# Patient Record
Sex: Male | Born: 1935 | Race: White | Hispanic: No | Marital: Married | Smoking: Former smoker
Health system: Southern US, Community
[De-identification: ages and names within clinical notes are randomized; demographics above are authoritative.]

## PROBLEM LIST (undated history)

## (undated) DIAGNOSIS — F410 Panic disorder [episodic paroxysmal anxiety] without agoraphobia: Secondary | ICD-10-CM

## (undated) DIAGNOSIS — R55 Syncope and collapse: Secondary | ICD-10-CM

## (undated) DIAGNOSIS — N179 Acute kidney failure, unspecified: Secondary | ICD-10-CM

## (undated) DIAGNOSIS — C801 Malignant (primary) neoplasm, unspecified: Secondary | ICD-10-CM

## (undated) DIAGNOSIS — Z860100 Personal history of colon polyps, unspecified: Secondary | ICD-10-CM

## (undated) DIAGNOSIS — R339 Retention of urine, unspecified: Secondary | ICD-10-CM

## (undated) DIAGNOSIS — I513 Intracardiac thrombosis, not elsewhere classified: Secondary | ICD-10-CM

## (undated) DIAGNOSIS — D649 Anemia, unspecified: Secondary | ICD-10-CM

## (undated) DIAGNOSIS — I2510 Atherosclerotic heart disease of native coronary artery without angina pectoris: Secondary | ICD-10-CM

## (undated) DIAGNOSIS — I10 Essential (primary) hypertension: Secondary | ICD-10-CM

## (undated) DIAGNOSIS — N400 Enlarged prostate without lower urinary tract symptoms: Secondary | ICD-10-CM

## (undated) DIAGNOSIS — I5020 Unspecified systolic (congestive) heart failure: Secondary | ICD-10-CM

## (undated) DIAGNOSIS — E785 Hyperlipidemia, unspecified: Secondary | ICD-10-CM

## (undated) DIAGNOSIS — I4891 Unspecified atrial fibrillation: Principal | ICD-10-CM

## (undated) DIAGNOSIS — N281 Cyst of kidney, acquired: Secondary | ICD-10-CM

## (undated) HISTORY — DX: Anemia, unspecified: D64.9

## (undated) HISTORY — PX: CHOLECYSTECTOMY: SHX55

## (undated) HISTORY — DX: Syncope and collapse: R55

## (undated) HISTORY — DX: Hyperlipidemia, unspecified: E78.5

## (undated) HISTORY — DX: Panic disorder (episodic paroxysmal anxiety): F41.0

## (undated) HISTORY — DX: Personal history of colonic polyps: Z86.010

## (undated) HISTORY — DX: Malignant (primary) neoplasm, unspecified: C80.1

## (undated) HISTORY — DX: Essential (primary) hypertension: I10

## (undated) HISTORY — PX: TOTAL KNEE ARTHROPLASTY: SHX125

## (undated) HISTORY — DX: Personal history of colon polyps, unspecified: Z86.0100

## (undated) HISTORY — DX: Benign prostatic hyperplasia without lower urinary tract symptoms: N40.0

---

## 2005-08-21 HISTORY — PX: COLONOSCOPY W/ POLYPECTOMY: SHX1380

## 2005-09-07 ENCOUNTER — Ambulatory Visit: Payer: Self-pay | Admitting: Internal Medicine

## 2005-09-22 ENCOUNTER — Ambulatory Visit: Payer: Self-pay | Admitting: Internal Medicine

## 2005-09-28 ENCOUNTER — Ambulatory Visit: Payer: Self-pay | Admitting: Internal Medicine

## 2005-10-05 ENCOUNTER — Ambulatory Visit: Payer: Self-pay | Admitting: Internal Medicine

## 2005-10-10 ENCOUNTER — Ambulatory Visit: Payer: Self-pay | Admitting: Gastroenterology

## 2005-11-01 ENCOUNTER — Ambulatory Visit: Payer: Self-pay | Admitting: Gastroenterology

## 2005-11-01 ENCOUNTER — Encounter (INDEPENDENT_AMBULATORY_CARE_PROVIDER_SITE_OTHER): Payer: Self-pay | Admitting: Specialist

## 2005-11-03 ENCOUNTER — Ambulatory Visit: Payer: Self-pay | Admitting: Internal Medicine

## 2005-12-22 ENCOUNTER — Ambulatory Visit: Payer: Self-pay | Admitting: Gastroenterology

## 2006-02-07 ENCOUNTER — Ambulatory Visit: Payer: Self-pay | Admitting: Gastroenterology

## 2006-02-28 ENCOUNTER — Ambulatory Visit: Payer: Self-pay | Admitting: Internal Medicine

## 2006-03-29 ENCOUNTER — Ambulatory Visit: Payer: Self-pay | Admitting: Internal Medicine

## 2006-06-04 ENCOUNTER — Ambulatory Visit: Payer: Self-pay | Admitting: Internal Medicine

## 2006-06-04 LAB — CONVERTED CEMR LAB
Basophils Relative: 0.5 % (ref 0.0–1.0)
MCHC: 33.3 g/dL (ref 30.0–36.0)
MCV: 89.1 fL (ref 78.0–100.0)
Monocytes Absolute: 0.1 10*3/uL — ABNORMAL LOW (ref 0.2–0.7)
Neutro Abs: 5.9 10*3/uL (ref 1.4–7.7)
RBC: 4.61 M/uL (ref 4.22–5.81)

## 2006-10-19 DIAGNOSIS — E785 Hyperlipidemia, unspecified: Secondary | ICD-10-CM

## 2006-10-19 DIAGNOSIS — Z860100 Personal history of colon polyps, unspecified: Secondary | ICD-10-CM | POA: Insufficient documentation

## 2006-10-19 DIAGNOSIS — I10 Essential (primary) hypertension: Secondary | ICD-10-CM | POA: Insufficient documentation

## 2006-10-19 DIAGNOSIS — D649 Anemia, unspecified: Secondary | ICD-10-CM | POA: Insufficient documentation

## 2006-10-22 ENCOUNTER — Inpatient Hospital Stay (HOSPITAL_COMMUNITY): Admission: EM | Admit: 2006-10-22 | Discharge: 2006-10-30 | Payer: Self-pay | Admitting: Emergency Medicine

## 2006-10-23 ENCOUNTER — Ambulatory Visit: Payer: Self-pay | Admitting: Internal Medicine

## 2006-10-23 ENCOUNTER — Encounter (INDEPENDENT_AMBULATORY_CARE_PROVIDER_SITE_OTHER): Payer: Self-pay | Admitting: Specialist

## 2006-11-20 ENCOUNTER — Ambulatory Visit: Payer: Self-pay | Admitting: Internal Medicine

## 2006-11-20 LAB — CONVERTED CEMR LAB
Basophils Absolute: 0.1 10*3/uL (ref 0.0–0.1)
Creatinine, Ser: 0.9 mg/dL (ref 0.4–1.5)
Eosinophils Absolute: 0.2 10*3/uL (ref 0.0–0.6)
Eosinophils Relative: 2.1 % (ref 0.0–5.0)
HCT: 36.6 % — ABNORMAL LOW (ref 39.0–52.0)
Hemoglobin: 12.4 g/dL — ABNORMAL LOW (ref 13.0–17.0)
Monocytes Absolute: 1 10*3/uL — ABNORMAL HIGH (ref 0.2–0.7)
Neutrophils Relative %: 76.1 % (ref 43.0–77.0)
Potassium: 3.7 meq/L (ref 3.5–5.1)
Vitamin B-12: 349 pg/mL (ref 211–911)

## 2007-02-04 ENCOUNTER — Telehealth: Payer: Self-pay | Admitting: Internal Medicine

## 2007-09-23 ENCOUNTER — Telehealth: Payer: Self-pay | Admitting: Internal Medicine

## 2008-08-21 HISTORY — PX: MOHS SURGERY: SUR867

## 2009-01-01 ENCOUNTER — Telehealth (INDEPENDENT_AMBULATORY_CARE_PROVIDER_SITE_OTHER): Payer: Self-pay | Admitting: *Deleted

## 2009-01-01 ENCOUNTER — Ambulatory Visit: Payer: Self-pay | Admitting: Internal Medicine

## 2009-01-14 LAB — CONVERTED CEMR LAB
BUN: 17 mg/dL (ref 6–23)
CO2: 32 meq/L (ref 19–32)
Chloride: 109 meq/L (ref 96–112)
GFR calc non Af Amer: 76 mL/min (ref >60)
LDL Cholesterol: 94 mg/dL (ref 0–99)
Sodium: 144 meq/L (ref 135–145)

## 2009-01-15 ENCOUNTER — Encounter (INDEPENDENT_AMBULATORY_CARE_PROVIDER_SITE_OTHER): Payer: Self-pay | Admitting: *Deleted

## 2009-02-03 ENCOUNTER — Ambulatory Visit: Payer: Self-pay | Admitting: Internal Medicine

## 2009-02-03 DIAGNOSIS — R7309 Other abnormal glucose: Secondary | ICD-10-CM | POA: Insufficient documentation

## 2009-02-03 DIAGNOSIS — Z85828 Personal history of other malignant neoplasm of skin: Secondary | ICD-10-CM

## 2009-02-03 DIAGNOSIS — R5383 Other fatigue: Secondary | ICD-10-CM

## 2009-02-03 DIAGNOSIS — R5381 Other malaise: Secondary | ICD-10-CM

## 2009-02-07 LAB — CONVERTED CEMR LAB
Basophils Absolute: 0 10*3/uL (ref 0.0–0.1)
Basophils Relative: 0.2 % (ref 0.0–3.0)
HCT: 40.4 % (ref 39.0–52.0)
Hemoglobin: 13.9 g/dL (ref 13.0–17.0)
Lymphs Abs: 1 10*3/uL (ref 0.7–4.0)
Neutro Abs: 6.1 10*3/uL (ref 1.4–7.7)
Platelets: 145 10*3/uL — ABNORMAL LOW (ref 150.0–400.0)
RBC: 4.52 M/uL (ref 4.22–5.81)
Transferrin: 215 mg/dL (ref 212.0–360.0)
Vitamin B-12: 300 pg/mL (ref 211–911)
WBC: 7.8 10*3/uL (ref 4.5–10.5)

## 2009-02-08 ENCOUNTER — Encounter (INDEPENDENT_AMBULATORY_CARE_PROVIDER_SITE_OTHER): Payer: Self-pay | Admitting: *Deleted

## 2010-01-04 ENCOUNTER — Telehealth (INDEPENDENT_AMBULATORY_CARE_PROVIDER_SITE_OTHER): Payer: Self-pay | Admitting: *Deleted

## 2010-03-08 ENCOUNTER — Ambulatory Visit: Payer: Self-pay | Admitting: Internal Medicine

## 2010-03-08 DIAGNOSIS — R42 Dizziness and giddiness: Secondary | ICD-10-CM

## 2010-03-08 DIAGNOSIS — R279 Unspecified lack of coordination: Secondary | ICD-10-CM | POA: Insufficient documentation

## 2010-03-08 DIAGNOSIS — F29 Unspecified psychosis not due to a substance or known physiological condition: Secondary | ICD-10-CM | POA: Insufficient documentation

## 2010-03-09 ENCOUNTER — Ambulatory Visit: Payer: Self-pay | Admitting: Internal Medicine

## 2010-03-11 ENCOUNTER — Encounter: Payer: Self-pay | Admitting: Internal Medicine

## 2010-03-11 LAB — CONVERTED CEMR LAB
AST: 18 units/L (ref 0–37)
Alkaline Phosphatase: 71 units/L (ref 39–117)
BUN: 13 mg/dL (ref 6–23)
Basophils Relative: 0.3 % (ref 0.0–3.0)
CO2: 26 meq/L (ref 19–32)
Chloride: 109 meq/L (ref 96–112)
Eosinophils Absolute: 0.1 10*3/uL (ref 0.0–0.7)
Eosinophils Relative: 1.1 % (ref 0.0–5.0)
Glucose, Bld: 139 mg/dL — ABNORMAL HIGH (ref 70–99)
HDL: 33.5 mg/dL — ABNORMAL LOW (ref >39.00)
Lymphocytes Relative: 12.6 % (ref 12.0–46.0)
Lymphs Abs: 0.8 10*3/uL (ref 0.7–4.0)
Monocytes Absolute: 0.5 10*3/uL (ref 0.1–1.0)
Neutro Abs: 5 10*3/uL (ref 1.4–7.7)
Neutrophils Relative %: 78.9 % — ABNORMAL HIGH (ref 43.0–77.0)
Platelets: 150 10*3/uL (ref 150.0–400.0)
Triglycerides: 115 mg/dL (ref 0.0–149.0)
VLDL: 23 mg/dL (ref 0.0–40.0)
WBC: 6.4 10*3/uL (ref 4.5–10.5)

## 2010-03-21 ENCOUNTER — Telehealth (INDEPENDENT_AMBULATORY_CARE_PROVIDER_SITE_OTHER): Payer: Self-pay | Admitting: *Deleted

## 2010-03-28 ENCOUNTER — Encounter: Payer: Self-pay | Admitting: Internal Medicine

## 2010-03-29 ENCOUNTER — Encounter: Payer: Self-pay | Admitting: Cardiovascular Disease

## 2010-03-29 ENCOUNTER — Ambulatory Visit: Payer: Self-pay | Admitting: Cardiology

## 2010-03-29 ENCOUNTER — Ambulatory Visit: Payer: Self-pay

## 2010-03-29 ENCOUNTER — Ambulatory Visit (HOSPITAL_COMMUNITY): Admission: RE | Admit: 2010-03-29 | Discharge: 2010-03-29 | Payer: Self-pay | Admitting: Internal Medicine

## 2010-04-18 ENCOUNTER — Telehealth (INDEPENDENT_AMBULATORY_CARE_PROVIDER_SITE_OTHER): Payer: Self-pay | Admitting: *Deleted

## 2010-04-19 ENCOUNTER — Ambulatory Visit: Payer: Self-pay | Admitting: Internal Medicine

## 2010-04-19 DIAGNOSIS — R9431 Abnormal electrocardiogram [ECG] [EKG]: Secondary | ICD-10-CM

## 2010-05-04 ENCOUNTER — Telehealth (INDEPENDENT_AMBULATORY_CARE_PROVIDER_SITE_OTHER): Payer: Self-pay | Admitting: *Deleted

## 2010-05-13 ENCOUNTER — Ambulatory Visit: Payer: Self-pay | Admitting: Cardiology

## 2010-05-19 ENCOUNTER — Telehealth: Payer: Self-pay | Admitting: Cardiology

## 2010-05-20 ENCOUNTER — Telehealth (INDEPENDENT_AMBULATORY_CARE_PROVIDER_SITE_OTHER): Payer: Self-pay | Admitting: *Deleted

## 2010-05-23 ENCOUNTER — Telehealth: Payer: Self-pay | Admitting: Cardiology

## 2010-05-26 ENCOUNTER — Ambulatory Visit: Payer: Self-pay | Admitting: Cardiology

## 2010-05-31 ENCOUNTER — Telehealth (INDEPENDENT_AMBULATORY_CARE_PROVIDER_SITE_OTHER): Payer: Self-pay | Admitting: *Deleted

## 2010-06-20 ENCOUNTER — Telehealth: Payer: Self-pay | Admitting: Cardiology

## 2010-06-21 ENCOUNTER — Ambulatory Visit: Payer: Self-pay | Admitting: Cardiology

## 2010-06-23 ENCOUNTER — Telehealth (INDEPENDENT_AMBULATORY_CARE_PROVIDER_SITE_OTHER): Payer: Self-pay | Admitting: *Deleted

## 2010-06-27 ENCOUNTER — Ambulatory Visit: Payer: Self-pay | Admitting: Internal Medicine

## 2010-06-27 DIAGNOSIS — R351 Nocturia: Secondary | ICD-10-CM

## 2010-06-27 DIAGNOSIS — G4700 Insomnia, unspecified: Secondary | ICD-10-CM | POA: Insufficient documentation

## 2010-06-29 ENCOUNTER — Telehealth: Payer: Self-pay | Admitting: Internal Medicine

## 2010-07-04 ENCOUNTER — Ambulatory Visit: Payer: Self-pay | Admitting: Internal Medicine

## 2010-07-04 DIAGNOSIS — G252 Other specified forms of tremor: Secondary | ICD-10-CM | POA: Insufficient documentation

## 2010-07-04 DIAGNOSIS — F329 Major depressive disorder, single episode, unspecified: Secondary | ICD-10-CM

## 2010-07-04 DIAGNOSIS — F3289 Other specified depressive episodes: Secondary | ICD-10-CM | POA: Insufficient documentation

## 2010-07-04 DIAGNOSIS — G250 Essential tremor: Secondary | ICD-10-CM

## 2010-07-22 ENCOUNTER — Encounter: Payer: Self-pay | Admitting: Internal Medicine

## 2010-07-29 ENCOUNTER — Telehealth: Payer: Self-pay | Admitting: Internal Medicine

## 2010-08-06 ENCOUNTER — Emergency Department (HOSPITAL_COMMUNITY)
Admission: EM | Admit: 2010-08-06 | Discharge: 2010-08-06 | Payer: Self-pay | Source: Home / Self Care | Admitting: Emergency Medicine

## 2010-08-11 ENCOUNTER — Ambulatory Visit: Payer: Self-pay | Admitting: Internal Medicine

## 2010-08-18 ENCOUNTER — Telehealth (INDEPENDENT_AMBULATORY_CARE_PROVIDER_SITE_OTHER): Payer: Self-pay | Admitting: *Deleted

## 2010-08-18 ENCOUNTER — Encounter: Payer: Self-pay | Admitting: Internal Medicine

## 2010-08-30 ENCOUNTER — Encounter: Payer: Self-pay | Admitting: Internal Medicine

## 2010-08-31 ENCOUNTER — Ambulatory Visit
Admission: RE | Admit: 2010-08-31 | Discharge: 2010-08-31 | Payer: Self-pay | Source: Home / Self Care | Attending: Internal Medicine | Admitting: Internal Medicine

## 2010-08-31 ENCOUNTER — Encounter: Payer: Self-pay | Admitting: Internal Medicine

## 2010-09-07 ENCOUNTER — Encounter: Payer: Self-pay | Admitting: Internal Medicine

## 2010-09-22 ENCOUNTER — Telehealth (INDEPENDENT_AMBULATORY_CARE_PROVIDER_SITE_OTHER): Payer: Self-pay | Admitting: *Deleted

## 2010-09-22 NOTE — Progress Notes (Signed)
Summary: Note with Patient Concerns  Note with Patient Concerns   Imported By: Lanelle Bal 09/07/2010 15:04:46  _____________________________________________________________________  External Attachment:    Type:   Image     Comment:   External Document

## 2010-09-22 NOTE — Progress Notes (Signed)
Summary: c/o pain in feet   Phone Note Call from Patient Call back at Home Phone [phone]   Caller: Spouse Reason for Call: Talk to Nurse Summary of Call: per pt calling c/o compression stocking is making matter worse in his feet. pain in his feet.  Initial call taken by: Lorne Skeens,  June 20, 2010 2:02 PM  Follow-up for Phone Call        talked with wife--pt has been using compression stockings for the past 2 days--pt has a history of neuropathy and had some pain in his feet --since he has been using the stockings the pain in his feet has been worse --wife states no change in lightheadedness or weakness--I will review with Dr Graylin Shiver Lankford,RN      Appended Document: c/o pain in feet ok to stop compression stockings given pain.   Appended Document: c/o pain in feet LMTCB   Appended Document: c/o pain in feet discussed with wife by telephone

## 2010-09-22 NOTE — Letter (Signed)
Summary: [facility]  [facility]   Imported By: Lanelle Bal 09/08/2010 11:18:06  _____________________________________________________________________  External Attachment:    Type:   Image     Comment:   External Document

## 2010-09-22 NOTE — Progress Notes (Signed)
Summary: event monitor  Phone Note Outgoing Call Call back at [facility] Phone [phone]   Call placed by: Stanton Kidney, EMT-P,  May 20, 2010 1:50 PM Call placed to: Patient Action Taken: Phone Call Completed Summary of Call: Pt scheduled for event monitor 05/26/10 10am

## 2010-09-22 NOTE — Assessment & Plan Note (Signed)
Summary: np6/ dizziness, guddiness, abn ekg. pt has secure horizone/ gd  Medications Added ASPIRIN 81 MG TBEC (ASPIRIN) Take one tablet by mouth daily GINKGO BILOBA 120 MG CAPS (GINKGO BILOBA) 1 tab every other day RA ARTHRITIS PAIN RELIEF 650 MG CR-TABS (ACETAMINOPHEN) 1 tab by mouth at bedtime TRAZODONE HCL 50 MG TABS (TRAZODONE HCL) take one at bedtime as needed for sleep        Primary Avish Torry:  Dr. Alwyn Ren  CC:  dizziness.  History of Present Illness: 77 yo with history of anxiety presents for evaluation of presyncope.  Patient has had 2 severe episodes. The first occurred on 7/19.  Patient was walking in the mall, came home for lunch, and felt warm/weak and lightheaded while standing in his house.  He sat down and the feeling passed (no syncope).  On 8/23, he was in the mall walking with his wife.  He felt warm and weak, then dizzy/lightheaded.  He had to lie down on the floor and the feeling passed.  No palpitations or sensation of heart racing with either episode.  Since the last episode, he has been afraid to drive or walk in the mall.  It has significantly affected his quality of life.   Patient also reports recently starting to take a second Klonopin tab at night to help him sleep in addition to the one he takes in the morning.  He now feels unsteady and somewhat lightheaded in the morning, which he attributes to the extra Klonopin.  However, he thinks it helps him sleep.  Patient's mood seems depressed.   Patient denies exertional dyspnea or chest pain.  He had been walking in the mall for about 1/2 hour 4-5 times a week with no problem.    We checked his orthostatics today.  His BP dropped significantly when he first stood but then recovered quickly at 2 and 5 minutes.    ECG: NSR, normal  Labs (7/11): K 3.7, creatinine 0.8, HCT 38.3, LDL 101, HDL 33.5, TGs 115, A1c 5.8%  Current Medications (verified): 1)  Doxazosin Mesylate 2 Mg Tabs (Doxazosin Mesylate) .... Take 1/2  Tablet By Mouth Every Day At Bedtime 2)  Clonazepam 0.5 Mg  Tabs (Clonazepam) .[name].. 1 By Mouth Once Daily Prn 3)  Aspirin 81 Mg Tbec (Aspirin) .... Take One Tablet By Mouth Daily 4)  Ginkgo Biloba 120 Mg Caps (Ginkgo Biloba) .[name].. 1 Tab Every Other Day 5)  Ra Arthritis Pain Relief 650 Mg Cr-Tabs (Acetaminophen) .[name].. 1 Tab By Mouth At Bedtime  Allergies: No Known Drug Allergies  Past History:  Past Medical History: 1. Anemia-NOS, PMH of  2. Colonic polyps, hx of 3. Hyperlipidemia 4. Hypertension 5. Skin cancer, hx of, Basal cell  2010, 1989 6. Right TKR 7. BPH 8. Panic attacks/generalized anxiety 9. Presyncope: Echo (8/11) with EF 60%, normal RV size and systolic function, no significant valvular abnormalities.  Carotid dopplers (8/11): minimal carotid stenosis.   Family History: Reviewed history from 02/03/2009 and no changes required. Father: MI @ 30,  CHF S/P TURP @ 38 Mother: d 1 Siblings: bro d 46 CVAs; bro d 29 CAD; bro d 46 lung CA  Social History: Reviewed history from 02/03/2009 and no changes required. Retired Married Former Smoker:quit 1985 Alcohol use-yes:socially Regular exercise-yes: walks 15 min 4-5X/week  Review of Systems       All systems reviewed and negative except as per HPI.   Vital Signs:  Patient profile:   77 year old male Height:  72 inches Weight:      212 pounds BMI:     28.86 Pulse rate:   77 / minute Pulse (ortho):   93 / minute Resp:     16 per minute BP sitting:   151 / 76  (right arm) BP standing:   118 / 68  Vitals Entered By: Kem Parkinson (May 13, 2010 12:07 PM)  Serial Vital Signs/Assessments:  Time      Position  BP       Pulse  Resp  Temp     By           Lying RA  151/76   77                    Kimalexis Barnes           Sitting   149/72   79                    Kimalexis Barnes           Standing  118/68   93                    Kimalexis Barnes   Physical Exam  General:  Well developed, well nourished,  in no acute distress. Head:  normocephalic and atraumatic Nose:  no deformity, discharge, inflammation, or lesions Mouth:  Teeth, gums and palate normal. Oral mucosa normal. Neck:  Neck supple, no JVD. No masses, thyromegaly or abnormal cervical nodes. Lungs:  Clear bilaterally to auscultation and percussion. Heart:  Non-displaced PMI, chest non-tender; regular rate and rhythm, S1, S2 without rubs or gallops. 1/6 SEM upper sternal border.  Carotid upstroke normal, no bruit. Pedals normal pulses. 1+ ankle edema.  Abdomen:  Bowel sounds positive; abdomen soft and non-tender without masses, organomegaly, or hernias noted. No hepatosplenomegaly. Msk:  s/p right TKR Extremities:  No clubbing or cyanosis. Neurologic:  Alert and oriented x 3. Psych:  depressed affect and anxious.     Impression & Recommendations:  Problem # 1:  PRESYNCOPE Patient had 2 episodes of presyncope about a month apart.  Both had a prodrome of warmth/fatigue proceeding to lightheadedness. No palpitations or sensation of racing heart rate.  Each time patient was standing.  He did have hypotension when he initially stood today, but it resolved quickly.  DIfferential for episode includes dehydration/orthostasis, vasovagal, and arrhythmic.  Echo showed no etiology.  I am going to have him wear knee high compression stockings to avoid future orthostatic symptoms.  I will also set him up for a 3-week event monitor to look for arrhythmic events.    Problem # 2:  FATIGUE (ICD-780.79) Patient has been fatigued, groggy, and lightheaded in the morning after taking an extra Klonopin at night. I will have him stop the pm Klonopin and instead try trazodone 50 mg daily.  This is a weak antidepressant and may also help with his mood (has been depressed/anxious).    Other Orders: Event (Event)  Patient Instructions: 1)  Your physician has recommended you make the following change in your medication:  2)  Take Trazodone 50mg  at bedtime  for sleep. 3)  Do not take Clonazepam  at bedtime. 4)  You have a prescription for knee high compression stockings. [facility]  [address]  [place] [phone] 5)  Your physician has recommended that you wear an event monitor.  Event monitors are medical devices that record the heart's electrical activity.  Doctors most often use these monitors to diagnose arrhythmias. Arrhythmias are problems with the speed or rhythm of the heartbeat. The monitor is a small, portable device. You can wear one while you do your normal daily activities. This is usually used to diagnose what is causing palpitations/syncope (passing out). 3 week event monitor 6)  Your physician recommends that you schedule a follow-up appointment in: 4-5 weeks after you have completed the monitor. Prescriptions: TRAZODONE HCL 50 MG TABS (TRAZODONE HCL) take one at bedtime as needed for sleep  #30 x 6   Entered by:   Katina Dung, RN, BSN   Authorized by:   [name], MD   Signed by:   Katina Dung, RN, BSN on 05/13/2010   Method used:   [facility]        [facility]  [address] [phone][facility] (retail)       [address]       [place], [place]  [address]       Ph: [phone] or [phone]       Fax: [phone]   RxID:   [id number]

## 2010-09-22 NOTE — Assessment & Plan Note (Signed)
Summary: unable to sleep.cbs   Vital Signs:  Patient profile:   77 year old male Weight:      207 pounds BMI:     28.18 Pulse rate:   72 / minute Resp:     18 per minute BP sitting:   130 / 72  (left arm) Cuff size:   regular  Vitals Entered By: Shonna Chock CMA (July 04, 2010 11:20 AM) CC: Patient stopped Fluoxetine and stated " I had the worse weekend of my life" patient became very depressed while taking med , Depressive symptoms   Primary Care Provider:  Dr. Alwyn Ren  CC:  Patient stopped Fluoxetine and stated " I had the worse weekend of my life" patient became very depressed while taking med  and Depressive symptoms.  History of Present Illness: Depressive Symptoms      This is an 77 year old man who presents with Depressive symptoms.  The patient reports depressed mood, loss of interest/pleasure, insomnia, and psychomotor retardation.  The patient also reports fatigue or loss of energy and thoughts of suicide.  The patient denies feelings of worthlessness, diminished concentration, indecisiveness, and suicidal plans.  The patient reports the following psychosocial stressors: recent traumatic event   (his wife was in MVA ) and major life changes( he was stressed by Cardiology evaluation).  Patient's past history includes depression  in 1965 due to work stress, he saw Dr Levon Hedger > 1 year.He was on Valium & ? In 1996 he was depressed after knee surgery ; he took Prozac X 2 months.His brother had  alcoholism.  The patient denies abnormally elevated mood, abnormally irritable mood, and flight of ideas.  He fills Fluoxetine caused depression & panic.   Current Medications (verified): 1)  Doxazosin Mesylate 2 Mg Tabs (Doxazosin Mesylate) .... Take 1/2 Tablet By Mouth Every Day At Bedtime 2)  Clonazepam 0.5 Mg  Tabs (Clonazepam) .[name].. 1- 2  By Mouth  As Needed 3)  Aspirin 81 Mg Tbec (Aspirin) .... Take One Tablet By Mouth Daily 4)  Ginkgo Biloba 120 Mg Caps (Ginkgo Biloba) .[name].. 1 Tab  Every Other Day 5)  Ra Arthritis Pain Relief 650 Mg Cr-Tabs (Acetaminophen) .[name].. 1 Tab By Mouth At Bedtime  Allergies (verified): 1)  ! Fluoxetine Hcl (Fluoxetine Hcl)  Physical Exam  General:  Anxious but in no acute distress; cooperative throughout examination Neck:  No deformities, masses, or tenderness noted. Heart:  normal rate, regular rhythm, no gallop, no rub, no JVD, and grade 1 /6 systolic murmur.   Neurologic:  Oriented X3.  Marked tremor with writing. DTRs WNL UE Psych:  memory intact for recent and remote but  flat affect, and subdued.   Bipolar screening test answered negative to all 13 questions.MMSE : 29 out of 30. Clock test had "Xs" @ 10 & 20 , but no hands   Impression & Recommendations:  Problem # 1:  INSOMNIA (ICD-780.52)  Problem # 2:  DEPRESSION, RECURRENT (ICD-311)  The following medications were removed from the medication list:    Fluoxetine Hcl 10 Mg Caps (Fluoxetine hcl) .[name]... 1 once daily His updated medication list for this problem includes:    Clonazepam 0.5 Mg Tabs (Clonazepam) .[name]... 1 each am & 1-2 at bedtime as needed  Orders: Venipuncture ([address]) TLB-TSH (Thyroid Stimulating Hormone) (84443-TSH)  Problem # 3:  INTENTION TREMOR (ICD-333.1)  Complete Medication List: 1)  Doxazosin Mesylate 2 Mg Tabs (Doxazosin mesylate) .... Take 1/2 tablet by mouth every day at bedtime 2)  Clonazepam 0.5 Mg Tabs (Clonazepam) .[name].. 1 each am & 1-2 at bedtime as needed 3)  Aspirin 81 Mg Tbec (Aspirin) .... Take one tablet by mouth daily 4)  Ginkgo Biloba 120 Mg Caps (Ginkgo biloba) .[name].. 1 tab every other day 5)  Ra Arthritis Pain Relief 650 Mg Cr-tabs (Acetaminophen) .[name].. 1 tab by mouth at bedtime  Patient Instructions: 1)  If you have suicidal thoughts please go to [facility] ER for evlaution by ACT Team. Prescriptions: CLONAZEPAM 0.5 MG  TABS (CLONAZEPAM) 1 each am & 1-2 at bedtime as needed  #90 x 0   Entered and Authorized by:   Marga Melnick MD    Signed by:   Marga Melnick MD on 07/04/2010   Method used:   Print then Give to Patient   RxID:   [id number]    Orders Added: 1)  Est. Patient Level IV [14782] 2)  Venipuncture [95621] 3)  TLB-TSH (Thyroid Stimulating Hormone) [30865-HQI]

## 2010-09-22 NOTE — Progress Notes (Signed)
Summary: CLONAZEPAM REFILL  Phone Note Refill Request Message from:  Fax from Pharmacy on May 31, 2010 12:26 PM  Refills Requested: Medication #1:  CLONAZEPAM 0.5 MG  TABS 1 by mouth once daily prn   [facility] [place] [place]     [place] [phone]  Initial call taken by: Jerolyn Shin,  May 31, 2010 12:26 PM    Prescriptions: CLONAZEPAM 0.5 MG  TABS (CLONAZEPAM) 1 by mouth once daily prn  #60 x 0   Entered by:   Shonna Chock CMA   Authorized by:   Marga Melnick MD   Signed by:   Shonna Chock CMA on 05/31/2010   Method used:   Printed then faxed to ...       [facility]  [address] [phone]* (retail)       [address]       [place], [place]  [address]       Ph: [phone] or [phone]       Fax: [phone]   RxID:   [id number]

## 2010-09-22 NOTE — Assessment & Plan Note (Signed)
Summary: dizzy spells//lch   Vital Signs:  Patient profile:   77 year old male Weight:      207.6 pounds Temp:     98.4 degrees F oral Pulse rate:   80 / minute Resp:     17 per minute BP sitting:   142 / 80  (left arm) Cuff size:   large  Vitals Entered By: Shonna Chock CMA (April 19, 2010 11:22 AM) CC: 1.) Dizzy Spells (ongoing since 03/08/10), patient seen Cardiology and everything checked ok  2.) Major issus with sleep, Syncope   CC:  1.) Dizzy Spells (ongoing since 03/08/10), patient seen Cardiology and everything checked ok  2.) Major issus with sleep, and Syncope.  History of Present Illness:      This is an 77 year old man who presents with  paroxysmal lightheadedness.  The patient reports lightheadedness, but denies loss of consciousness, premonitory symptoms, near loss of consciousness, palpitations, chest pain, shortness of breath, seizures, and incontinence.  Associated symptoms include focal weakness, "my L knee buckled".  The patient denies the following symptoms: headache, abdominal discomfort, nausea, vomiting, feeling warm, pallor, diaphoresis, blurred vision, perioral numbness, bite injury of tongue, and witnessed confusion as symptoms resolved after sitting bench for < 5 min.   The patient reports the following precipitating factors: none noted.  The lighthreadedness  occurred in the setting of  walking through the mall. The episode 03/08/2010 was associated with feeling warm, fatigue & weakness R thumb & index finger for 3-5 min w/o lightheadedness. He had 2D ECHO & carotid Dopplers after  the episode  in 02/2010. Rx: 4  baby ASA once daily .  Current Medications (verified): 1)  Doxazosin Mesylate 2 Mg Tabs (Doxazosin Mesylate) .... Take 1/2 Tablet By Mouth Every Day At Bedtime 2)  Clonazepam 0.5 Mg  Tabs (Clonazepam) .[name].. 1 By Mouth Once Daily Prn 3)  Benadryl 25 Mg Tabs (Diphenhydramine Hcl) .[name].. 1 By Mouth At Bedtime  Allergies (verified): No Known Drug  Allergies  Review of Systems General:  Complains of sleep disorder; Using OTC sleep aid. Neuro:  Denies brief paralysis, inability to speak, sensation of room spinning, and tremors. Psych:  Complains of anxiety; Anxiety since event in 02/2010; he has quit driving due to this .  Physical Exam  General:  Appears younger than age,in no acute distress; alert,appropriate and cooperative throughout examination Eyes:  No corneal or conjunctival inflammation noted. EOMI. Perrla. Field of Vision grossly normal. Mouth:  Oral mucosa and oropharynx without lesions or exudates.  Teeth in good repair. No tongue deviation Lungs:  Normal respiratory effort, chest expands symmetrically. Lungs are clear to auscultation, no crackles or wheezes. Heart:  normal rate, regular rhythm, no murmur, no gallop, no rub, and no JVD.   Extremities:  Brace on L knee Neurologic:  alert & oriented X3, cranial nerves II-XII intact, strength normal in all extremities, sensation intact to light touch over face , gait  broad & cane employed, DTRs symmetrical  but )+ @ kneesl, finger-to-nose normal, and Romberg negative except for intentention  tremor   Skin:  Solar & keratotic changes Psych:  Oriented X3, memory intact for recent and remote, normally interactive, good eye contact, not anxious appearing, and not depressed appearing.     Impression & Recommendations:  Problem # 1:  DIZZINESS AND GIDDINESS (ICD-780.4)  His updated medication list for this problem includes:    Benadryl 25 Mg Tabs (Diphenhydramine hcl) .[name]... 1 by mouth at bedtime  Orders: Cardiology  Referral (Cardiology)  Problem # 2:  ABNORMAL ELECTROCARDIOGRAM (ICD-794.31)  R/O ectopic SVT as etiology of paroxysmal events  Orders: Cardiology Referral (Cardiology)  Complete Medication List: 1)  Doxazosin Mesylate 2 Mg Tabs (Doxazosin mesylate) .... Take 1/2 tablet by mouth every day at bedtime 2)  Clonazepam 0.5 Mg Tabs (Clonazepam) .[name].. 1 by mouth  once daily prn 3)  Benadryl 25 Mg Tabs (Diphenhydramine hcl) .[name].. 1 by mouth at bedtime  Patient Instructions: 1)  Clonazepam 0.5 mg 1-2 at bedtime as needed .

## 2010-09-22 NOTE — Assessment & Plan Note (Signed)
Summary: HAVING TROUBLE SLEEPING/KN   Vital Signs:  Patient profile:   77 year old male Weight:      200.6 pounds BMI:     27.30 Temp:     98.2 degrees F oral Pulse rate:   72 / minute Resp:     15 per minute BP sitting:   134 / 78  (left arm) Cuff size:   regular  Vitals Entered By: Shonna Chock CMA (August 11, 2010 3:14 PM) CC: 1.) Sleep concerns   2.) Mild depression , Insomnia, Depressive symptoms   Primary Care Provider:  Dr. Alwyn Ren  CC:  1.) Sleep concerns   2.) Mild depression , Insomnia, and Depressive symptoms.  History of Present Illness: Insomnia      This is an 77 year old man who presents with Insomnia.  The patient reports frequent awakening due to nocturia . This was 3-4X / night ;it decreased to 2-3 X with meds but it caused constipation.He  denies difficulty falling asleep, nightmares, leg movements, snoring, and apnea noted by partner.   Depressive Symptoms      The patient also presents with Depressive symptoms.  The patient reports depressed mood and loss of interest/pleasure.  The patient denies abnormally elevated mood and abnormally irritable mood.    Current Medications (verified): 1)  Doxazosin Mesylate 2 Mg Tabs (Doxazosin Mesylate) .... Take 1/2 Tablet By Mouth Every Day At Bedtime 2)  Clonazepam 0.5 Mg  Tabs (Clonazepam) .[name].. 1 Each Am & 1-2 At Bedtime As Needed 3)  Aspirin 81 Mg Tbec (Aspirin) .... Take One Tablet By Mouth Daily 4)  Ginkgo Biloba 120 Mg Caps (Ginkgo Biloba) .[name].. 1 Tab Every Other Day 5)  Ra Arthritis Pain Relief 650 Mg Cr-Tabs (Acetaminophen) .[name].. 1 Tab By Mouth At Bedtime  Allergies: 1)  ! Fluoxetine Hcl (Fluoxetine Hcl)  Physical Exam  General:  Appears younger than age ,in no acute distress;  Neck:  No deformities, masses, or tenderness noted. Heart:  Normal rate and regular rhythm. S1 and S2 normal without gallop, murmur, click, rub.S4 , rare premature Neurologic:  alert & oriented X3 and DTRs symmetrical  @  biceps   Impression & Recommendations:  Problem # 1:  NOCTURIA (ZOX-096.04) with sleep disruption  Problem # 2:  DEPRESSION, RECURRENT (ICD-311)  intolerant to Fluoxetine His updated medication list for this problem includes:    Clonazepam 0.5 Mg Tabs (Clonazepam) .[name]... 1 each am & 1-2 at bedtime as needed    Bupropion Hcl 75 Mg Tabs (Bupropion hcl) .[name]... 1 once daily  Orders: Prescription Created Electronically [phone])  Complete Medication List: 1)  Doxazosin Mesylate 2 Mg Tabs (Doxazosin mesylate) .... Take 1/2 tablet by mouth every day at bedtime 2)  Clonazepam 0.5 Mg Tabs (Clonazepam) .[name].. 1 each am & 1-2 at bedtime as needed 3)  Aspirin 81 Mg Tbec (Aspirin) .... Take one tablet by mouth daily 4)  Ginkgo Biloba 120 Mg Caps (Ginkgo biloba) .[name].. 1 tab every other day 5)  Ra Arthritis Pain Relief 650 Mg Cr-tabs (Acetaminophen) .[name].. 1 tab by mouth at bedtime 6)  Bupropion Hcl 75 Mg Tabs (Bupropion hcl) .[name].. 1 once daily  Patient Instructions: 1)  Assess response to new medication. See Dr Vonita Moss for possible medication change. Consider  Health  Care POA . Prescriptions: BUPROPION HCL 75 MG TABS (BUPROPION HCL) 1 once daily  #30 x 2   Entered and Authorized by:   Marga Melnick MD   Signed by:   Chrissie Noa  Hopper MD on 08/11/2010   Method used:   [facility] to ...       [facility]  [address] [phone]* (retail)       [address]       [place], [place]  [address]       Ph: [phone] or [phone]       Fax: [phone]   RxID:   [id number]    Orders Added: 1)  Est. Patient Level III [76283] 2)  Prescription Created Electronically [phone]

## 2010-09-22 NOTE — Progress Notes (Signed)
Summary: Refill Request  Phone Note Refill Request Call back at [phone] Message from:  Pharmacy on Jan 04, 2010 8:15 AM  Refills Requested: Medication #1:  CLONAZEPAM 0.5 MG  TABS 1 by mouth once daily prn.   Dosage confirmed as above?Dosage Confirmed   Supply Requested: 3 months   [facility] [address]   Next Appointment Scheduled: none Initial call taken by: Harold Barban,  Jan 04, 2010 8:15 AM    Prescriptions: CLONAZEPAM 0.5 MG  TABS (CLONAZEPAM) 1 by mouth once daily prn  #60 x 0   Entered by:   Shonna Chock   Authorized by:   Marga Melnick MD   Signed by:   Shonna Chock on 01/04/2010   Method used:   Printed then faxed to ...       [facility]  [address] [phone]* (retail)       [address]       [place], [place]  [address]       Ph: [phone] or [phone]       Fax: [phone]   RxID:   [id number]

## 2010-09-22 NOTE — Miscellaneous (Signed)
Summary: Orders Update   Clinical Lists Changes  Orders: Added new Referral order of Doppler Referral (Doppler) - Signed Added new Referral order of Echo Referral (Echo) - Signed

## 2010-09-22 NOTE — Letter (Signed)
Summary: [facility]  [facility]   Imported By: Lanelle Bal 08/29/2010 09:52:00  _____________________________________________________________________  External Attachment:    Type:   Image     Comment:   External Document

## 2010-09-22 NOTE — Progress Notes (Signed)
Summary: Clonazepam Concerns  Phone Note Refill Request Message from:  Fax from Pharmacy on June 23, 2010 12:45 PM  Refills Requested: Medication #1:  CLONAZEPAM 0.5 MG  TABS 1 by mouth once daily prn   [facility]  [address] ,  fax  [phone]      requests qty = 60    directions on fax state--take 1 tablet by mouth once dailty as needed----???needs 60 @ 1 per day --just filled less than one month ago??  Initial call taken by: Jerolyn Shin,  June 23, 2010 12:48 PM  Follow-up for Phone Call        I called patient to clarify how he is taking med and he indicated 1 am, 2 at bedtime for sleep and Dr.Hopper ok'd for him to take med this way.   Dr.Hopper please advise if you changed med instruction Follow-up by: Shonna Chock CMA,  June 24, 2010 4:29 PM  Additional Follow-up for Phone Call Additional follow up Details #1::        Because of near sycope , I recommend ONLY at bedtime as needed only  Additional Follow-up by: Marga Melnick MD,  June 24, 2010 5:46 PM    Additional Follow-up for Phone Call Additional follow up Details #2::    I spoke with patient's wife and gave Dr.Hopper's instruction, she indicated that her husband needs more than 1 by mouth once daily to sleep. I indicated Dr.Hopper's reason for having patient take 1 by mouth once daily and appointment to be schedule if they need to futher discuss.  Wife ok'd and indicated she will discuss with her husband and call back if needed. Follow-up by: Shonna Chock CMA,  June 27, 2010 8:56 AM

## 2010-09-22 NOTE — Progress Notes (Signed)
Summary: CLONAZEPAM REFILL  Phone Note Refill Request Message from:  Fax from Pharmacy on May 04, 2010 12:57 PM  Refills Requested: Medication #1:  CLONAZEPAM 0.5 MG  TABS 1 by mouth once daily prn   [facility]   [address] [facility] [phone]    QTY 60  Initial call taken by: Jerolyn Shin,  May 04, 2010 12:58 PM    Prescriptions: CLONAZEPAM 0.5 MG  TABS (CLONAZEPAM) 1 by mouth once daily prn  #60 x 0   Entered by:   Shonna Chock CMA   Authorized by:   Marga Melnick MD   Signed by:   Shonna Chock CMA on 05/04/2010   Method used:   Printed then faxed to ...       [facility]  [address] [phone]* (retail)       [address]       [place], [place]  [address]       Ph: [phone] or [phone]       Fax: [phone]   RxID:   [id number]

## 2010-09-22 NOTE — Progress Notes (Signed)
Summary: no BM in 3 days  Phone Note Call from Patient   Caller: Patient Summary of Call: Pt wife left VM that Pt has not had BM in 3 days. Pt was given miralax and magnesium citrate from pharmacy and still has not had any result. Pt wife denies that Pt has any abdominal swelling, tenderness or pain. Pt only complaint is that he has not had BM in 3 days.. Pls advise.........Marland KitchenFelecia Deloach CMA  July 29, 2010 4:30 PM   Follow-up for Phone Call        clear liquids X 24-48 hrs . Fleet's once daily X 2 as needed . If pain , swelling or  fever occur  go to ER Follow-up by: Marga Melnick MD,  July 29, 2010 4:54 PM  Additional Follow-up for Phone Call Additional follow up Details #1::        Discuss with patient wife...........Marland KitchenFelecia Deloach CMA  July 29, 2010 5:06 PM

## 2010-09-22 NOTE — Assessment & Plan Note (Signed)
Summary: discuss med /cbs   Vital Signs:  Patient profile:   77 year old male Weight:      209.6 pounds BMI:     28.53 Pulse rate:   72 / minute Resp:     17 per minute BP sitting:   138 / 80  (left arm) Cuff size:   large  Vitals Entered By: Shonna Chock CMA (June 27, 2010 4:13 PM) CC: Discuss Clonazepam and sleep concerns, Insomnia   Primary Care Provider:  Dr. Alwyn Ren  CC:  Discuss Clonazepam and sleep concerns and Insomnia.  History of Present Illness: Insomnia      This is an 77 year old man who presents with Insomnia for 5 years.  The patient reports frequent awakening (to go to BR, nocturia > 2X/night, PMH of BPH ), early awakening, and daytime somnolence, but denies difficulty falling asleep, nightmares, leg movements, snoring,  or  apnea noted by partner.  Associated symptoms do not  include weight gain and morning  headache.  Insomnia has not  led to problems with memory loss and impaired judgement. He is  not  reading in bed or watching TV in bed, but he does take  daytime naps.  Past treatments that have been effective include sedatives, Clonazepam. Two Clonazepam allows ? 8 hrs sleep. Trazadone 50 mg Rxed by Dr Shirlee Latch provided only 6 hrs/ night. Dr Alford Highland notes assessing pre Syncope reviewed .  Current Medications (verified): 1)  Doxazosin Mesylate 2 Mg Tabs (Doxazosin Mesylate) .... Take 1/2 Tablet By Mouth Every Day At Bedtime 2)  Clonazepam 0.5 Mg  Tabs (Clonazepam) .[name].. 1 By Mouth Once Daily At Bedtime 3)  Aspirin 81 Mg Tbec (Aspirin) .... Take One Tablet By Mouth Daily 4)  Ginkgo Biloba 120 Mg Caps (Ginkgo Biloba) .[name].. 1 Tab Every Other Day 5)  Ra Arthritis Pain Relief 650 Mg Cr-Tabs (Acetaminophen) .[name].. 1 Tab By Mouth At Bedtime  Allergies (verified): No Known Drug Allergies  Physical Exam  General:  Unshaven,in no acute distress; alert,appropriate and cooperative throughout examination Mouth:  Oral mucosa and oropharynx without lesions or exudates.   Teeth in good repair. Oropharynx not crowded Lungs:  Normal respiratory effort, chest expands symmetrically. Lungs are clear to auscultation, no crackles or wheezes. Heart:  Normal rate and regular rhythm. S1 and S2 normal without gallop, murmur, click, rub. S4 Prostate:  Prostate gland firm and smooth, no enlargement, nodularity, tenderness, mass, asymmetry or induration. Neurologic:  alert & oriented X3.   Cervical Nodes:  No lymphadenopathy noted Axillary Nodes:  No palpable lymphadenopathy Psych:  memory intact for recent and remote, normally interactive, and good eye contact.     Impression & Recommendations:  Problem # 1:  INSOMNIA (ICD-780.52)  Problem # 2:  NOCTURIA (BJY-782.95)  Orders: Urology Referral (Urology)  Complete Medication List: 1)  Doxazosin Mesylate 2 Mg Tabs (Doxazosin mesylate) .... Take 1/2 tablet by mouth every day at bedtime 2)  Clonazepam 0.5 Mg Tabs (Clonazepam) .[name].. 1- 2  by mouth  as needed 3)  Aspirin 81 Mg Tbec (Aspirin) .... Take one tablet by mouth daily 4)  Ginkgo Biloba 120 Mg Caps (Ginkgo biloba) .[name].. 1 tab every other day 5)  Ra Arthritis Pain Relief 650 Mg Cr-tabs (Acetaminophen) .[name].. 1 tab by mouth at bedtime  Patient Instructions: 1)  Stop daytime naps. Prescriptions: CLONAZEPAM 0.5 MG  TABS (CLONAZEPAM) 1- 2  by mouth  as needed  #60 x 2   Entered and Authorized by:  Marga Melnick MD   Signed by:   Marga Melnick MD on 06/27/2010   Method used:   Print then Give to Patient   RxID:   [id number]    Orders Added: 1)  Est. Patient Level IV [56213] 2)  Urology Referral [Urology]

## 2010-09-22 NOTE — Progress Notes (Signed)
Summary: discuss medication / something to help him sleep   Phone Note Call from Patient Call back at Home Phone [phone]   Caller: Spouse- jean  Reason for Call: Talk to Doctor Summary of Call: per pt wife calling, discuss medication , also something to help him sleep. Initial call taken by: Lorne Skeens,  May 23, 2010 3:10 PM  Follow-up for Phone Call        talked with wife--wife concerned because pt still not sleeping all night --wife states he goes to bed around 9-9:30pm  and wakes up at 4am unable to go back to sleep--he does take  a nap in the afternoon because he feels tired--wife asking for further recommendations for sleep--I will review with Dr Orvis Brill with Dr Darvin Neighbours recommended pt discuss options for sleep with Dr Luan Moore is aware of Dr Alford Highland recommendation

## 2010-09-22 NOTE — Miscellaneous (Signed)
Summary: Orders Update  Clinical Lists Changes  Orders: Added new Test order of Carotid Duplex (Carotid Duplex) - Signed 

## 2010-09-22 NOTE — Progress Notes (Signed)
Summary: REFILL REQUEST  Phone Note Refill Request Call back at [phone] Message from:  Pharmacy on March 21, 2010 8:40 AM  Refills Requested: Medication #1:  CLONAZEPAM 0.5 MG  TABS 1 by mouth once daily prn.   Dosage confirmed as above?Dosage Confirmed   Supply Requested: 1 month   [facility]: [facility] [address]  Initial call taken by: Lavell Islam,  March 21, 2010 8:41 AM    Prescriptions: CLONAZEPAM 0.5 MG  TABS (CLONAZEPAM) 1 by mouth once daily prn  #60 x 0   Entered by:   Shonna Chock CMA   Authorized by:   Marga Melnick MD   Signed by:   Shonna Chock CMA on 03/21/2010   Method used:   Printed then faxed to ...       [facility]  [address] [phone]* (retail)       [address]       [place], [place]  [address]       Ph: [phone] or [phone]       Fax: [phone]   RxID:   [id number]

## 2010-09-22 NOTE — Letter (Signed)
Summary: Appt Scheduled/[facility]  Appt Scheduled/[facility]   Imported By: Lanelle Bal 09/13/2010 11:46:03  _____________________________________________________________________  External Attachment:    Type:   Image     Comment:   External Document

## 2010-09-22 NOTE — Assessment & Plan Note (Signed)
Summary: [name]      Allergies Added: NKDA  Primary Provider:  Dr. Alwyn Ren   History of Present Illness: 77 yo with history of anxiety presented initially for evaluation of presyncope.  Patient has had 2 severe episodes. The first occurred on 7/19.  Patient was walking in the mall, came home for lunch, and felt warm/weak and lightheaded while standing in his house.  He sat down and the feeling passed (no syncope).  On 8/23, he was in the mall walking with his wife.  He felt warm and weak, then dizzy/lightheaded.  He had to lie down on the floor and the feeling passed.  No palpitations or sensation of heart racing with either episode.  Since the last episode, he has been afraid to drive or walk in the mall.  It has significantly affected his quality of life.   Since last appointment, patient has had no further presyncopal episodes.  He tried compression stockings but they caused significant pain in his feet so he had to stop them.  He has occasional "panic attacks" that resolve with use of clonazepam. He is walking in his house without lightheadedness or dyspnea but is still scared to go back to the mall.   3 week event monitor showed that the HR dipped into the 40s occasionally at night, suspect while he was sleeping (never patient-triggered).  No other significant events.   Labs (7/11): K 3.7, creatinine 0.8, HCT 38.3, LDL 101, HDL 33.5, TGs 115, A1c 5.8%   Current Medications (verified): 1)  Doxazosin Mesylate 2 Mg Tabs (Doxazosin Mesylate) .... Take 1/2 Tablet By Mouth Every Day At Bedtime 2)  Clonazepam 0.5 Mg  Tabs (Clonazepam) .[name].. 1 By Mouth Once Daily Prn 3)  Aspirin 81 Mg Tbec (Aspirin) .... Take One Tablet By Mouth Daily 4)  Ginkgo Biloba 120 Mg Caps (Ginkgo Biloba) .[name].. 1 Tab Every Other Day 5)  Ra Arthritis Pain Relief 650 Mg Cr-Tabs (Acetaminophen) .[name].. 1 Tab By Mouth At Bedtime  Allergies (verified): No Known Drug Allergies  Past History:  Past Medical History: 1.  Anemia-NOS, PMH of  2. Colonic polyps, hx of 3. Hyperlipidemia 4. Hypertension 5. Skin cancer, hx of, Basal cell  2010, 1989 6. Right TKR 7. BPH 8. Panic attacks/generalized anxiety 9. Presyncope: Echo (8/11) with EF 60%, normal RV size and systolic function, no significant valvular abnormalities.  Carotid dopplers (8/11): minimal carotid stenosis.  Event monitor (10/11) for 3 wks: episodes of sinus bradycardia with heart rate to low 40s occasionally at night (suspect while sleeping).  No other significant events.   Family History: Reviewed history from 02/03/2009 and no changes required. Father: MI @ 73,  CHF S/P TURP @ 30 Mother: d 61 Siblings: bro d 65 CVAs; bro d 67 CAD; bro d 85 lung CA  Social History: Reviewed history from 02/03/2009 and no changes required. Retired Married Former Smoker:quit 1985 Alcohol use-yes:socially Regular exercise-yes: walks 15 min 4-5X/week  Vital Signs:  Patient profile:   77 year old male Height:      72 inches Weight:      211 pounds BMI:     28.72 Pulse rate:   66 / minute Resp:     16 per minute BP sitting:   142 / 78  (left arm)  Vitals Entered By: Marrion Coy, CNA (June 21, 2010 3:00 PM)  Physical Exam  General:  Somewhat frail elderly man.  Neck:  Neck supple, no JVD. No masses, thyromegaly or abnormal cervical nodes.  Lungs:  Clear bilaterally to auscultation and percussion. Heart:  Non-displaced PMI, chest non-tender; regular rate and rhythm, S1, S2 without rubs or gallops. 1/6 SEM upper sternal border.  Carotid upstroke normal, no bruit. Pedals normal pulses. No edema.  Abdomen:  Bowel sounds positive; abdomen soft and non-tender without masses, organomegaly, or hernias noted. No hepatosplenomegaly. Extremities:  No clubbing or cyanosis. Neurologic:  Alert and oriented x 3. Psych:  Normal affect.   Impression & Recommendations:  Problem # 1:  PRESYNCOPE Patient had 2 episodes of presyncope about a month apart.  Both  had a prodrome of warmth/fatigue proceeding to lightheadedness. No palpitations or sensation of racing heart rate.  Each time patient was standing.  Echo showed no etiology.  He did not tolerate compression stockings.  Event monitor for 3 weeks showed no significant arrhythmia.  Presyncopal events may have been vasovagal, but also could have been related to orthostatic intolerance from autonomic insufficiency or dehydration.  When we checked his orthostatics at last appointment, he dropped his BP immediately on standing but it rose back to normal quickly with prolonged standing.  I have warned him to stand slowly from a seated position while holding onto something.  He is not having any significant "dizzy" symptoms at this time.  I think that he can go back to the mall to walk if his wife goes with him.  If he has further episodes, could try pyridostigmine for orthostatic intolerance in the setting of supine hypertension.   Patient Instructions: 1)  Your physician recommends that you schedule a follow-up appointment as needed 2)  Your physician recommends that you continue on your current medications as directed. Please refer to the Current Medication list given to you today.

## 2010-09-22 NOTE — Assessment & Plan Note (Signed)
Summary: dizzy,fatigue/cbs pt will wait   Vital Signs:  Patient profile:   77 year old male Weight:      210.8 pounds O2 Sat:      96 % Temp:     97.7 degrees F oral Pulse rate:   74 / minute Resp:     16 per minute BP supine:   122 / 76  (left arm) BP sitting:   122 / 84  (left arm) BP standing:   130 / 86  (left arm) Cuff size:   large  Vitals Entered By: Shonna Chock CMA (March 08, 2010 4:50 PM) CC: Dizzy, fatigue and problems controlling fingers since yesterday, Syncope Comments Pulse supine: 88 Pulse standing: 76   CC:  Dizzy, fatigue and problems controlling fingers since yesterday, and Syncope.  History of Present Illness:      This is an 77 year old man who presents with  lightheadedness which occurred  10 min after lunch today.  The patient reports lightheadedness, but denies premonitory symptoms, near loss of consciousness, palpitations, chest pain, shortness of breath, and incontinence.  Associated symptoms included  feeling warm.  The patient denies the following symptoms: headache, abdominal discomfort, nausea, vomiting, pallor, diaphoresis, focal weakness, blurred vision,  or perioral numbness.His wife states she felt he had some confusion  & slight dyscoordination of his hands.  The patient reports the following possible  precipitating factors: emotional distress. His car  took an extended time to be  repaired. He has had multiple sleepness nights  waiting return of his automobile. The stress was depending on others for travel.  The  symptoms  occurred in the setting of standing position & lasted 10-15 min.   On 3 -4 baby ASA once daily for arthritis.  Current Medications (verified): 1)  Doxazosin Mesylate 2 Mg Tabs (Doxazosin Mesylate) .... Take 1/2 Tablet By Mouth Every Day At Bedtime 2)  Clonazepam 0.5 Mg  Tabs (Clonazepam) .[name].. 1 By Mouth Once Daily Prn  Allergies (verified): No Known Drug Allergies  Physical Exam  General:  Appears younger than 77,well-nourished,in no acute distress; alert,appropriate and cooperative throughout examination,well-nourished,in no acute distress; alert,appropriate and cooperative throughout examination Eyes:  No corneal or conjunctival inflammation noted. EOMI. Perrla. Field of  Vision grossly normal. Arcus senilis bilaterally Ears:  wax bilaterally; decreased hearing Nose:  Lesion  over bridge of nose suggestive of recurrent cancer("I haven't taken time to follow up with doctor who treated it before") Mouth:  Oral mucosa and oropharynx without lesions or exudates.  Teeth in good repair. No tongue deviation Lungs:  Normal respiratory effort, chest expands symmetrically. Lungs are clear to auscultation, no crackles or wheezes. Heart:  normal rate, regular rhythm, no gallop, no rub, no JVD, and grade 1 /6 systolic murmur.   Pulses:  R and L carotid pulses are full and equal bilaterally w/o bruits Extremities:  Valgus LE deformity;soft knee support  worn Neurologic:  alert & oriented X3, cranial nerves II-XII intact, strength normal in all extremities, sensation intact to light touch, gait  shuffling due to knee issues, DTRs symmetrical  but decreased @ knees, finger-to-nose normal except for slight  temor, and Romberg negative.   Skin:  See nose Cervical Nodes:  No lymphadenopathy noted Axillary Nodes:  No palpable lymphadenopathy Psych:  memory intact for recent and remote, normally interactive, good eye contact, and not anxious appearing.     Impression & Recommendations:  Problem # 1:  DIZZINESS AND GIDDINESS (ICD-780.4)  Problem # 2:  MENTAL CONFUSION (ICD-298.9) < 30 min ;  R/O TIA  Problem # 3:  LACK OF COORDINATION (ICD-781.3) in context of #1 & #2   Complete Medication List: 1)  Doxazosin Mesylate 2 Mg Tabs (Doxazosin mesylate) .... Take 1/2 tablet by mouth every day at bedtime 2)  Clonazepam 0.5 Mg Tabs (Clonazepam) .[name].. 1 by mouth once daily prn  Patient Instructions: 1)  Four baby ASA once daily with b'fast. To ER if symptoms recur . Fasting labs in am: 2)  BMP ; 3)  Hepatic  Panel ; 4)  Lipid Panel ; 5)  TSH ; 6)  CBC w/ Diff .

## 2010-09-22 NOTE — Progress Notes (Signed)
Summary: Refill Request  Phone Note Refill Request Call back at [phone] Message from:  Pharmacy on August 18, 2010 8:30 AM  Refills Requested: Medication #1:  CLONAZEPAM 0.5 MG  TABS 1 each am & 1-2 at bedtime as needed   Dosage confirmed as above?Dosage Confirmed   Supply Requested: 1 month   [facility] [address]   Next Appointment Scheduled: none Initial call taken by: Harold Barban,  August 18, 2010 8:34 AM    Prescriptions: CLONAZEPAM 0.5 MG  TABS (CLONAZEPAM) 1 each am & 1-2 at bedtime as needed  #90 x 0   Entered by:   Shonna Chock CMA   Authorized by:   Marga Melnick MD   Signed by:   Shonna Chock CMA on 08/18/2010   Method used:   Printed then faxed to ...       [facility]  [address] [phone]* (retail)       [address]       [place], [place]  [address]       Ph: [phone] or [phone]       Fax: [phone]   RxID:   [id number]

## 2010-09-22 NOTE — Letter (Signed)
Summary: MMSE Form  MMSE Form   Imported By: Lanelle Bal 07/13/2010 12:30:32  _____________________________________________________________________  External Attachment:    Type:   Image     Comment:   External Document

## 2010-09-22 NOTE — Progress Notes (Signed)
Summary: UROLOGY REFERRAL   Phone Note Outgoing Call   Call placed by: Magdalen Spatz [facility],  June 29, 2010 11:49 AM Call placed to: Patient Summary of Call: IN REFERENCE TO UROLOGY REFERRAL.......Marland KitchenPATIENT WAS SCH'D FOR 07-12-2010 W/DR [facility] AT [facility].  HOWEVER, PATIENT TOLD ME TO CANCEL THE APPOINTMENT, HE IS COMPLETELY WORN OUT & IS NOT UP TO ANY MORE DR APPTS, DOES NOT WANT TO RSC'D.   Initial call taken by: Magdalen Spatz [facility],  June 29, 2010 11:49 AM  Follow-up for Phone Call        He needs to see Urologist as urologic symptoms are affecting his sleep. If those symptoms can be corrected ; sleep pattern will improve. Follow-up by: Marga Melnick MD,  June 29, 2010 2:22 PM  Additional Follow-up for Phone Call Additional follow up Details #1::        Left message on machine to call back to office. Lucious Groves CMA  June 29, 2010 2:32 PM   Patient notified and states that he will go the appt. Lucious Groves CMA  June 29, 2010 2:37 PM

## 2010-09-22 NOTE — Progress Notes (Signed)
Summary: Dizzy Spells  Phone Note Call from Patient Call back at Home Phone [phone]   Caller: Spouse  ~ Carney Bern Summary of Call: Patient's wife called and LM on triage VM stating that he is not feeling well. She said last week at the mall he had an episode where he almost fainted and had to sit and rest a while before they left and the wife drove them home. Will call her back for more information.   Patient's wife said that when they were at the mall he started to feel very dizzy. After resting a while he was ok to go back to the car and ride home. Patient is scared to drive anywhere now per the wife. Yesterday @ lunch he also started to feel dizzy but not as bad as when they were at the mall.   I told patient's wife I would let another physican review this since Alfonse Flavors was out of the office today and she said ok, but wanted to see [facility] tomorrow when he came back in the office.  Initial call taken by: Harold Barban,  April 18, 2010 9:18 AM  Follow-up for Phone Call        I worry about his heart -- or being anemic etc --however I do see that he just had an echo and carotid doppler for symptoms--- If worse than previous he should be seen today otherwise f/u [facility] Follow-up by: Loreen Freud DO,  April 18, 2010 10:17 AM  Additional Follow-up for Phone Call Additional follow up Details #1::        Patient's wife is aware of information.  Additional Follow-up by: Harold Barban,  April 18, 2010 10:31 AM

## 2010-09-22 NOTE — Assessment & Plan Note (Signed)
Summary: PROBLEM SLEEPING//PH   Vital Signs:  Patient profile:   77 year old male Weight:      197.8 pounds BMI:     26.92 Pulse rate:   72 / minute BP sitting:   136 / 78  (left arm) Cuff size:   regular  Vitals Entered By: Shonna Chock CMA (August 31, 2010 2:03 PM) CC: Sleep concerns , Insomnia   Primary Care Provider:  Dr. Alwyn Ren  CC:  Sleep concerns  and Insomnia.  History of Present Illness:    Dr Vonita Moss was seen 01/10; he was intolerant to Enablex ( constipation) & a topical agent ( fatigue). Dr Vonita Moss has recommended [facility], "but that's not for me". "I sleep only 5 hrs with Clonazepam 0.5 mg two at bedtime .  The patient reports early awakening  to go to the bathroom ( he is not sure what time) and daytime somnolence, but denies difficulty falling asleep. He & his wife sleep separately , but he is unaware of  nightmares, leg movements, snoring,  or apnea. He has nocturia 2-3X/ night .  Associated symptoms include depression. He denies day time naps , but "I sit in chair & cloase my eyes".  He denies  caffeine use.    Current Medications (verified): 1)  Doxazosin Mesylate 2 Mg Tabs (Doxazosin Mesylate) .... Take 1/2 Tablet By Mouth Every Day At Bedtime 2)  Clonazepam 0.5 Mg  Tabs (Clonazepam) .[name].. 1 Each Am & 1-2 At Bedtime As Needed 3)  Aspirin 81 Mg Tbec (Aspirin) .... Take One Tablet By Mouth Daily 4)  Ginkgo Biloba 120 Mg Caps (Ginkgo Biloba) .[name].. 1 Tab Every Other Day 5)  Ra Arthritis Pain Relief 650 Mg Cr-Tabs (Acetaminophen) .[name].. 1 Tab By Mouth At Bedtime 6)  Bupropion Hcl 75 Mg Tabs (Bupropion Hcl) .[name].. 1 Once Daily  Allergies: 1)  ! Fluoxetine Hcl (Fluoxetine Hcl) 2)  ! Enablex (Darifenacin Hydrobromide)  Physical Exam  General:  Appears younger than age,in no acute distress; affect flat  Nose:  External nasal examination shows no deformity or inflammation. Nasal mucosa are pink and moist without lesions or exudates. Mouth:  Oral mucosa and oropharynx  without lesions or exudates.   Oropharynx dry ; no compromise of oropharynx Heart:  normal rate, regular rhythm, no gallop, no rub, no JVD, and grade 1 /6 systolic murmur.   Psych:  depressed affect, flat affect, and subdued.     Impression & Recommendations:  Problem # 1:  INSOMNIA (ICD-780.52) Sleep Hygiene & risks of tranquilizers ( especially fal risk with neuro or MS injury) discussed Orders: Misc. Referral (Misc. Ref)  Problem # 2:  NOCTURIA (WGN-562.13)  I recommend he follow Dr Vonita Moss 's Rx([facility])  Complete Medication List: 1)  Doxazosin Mesylate 2 Mg Tabs (Doxazosin mesylate) .... Take 1/2 tablet by mouth every day at bedtime 2)  Clonazepam 0.5 Mg Tabs (Clonazepam) .[name].. 1 each am & 1-2 at bedtime as needed 3)  Aspirin 81 Mg Tbec (Aspirin) .... Take one tablet by mouth daily 4)  Ginkgo Biloba 120 Mg Caps (Ginkgo biloba) .[name].. 1 tab every other day 5)  Ra Arthritis Pain Relief 650 Mg Cr-tabs (Acetaminophen) .[name].. 1 tab by mouth at bedtime 6)  Bupropion Hcl 75 Mg Tabs (Bupropion hcl) .[name].. 1 once daily  Patient Instructions: 1)  Follow up as recommended by Dr Vonita Moss. An appt will be made with Dr Marylou Flesher.DO NOT CLOSE EYES DURING THE DAY.   Orders Added: 1)  Est. Patient  Level III [29562] 2)  Misc. Referral [Misc. Ref]

## 2010-09-22 NOTE — Progress Notes (Signed)
Summary: pt still is not sleeping well   Phone Note Call from Patient Call back at Home Phone [phone]   Caller: Spouse Reason for Call: Talk to Nurse, Talk to Doctor Summary of Call: pt still is not sleeping well he is only getting 4hrs and he is not feeling well so they want to know if he can take 1 and a half of the trazodone 50mg  hs instead of 1pill hs Initial call taken by: Omer Jack,  May 19, 2010 12:34 PM     Appended Document: pt still is not sleeping well Ok to increase to 75 mg trazodone.   Appended Document: pt still is not sleeping well    Clinical Lists Changes  Medications: Changed medication from TRAZODONE HCL 50 MG TABS (TRAZODONE HCL) take one at bedtime as needed for sleep to TRAZODONE HCL 50 MG TABS (TRAZODONE HCL) take one and one-half at bedtime as needed for sleep Observations: Added new observation of MEDRECON: current updated (05/19/2010 17:40)       Current Medications (verified): 1)  Doxazosin Mesylate 2 Mg Tabs (Doxazosin Mesylate) .... Take 1/2 Tablet By Mouth Every Day At Bedtime 2)  Clonazepam 0.5 Mg  Tabs (Clonazepam) .[name].. 1 By Mouth Once Daily Prn 3)  Aspirin 81 Mg Tbec (Aspirin) .... Take One Tablet By Mouth Daily 4)  Ginkgo Biloba 120 Mg Caps (Ginkgo Biloba) .[name].. 1 Tab Every Other Day 5)  Ra Arthritis Pain Relief 650 Mg Cr-Tabs (Acetaminophen) .[name].. 1 Tab By Mouth At Bedtime 6)  Trazodone Hcl 50 Mg Tabs (Trazodone Hcl) .... Take One and One-Half At Bedtime As Needed For Sleep  Allergies: No Known Drug Allergies wife aware OK to increase Trazdone to 75mg  as needed hs sleep

## 2010-09-22 NOTE — Consult Note (Signed)
Summary: [facility]  [facility]   Imported By: Lanelle Bal 08/17/2010 09:27:28  _____________________________________________________________________  External Attachment:    Type:   Image     Comment:   External Document

## 2010-09-28 NOTE — Progress Notes (Signed)
Summary: refill  Phone Note Refill Request Message from:  Fax from Pharmacy on September 22, 2010 4:19 PM  Refills Requested: Medication #1:  CLONAZEPAM 0.5 MG  TABS 1 each am & 1-2 at bedtime as [facility] fax [phone] - phone [phone]  Initial call taken by: Okey Regal Spring,  September 22, 2010 4:20 PM    Prescriptions: CLONAZEPAM 0.5 MG  TABS (CLONAZEPAM) 1 each am & 1-2 at bedtime as needed  #90 x 0   Entered by:   Shonna Chock CMA   Authorized by:   Marga Melnick MD   Signed by:   Shonna Chock CMA on 09/22/2010   Method used:   Printed then faxed to ...       [facility] [address] [phone][facility] (retail)       [address]       [place], [place]  [address]       Ph: [phone] or [phone]       Fax: [phone]   RxID:   [id number]

## 2010-10-04 ENCOUNTER — Other Ambulatory Visit: Payer: Self-pay | Admitting: Dermatology

## 2010-10-12 ENCOUNTER — Encounter: Payer: Self-pay | Admitting: Internal Medicine

## 2010-10-12 ENCOUNTER — Ambulatory Visit (INDEPENDENT_AMBULATORY_CARE_PROVIDER_SITE_OTHER): Payer: 59 | Admitting: Internal Medicine

## 2010-10-12 DIAGNOSIS — K5900 Constipation, unspecified: Secondary | ICD-10-CM

## 2010-10-18 NOTE — Assessment & Plan Note (Signed)
Summary: constipated/cbs   Vital Signs:  Patient profile:   77 year old male Height:      72 inches Weight:      196 pounds O2 Sat:      89 % on Room air Temp:     98.5 degrees F oral Pulse rate:   99 / minute Resp:     16 per minute BP sitting:   150 / 72  (left arm)  Vitals Entered By: Jeremy Johann CMA (October 12, 2010 12:39 PM)  O2 Flow:  Room air CC: consitpation Comments last BM this AM very little induce by laxative   Primary Care Provider:  Dr. Alwyn Ren  CC:  consitpation.  History of Present Illness:    [name] has had constipation for > 6 weeks , ? related to Enablex, last dose in early 07/2010.  He was seen for this in ER 12/17; stool softener & Miralax Rxed. The latter was last used > 4 weeks ago. He has diarrhea with Mag Citrate. He is not on narcotics. TSH was 2.42 in 06/2010.  Current Medications (verified): 1)  Doxazosin Mesylate 2 Mg Tabs (Doxazosin Mesylate) .... Take 1/2 Tablet By Mouth Every Day At Bedtime 2)  Clonazepam 0.5 Mg  Tabs (Clonazepam) .[name].. 1 Each Am & 1-2 At Bedtime As Needed 3)  Aspirin 81 Mg Tbec (Aspirin) .... Take One Tablet By Mouth Daily 4)  Ginkgo Biloba 120 Mg Caps (Ginkgo Biloba) .[name].. 1 Tab Every Other Day 5)  Ra Arthritis Pain Relief 650 Mg Cr-Tabs (Acetaminophen) .[name].. 1 Tab By Mouth At Bedtime 6)  Bupropion Hcl 75 Mg Tabs (Bupropion Hcl) .[name].. 1 Once Daily  Allergies (verified): 1)  ! Fluoxetine Hcl (Fluoxetine Hcl) 2)  ! Enablex (Darifenacin Hydrobromide)  Physical Exam  General:  in no acute distress; alert,appropriate and cooperative throughout examination Neck:  No deformities, masses, or tenderness noted. Heart:  normal rate, regular rhythm, no gallop, no rub, no JVD, and grade 1 /6 systolic murmur.   Abdomen:  Bowel sounds positive,abdomen soft and non-tender without masses, organomegaly or hernias noted. Neurologic:  DTRs symmetrical and normal in UE; LE decreased due to TKR   Impression & Recommendations:  Problem  # 1:  CONSTIPATION (ICD-564.00)  Complete Medication List: 1)  Doxazosin Mesylate 2 Mg Tabs (Doxazosin mesylate) .... Take 1/2 tablet by mouth every day at bedtime 2)  Clonazepam 0.5 Mg Tabs (Clonazepam) .[name].. 1 each am & 1-2 at bedtime as needed 3)  Aspirin 81 Mg Tbec (Aspirin) .... Take one tablet by mouth daily 4)  Ginkgo Biloba 120 Mg Caps (Ginkgo biloba) .[name].. 1 tab every other day 5)  Ra Arthritis Pain Relief 650 Mg Cr-tabs (Acetaminophen) .[name].. 1 tab by mouth at bedtime 6)  Bupropion Hcl 75 Mg Tabs (Bupropion hcl) .[name].. 1 once daily  Patient Instructions: 1)  Drink to thirst up to 40 oz / day. Take Align once daily X 1 month as trial. Take Miralax every 3rd day as needed .   Orders Added: 1)  Est. Patient Level III [57846]

## 2010-10-19 ENCOUNTER — Telehealth: Payer: Self-pay | Admitting: Internal Medicine

## 2010-10-24 ENCOUNTER — Telehealth (INDEPENDENT_AMBULATORY_CARE_PROVIDER_SITE_OTHER): Payer: Self-pay | Admitting: *Deleted

## 2010-10-27 NOTE — Progress Notes (Signed)
Summary: Refill Request   Phone Note Refill Request Message from:  Fax from Pharmacy on October 19, 2010 1:18 PM  Refills Requested: Medication #1:  CLONAZEPAM 0.5 MG  TABS 1 each am & 1-2 at bedtime as [facility] [phone]  Initial call taken by: Okey Regal Spring,  October 19, 2010 1:18 PM  Follow-up for Phone Call        09/22/2010 number 90 sent in, Dr.Kmarion Rawl please advise  Follow-up by: Shonna Chock CMA,  October 19, 2010 3:06 PM  Additional Follow-up for Phone Call Additional follow up Details #1::        #90 OK; he is incredibly anxious  Additional Follow-up by: Marga Melnick MD,  October 20, 2010 5:48 AM    Prescriptions: CLONAZEPAM 0.5 MG  TABS (CLONAZEPAM) 1 each am & 1-2 at bedtime as needed  #90 x 0   Entered by:   Shonna Chock CMA   Authorized by:   Marga Melnick MD   Signed by:   Shonna Chock CMA on 10/20/2010   Method used:   Printed then faxed to ...       [facility] [address] [phone]* (retail)       [address]       [place], [place]  [address]       Ph: [phone] or [phone]       Fax: [phone]   RxID:   [id number]

## 2010-11-01 NOTE — Progress Notes (Signed)
Summary: refill  Phone Note Refill Request Message from:  Fax from Pharmacy on October 24, 2010 8:23 AM  Refills Requested: Medication #1:  CLONAZEPAM 0.5 MG  TABS 1 each am & 1-2 at bedtime as needed [facility] [phone]  Initial call taken by: Okey Regal Spring,  October 24, 2010 8:23 AM  Follow-up for Phone Call        I called the pharmacy and asked if rx was received on the 1st and they indicated yes and this request was sent in error Follow-up by: Shonna Chock CMA,  October 24, 2010 2:43 PM

## 2010-11-18 ENCOUNTER — Other Ambulatory Visit: Payer: Self-pay | Admitting: Internal Medicine

## 2010-11-18 MED ORDER — CLONAZEPAM 0.5 MG PO TABS: 0.5000 mg | ORAL_TABLET | Freq: Two times a day (BID) | ORAL | Status: DC

## 2010-11-29 ENCOUNTER — Other Ambulatory Visit: Payer: Self-pay | Admitting: *Deleted

## 2010-11-29 MED ORDER — CLONAZEPAM 0.5 MG PO TABS: ORAL_TABLET | ORAL | Status: DC

## 2011-01-06 NOTE — H&P (Signed)
[name], NACK NO.:  [id number]   MEDICAL RECORD NO.:  [id number]          PATIENT TYPE:  EMS   LOCATION:  ED                           FACILITY:  [facility]   PHYSICIAN:  Christell Faith, MD   DATE OF BIRTH:  16-Feb-1925   DATE OF ADMISSION:  10/22/2006  DATE OF DISCHARGE:                              HISTORY & PHYSICAL   ATTENDING PHYSICIAN:  Titus Dubin. Alwyn Ren, MD,FACP,FCCP.   CHIEF COMPLAINT:  My right side hurts.   HISTORY OF PRESENT ILLNESS:  This is an 77 year old white male with  intermittent pain in the right upper quadrant of the abdomen and over  the right lateral thorax since Saturday morning (it is currently Monday  evening).  The pain is worse with movement and with coughing.  He has  also had emesis x1 Saturday night and has experienced anorexia since  then.  He had a fever last night to 102 at home and over 103 degrees  here in the emergency room tonight.  He has had no viral syndrome, no  cough, no sore throat, no runny nose, no cold or flu-like symptoms.  He  has not had a bowel movement today and feels somewhat distended in his  abdomen.   REVIEW OF SYSTEMS:  Negative for vision change, headache, stroke, or TIA  symptoms.  Negative for cough, dysuria, rash, diarrhea, chest pain,  shortness of breath, cough.  Positive for nausea and vomiting on  Saturday, abdominal distention, anorexia since Saturday.  The balance of  10 systems is reviewed and is negative.   PAST MEDICAL HISTORY:  1. Anemia, not otherwise specified.  According to the patient, he has      had a negative upper and lower endoscopy within the past year.  2. History of total right knee replacement.  3. Benign prostatic hypertrophy.  4. Hypertension.  5. Significant osteoarthritis in the left knee.   FAMILY HISTORY:  He has a daughter alive and healthy in [place].   SOCIAL HISTORY:  He quit tobacco 25 years ago.  Rarely uses alcohol.  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   DRUG ALLERGIES:  No known drug allergies.   MEDICATIONS:  1. AcipHex 20 mg p.o. daily.  2. Aspirin 81 mg p.o. daily.  3. Iron pills, unknown dose.  4. Cardura, unknown dose.   PHYSICAL EXAMINATION:  VITAL SIGNS:  T max in the emergency room, 103.2,  T current 99, pulse 87-112, respiratory rate 22, blood pressure 143/76.  Oxygen saturation recorded as low as 83% on room air.  Currently, 92-96%  on 4 liters of oxygen nasal cannula.  GENERAL:  This is an older white male in no distress.  He is not in any  respiratory distress.  He appears fairly comfortable.  HEENT:  Mucous membranes are dry.  No oral lesions.  Pupils are equal  and reactive.  Sclerae are clear.  NECK:  Supple.  No elevation of JVD.  No carotid bruits.  CARDIAC:  Tachycardic rate.  Regular rhythm.  A 2/6 systolic ejection  murmur.  No S3.  No S4.  No rub.  LUNGS:  Decreased breath sounds in the bases bilaterally with coarse  rales in the lower one-fourth of the lung fields bilaterally.  There is  no tactile or vocal fremitus.  ABDOMEN:  Distended.  There are no bowel sounds.  It is tender to  palpation in the right upper quadrant.  There is no guarding.  No  rebound.  GENITOURINARY:  Deferred.  EXTREMITIES:  No edema.  There is some scattered bruising over the lower  extremities.  A small red patch on the dorsum of the left foot.  SKIN:  Several seborrheic keratoses.  NEUROLOGIC:  There appears to be mild cognitive impairment and possible  mild short term memory loss.  He is awake, alert and oriented x3;  however, with no focal deficits.   LABS:  White blood cells 14.2 with 12.6% neutrophils, hemoglobin 14.7  gm, platelets 158.  Sodium 138, potassium 4.4, BUN 17, creatinine 1.2.  Albumin 3.7, AST 21, lipase 18.  CK-MB 1.6, troponin less than 0.05.  Urinalysis positive for nitrite, positive for leukocyte esterase, small  amount.  Nasal swab negative for influenza A and B.  D-dimer  positive.   CHEST X-RAY:  No infiltrate.  Small right-sided pleural effusion.   CT angiogram of the lungs negative for PE.  Positive for atelectasis in  the bibasilar lung fields.   Electrocardiogram reveals a normal sinus rhythm at a rate of 95.  Borderline left axis deviation and somewhat poor R wave progression  across the precordium, nonspecific.   IMPRESSION/PLAN:  Fever and leukocytosis of unclear source in an 77 year old man.  Exam  points towards abdominal source of infection.   1. Add on total bilirubin and alkaline phosphatase and obtain a STAT      right upper quadrant ultrasound to rule out cholecystitis.  2. Initiate Unasyn empirically 3 gm IV q.6h. to cover abdominal      sources.  3. Obtain blood cultures x2 and urine culture.  4. Surgery consult if the ultrasound is positive and consider CT scan      of the abdomen and pelvis if ultrasound is negative.  5. Supportive care with IV fluids, Phenergan, morphine, and scheduled      Tylenol.  Keep the patient n.p.o. except for sips of ice water      until this is further sorted out.  6. Check a chest x-ray again tomorrow morning to see if an infiltrate      has fluffed out, especially considering his      decreased breath sounds and hypoxia.  7. Continue proton pump inhibitor. DVT prophylaxis in the form of      heparin 5000 units subcu t.i.d.  8. Serial abdominal exams.      Christell Faith, MD  Electronically Signed     NDL/MEDQ  D:  10/22/2006  T:  10/23/2006  Job:  [id number]

## 2011-01-06 NOTE — Discharge Summary (Signed)
[name], [name]                ACCOUNT NO.:  [id number]   MEDICAL RECORD NO.:  [id number]          PATIENT TYPE:  INP   LOCATION:  1429                         FACILITY:  [facility]   PHYSICIAN:  Rosalyn Gess. Norins, MD  DATE OF BIRTH:  07-Feb-1925   DATE OF ADMISSION:  10/22/2006  DATE OF DISCHARGE:  10/30/2006                               DISCHARGE SUMMARY   The patient was discharged by surgery on October 30, 2006.   ADMITTING DIAGNOSIS:  Fever with leukocytosis.   CONSULTANTS:  Sandria Bales. Ezzard Standing, M.D.   DISCHARGE DIAGNOSES:  1. Acute cholecystitis with rupture.  2. Diarrhea.  3. Benign prostatic hypertrophy that was stable.  4. History of hypoxemia.  5. Deconditioning.   PROCEDURES:  1. Cholecystectomy.  2. Rib films, October 22, 2006, which showed no acute findings.  3. Chest x-ray, October 22, 2006, which showed mild elevation right in      the diaphragm and question of small right pleural effusion  4. CT angiography the chest, October 22, 2006, which showed no evidence      of pulmonary embolus, confirmed a small right pleural effusion,      revealed perihepatic ascites, cardiomegaly.  5. Abdominal ultrasound, October 22, 2006, which showed cholelithiasis      with gallbladder wall thickening, suspicious for acute      cholecystitis with no biliary dilatation.  6. Chest x-ray, October 23, 2006, with cardiomegaly, increased right      lower lobe segmental atelectasis.  7. October 23, 2006, negative operative cholangiogram.  8. Chest x-ray, October 23, 2006, which showed endotracheal tube      placement.   HISTORY OF PRESENT ILLNESS:  The patient was seen in the emergency  department and admitted by the Christell Faith, M.D., on call  cardiology fellow.  This is an 77 year old gentleman who presented with  intermittent pain in the right upper quadrant of the abdomen, which  radiated to the right lateral thorax since the day prior to admission.  He reported the pain was worse with movement and  with coughing.  He  reported he had emesis x1, and he had been anorexic since that time.  He  had fever at home to 102, and was noted to have fever of 103 in the  emergency department.  Because of these symptoms he was admitted to  hospital with abdominal tenderness and distension.  Please see the  dictated H&P for past medical history, family history, social history,  admission exam.  This exam was noted for an absence of bowel sounds.  Tender to palpation in the right upper quadrant but no guarding or  rebound.   HOSPITAL COURSE:  1. GI.  The patient was admitted and was diagnosed as having acute      cholecystitis.  The patient was then taken to the operating room in      the afternoon of the 4th for acute surgery.  The patient did have a      ruptured gallbladder, and he had a laparoscopic cholecystectomy.      The patient was slow to  extubate.  He was extubated on the 5th      without complication.  His postoperative course was relatively      uncomplicated, except for some mild urinary retention and a mild      ileus.  The patient was transferred to a regular floor on the 7th.      The patient had a relatively slow recovery in regards to his ileus      but he was able to eventually be advanced to a regular diet.  He      was ambulated.  It was felt by the by the 11th, he was taking a      diet, was stable, and able to be discharged home.  2. Post-op diarrhea.  The patient did have diarrhea following surgery.      C diff was checked x2 and was negative.  It was thought the      diarrhea was from a resolving ileus.  3. BPH.  The patient had some mild urinary retention postoperatively      but this stabilized.  The Foley was removed. He was able to void      without difficulty.  4. Pulmonary.  The patient with some atelectasis and small effusion.      He was able to do well with this.  His room air oxygen saturation      was satisfactory.  And it was thought he was stable to be       discharged home.   Final examination, October 30, 2006, temperature was 98, blood pressure  134/63, heart rate 73, respirations 18, O2 sat was 95% on 2 meters.  Blood was checked prior to discharge.  The patient had a resting sat of  94% on 2 liters.  On room air during ambulation, his  O2 sat was 91-93%  per physical therapy notes.  It was felt the patient would be stable at  home with ongoing home health care with PT and OT.  The patient was sent  home on oral antibiotics for 10 additional days, not specified by the  surgeon or written in the discharge instruction sheet.  The patient also  was noted to have a leak from his JP drain site.  It was felt by the  attending surgeon that he could go home with this and be seen in  followup.  The patient was to contact Dr. Ezzard Standing for a followup  appointment.   DISCHARGE MEDICATIONS:  The patient is continue with:  1. AcipHex 20 mg daily.  2. Aspirin 81 mg daily.  3. Iron.  4. Cardura at home dose.  5. The patient was to be the antibiotics as instructed by Dr. Ezzard Standing.   DISPOSITION:  The patient was discharged home.  [facility] by  [facility].   FOLLOWUP:  1. Dr. Ezzard Standing as instructed.  2. He is to call Dr. Alwyn Ren for followup appointment as well..   The patient's condition time of discharge was medically stable and  cleared for discharge home by Dr. Ezzard Standing for general surgery.      Rosalyn Gess Norins, MD  Electronically Signed     MEN/MEDQ  D:  12/01/2006  T:  12/01/2006  Job:  [id number]   cc:   Sandria Bales. Ezzard Standing, M.D.  [address], [address]  [place]  [place] [address]   Titus Dubin. Alwyn Ren, MD,FACP,FCCP  [phone] [place]  [place] [address]

## 2011-01-06 NOTE — Assessment & Plan Note (Signed)
[facility]                        [place] OFFICE NOTE   NAME:Heslin, ROBB SIBAL                       MRN:          [id number]  DATE:11/20/2006                            DOB:          [birth year]    Mr. Pitney was seen November 20, 2006 with symptoms suggestive of a benign  positional vertigo in the setting of post lap cholecystectomy 10/24/2006  by Dr Ovidio Kin with profound  fatigue post op.  Those hospital records were reviewed.   He has been home 3 weeks and has had ongoing fatigue.   Additionally, he has had 3 episodes of dizziness.  The first occurred  March 31, early in the morning while in bed.  The second two episodes  also occurred with position change in bed.  They have not been related  to standing up or ambulation.   Following hospitalization, he was seen by physical therapy at home and  it was determined that his balance was good. It was recommended that he  not use the walker, and he was given the option of using the cane or  not.  He is continued to be followed by a home health nurse once a week.   Prior to the dizziness symptoms, he did have some soreness in the left  ribs, for which he took Arthritis Strength Tylenol with relief.   He had no other triggers prior to the episode.  He specifically had no  change in his heart rhythm or rate, and as stated, it was not related to  position change, other than that in bed.   Presently he is on:  1. AcipHex 20 mg each morning.  2. He is also on Cardura 2 mg 1/2 daily.  3. Clonazepam as needed.  4. Ginkgo biloba extract.   His weight is down 5 pounds to 193.  Pulse is 100.  Respiratory rate 18.  Blood pressure 110/60.  Pupils were equal, round and reactive to light.  There was no nystagmus  with lateral gaze.  He has full extraocular motion.  Air conduction is  greater than bone conduction.  There is no lateralization.  He does have  some decreased auditory acuity. There is some  cerumen in the auditory  canals.  The most striking change is profound dryness of the tongue.  CHEST:  Clear.  He has no splinting, rubs or rales.  There is no pain  with compression of the left thorax.  He has a gallop type rhythm with a flow murmur.  He has no tenderness in the left upper quadrant.  Bowel sounds are heard  in his left chest when he is supine, suggesting hiatal hernia.  The operative sites are well healed.  Strength is good.  His gait is somewhat broad based but steady.  Deep  tendon reflexes are normal.  No carotid bruits were auscultated.  He is oriented x3.   His wife questions depression.  Clinically this  is notclinically  apparent, and his affect may reflect the serious illness from which he  is recovering.  It is recommended that he elevate the head  of the bed or  sleep with a wedge to try to minimize benign positional vertigo.  He  will be given meclizine to be taken as needed if this persists.  Because  of the relatively low blood pressure, the Cardura will be held.   It will be recommended that he not eat for 2 to 3 hours prior to going  to bed.  He should also avoid triggers of reflux, which would include  the aspirin family, alcohol, peppermint, tobacco, and caffeine, coffee,  tea, cola or chocolate as the chest pain may be related to a hiatal  hernia.   BUN and creatine will be performed to rule out dehydration.  His fatigue  will be evaluated with TSH and CBC and differential.  During  hospitalization, his hematocrit was 35.2 at discharge.   EKG did not reveal any significant dysrhythmias or ischemic change.   He will be reassessed in 1 week.     Titus Dubin. Alwyn Ren, MD,FACP,FCCP  Electronically Signed    WFH/MedQ  DD: 11/20/2006  DT: 11/20/2006  Job #: [id number]

## 2011-01-06 NOTE — Op Note (Signed)
[name], [name]                ACCOUNT NO.:  [id number]   MEDICAL RECORD NO.:  [id number]          PATIENT TYPE:  INP   LOCATION:  1512                         FACILITY:  [facility]   PHYSICIAN:  Sandria Bales. Ezzard Standing, M.D.  DATE OF BIRTH:  1924-11-06   DATE OF PROCEDURE:  10/23/2006  DATE OF DISCHARGE:                               OPERATIVE REPORT   PREOPERATIVE DIAGNOSIS:  Cholecystitis with cholelithiasis.   POSTOPERATIVE DIAGNOSIS:  Acute cholecystitis with cholelithiasis,  rupture of gallbladder with approximately 300-500 cc of bile-stained  fluid over the right lobe of his liver.   PROCEDURE:  Laparoscopic cholecystectomy with intraoperative  cholangiogram, irrigation with 4 liters of normal saline.  (30 extra  minutes of surgery time due to rupture/difficulty of case)   SURGEON:  Sandria Bales. Ezzard Standing, M.D.   FIRST ASSISTANT:  Adolph Pollack, M.D.   ANESTHESIA:  General endotracheal.   ESTIMATED BLOOD LOSS:  75 cc.   DRAINS:  A 19 [place] Blake drain.   [name] is a 77 year old white male who presented acutely to [facility] on October 22, 2006 with right upper quadrant abdominal pain.  He is noted to have a leukocytosis.  A CT scan of his chest ruled out  pulmonary embolus and an ultrasound showed gallstones with a  thickened  gallbladder wall.  He had signs and symptoms of acute cholecystitis.   I discussed with the patient and his wife about proceeding with  cholecystectomy at this time.  The potential complications included but  not limited to bleeding, infection, bile duct injury, open surgery.   OPERATIVE NOTE:  Patient placed in a supine position, given a general  endotracheal anesthetic.  I prepped his abdomen with Betadine solution  and sterilely draped him.  He had an OG tube passed at the initiation of  the procedure.  He is already on Unasyn as an antibiotic.  I went  through an infraumbilical incision and got into his abdominal cavity,  placed a 0  degree laparoscope and did a laparoscopic exploration.   Three different trocars were placed, a 10 mm subxiphoid, a 5 mm right  mid subcostal, and a 5 mm lateral subcostal trocar.  Upon entering the  abdominal cavity, the patient had a lot of bile-stained fluid in his  right upper quadrant, which I had to peel off some of his omentum, and  what happened, his gallbladder ruptured and loculated about 300-400 cc  of bile-stained fluid in his right upper quadrant and over his liver.  I  was able to tease down the omentum, which had walled some of  this off.  I found a hole in the dome of his gallbladder.  I grabbed the  gallbladder and started rotating it cephalad.  I then took my dissection  down.  He was noted to really have friable tissue, consistent with  gentleman, either very old or on steroids.   I got down to where I identified a couple of branches of the cystic  artery in the triangle of Calot, identified the cystic duct, which  actually in that  area of his gallbladder looked a lot better.   I then shot an intraoperative cholangiogram.  The intraoperative  cholangiogram was shot using a cut-off taut catheter inserted through a  14 gauge Jelco.  The taut catheter was inserted through a 14 gauge  Jelco, and the taut catheter was then inserted in the side of the cut  cystic duct and secured with an Endoclip.  The intraoperative  cholangiogram, using 8 cc of half-strength of Hypaque solution, showed  free flow down the cystic duct, into the common bile duct, into the  duodenum.  It was actually fairly tiny, the common bile duct, with a  fairly long cystic duct.   I then placed three clips in the long cystic duct, removed the taut  catheter.  I sharply and bluntly dissected the gallbladder from the  gallbladder bed.  He had a component of chronic cholecystectomy where  the wall of the gallbladder was really stuck against the liver, so I  probably left a little bit of gallbladder wall  behind.  I did place the  gallbladder in an EndoCatch bag and delivered it through the umbilicus.   I then revisualized the gallbladder bed.  I used Bovie electrocautery to  control bleeding, but there was no bile leak, no bleeding from the  gallbladder bed.  I revisualized the triangle of Calot, and there was no  bleeding or bile leak.  Because of the rupture and the difficulty of the  case, I spent at least 30 extra minutes with dissection/surgery.   I then irrigated the abdomen out with a total of almost 4 liters of  saline.  I brought a Blake drain through his lateral port.  It is a 48  J[place] Blake drain that we will leave in at least overnight.  Then, I  removed the trocars under direct visualization.  There was no bleeding  at any trocar site.  The umbilical port was closed with a 0 Vicryl  suture.  The skin at each port was closed with a 5-0 Vicryl suture,  painted with Tincture of Benzoin and Steri-Strip.  The patient tolerated  the procedure well, was transported to the recovery room.   He remains intubated at the time of this dictation.  We will go to the  ICU overnight.  Again, he is a very sick older man, and this is really a  big hit to him physically.  I spoke to Dr. Darryll Capers, who is on call  for [place] primary care at the end of the operation to inform him about  what we found in his postop course.      Sandria Bales. Ezzard Standing, M.D.  Electronically Signed     DHN/MEDQ  D:  10/23/2006  T:  10/23/2006  Job:  [id number]   cc:   Titus Dubin. Alwyn Ren, MD,FACP,FCCP  [phone]  [place] [address]   Rosalyn Gess. Norins, MD  [address]  [place]  [place] [address]   Vania Rea. Jarold Motto, MD, FACG, FACP, FAGA  [address]  [place]  [place] [address]

## 2011-01-06 NOTE — Consult Note (Signed)
[name], [name]                ACCOUNT NO.:  [id number]   MEDICAL RECORD NO.:  [id number]          PATIENT TYPE:  INP   LOCATION:  1512                         FACILITY:  [facility]   PHYSICIAN:  Sandria Bales. Ezzard Standing, M.D.  DATE OF BIRTH:  March 13, 1925   DATE OF CONSULTATION:  10/23/2006  DATE OF DISCHARGE:                                 CONSULTATION   HISTORY OF PRESENT ILLNESS:  This is an 77 year old white male who is a  patient of Dr. Titus Dubin. Hopper's, who was doing fairly well from a GI  standpoint until late Saturday night or early Sunday morning (October 21, 2006).  He was woken up at about 4 a.m. on Sunday morning with a  stomachache.  He said he had several regular bowel movements and noticed  increasing flatus.  By Sunday afternoon, on October 21, 2006, he had  vomited and his pain seemed to worsen and localize along his right chest  and right upper quadrant.   He came to the [facility] on Monday afternoon,  October 22, 2006.  He was seen and admitted by Cruz Condon, a resident  working for [employer].  Initially there was concern about him  possibly having a pulmonary embolism, because he was complaining of  right upper quadrant and right chest pain.  He had a CT scan by Dr.  Anselmo Pickler which showed some cardiomegaly, a questionable small right  pleural effusion and bibasilar atelectasis, right being worse than left,  but no evidence of a pulmonary embolism.  He did have some peri-hepatic  ascites.  His CT scan stopped immediately above his gall bladder in the  abdomen.   He has had no prior abdominal surgery.  He had a prior upper endoscopy  and colonoscopy.  No history of peptic ulcer disease, liver disease,  colon disease and he has had no prior abdominal surgery.   ALLERGIES:  No known drug allergies.   CURRENT MEDICATIONS:  1. Aciphex 20 mg daily.  2. Aspirin daily.  3. Iron pills.  4. Cardura, unknown dose.   REVIEW OF SYSTEMS:  No  history of seizure or loss of consciousness.  PULMONARY:  He seems somewhat short of breath, though he says he is not  on any oxygen.  CARDIAC:  He has never had any chest pain or angina or cardiac  catheterization.  GASTROINTESTINAL:  See the history of present illness. She had a  colonscopy by Dr. Nyra Capes in the summer of 2007.  UROLOGIC:  He has a history of benign prostatic hypertrophy.  He has a  history of hypertension.  MUSCULOSKELETAL:  He had a total right knee and currently thinks he may  need a total left knee or have severe arthritis of the left knee.   PHYSICAL EXAMINATION:  GENERAL:  He was in the room by himself when I  examined him.  He is a well-nourished, older white male, who looks  fairly old.  He does have nasal oxygen.  He is sitting.  VITAL SIGNS:  Temperature 98.1 degrees, pulse 79, respirations 20,  blood  pressure 150/73.  NECK:  Supple, I could feel no masses or thyromegaly.  LUNGS:  Clear to auscultation with symmetric breath sounds.  HEART:  A regular rate and rhythm without murmur or rub.  ABDOMEN:  He points to his right subcostal and right flank area where he  is uncomfortable.  He has a pretty good-sized abdomen, which is  moderately distended.  I do hear some bowel sounds.  RECTAL:  I did not do a rectal exam on him.  EXTREMITIES:  He complains of his left knee arthritic trouble.  He has  trouble with that knee and again he had right knee replaced and he uses  a cane for walking.   LABORATORY DATA:  That I have show a white blood cell count of 16,800,  hemoglobin 14, hematocrit 41, 89% neutrophils.  Sodium 140, potassium  3.9, chloride 107, glucose 144, BUN 14, creatinine 1.1.  Total bilirubin  2.3, alkaline phosphatase 56, albumin 3, calcium 8.5.  Serum iron is 10  which is low.  He does have a normal serum amylase, lipase of 18.  This  was on October 22, 2006.   Again, he underwent a CT scan which showed no pulmonary embolism, small  right  pleural effusion, bibasilar atelectasis and some peri-hepatic  ascites.   He underwent an ultrasound read by Dr. Arbie Cookey.  I reviewed this with  Rosealee Albee.  This does show cholelithiasis with questionable mild  gallbladder wall thickening.  No biliary dilatation.  He does have a  complex right upper pole cyst.   DIAGNOSIS:  1. Cholelithiasis/Probable cholecystitis:  He has an elevated white      blood cell count.  His pain is located in the right upper quadrant.      He had an abnormal ultrasound which shows gall stones and probable      thickened wall.  I do not think I will do any more diagnostic tests      will add to understanding his diagnosis.   I talked with the patient and with his wife about proceeding with  gallbladder surgery. I discussed with them the indications and potential  risks of surgery.  These potential risks include but are not limited to  bleeding, infection, bowel injury, duct injury and the possibility of  open surgery.  I think he already has infection.  I would guess his  chance of having to do his surgery open probably runs 20%-30%.  I think  it is much increased, because of both his abdominal size and the  possible severity of the gallbladder disease.  If they are willing, I  will go ahead today.  I have spoken with Dr. Rosalyn Gess. Norins on the phone about the  patient's medical condition and though old, he thought that we could  proceed with surgery.  1. Hypertension.  2. Benign prostatic hypertrophy.  3. History of right knee replacement.  4. Osteoarthritis of his left knee.  5. Right upper pole cyst.  I discussed this with patient and his wife.  6. Cardiomegaly by CXR.  7. Small right pleural effusion with bilateral atelectasis.  On CT.  8. Leukocytosis, presumably secondary to gallbladder disease.  9. Mildly depressed albumin.  10.Severe iron deficiency with serum iron of 10 mcg per dL.  04.[id number] by Dr. Nyra Capes.     Sandria Bales. Ezzard Standing, M.D.  Electronically Signed     DHN/MEDQ  D:  10/23/2006  T:  10/23/2006  Job:  [id number]   cc:   Rosalyn Gess. Norins, MD  [address]  [place]  [place] [address]   Titus Dubin. Alwyn Ren, MD,FACP,FCCP  [phone] [place]  [place] [address]   Vania Rea. Jarold Motto, MD, FACG, FACP, FAGA  [address]  [place]  [place] [address]

## 2011-01-12 ENCOUNTER — Other Ambulatory Visit: Payer: Self-pay

## 2011-01-12 MED ORDER — BUPROPION HCL 75 MG PO TABS: 75.0000 mg | ORAL_TABLET | Freq: Every day | ORAL | Status: DC

## 2011-01-20 ENCOUNTER — Encounter: Payer: Self-pay | Admitting: Internal Medicine

## 2011-01-20 ENCOUNTER — Ambulatory Visit (INDEPENDENT_AMBULATORY_CARE_PROVIDER_SITE_OTHER): Payer: 59 | Admitting: Internal Medicine

## 2011-01-20 DIAGNOSIS — F329 Major depressive disorder, single episode, unspecified: Secondary | ICD-10-CM

## 2011-01-20 DIAGNOSIS — R5381 Other malaise: Secondary | ICD-10-CM

## 2011-01-20 DIAGNOSIS — E785 Hyperlipidemia, unspecified: Secondary | ICD-10-CM

## 2011-01-20 DIAGNOSIS — R7309 Other abnormal glucose: Secondary | ICD-10-CM

## 2011-01-20 DIAGNOSIS — R269 Unspecified abnormalities of gait and mobility: Secondary | ICD-10-CM

## 2011-01-20 DIAGNOSIS — G4700 Insomnia, unspecified: Secondary | ICD-10-CM

## 2011-01-20 DIAGNOSIS — R2681 Unsteadiness on feet: Secondary | ICD-10-CM

## 2011-01-20 DIAGNOSIS — D649 Anemia, unspecified: Secondary | ICD-10-CM

## 2011-01-20 LAB — CBC WITH DIFFERENTIAL/PLATELET
Basophils Absolute: 0 10*3/uL (ref 0.0–0.1)
Eosinophils Absolute: 0.1 10*3/uL (ref 0.0–0.7)
Lymphocytes Relative: 10 % — ABNORMAL LOW (ref 12–46)
Lymphs Abs: 0.9 10*3/uL (ref 0.7–4.0)
Neutrophils Relative %: 80 % — ABNORMAL HIGH (ref 43–77)
Platelets: 179 10*3/uL (ref 150–400)
RBC: 4.71 MIL/uL (ref 4.22–5.81)
RDW: 14 % (ref 11.5–15.5)
WBC: 8.8 10*3/uL (ref 4.0–10.5)

## 2011-01-20 LAB — TSH: TSH: 1.79 u[IU]/mL (ref 0.350–4.500)

## 2011-01-20 LAB — HEMOGLOBIN A1C
Hgb A1c MFr Bld: 5.5 % (ref <5.7)
Mean Plasma Glucose: 111 mg/dL (ref <117)

## 2011-01-20 MED ORDER — BUPROPION HCL 75 MG PO TABS: 75.0000 mg | ORAL_TABLET | Freq: Two times a day (BID) | ORAL | Status: DC

## 2011-01-20 NOTE — Patient Instructions (Signed)
Take TWO of the Bupropion daily.

## 2011-01-20 NOTE — Progress Notes (Signed)
Addended by: Candie Echevaria L on: 01/20/2011 05:11 PM   Modules accepted: Orders

## 2011-01-20 NOTE — Progress Notes (Signed)
Subjective:    Patient ID: [name], male    DOB: 1924-08-29, 77 y.o.   MRN: [id number]  HPI Mental Health Symptoms: Onset:1 year or >, since TIA Anxiety:yes Depression:yes (he denies) Loss of interest (Anhedonia):yes  Ex.not reading ,no interest in [facility] 2 collection, staying @ home except for appts Panic attacks:yes; note: fearful of walking ; fear of possible injury resulting in knee surgery Insomnia:chronic issue Anorexia:no                                                                                                                                                                                                 Fatigue:yes Neurologic signs/symptoms: Headache, numbness and tingling, weakness:no Endocrinologic signs and symptoms: Hoarseness, weight change, vision change,  bowel changes, skin/hair/nail changes: Family history of mental health issues, alcoholism or drug abuse:no Medications/efficacy:Wellbutrin ? partially beneficial   Note: "yes" answers were from daughter    Review of Systems meds control constipation     Objective:   Physical Exam  Gen.: Healthy and well-nourished in appearance. Flat affect but  cooperative throughout exam. Head: Normocephalic without obvious abnormalities. Eyes: No corneal or conjunctival inflammation noted. Extraocular motion intact. No lid lag Ears: Hearing is grossly decreased l bilaterally. Neck: No deformities, masses, or tenderness noted. Range of motion &. Thyroid normal. Lungs: Normal respiratory effort; chest expands symmetrically. Lungs are clear to auscultation without rales, wheezes, or increased work of breathing. Heart: Normal rate and rhythm. Normal S1 and S2. No gallop, click, or rub. No murmur. No clubbing, cyanosis, edema, or deformity noted. Range of motion  normal .Tone & strength  normal.Joints normal. Nail health  good. Vascular: Carotid, radial artery, dorsalis pedis and dorsalis posterior tibial pulses are full and  equal. No bruits present. Neurologic: Alert and oriented x3."WORLD" spelled backwards.Recall good.  He knew daughter's birthday & his anniversary.Fine hand tremor.  Skin: Intact without suspicious lesions or rashes. Lymph: No cervical or  axillary lymphadenopathy present. Psych: Mood and affect decreased. Normally interactive                                                                                         Assessment & Plan:  #1 fatigue    ; #2 chronic sleep disorder; #3  depression. Plan: see Orders & recommendations

## 2011-01-21 LAB — BASIC METABOLIC PANEL
CO2: 26 mEq/L (ref 19–32)
Calcium: 9.3 mg/dL (ref 8.4–10.5)
Chloride: 108 mEq/L (ref 96–112)
Sodium: 142 mEq/L (ref 135–145)

## 2011-01-21 LAB — LIPID PANEL
Cholesterol: 144 mg/dL (ref 0–200)
HDL: 36 mg/dL — ABNORMAL LOW (ref >39)
Triglycerides: 125 mg/dL (ref <150)

## 2011-01-30 ENCOUNTER — Telehealth: Payer: Self-pay | Admitting: Internal Medicine

## 2011-01-30 MED ORDER — CLONAZEPAM 0.5 MG PO TABS: ORAL_TABLET | ORAL | Status: DC

## 2011-01-30 NOTE — Telephone Encounter (Signed)
Patient said they called pharmacy 3 times - needs refill [facility]am [facility] [address]

## 2011-01-31 ENCOUNTER — Ambulatory Visit: Payer: Medicare Other | Attending: Internal Medicine | Admitting: Physical Therapy

## 2011-01-31 DIAGNOSIS — R293 Abnormal posture: Secondary | ICD-10-CM | POA: Insufficient documentation

## 2011-01-31 DIAGNOSIS — M6281 Muscle weakness (generalized): Secondary | ICD-10-CM | POA: Insufficient documentation

## 2011-01-31 DIAGNOSIS — R269 Unspecified abnormalities of gait and mobility: Secondary | ICD-10-CM | POA: Insufficient documentation

## 2011-01-31 DIAGNOSIS — IMO0001 Reserved for inherently not codable concepts without codable children: Secondary | ICD-10-CM | POA: Insufficient documentation

## 2011-02-06 ENCOUNTER — Ambulatory Visit (INDEPENDENT_AMBULATORY_CARE_PROVIDER_SITE_OTHER): Payer: 59 | Admitting: *Deleted

## 2011-02-06 DIAGNOSIS — E538 Deficiency of other specified B group vitamins: Secondary | ICD-10-CM

## 2011-02-06 MED ORDER — CYANOCOBALAMIN 1000 MCG/ML IJ SOLN
1000.0000 ug | Freq: Once | INTRAMUSCULAR | Status: AC
  Administered 2011-02-06: 1000 ug via INTRAMUSCULAR

## 2011-02-09 ENCOUNTER — Ambulatory Visit: Payer: Medicare Other | Admitting: Physical Therapy

## 2011-02-14 ENCOUNTER — Ambulatory Visit: Payer: Medicare Other | Admitting: Physical Therapy

## 2011-02-16 ENCOUNTER — Encounter: Payer: 59 | Admitting: Physical Therapy

## 2011-02-21 ENCOUNTER — Ambulatory Visit: Payer: Medicare Other | Attending: Internal Medicine | Admitting: Physical Therapy

## 2011-02-21 DIAGNOSIS — R293 Abnormal posture: Secondary | ICD-10-CM | POA: Insufficient documentation

## 2011-02-21 DIAGNOSIS — M6281 Muscle weakness (generalized): Secondary | ICD-10-CM | POA: Insufficient documentation

## 2011-02-21 DIAGNOSIS — R269 Unspecified abnormalities of gait and mobility: Secondary | ICD-10-CM | POA: Insufficient documentation

## 2011-02-21 DIAGNOSIS — IMO0001 Reserved for inherently not codable concepts without codable children: Secondary | ICD-10-CM | POA: Insufficient documentation

## 2011-02-24 ENCOUNTER — Ambulatory Visit: Payer: Medicare Other | Admitting: Physical Therapy

## 2011-02-28 ENCOUNTER — Ambulatory Visit: Payer: Medicare Other | Admitting: Physical Therapy

## 2011-02-28 ENCOUNTER — Other Ambulatory Visit: Payer: Self-pay | Admitting: Internal Medicine

## 2011-03-02 ENCOUNTER — Ambulatory Visit: Payer: Medicare Other | Admitting: Physical Therapy

## 2011-03-07 ENCOUNTER — Ambulatory Visit: Payer: Medicare Other | Admitting: Physical Therapy

## 2011-03-09 ENCOUNTER — Ambulatory Visit: Payer: Medicare Other | Admitting: Physical Therapy

## 2011-03-17 ENCOUNTER — Other Ambulatory Visit: Payer: Self-pay | Admitting: Internal Medicine

## 2011-03-17 MED ORDER — CLONAZEPAM 0.5 MG PO TABS: ORAL_TABLET | ORAL | Status: DC

## 2011-03-17 NOTE — Telephone Encounter (Signed)
RX filled on 01/30/2011 #90 with 1 refill. I called the pharmacy and was told rx was picked up on the 11 th of June and refill was picked up on the 4 th of July    Per Dr.Hopper give # 90 with no refills (thats how we will fill med every month) . RX faxed

## 2011-03-21 ENCOUNTER — Ambulatory Visit: Payer: Medicare Other | Admitting: Physical Therapy

## 2011-03-21 ENCOUNTER — Encounter: Payer: Self-pay | Admitting: Internal Medicine

## 2011-03-22 ENCOUNTER — Ambulatory Visit (INDEPENDENT_AMBULATORY_CARE_PROVIDER_SITE_OTHER): Payer: 59 | Admitting: *Deleted

## 2011-03-22 DIAGNOSIS — E538 Deficiency of other specified B group vitamins: Secondary | ICD-10-CM

## 2011-03-22 MED ORDER — CYANOCOBALAMIN 1000 MCG/ML IJ SOLN
1000.0000 ug | Freq: Once | INTRAMUSCULAR | Status: AC
  Administered 2011-03-22: 1000 ug via INTRAMUSCULAR

## 2011-03-28 ENCOUNTER — Ambulatory Visit: Payer: Medicare Other | Attending: Internal Medicine | Admitting: Physical Therapy

## 2011-03-28 DIAGNOSIS — R269 Unspecified abnormalities of gait and mobility: Secondary | ICD-10-CM | POA: Insufficient documentation

## 2011-03-28 DIAGNOSIS — IMO0001 Reserved for inherently not codable concepts without codable children: Secondary | ICD-10-CM | POA: Insufficient documentation

## 2011-03-28 DIAGNOSIS — M6281 Muscle weakness (generalized): Secondary | ICD-10-CM | POA: Insufficient documentation

## 2011-03-28 DIAGNOSIS — R293 Abnormal posture: Secondary | ICD-10-CM | POA: Insufficient documentation

## 2011-04-04 ENCOUNTER — Ambulatory Visit: Payer: Medicare Other | Admitting: Physical Therapy

## 2011-04-12 ENCOUNTER — Other Ambulatory Visit: Payer: Self-pay | Admitting: Internal Medicine

## 2011-04-13 MED ORDER — CLONAZEPAM 0.5 MG PO TABS
ORAL_TABLET | ORAL | Status: DC
Start: 2011-04-13 — End: 2011-05-11

## 2011-04-13 NOTE — Telephone Encounter (Signed)
RX sent to [facility]

## 2011-05-09 ENCOUNTER — Other Ambulatory Visit: Payer: Self-pay | Admitting: Internal Medicine

## 2011-05-09 NOTE — Telephone Encounter (Signed)
Dr.Hopper please advise on controlled med given on 04/13/2011

## 2011-05-10 NOTE — Telephone Encounter (Signed)
I called and informed patient's wife of Dr.Hopper's response, she ok'd and said if her husband has a problem with Dr.Hopper's response they will call back to schedule appointment

## 2011-05-10 NOTE — Telephone Encounter (Signed)
This can not be refilled until 9/23; it is a controlled substance & was last refilled 8/23 #90

## 2011-05-11 ENCOUNTER — Ambulatory Visit (INDEPENDENT_AMBULATORY_CARE_PROVIDER_SITE_OTHER): Payer: 59 | Admitting: Internal Medicine

## 2011-05-11 DIAGNOSIS — T887XXA Unspecified adverse effect of drug or medicament, initial encounter: Secondary | ICD-10-CM

## 2011-05-11 DIAGNOSIS — G4700 Insomnia, unspecified: Secondary | ICD-10-CM

## 2011-05-11 DIAGNOSIS — G5790 Unspecified mononeuropathy of unspecified lower limb: Secondary | ICD-10-CM

## 2011-05-11 DIAGNOSIS — R7309 Other abnormal glucose: Secondary | ICD-10-CM

## 2011-05-11 MED ORDER — CLONAZEPAM 0.5 MG PO TABS
ORAL_TABLET | ORAL | Status: DC
Start: 2011-05-11 — End: 2011-06-09

## 2011-05-11 NOTE — Patient Instructions (Signed)
Results will be sent to you for your home reference file.

## 2011-05-11 NOTE — Progress Notes (Signed)
  Subjective:    Patient ID: [name], male    DOB: 03-02-1925, 77 y.o.   MRN: [id number]  HPI Insomnia: 2 Clonazepam @  [facility] has been beneficial, ? 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  Since physical therapy has been having pain in his feet, and exacerbation of pre-existing peripheral neuropathy     Objective:   Physical Exam   there is no dramatic improvement in his affect; he is alert, communicative and interactive appropriately.  Thyroid is normal to palpation  Heart is regular with an S4; there is a grade 1 systolic murmur  Deep tendon reflexes are 1+  Strength is good in all extremities. He does wear brace on his left knee        Assessment & Plan:  #1 debilitation, dramatically improved  #2 chronic insomnia stable on present medications  #3 neuropathic foot pain  Plan: No change in present schedule.

## 2011-05-12 LAB — TSH: TSH: 1.94 u[IU]/mL (ref 0.35–5.50)

## 2011-05-12 LAB — AST: AST: 21 U/L (ref 0–37)

## 2011-05-12 LAB — HEMOGLOBIN A1C: Hgb A1c MFr Bld: 5.7 % (ref 4.6–6.5)

## 2011-05-12 LAB — ALT: ALT: 18 U/L (ref 0–53)

## 2011-06-08 ENCOUNTER — Other Ambulatory Visit: Payer: Self-pay | Admitting: Internal Medicine

## 2011-06-08 DIAGNOSIS — G4700 Insomnia, unspecified: Secondary | ICD-10-CM

## 2011-06-08 NOTE — Telephone Encounter (Signed)
Pt is requesting refill of clonazepam .[facility] [address]

## 2011-06-09 MED ORDER — CLONAZEPAM 0.5 MG PO TABS: ORAL_TABLET | ORAL | Status: DC

## 2011-06-09 NOTE — Telephone Encounter (Signed)
Spoke with patient's wife, patient did NOT request 5 additional refills. Patient would like 1-2 refills

## 2011-06-19 ENCOUNTER — Other Ambulatory Visit: Payer: Self-pay | Admitting: Internal Medicine

## 2011-07-31 ENCOUNTER — Other Ambulatory Visit: Payer: Self-pay | Admitting: Internal Medicine

## 2011-07-31 DIAGNOSIS — G4700 Insomnia, unspecified: Secondary | ICD-10-CM

## 2011-07-31 MED ORDER — CLONAZEPAM 0.5 MG PO TABS: ORAL_TABLET | ORAL | Status: DC

## 2011-07-31 NOTE — Telephone Encounter (Signed)
RX sent

## 2011-08-19 ENCOUNTER — Other Ambulatory Visit: Payer: Self-pay | Admitting: Internal Medicine

## 2011-09-26 ENCOUNTER — Other Ambulatory Visit: Payer: Self-pay | Admitting: Internal Medicine

## 2011-09-26 ENCOUNTER — Other Ambulatory Visit: Payer: Self-pay | Admitting: *Deleted

## 2011-09-26 DIAGNOSIS — G4700 Insomnia, unspecified: Secondary | ICD-10-CM

## 2011-09-26 MED ORDER — CLONAZEPAM 0.5 MG PO TABS: ORAL_TABLET | ORAL | Status: DC

## 2011-09-26 NOTE — Telephone Encounter (Signed)
RX sent

## 2011-09-26 NOTE — Telephone Encounter (Signed)
Last OV : 05/01/2011, last filled 07/2011 #90 with 1 refill,   1.) Dr.Hopper please advise if ok to fill? 2.) Warning giving Caution in geriatric patient's, is it ok to override warning now and in the future?

## 2011-09-26 NOTE — Telephone Encounter (Signed)
Pt wife called to advise that the medication for clonazepam had not been sent to pharmacy advised I will look into resolving the concern per pt is almost out, noted in the chart that the medication had been sent for the pt, spoke to [facility] nurse and was advised that she called in [facility] [address], called pt back to advise the rx had been called into the pharmacy and if pt has any further questions to please call office

## 2011-09-26 NOTE — Telephone Encounter (Signed)
Okay to refill; this is an issue that he and I discussed in detail. He knows the risk of overuse of this medication. I have also had him evaluated by another physician because of my concerns.

## 2011-10-16 ENCOUNTER — Other Ambulatory Visit: Payer: Self-pay | Admitting: Internal Medicine

## 2011-11-13 ENCOUNTER — Other Ambulatory Visit: Payer: Self-pay | Admitting: Internal Medicine

## 2011-11-13 DIAGNOSIS — G4700 Insomnia, unspecified: Secondary | ICD-10-CM

## 2011-11-13 NOTE — Telephone Encounter (Signed)
Medication to be taken on in  morning and 1-2  in the evening prn only. ?got 90 on  09/26/11 with one refill. If so medication but could not be refilled until 3/1  This medication can cause sedation and has high risk of increasing  falling

## 2011-11-13 NOTE — Telephone Encounter (Signed)
Last filled 09/26/11 #90/1 refill, Dr.Hopper please advise

## 2011-11-13 NOTE — Telephone Encounter (Signed)
Refill for Clonazepam 0.5 MG Tablet Take 1-tablet in the morning & 2-tablets at bed time  Last written 2.5.13 Last qty written 90

## 2011-11-15 MED ORDER — CLONAZEPAM 0.5 MG PO TABS: ORAL_TABLET | ORAL | Status: DC

## 2011-11-15 NOTE — Telephone Encounter (Signed)
Per Dr.Hopper ok to give #90 with no refills . RX sent

## 2011-12-11 ENCOUNTER — Other Ambulatory Visit: Payer: Self-pay | Admitting: Internal Medicine

## 2011-12-11 DIAGNOSIS — G4700 Insomnia, unspecified: Secondary | ICD-10-CM

## 2011-12-11 MED ORDER — CLONAZEPAM 0.5 MG PO TABS: ORAL_TABLET | ORAL | Status: DC

## 2011-12-11 NOTE — Telephone Encounter (Signed)
RX sent

## 2011-12-11 NOTE — Telephone Encounter (Signed)
Refill for Clonazepam 0.5MG  Tablet Take 1-tablet in the morning and 2-tablets at bedtime  Last written 3.27.2013 Last qty written 90  Last OV 9.20.2012

## 2011-12-21 ENCOUNTER — Encounter: Payer: Self-pay | Admitting: Internal Medicine

## 2011-12-21 ENCOUNTER — Ambulatory Visit (INDEPENDENT_AMBULATORY_CARE_PROVIDER_SITE_OTHER): Payer: Medicare Other | Admitting: Internal Medicine

## 2011-12-21 VITALS — BP 126/70 | HR 87 | Temp 97.7°F | Wt 198.6 lb

## 2011-12-21 DIAGNOSIS — Z862 Personal history of diseases of the blood and blood-forming organs and certain disorders involving the immune mechanism: Secondary | ICD-10-CM

## 2011-12-21 DIAGNOSIS — R42 Dizziness and giddiness: Secondary | ICD-10-CM

## 2011-12-21 NOTE — Progress Notes (Signed)
  Subjective:    Patient ID: OVIE CORNELIO, male    DOB: [birth year], 78 y.o.   MRN: [id number]  HPI He's had 2 episodes of dizziness. The first occurred 4/15 as he was starting to get out of bed. He felt as if he were spinning to his left. He had a second episode as he sat up and put his feet on the floor, PRIOR to  standing up 4/30. He had a similar issue several years ago.  He had no cardiac prodrome of tachycardia, dysrhythmia, or palpitations prior to the event. He also denied any headache, numbness or tingling or extremity weakness prior to the symptoms.  The symptoms resolved after he sat at rest for 5 minutes. There was no other treatment introduced  He has chronic hearing loss; he had no tinnitus with this episode. Also he had no GERD vision, double vision, or loss of vision.  Because he is on medications which can be sedating; he has stopped drinking completely Past medical history/family history/social history were all reviewed and updated. Pertinent data: He was evaluated for presyncope in August 2011. ECHO was essentially negative. Cardiac monitor revealed bradycardia with rates into the 40s "?while asleep" PMH of anemia.No significant findings. His brother had a stroke at age 66.   Review of Systems  Benign positional vertigo symptoms:occasionally for seconds Not related to straining or pain No syncope (see above) or seizure activity No associated upper respiratory tract infection/extrinsic symptoms No nausea/sweating,chest pain, or dyspnea  He is not having melena or rectal bleeding. Constipation has resolved with medications     Objective:   Physical Exam  Gen. appearance: Well-nourished, in no distress.Appears younger than stated age  Eyes: Extraocular motion intact, field of vision normal, vision grossly intact, no nystagmus ENT: Canals have some wax, tympanic membranes partially obscured,  hearing grossly decreased Neck: Decreased  range of motion, no masses, normal  thyroid Cardiovascular: Rate and rhythm normal; no murmurs, gallops .S4 with slurring   Muscle skeletal: Increased  tone,   strength normal. Knee brace worn Neuro:no cranial nerve deficit, deep tendon  reflexes 0+ @ knees, gait rigid w/o retropulsion. Bilateral hand tremor. Finger to nose otherwise normal. Romberg negative Lymph: No cervical or axillary LA Skin: Warm and dry without suspicious lesions or rashes Psych: no anxiety or mood change. Normally interactive and cooperative. Decreased auditory  acuity hinders communication. He became upset upon return from Lab because I was not Rxing any meds. He repeatedly said he would not  be able to wear support hose. He did not understand concept of Physical Therapy addressing possible BPV component       Assessment & Plan:  #1 dizziness, probably both postural hypotension & benign  positional vertigo components  #2 past history of anemia; R/O significant anemia or electrolyte imbalance contributing to symptoms  #3 polypharmacy with increased risk. My concern in reference to sedative meds @ significant doses has been discussed in detail.  Plan: See orders and recommendations. Evaluation by Dr Porfirio Mylar Dohmier may be of benefit to evaluate dizziness & chronic sleep issues which have required significant doses of Clonazepam for control ; a situation which makes me very uncomfortable as I have relayed to Mr & Mrs Watrous.

## 2011-12-21 NOTE — Patient Instructions (Addendum)
Repeat the isometric exercises discussed 4- 5 times prior to standing if you've been in bed or seated for a period of time.   [facility] 02/11/2012:  Home Health Certification  & Plan of Care for 6/5- 03/23/12  reviewed & completed. No discrepancies noted between EMR Med List & meds listed on form . Discharge Summary 01/23/12 reviewed. Cephalexin was Rxed short term @ D/C for coag neg Staph on single blood culture, considered a contaminant. Benzodiazepine weaning was recommended by Clinical Pharmacist.I concur , but this has been discussed on multiple occasions resulting in very emotional responses. Such risk is greatest with polypharmacy especially among  geriatric patients as has been documented repeatedly by Dr. Haig Prophet and his associates in multiple long-term studies.  It is unrealistic that this can be accomplished unless he has   recurrent falls.He has incapacitating  sleep issues for which subspecialty evaluation has been pursued.  Brief submitted 03/03/12

## 2011-12-22 LAB — CBC WITH DIFFERENTIAL/PLATELET
Basophils Relative: 0.8 % (ref 0.0–3.0)
Eosinophils Relative: 0.8 % (ref 0.0–5.0)
Hemoglobin: 13.5 g/dL (ref 13.0–17.0)
Lymphocytes Relative: 40.4 % (ref 12.0–46.0)
Monocytes Relative: 20.8 % — ABNORMAL HIGH (ref 3.0–12.0)
Neutro Abs: 1 10*3/uL — ABNORMAL LOW (ref 1.4–7.7)
RBC: 4.54 Mil/uL (ref 4.22–5.81)

## 2011-12-22 LAB — BASIC METABOLIC PANEL
Calcium: 8.9 mg/dL (ref 8.4–10.5)
GFR: 71.32 mL/min (ref >60.00)
Sodium: 143 mEq/L (ref 135–145)

## 2011-12-23 ENCOUNTER — Encounter: Payer: Self-pay | Admitting: Internal Medicine

## 2012-01-01 ENCOUNTER — Telehealth: Payer: Self-pay

## 2012-01-01 NOTE — Telephone Encounter (Signed)
Patient's daughter called to follow-up on last visit 12/21/11, patient was told to do isometric exercises in which he doesn't understand. I explained the process of isometric exercises and informed daughter that google will have a demonstration available (if access to Internet). Patient's daughters states she has access to the Internet and based on my explanation of isometrics she thinks she understands.    Patient's daughter also questioned if there was a different kind of stockings available, her father is having a hard time putting stockings on. I gave her the number to [facility] to further f/u on different types/styles of stocking for Dr.Hopper has recommended the easiest pair (patient mentioned at [facility] that he had a hard time before, this was taking in consideration when Dr.Hopper gave rx for stockings).  Patient's daughter was informed to have [facility] contact us back if something in addition is needed.

## 2012-01-04 ENCOUNTER — Telehealth: Payer: Self-pay | Admitting: Internal Medicine

## 2012-01-04 DIAGNOSIS — G4700 Insomnia, unspecified: Secondary | ICD-10-CM

## 2012-01-04 MED ORDER — CLONAZEPAM 0.5 MG PO TABS: ORAL_TABLET | ORAL | Status: DC

## 2012-01-04 NOTE — Telephone Encounter (Signed)
Refill: Clonazepam 0.5mg  tablet. Take 1 tablet in the morning and 2 tablets at bedtime.

## 2012-01-08 ENCOUNTER — Ambulatory Visit: Payer: Medicare Other | Admitting: Physical Therapy

## 2012-01-11 ENCOUNTER — Ambulatory Visit: Payer: Medicare Other | Admitting: Physical Therapy

## 2012-01-17 ENCOUNTER — Ambulatory Visit: Payer: Medicare Other | Attending: Internal Medicine | Admitting: Physical Therapy

## 2012-01-17 DIAGNOSIS — IMO0001 Reserved for inherently not codable concepts without codable children: Secondary | ICD-10-CM | POA: Insufficient documentation

## 2012-01-17 DIAGNOSIS — R269 Unspecified abnormalities of gait and mobility: Secondary | ICD-10-CM | POA: Insufficient documentation

## 2012-01-17 DIAGNOSIS — H8110 Benign paroxysmal vertigo, unspecified ear: Secondary | ICD-10-CM | POA: Insufficient documentation

## 2012-01-20 ENCOUNTER — Inpatient Hospital Stay (HOSPITAL_COMMUNITY)
Admission: EM | Admit: 2012-01-20 | Discharge: 2012-01-23 | DRG: 872 | Disposition: A | Discharge status: Home Health | Payer: Medicare Other | Attending: Internal Medicine | Admitting: Internal Medicine

## 2012-01-20 ENCOUNTER — Encounter (HOSPITAL_COMMUNITY): Payer: Self-pay | Admitting: Emergency Medicine

## 2012-01-20 ENCOUNTER — Emergency Department (HOSPITAL_COMMUNITY): Payer: Medicare Other

## 2012-01-20 DIAGNOSIS — F329 Major depressive disorder, single episode, unspecified: Secondary | ICD-10-CM

## 2012-01-20 DIAGNOSIS — Z96659 Presence of unspecified artificial knee joint: Secondary | ICD-10-CM

## 2012-01-20 DIAGNOSIS — A413 Sepsis due to Hemophilus influenzae: Secondary | ICD-10-CM

## 2012-01-20 DIAGNOSIS — R351 Nocturia: Secondary | ICD-10-CM

## 2012-01-20 DIAGNOSIS — I951 Orthostatic hypotension: Secondary | ICD-10-CM | POA: Diagnosis present

## 2012-01-20 DIAGNOSIS — D649 Anemia, unspecified: Secondary | ICD-10-CM

## 2012-01-20 DIAGNOSIS — Z85828 Personal history of other malignant neoplasm of skin: Secondary | ICD-10-CM

## 2012-01-20 DIAGNOSIS — N390 Urinary tract infection, site not specified: Secondary | ICD-10-CM | POA: Diagnosis present

## 2012-01-20 DIAGNOSIS — G4700 Insomnia, unspecified: Secondary | ICD-10-CM

## 2012-01-20 DIAGNOSIS — F29 Unspecified psychosis not due to a substance or known physiological condition: Secondary | ICD-10-CM

## 2012-01-20 DIAGNOSIS — R9431 Abnormal electrocardiogram [ECG] [EKG]: Secondary | ICD-10-CM

## 2012-01-20 DIAGNOSIS — Z860100 Personal history of colon polyps, unspecified: Secondary | ICD-10-CM

## 2012-01-20 DIAGNOSIS — R269 Unspecified abnormalities of gait and mobility: Secondary | ICD-10-CM | POA: Diagnosis present

## 2012-01-20 DIAGNOSIS — R7309 Other abnormal glucose: Secondary | ICD-10-CM

## 2012-01-20 DIAGNOSIS — N400 Enlarged prostate without lower urinary tract symptoms: Secondary | ICD-10-CM | POA: Diagnosis present

## 2012-01-20 DIAGNOSIS — I10 Essential (primary) hypertension: Secondary | ICD-10-CM | POA: Diagnosis present

## 2012-01-20 DIAGNOSIS — R279 Unspecified lack of coordination: Secondary | ICD-10-CM

## 2012-01-20 DIAGNOSIS — F3289 Other specified depressive episodes: Secondary | ICD-10-CM | POA: Diagnosis present

## 2012-01-20 DIAGNOSIS — E782 Mixed hyperlipidemia: Secondary | ICD-10-CM

## 2012-01-20 DIAGNOSIS — R5381 Other malaise: Secondary | ICD-10-CM | POA: Diagnosis present

## 2012-01-20 DIAGNOSIS — K5900 Constipation, unspecified: Secondary | ICD-10-CM

## 2012-01-20 DIAGNOSIS — R5383 Other fatigue: Secondary | ICD-10-CM | POA: Diagnosis present

## 2012-01-20 DIAGNOSIS — R42 Dizziness and giddiness: Secondary | ICD-10-CM

## 2012-01-20 DIAGNOSIS — E785 Hyperlipidemia, unspecified: Secondary | ICD-10-CM | POA: Diagnosis present

## 2012-01-20 DIAGNOSIS — A419 Sepsis, unspecified organism: Secondary | ICD-10-CM | POA: Diagnosis present

## 2012-01-20 DIAGNOSIS — G250 Essential tremor: Secondary | ICD-10-CM

## 2012-01-20 DIAGNOSIS — A4151 Sepsis due to Escherichia coli [E. coli]: Principal | ICD-10-CM | POA: Diagnosis present

## 2012-01-20 LAB — DIFFERENTIAL
Basophils Absolute: 0 10*3/uL (ref 0.0–0.1)
Eosinophils Absolute: 0 10*3/uL (ref 0.0–0.7)
Lymphs Abs: 0.2 10*3/uL — ABNORMAL LOW (ref 0.7–4.0)
Neutrophils Relative %: 94 % — ABNORMAL HIGH (ref 43–77)

## 2012-01-20 LAB — CBC
MCH: 29.7 pg (ref 26.0–34.0)
Platelets: 165 10*3/uL (ref 150–400)
RBC: 4.55 MIL/uL (ref 4.22–5.81)
WBC: 16.1 10*3/uL — ABNORMAL HIGH (ref 4.0–10.5)

## 2012-01-20 LAB — URINALYSIS, ROUTINE W REFLEX MICROSCOPIC
Nitrite: POSITIVE — AB
Specific Gravity, Urine: 1.02 (ref 1.005–1.030)
Urobilinogen, UA: 1 mg/dL (ref 0.0–1.0)

## 2012-01-20 LAB — BASIC METABOLIC PANEL
GFR calc Af Amer: 72 mL/min — ABNORMAL LOW (ref >90)
GFR calc non Af Amer: 62 mL/min — ABNORMAL LOW (ref >90)
Glucose, Bld: 133 mg/dL — ABNORMAL HIGH (ref 70–99)
Potassium: 4.1 mEq/L (ref 3.5–5.1)
Sodium: 140 mEq/L (ref 135–145)

## 2012-01-20 LAB — LACTIC ACID, PLASMA: Lactic Acid, Venous: 2.4 mmol/L — ABNORMAL HIGH (ref 0.5–2.2)

## 2012-01-20 LAB — OCCULT BLOOD, POC DEVICE: Fecal Occult Bld: POSITIVE

## 2012-01-20 MED ORDER — METANX 3-35-2 MG PO TABS: 1.0000 | ORAL_TABLET | Freq: Two times a day (BID) | ORAL | Status: DC

## 2012-01-20 MED ORDER — L-METHYLFOLATE-B6-B12 3-35-2 MG PO TABS
1.0000 | ORAL_TABLET | Freq: Two times a day (BID) | ORAL | Status: DC
  Administered 2012-01-20 – 2012-01-23 (×6): 1 via ORAL
  Filled 2012-01-20 (×7): qty 1

## 2012-01-20 MED ORDER — ASPIRIN EC 81 MG PO TBEC
81.0000 mg | DELAYED_RELEASE_TABLET | Freq: Every day | ORAL | Status: DC
  Administered 2012-01-20 – 2012-01-23 (×4): 81 mg via ORAL
  Filled 2012-01-20 (×4): qty 1

## 2012-01-20 MED ORDER — SODIUM CHLORIDE 0.9 % IV SOLN: INTRAVENOUS | Status: DC

## 2012-01-20 MED ORDER — ALUM & MAG HYDROXIDE-SIMETH 200-200-20 MG/5ML PO SUSP: 30.0000 mL | Freq: Four times a day (QID) | ORAL | Status: DC | PRN

## 2012-01-20 MED ORDER — ASPIRIN 81 MG PO TABS: 81.0000 mg | ORAL_TABLET | Freq: Every day | ORAL | Status: DC

## 2012-01-20 MED ORDER — ACETAMINOPHEN 650 MG RE SUPP: 650.0000 mg | Freq: Four times a day (QID) | RECTAL | Status: DC | PRN

## 2012-01-20 MED ORDER — DOXAZOSIN MESYLATE 1 MG PO TABS
1.0000 mg | ORAL_TABLET | Freq: Every day | ORAL | Status: DC
  Administered 2012-01-20 – 2012-01-22 (×3): 1 mg via ORAL
  Filled 2012-01-20 (×4): qty 1

## 2012-01-20 MED ORDER — ACETAMINOPHEN 325 MG PO TABS: 650.0000 mg | ORAL_TABLET | Freq: Four times a day (QID) | ORAL | Status: DC | PRN

## 2012-01-20 MED ORDER — SODIUM CHLORIDE 0.9 % IV BOLUS (SEPSIS)
500.0000 mL | Freq: Once | INTRAVENOUS | Status: AC
  Administered 2012-01-20: 500 mL via INTRAVENOUS

## 2012-01-20 MED ORDER — ONDANSETRON HCL 4 MG/2ML IJ SOLN: 4.0000 mg | Freq: Four times a day (QID) | INTRAMUSCULAR | Status: DC | PRN

## 2012-01-20 MED ORDER — ACETAMINOPHEN 325 MG PO TABS
650.0000 mg | ORAL_TABLET | Freq: Once | ORAL | Status: AC
  Administered 2012-01-20: 650 mg via ORAL
  Filled 2012-01-20: qty 2

## 2012-01-20 MED ORDER — CLONAZEPAM 0.5 MG PO TABS
0.5000 mg | ORAL_TABLET | Freq: Two times a day (BID) | ORAL | Status: DC
  Administered 2012-01-20 – 2012-01-23 (×5): 0.5 mg via ORAL
  Filled 2012-01-20 (×5): qty 1

## 2012-01-20 MED ORDER — BUPROPION HCL 75 MG PO TABS
75.0000 mg | ORAL_TABLET | Freq: Two times a day (BID) | ORAL | Status: DC
  Administered 2012-01-20 – 2012-01-23 (×6): 75 mg via ORAL
  Filled 2012-01-20 (×7): qty 1

## 2012-01-20 MED ORDER — SODIUM CHLORIDE 0.9 % IV SOLN
INTRAVENOUS | Status: DC
  Administered 2012-01-21: 1000 mL via INTRAVENOUS
  Administered 2012-01-21 – 2012-01-22 (×2): via INTRAVENOUS

## 2012-01-20 MED ORDER — HEPARIN SODIUM (PORCINE) 5000 UNIT/ML IJ SOLN
5000.0000 [IU] | Freq: Three times a day (TID) | INTRAMUSCULAR | Status: DC
  Administered 2012-01-20 – 2012-01-23 (×8): 5000 [IU] via SUBCUTANEOUS
  Filled 2012-01-20 (×11): qty 1

## 2012-01-20 MED ORDER — CLONAZEPAM 0.5 MG PO TABS: 0.5000 mg | ORAL_TABLET | ORAL | Status: DC

## 2012-01-20 MED ORDER — DEXTROSE 5 % IV SOLN
1.0000 g | INTRAVENOUS | Status: DC
  Administered 2012-01-21 – 2012-01-22 (×2): 1 g via INTRAVENOUS
  Filled 2012-01-20 (×2): qty 10

## 2012-01-20 MED ORDER — DEXTROSE 5 % IV SOLN
1.0000 g | Freq: Once | INTRAVENOUS | Status: AC
  Administered 2012-01-20: 1 g via INTRAVENOUS
  Filled 2012-01-20: qty 10

## 2012-01-20 MED ORDER — ONDANSETRON HCL 4 MG PO TABS: 4.0000 mg | ORAL_TABLET | Freq: Four times a day (QID) | ORAL | Status: DC | PRN

## 2012-01-20 NOTE — ED Provider Notes (Signed)
History     CSN: [id number]  Arrival date & time 01/20/12  1545   First MD Initiated Contact with Patient 01/20/12 1555      Chief Complaint  Patient presents with  . Weakness    (Consider location/radiation/quality/duration/timing/severity/associated sxs/prior treatment) HPI Comments: Patient with h/o DM, HTN, knee replacement -- presents with a fever, generalized weakness that began this morning. Patient had 3 episodes of vomiting over a period of an hour early this afternoon. Nausea is currently resolved. Patient had a fall while urinating today. He states that he lowered himself to the floor slowly. He was immediately attended to by family. He denies pain or injury from the fall. He denies blacking out. Patient denies headache, upper respiratory tract infection symptoms, neck pain, cough, shortness of breath, chest pain, abdominal pain, diarrhea, urinary symptoms, rash. No treatments prior to arrival. Nothing makes symptoms better or worse. Course is gradually worsening.  Patient is a 78 y.o. male presenting with fever. The history is provided by the patient.  Fever Primary symptoms of the febrile illness include fever, fatigue, nausea and vomiting. Primary symptoms do not include visual change, headaches, cough, wheezing, shortness of breath, abdominal pain, diarrhea, dysuria, altered mental status, myalgias or rash. The current episode started today. This is a new problem. The problem has been gradually worsening.    Past Medical History  Diagnosis Date  . Anemia     nos  . History of colonic polyps   . Hyperlipidemia   . Hypertension   . Cancer [phone]    hx of basal cell  . Total knee replacement status     right  . BPH (benign prostatic hyperplasia)   . Anxiety attack     panic   . Pre-syncope     echo 03/2010,with EF 60%,mild RV dilation,no significant abnormalities...event monitor for 3wks/episodes os sinus bradycardia with heart rate to low 40's occasionally at  night(suspect while sleeping)    Past Surgical History  Procedure Date  . Colonoscopy w/ polypectomy 2007  . Cholecystectomy   . Total knee arthroplasty   . Mohs surgery 2010    Family History  Problem Relation Age of Onset  . Heart attack Father   . Heart failure Father 56    S/P Turp  . Heart disease Father 60    MI.Marland KitchenMarland KitchenCHF S/P TURP @ 70  . Stroke Brother 44  . Coronary artery disease Brother   . Lung cancer Brother     History  Substance Use Topics  . Smoking status: Former Smoker    Quit date: 08/22/1983  . Smokeless tobacco: Not on file  . Alcohol Use: No      Review of Systems  Constitutional: Positive for fever and fatigue.  HENT: Negative for sore throat and rhinorrhea.   Eyes: Negative for redness.  Respiratory: Negative for cough, shortness of breath and wheezing.   Cardiovascular: Negative for chest pain, palpitations and leg swelling.  Gastrointestinal: Positive for nausea and vomiting. Negative for abdominal pain, diarrhea and blood in stool.  Genitourinary: Negative for dysuria and decreased urine volume.  Musculoskeletal: Negative for myalgias.  Skin: Negative for rash.  Neurological: Negative for headaches.  Psychiatric/Behavioral: Negative for altered mental status.    Allergies  Darifenacin hydrobromide and Fluoxetine hcl  Home Medications   Current Outpatient Rx  Name Route Sig Dispense Refill  . ACETAMINOPHEN ER 650 MG PO TBCR Oral Take 650 mg by mouth Nightly.      . ASPIRIN 81 MG PO  TABS Oral Take 81 mg by mouth daily.      . BUPROPION HCL 75 MG PO TABS  TAKE 1 TABLET TWICE A DAY 60 tablet 3  . CLONAZEPAM 0.5 MG PO TABS  Take 1 tablet in am and 2 tablets at bedtime 90 tablet 0    1 in am & 1-2 qhs   . DOXAZOSIN MESYLATE 2 MG PO TABS  TAKE 1/2 TABLET BY MOUTH EVERY DAY AT BEDTIME 90 tablet 1  . GINKGO BILOBA 120 MG PO CAPS Oral Take by mouth. 1 by mouth every other day     . METANX 3-35-2 MG PO TABS Oral Take 1 tablet by mouth 2 (two)  times daily.      BP 121/68  Pulse 125  Temp(Src) 100.9 F (38.3 C) (Oral)  Wt 180 lb (81.647 kg)  SpO2 93%  Physical Exam  Nursing note and vitals reviewed. Constitutional: He appears well-developed and well-nourished.  HENT:  Head: Normocephalic and atraumatic.  Right Ear: External ear normal.  Left Ear: External ear normal.  Nose: Nose normal.  Mouth/Throat: Oropharynx is clear and moist.  Eyes: Conjunctivae are normal. Right eye exhibits no discharge. Left eye exhibits no discharge.  Neck: Normal range of motion. Neck supple. No JVD present.  Cardiovascular: Regular rhythm and normal heart sounds.  Tachycardia present.   Pulses:      Radial pulses are 2+ on the right side, and 2+ on the left side.  Pulmonary/Chest: Effort normal and breath sounds normal. No respiratory distress.  Abdominal: Soft. Bowel sounds are normal. There is no tenderness. There is no rebound and no guarding.  Musculoskeletal: He exhibits no edema and no tenderness.  Lymphadenopathy:    He has no cervical adenopathy.  Neurological: He is alert. He has normal strength. No cranial nerve deficit or sensory deficit. GCS eye subscore is 4. GCS verbal subscore is 5. GCS motor subscore is 6.  Skin: Skin is warm and dry.       Mild erythema of right chest and upper abdomen.   Psychiatric: He has a normal mood and affect.    ED Course  Procedures (including critical care time)  Labs Reviewed  CBC - Abnormal; Notable for the following:    WBC 16.1 (*)    All other components within normal limits  DIFFERENTIAL - Abnormal; Notable for the following:    Neutrophils Relative 94 (*)    Neutro Abs 15.1 (*)    Lymphocytes Relative 1 (*)    Lymphs Abs 0.2 (*)    All other components within normal limits  BASIC METABOLIC PANEL - Abnormal; Notable for the following:    Glucose, Bld 133 (*)    GFR calc non Af Amer 62 (*)    GFR calc Af Amer 72 (*)    All other components within normal limits  URINALYSIS,  ROUTINE W REFLEX MICROSCOPIC - Abnormal; Notable for the following:    APPearance CLOUDY (*)    Hgb urine dipstick SMALL (*)    Nitrite POSITIVE (*)    Leukocytes, UA MODERATE (*)    All other components within normal limits  LACTIC ACID, PLASMA - Abnormal; Notable for the following:    Lactic Acid, Venous 2.4 (*)    All other components within normal limits  URINE MICROSCOPIC-ADD ON - Abnormal; Notable for the following:    Bacteria, UA MANY (*)    All other components within normal limits  OCCULT BLOOD, POC DEVICE  URINE CULTURE  Dg Chest 2 View  01/20/2012  *RADIOLOGY REPORT*  Clinical Data: Weakness.  Status post fall  CHEST - 2 VIEW  Comparison: 10/24/2006  Findings: Heart size is normal.  No pleural effusion or edema.  No airspace consolidation identified.  Chronic left posterior and lateral rib fracture deformities are again noted and appear similar to previous exam.  IMPRESSION:  1.  No acute cardiopulmonary abnormalities.  Original Report Authenticated By: Rosealee Albee, M.D.     1. Urinary tract infection     4:10 PM Patient seen and examined. Work-up initiated. Medications ordered.   Vital signs reviewed and are as follows: Filed Vitals:   01/20/12 1551  BP: 121/68  Pulse: 125  Temp: 100.9 F (38.3 C)   4:21 PM Discussed with Dr. Rubin Payor who will see.   5:19 PM NOTE: Positive hemoccult noted in results was accidentally entered on this patient. No hemoccult has been performed.   5:50 PM Will admit for likely pyelo/UTI. Patient and family informed and they are in agreement. Fever and HR improved with tylenol and fluids. Lactate noted.   BP 120/54  Pulse 83  Temp(Src) 98.6 F (37 C) (Oral)  Resp 32  Wt 180 lb (81.647 kg)  SpO2 96%   Date: 01/20/2012  Rate: 126  Rhythm: junctional tachycardia  QRS Axis: normal  Intervals: normal  ST/T Wave abnormalities: normal  Conduction Disutrbances:none  Narrative Interpretation:   Old EKG Reviewed: changes  noted, change from NSR on 10/25/2006  6:42 PM [facility] to admit. Blood cultures ordered.    MDM  Admit for UTI in setting of fever and vomiting.         Renne Crigler, [place] 01/20/12 1843

## 2012-01-20 NOTE — ED Notes (Signed)
Warm blankets for comfort 

## 2012-01-20 NOTE — ED Notes (Signed)
[id number]<QM> Expected date:01/20/12<BR> Expected time:<BR> Means of arrival:<BR> Comments:<BR> EMS 40 GC - fall/nasuea/vomiting

## 2012-01-20 NOTE — ED Notes (Signed)
Positive occult charted in error. Occult specimen mislabel, edit sheet sent to POC for correction.

## 2012-01-20 NOTE — ED Notes (Signed)
Pt. Had a fall in the bathroom today.  Lives at home with his wife and had an episode of vomiting and weakness today prior to fall.  Pt. Also has a low grade fever.  No apparent injury from fall.  NO c/o pain or obvious deformities or lacerations.

## 2012-01-20 NOTE — H&P (Signed)
[facility]                                                                                     Patient Demographics  [name], is a 78 y.o. male  CSN: [id number]  MRN: [id number]  DOB - August 28, 1924  Admit Date - 01/20/2012  Outpatient Primary MD for the patient is Marga Melnick, MD, MD    With History of -  Past Medical History  Diagnosis Date  . Anemia     nos  . History of colonic polyps   . Hyperlipidemia   . Hypertension   . Cancer [phone]    hx of basal cell  . Total knee replacement status     right  . BPH (benign prostatic hyperplasia)   . Anxiety attack     panic   . Pre-syncope     echo 03/2010,with EF 60%,mild RV dilation,no significant abnormalities...event monitor for 3wks/episodes os sinus bradycardia with heart rate to low 40's occasionally at night(suspect while sleeping)      Past Surgical History  Procedure Date  . Colonoscopy w/ polypectomy 2007  . Cholecystectomy   . Total knee arthroplasty   . Mohs surgery 2010    in for   Chief Complaint  Patient presents with  . Weakness     HPI This is 78 y/o male who lives at home, complaining of weakness and fatigue for last 2 days, today started having fever and chills at home, and felt severly weak and almost fell, but no LOC, in ED patient had fever of 100.9, and had leucocytosis of [id number]  blood cultures were sent, and had UA done which was positive, patient denies any dysuria or poly uria, started on IV rocephin IN ED, denies any cough,chest pain,runny nose, hospitalist service were consulted to admit for IV abx.       Review of Systems    In addition to the HPI above,   Fever-chills, No Headache, No changes with Vision or hearing, No problems swallowing food or Liquids, No Chest pain, Cough or Shortness of Breath, No Abdominal pain, No Nausea or Vommitting, Bowel movements are regular, No Blood in stool or Urine, No dysuria, No new skin rashes  or bruises, No new joints pains-aches,  C/O weakness, NO tingling, numbness in any extremity, No recent weight gain or loss, No polyuria, polydypsia or polyphagia, No significant Mental Stressors.  A full 10 point Review of Systems was done, except as stated above, all other Review of Systems were negative.   Social History History  Substance Use Topics  . Smoking status: Former Smoker    Quit date: 08/22/1983  . Smokeless tobacco: Not on file  . Alcohol Use: No   Family History Family History  Problem Relation Age of Onset  . Heart attack Father   . Heart failure Father 39    S/P Turp  . Heart disease Father 71    MI.Marland KitchenMarland KitchenCHF S/P TURP @ 70  . Stroke Brother 63  . Coronary artery disease Brother   . Lung cancer Brother      Prior to Admission medications   Medication Sig Start Date End Date  Taking? Authorizing Provider  acetaminophen (RA ARTHRITIS PAIN RELIEF) 650 MG CR tablet Take 650 mg by mouth Nightly.     Yes Historical Provider, MD  aspirin 81 MG tablet Take 81 mg by mouth daily.     Yes Historical Provider, MD  buPROPion (WELLBUTRIN) 75 MG tablet Take 75 mg by mouth 2 (two) times daily.   Yes Historical Provider, MD  clonazePAM (KLONOPIN) 0.5 MG tablet Take 0.5 mg by mouth See admin instructions. Take 1 tablet in am and 2 tablets at bedtime 01/04/12  Yes Pecola Lawless, MD  doxazosin (CARDURA) 2 MG tablet TAKE 1/2 TABLET BY MOUTH EVERY DAY AT BEDTIME 06/19/11  Yes Pecola Lawless, MD  Ginkgo Biloba 120 MG CAPS Take 1 capsule by mouth every other day.    Yes Historical Provider, MD  multivitamin (METANX) 3-35-2 MG TABS tablet Take 1 tablet by mouth 2 (two) times daily.   Yes Historical Provider, MD    Allergies  Allergen Reactions  . Darifenacin Hydrobromide     REACTION: constipation  . Fluoxetine Hcl     REACTION: Very Depressed while taking    Physical Exam  Vitals  Blood pressure 120/54, pulse 83, temperature 98.6 F (37 C), temperature source Oral,  resp. rate 32, weight 81.647 kg (180 lb), SpO2 96.00%.   1. General elderlymale lying in bed in NAD,    2. Normal affect and insight, Not Suicidal or Homicidal, Awake Alert, Oriented *3.  3. No F.N deficits, ALL C.Nerves Intact, Strength 5/5 all 4 extremities, Sensation intact all 4 extremities, Plantars down going.  4. Ears and Eyes appear Normal, Conjunctivae clear, PERRLA. Moist Oral Mucosa.  5. Supple Neck, No JVD, No cervical lymphadenopathy appriciated, No Carotid Bruits.  6. Symmetrical Chest wall movement, Good air movement bilaterally, CTAB.  7. RRR, No Gallops, Rubs or Murmurs, No Parasternal Heave.  8. Positive Bowel Sounds, Abdomen Soft, Non tender, No organomegaly appriciated,       No rebound -guarding or rigidity.  9.  No Cyanosis, Normal Skin Turgor, No Skin Rash or Bruise.  10. Good muscle tone,  joints appear normal , no effusions, Normal ROM.  11. No Palpable Lymph Nodes in Neck or Axillae    Data Review  CBC  Lab 01/20/12 1650  WBC 16.1*  HGB 13.5  HCT 41.3  PLT 165  MCV 90.8  MCH 29.7  MCHC 32.7  RDW 14.3  LYMPHSABS 0.2*  MONOABS 0.7  EOSABS 0.0  BASOSABS 0.0  BANDABS --   ------------------------------------------------------------------------------------------------------------------  Chemistries   Lab 01/20/12 1650  NA 140  K 4.1  CL 104  CO2 24  GLUCOSE 133*  BUN 22  CREATININE 1.06  CALCIUM 9.2  MG --  AST --  ALT --  ALKPHOS --  BILITOT --   ------------------------------------------------------------------------------------------------------------------   ---------------------------------------------------------------------------------------------------------------  Urinalysis    Component Value Date/Time   COLORURINE YELLOW 01/20/2012 1644   APPEARANCEUR CLOUDY* 01/20/2012 1644   LABSPEC 1.020 01/20/2012 1644   PHURINE 6.0 01/20/2012 1644   GLUCOSEU NEGATIVE 01/20/2012 1644   HGBUR SMALL* 01/20/2012 1644   BILIRUBINUR  NEGATIVE 01/20/2012 1644   KETONESUR NEGATIVE 01/20/2012 1644   PROTEINUR NEGATIVE 01/20/2012 1644   UROBILINOGEN 1.0 01/20/2012 1644   NITRITE POSITIVE* 01/20/2012 1644   LEUKOCYTESUR MODERATE* 01/20/2012 1644    ----------------------------------------------------------------------------------------------------------------     Imaging results:   Dg Chest 2 View  01/20/2012  *RADIOLOGY REPORT  Clinical Data: Weakness.  Status post fall  CHEST - 2 VIEW  Comparison: 10/24/2006  Findings: Heart size is normal.  No pleural effusion or edema.  No airspace consolidation identified.  Chronic left posterior and lateral rib fracture deformities are again noted and appear similar to previous exam.  IMPRESSION:  1.  No acute cardiopulmonary abnormalities.  Original Report Authenticated By: Rosealee Albee, M.D.      Assessment & Plan  Principal Problem:  *Sepsis Active Problems:  UTI (lower urinary tract infection)  HYPERLIPIDEMIA  DEPRESSION, RECURRENT  HYPERTENSION    1. Sepsis: due to UTI, Continue with IV rocephin, follow blood and urine cultures. 2:UTI: on IV rocephin. 3:hyperlipedemia: not on statin, check lipid profile in am. 4:depression: cont with wellbutrin. 5:BPH: cont doxazosin. 6:HTN:acceptable,monitor.   DVT Prophylaxis Heparin -  [place] heparin   AM Labs Ordered, also please review Full Orders  Admission, patients condition and plan of care including tests being ordered have been discussed with the patient and family who indicate understanding and agree with the plan and Code Status.  Code Status :full code. Condition GUARDED   Randol Kern, Mellany Dinsmore M.D on 01/20/2012 at 7:56 PM   [facility] Office  [phone]

## 2012-01-21 DIAGNOSIS — R5383 Other fatigue: Secondary | ICD-10-CM

## 2012-01-21 DIAGNOSIS — R5381 Other malaise: Secondary | ICD-10-CM

## 2012-01-21 DIAGNOSIS — I9589 Other hypotension: Secondary | ICD-10-CM

## 2012-01-21 DIAGNOSIS — E782 Mixed hyperlipidemia: Secondary | ICD-10-CM

## 2012-01-21 LAB — BASIC METABOLIC PANEL
CO2: 25 mEq/L (ref 19–32)
Glucose, Bld: 140 mg/dL — ABNORMAL HIGH (ref 70–99)
Potassium: 4 mEq/L (ref 3.5–5.1)
Sodium: 137 mEq/L (ref 135–145)

## 2012-01-21 LAB — CBC
Hemoglobin: 11.4 g/dL — ABNORMAL LOW (ref 13.0–17.0)
MCH: 29.2 pg (ref 26.0–34.0)
RBC: 3.9 MIL/uL — ABNORMAL LOW (ref 4.22–5.81)

## 2012-01-21 NOTE — Progress Notes (Signed)
Subjective: Patient states that he feels better today than he had in the past 2 days. Patient states that he lives at home with his wife and is usually independent in the community level. He has no acute complaints today. I spoke with patient's daughter Okey Regal who lives in [place], and updated her on patient's condition. Specifically we discussed the patient's relative hypotension and his oral intake. I explained to the daughter that it was more important for the patient to the have an unrestricted diet since the have good oral intake and to limit his diet based on his comorbidities.  Objective: Filed Vitals:   01/21/12 0044 01/21/12 0646 01/21/12 0851 01/21/12 1416  BP: 124/74 95/55 106/55 105/53  Pulse: 78 76  59  Temp: 99.2 F (37.3 C) 98.2 F (36.8 C)  98.1 F (36.7 C)  TempSrc: Oral Oral  Oral  Resp: 16 18  18   Height:   6' (1.829 m)   Weight:      SpO2: 94% 94%  94%   Weight change:   Intake/Output Summary (Last 24 hours) at 01/21/12 1440 Last data filed at 01/21/12 1252  Gross per 24 hour  Intake    480 ml  Output    500 ml  Net    -20 ml    General: Alert, awake, oriented x3, in no acute distress. Appears his stated age HEENT: [place]/AT PEERL, EOMI Neck: Trachea midline,  no masses, no thyromegal,y no JVD, no carotid bruit OROPHARYNX:  Moist, No exudate/ erythema/lesions.  Heart: Regular rate and rhythm, without murmurs, rubs, gallops, PMI non-displaced, no heaves or thrills on palpation.  Lungs: Clear to auscultation, no wheezing or rhonchi noted. No increased vocal fremitus resonant to percussion  Abdomen: Soft, nontender, nondistended, positive bowel sounds, no masses no hepatosplenomegaly noted..  Neuro: No focal neurological deficits noted cranial nerves II through XII grossly intact.  Musculoskeletal: No warm swelling or erythema around joints, no spinal tenderness noted.   Lab Results:  Basename 01/20/12 1650  NA 140  K 4.1  CL 104  CO2 24  GLUCOSE  133*  BUN 22  CREATININE 1.06  CALCIUM 9.2  MG --  PHOS --   No results found for this basename: AST:2,ALT:2,ALKPHOS:2,BILITOT:2,PROT:2,ALBUMIN:2 in the last 72 hours No results found for this basename: LIPASE:2,AMYLASE:2 in the last 72 hours  Basename 01/21/12 0529 01/20/12 1650  WBC 14.3* 16.1*  NEUTROABS -- 15.1*  HGB 11.4* 13.5  HCT 35.5* 41.3  MCV 91.0 90.8  PLT 143* 165   No results found for this basename: CKTOTAL:3,CKMB:3,CKMBINDEX:3,TROPONINI:3 in the last 72 hours No components found with this basename: POCBNP:3 No results found for this basename: DDIMER:2 in the last 72 hours No results found for this basename: HGBA1C:2 in the last 72 hours No results found for this basename: CHOL:2,HDL:2,LDLCALC:2,TRIG:2,CHOLHDL:2,LDLDIRECT:2 in the last 72 hours No results found for this basename: TSH,T4TOTAL,FREET3,T3FREE,THYROIDAB in the last 72 hours No results found for this basename: VITAMINB12:2,FOLATE:2,FERRITIN:2,TIBC:2,IRON:2,RETICCTPCT:2 in the last 72 hours  Micro Results: No results found for this or any previous visit (from the past 240 hour(s)).  Studies/Results: Dg Chest 2 View  01/20/2012  *RADIOLOGY REPORT*  Clinical Data: Weakness.  Status post fall  CHEST - 2 VIEW  Comparison: 10/24/2006  Findings: Heart size is normal.  No pleural effusion or edema.  No airspace consolidation identified.  Chronic left posterior and lateral rib fracture deformities are again noted and appear similar to previous exam.  IMPRESSION:  1.  No acute cardiopulmonary abnormalities.  Original Report Authenticated By: Rosealee Albee, M.D.    Medications: I have reviewed the patient's current medications. Scheduled Meds:   . acetaminophen  650 mg Oral Once  . aspirin EC  81 mg Oral Daily  . buPROPion  75 mg Oral BID  . cefTRIAXone (ROCEPHIN)  IV  1 g Intravenous Once  . cefTRIAXone (ROCEPHIN)  IV  1 g Intravenous Q24H  . clonazePAM  0.5 mg Oral BID  . doxazosin  1 mg Oral QHS  .  heparin  5,000 Units Subcutaneous Q8H  . l-methylfolate-B6-B12  1 tablet Oral BID  . sodium chloride  500 mL Intravenous Once  . sodium chloride  500 mL Intravenous Once  . DISCONTD: aspirin  81 mg Oral Daily  . DISCONTD: clonazePAM  0.5 mg Oral See admin instructions  . DISCONTD: multivitamin  1 tablet Oral BID   Continuous Infusions:   . sodium chloride 1,000 mL (01/21/12 0459)  . DISCONTD: sodium chloride     PRN Meds:.acetaminophen, acetaminophen, alum & mag hydroxide-simeth, ondansetron (ZOFRAN) IV, ondansetron Assessment/Plan: Patient Active Hospital Problem List: Sepsis (01/20/2012)   Assessment: Sepsis physiology corrected. The patient is clinically improved since yesterday.   HYPERTENSION (10/19/2006)   Assessment: The patient is actually borderline hypotensive. I spoke with patient's daughter who states that they have been having problems with orthostatic hypotension with the patient and he is actually supposed to wear pressure gradient stockings. The family will bring the stockings in so the patient can rhythm while hospitalized.   UTI (lower urinary tract infection) (01/20/2012)   Assessment: Urine highly suggestive of a urinary tract infection. We'll continue the patient on Rocephin until cultures back and antibiotics spectrum can be narrowed.    Disposition: In the expected disposition is back home however I less physical therapy evaluated the patient and make recommendations.   LOS: 1 day

## 2012-01-21 NOTE — ED Provider Notes (Signed)
Medical screening examination/treatment/procedure(s) were performed by non-physician practitioner and as supervising physician I was immediately available for consultation/collaboration.  Odetta Forness R. Nayson Traweek, MD 01/21/12 0020 

## 2012-01-22 DIAGNOSIS — E782 Mixed hyperlipidemia: Secondary | ICD-10-CM

## 2012-01-22 DIAGNOSIS — R5381 Other malaise: Secondary | ICD-10-CM

## 2012-01-22 DIAGNOSIS — I951 Orthostatic hypotension: Secondary | ICD-10-CM

## 2012-01-22 DIAGNOSIS — R5383 Other fatigue: Secondary | ICD-10-CM

## 2012-01-22 DIAGNOSIS — R269 Unspecified abnormalities of gait and mobility: Secondary | ICD-10-CM

## 2012-01-22 LAB — DIFFERENTIAL
Eosinophils Absolute: 0.1 10*3/uL (ref 0.0–0.7)
Eosinophils Relative: 2 % (ref 0–5)
Lymphs Abs: 0.5 10*3/uL — ABNORMAL LOW (ref 0.7–4.0)
Monocytes Relative: 14 % — ABNORMAL HIGH (ref 3–12)

## 2012-01-22 LAB — CBC
HCT: 36.1 % — ABNORMAL LOW (ref 39.0–52.0)
Hemoglobin: 11.5 g/dL — ABNORMAL LOW (ref 13.0–17.0)
MCH: 29.1 pg (ref 26.0–34.0)
MCV: 91.4 fL (ref 78.0–100.0)
RBC: 3.95 MIL/uL — ABNORMAL LOW (ref 4.22–5.81)

## 2012-01-22 NOTE — Progress Notes (Signed)
Subjective: Patient states that he feels even better today. Patient states that he lives at home with his wife and is usually independent in the community level. He was supposed to start out patient PT today.  He has known orthostatic hypotension and has been prescribed TED hose by his PMD. Daughter brought ted hose in today and I showed her how to apply them.   Interval History: Urine culture shows E.Coli. Sensitivities pending.  Objective: Filed Vitals:   01/21/12 0851 01/21/12 1416 01/21/12 2118 01/22/12 0600  BP: 106/55 105/53 136/69 109/61  Pulse:  59 69 64  Temp:  98.1 F (36.7 C) 98.6 F (37 C) 98 F (36.7 C)  TempSrc:  Oral Oral Oral  Resp:  18 20 18   Height: 6' (1.829 m)     Weight:      SpO2:  94% 96% 96%   Weight change:   Intake/Output Summary (Last 24 hours) at 01/22/12 1143 Last data filed at 01/22/12 0931  Gross per 24 hour  Intake   2331 ml  Output   2625 ml  Net   -294 ml    General: Alert, awake, oriented x3, in no acute distress. Appears his stated age HEENT: [place]/AT PEERL, EOMI Neck: Trachea midline,  no masses, no thyromegal,y no JVD, no carotid bruit OROPHARYNX:  Moist, No exudate/ erythema/lesions.  Heart: Regular rate and rhythm, without murmurs, rubs, gallops, PMI non-displaced, no heaves or thrills on palpation.  Lungs: Clear to auscultation, no wheezing or rhonchi noted. No increased vocal fremitus resonant to percussion  Abdomen: Soft, nontender, nondistended, positive bowel sounds, no masses no hepatosplenomegaly noted..  Neuro: No focal neurological deficits noted cranial nerves II through XII grossly intact.  Musculoskeletal: No warm swelling or erythema around joints, no spinal tenderness noted.   Lab Results:  [facility] 01/21/12 2002 01/20/12 1650  NA 137 140  K 4.0 4.1  CL 105 104  CO2 25 24  GLUCOSE 140* 133*  BUN 21 22  CREATININE 1.23 1.06  CALCIUM 8.4 9.2  MG -- --  PHOS -- --   No results found for this basename:  AST:2,ALT:2,ALKPHOS:2,BILITOT:2,PROT:2,ALBUMIN:2 in the last 72 hours No results found for this basename: LIPASE:2,AMYLASE:2 in the last 72 hours  Basename 01/22/12 0533 01/21/12 0529 01/20/12 1650  WBC 7.1 14.3* --  NEUTROABS 5.5 -- 15.1*  HGB 11.5* 11.4* --  HCT 36.1* 35.5* --  MCV 91.4 91.0 --  PLT 124* 143* --   No results found for this basename: CKTOTAL:3,CKMB:3,CKMBINDEX:3,TROPONINI:3 in the last 72 hours No components found with this basename: POCBNP:3 No results found for this basename: DDIMER:2 in the last 72 hours No results found for this basename: HGBA1C:2 in the last 72 hours No results found for this basename: CHOL:2,HDL:2,LDLCALC:2,TRIG:2,CHOLHDL:2,LDLDIRECT:2 in the last 72 hours No results found for this basename: TSH,T4TOTAL,FREET3,T3FREE,THYROIDAB in the last 72 hours No results found for this basename: VITAMINB12:2,FOLATE:2,FERRITIN:2,TIBC:2,IRON:2,RETICCTPCT:2 in the last 72 hours  Micro Results: Recent Results (from the past 240 hour(s))  URINE CULTURE     Status: Normal (Preliminary result)   Collection Time   01/20/12  4:44 PM      Component Value Range Status Comment   Specimen Description URINE, CLEAN CATCH   Final    Special Requests Normal   Final    Culture  Setup Time [id number]   Final    Colony Count >=100,000 COLONIES/ML   Final    Culture ESCHERICHIA COLI   Final    Report Status PENDING   Incomplete  CULTURE, BLOOD (ROUTINE X 2)     Status: Normal (Preliminary result)   Collection Time   01/20/12  7:08 PM      Component Value Range Status Comment   Specimen Description BLOOD LEFT HAND  4 ML IN [facility] BOTTLE   Final    Special Requests Normal   Final    Culture  Setup Time [id number]   Final    Culture     Final    Value: GRAM POSITIVE COCCI IN CLUSTERS     Note: Gram Stain Report Called to,Read Back By and Verified With: Cindra Eves RN on 01/22/12 at 00:12 by Christie Nottingham   Report Status PENDING   Incomplete   CULTURE, BLOOD (ROUTINE X 2)      Status: Normal (Preliminary result)   Collection Time   01/20/12  7:17 PM      Component Value Range Status Comment   Specimen Description BLOOD RIGHT HAND  5 ML IN [facility] BOTTLE   Final    Special Requests Normal   Final    Culture  Setup Time [id number]   Final    Culture     Final    Value:        BLOOD CULTURE RECEIVED NO GROWTH TO DATE CULTURE WILL BE HELD FOR 5 DAYS BEFORE ISSUING A FINAL NEGATIVE REPORT   Report Status PENDING   Incomplete     Studies/Results: Dg Chest 2 View  01/20/2012  *RADIOLOGY REPORT*  Clinical Data: Weakness.  Status post fall  CHEST - 2 VIEW  Comparison: 10/24/2006  Findings: Heart size is normal.  No pleural effusion or edema.  No airspace consolidation identified.  Chronic left posterior and lateral rib fracture deformities are again noted and appear similar to previous exam.  IMPRESSION:  1.  No acute cardiopulmonary abnormalities.  Original Report Authenticated By: Rosealee Albee, M.D.    Medications: I have reviewed the patient's current medications. Scheduled Meds:    . aspirin EC  81 mg Oral Daily  . buPROPion  75 mg Oral BID  . cefTRIAXone (ROCEPHIN)  IV  1 g Intravenous Q24H  . clonazePAM  0.5 mg Oral BID  . doxazosin  1 mg Oral QHS  . heparin  5,000 Units Subcutaneous Q8H  . l-methylfolate-B6-B12  1 tablet Oral BID   Continuous Infusions:    . sodium chloride 75 mL/hr at 01/22/12 0920   PRN Meds:.acetaminophen, acetaminophen, alum & mag hydroxide-simeth, ondansetron (ZOFRAN) IV, ondansetron Assessment/Plan: Patient Active Hospital Problem List: Sepsis (01/20/2012)   Assessment: Resolved  UTI (lower urinary tract infection) (01/20/2012)   Assessment: E. Coli.  Continue current antibiotics.  HYPERTENSION (10/19/2006)   Assessment: The patient is actually borderline hypotensive. I spoke with patient's daughter who states that they have been having problems with orthostatic hypotension with the patient and he is actually supposed to wear  pressure gradient stockings. The family will bring the stockings in so the patient can rhythm while hospitalized.    Orthostatic Hypotension   Assessment: Continue to wear TED hose. No further intervention planned at this time. I will ask PT to attend to any orthostatic symptoms during PT.  Disposition: In the expected disposition is back home however I less physical therapy evaluated the patient and make recommendations.   LOS: 2 days

## 2012-01-22 NOTE — Significant Event (Signed)
CRITICAL VALUE ALERT  Critical value received: blood culture  Date of notification:  01/22/2012  Time of notification:  0035  Critical value read back:yes  Nurse who received alert:  Cindra Eves  MD notified (1st page): Elayne Guerin  Time of first page:  00: 35  MD notified (2nd page):  Time of second page:  Responding MD:  Elayne Guerin  Time MD responded:  02:11

## 2012-01-22 NOTE — Progress Notes (Signed)
Nutrition Consult Note  - Met with pt and wife who report pt eating well PTA, not on any nutritional supplements. Wife does the cooking at home. Pt reports he has been gaining weight - reports that 6 months ago he weighed 190 pounds, and that now he weighs 198 pounds, however weight in computer says 180 pounds, recommend re-weigh pt. Pt denies any difficulty chewing or swallowing. Pt on heart healthy diet with 90-100% meal intake. Pt's BMI is 26.8, overweight, per pt's report of current weight. Estimate nutritional needs to be 2050-2450 calories, 85-95g protein. No nutrition diagnosis at this time. Will monitor intake.   Wt Readings from Last 5 Encounters:  01/20/12 180 lb (81.647 kg)  12/21/11 198 lb 9.6 oz (90.084 kg)  05/11/11 190 lb 6.4 oz (86.365 kg)  01/20/11 189 lb (85.73 kg)  10/12/10 196 lb (88.905 kg)   Dietitian# [phone]

## 2012-01-22 NOTE — Progress Notes (Signed)
Critical blood culture results received of a gram positive cocci, notified the NP K. Greer Ee. No new order received.

## 2012-01-22 NOTE — Evaluation (Addendum)
Physical Therapy Evaluation Patient Details Name: [name] MRN: [id number] DOB: 06/12/1925 Today's Date: 01/22/2012 Time: 8250-5397 PT Time Calculation (min): 20 min  PT Assessment / Plan / Recommendation Clinical Impression  Pt presents with sepsis and history of basal cell cancer, anxiety, depression, and veritgo.  Presents today with decreased overall strength, mobility and balance.  Tolerated some ambulation in hallway, however states that his knees (L>R) are sore from laying in the bed.  Pts BP remained stable throughout session and was 154/76 at end of session. Pt will benefit from skilled PT in acute venue to address deficits.  PT recommends HHPT for follow up therapy at D/C in order to return to outpatient therapy when strong enough.     PT Assessment  Patient needs continued PT services    Follow Up Recommendations  Home health PT;Supervision for mobility/OOB    Barriers to Discharge None      lEquipment Recommendations  Rolling walker with 5" wheels    Recommendations for Other Services     Frequency Min 3X/week    Precautions / Restrictions Precautions Precautions: Fall Restrictions Weight Bearing Restrictions: No   Pertinent Vitals/Pain No pain initially, however some soreness in L knee with ambulation.       Mobility  Bed Mobility Bed Mobility: Supine to Sit;Sitting - Scoot to Edge of Bed Supine to Sit: 4: Min assist;3: Mod assist;HOB elevated Sitting - Scoot to [place] of Bed: 4: Min assist Details for Bed Mobility Assistance: Requires increased time with some assist for LLE off of bed with cues for technique.  Transfers Transfers: Sit to Stand;Stand to Sit Sit to Stand: 1: +2 Total assist;With upper extremity assist;From bed Sit to Stand: Patient Percentage: 50% Stand to Sit: 1: +2 Total assist;With upper extremity assist;With armrests;To chair/3-in-1 Stand to Sit: Patient Percentage: 70% Details for Transfer Assistance: Increased assist to rise due to  unsteadiness noted with posterior weight shift to heels when standing.  Cues for hand placement and safety.  Ambulation/Gait Ambulation/Gait Assistance: 1: +2 Total assist Ambulation/Gait: Patient Percentage: 70% Ambulation Distance (Feet): 70 Feet Assistive device: Rolling walker Ambulation/Gait Assistance Details: Cues for maintaining inside of RW when ambulating and for upright posture.   Gait Pattern: Step-through pattern;Decreased stride length;Trunk flexed;Antalgic Gait velocity: Antalgic like gait pattern due to increased soreness in LLE.  Wears brace to increase stability.  Stairs: No Wheelchair Mobility Wheelchair Mobility: No    Exercises     PT Diagnosis: Difficulty walking;Abnormality of gait;Generalized weakness;Acute pain  PT Problem List: Decreased strength;Decreased activity tolerance;Decreased balance;Decreased mobility;Pain PT Treatment Interventions: DME instruction;Gait training;Stair training;Functional mobility training;Therapeutic activities;Therapeutic exercise;Balance training;Patient/family education   PT Goals Acute Rehab PT Goals PT Goal Formulation: With patient/family Time For Goal Achievement: 02/05/12 Potential to Achieve Goals: Good Pt will go Sit to Stand: with supervision PT Goal: Sit to Stand - Progress: Goal set today Pt will go Stand to Sit: with supervision PT Goal: Stand to Sit - Progress: Goal set today Pt will Ambulate: 51 - 150 feet;with supervision;with least restrictive assistive device PT Goal: Ambulate - Progress: Goal set today Pt will Go Up / Down Stairs: 3-5 stairs;with min assist;with least restrictive assistive device PT Goal: Up/Down Stairs - Progress: Goal set today  Visit Information  Last PT Received On: 01/22/12 Assistance Needed: +2 (for safety )    Subjective Data  Subjective: I had no trouble with my health until about two years ago.  Patient Stated Goal: to return to outpatient therapy for balance and  vertigo.      Prior Functioning  Home Living Lives With: Spouse Available Help at Discharge: Family Type of Home: House Home Access: Stairs to enter Secretary/administrator of Steps: 4 Entrance Stairs-Rails: None Home Layout: One level Bathroom Shower/Tub: Tub/shower unit (has been doing sponge baths for a while) Firefighter: Standard Home Adaptive Equipment: Straight cane Prior Function Level of Independence: Independent with assistive device(s) Able to Take Stairs?: Yes Vocation: Retired Musician: No difficulties    Cognition  Overall Cognitive Status: Appears within functional limits for tasks assessed/performed Arousal/Alertness: Awake/alert Orientation Level: Appears intact for tasks assessed Behavior During Session: [facility] for tasks performed    Extremity/Trunk Assessment Right Lower Extremity Assessment RLE ROM/Strength/Tone: Deficits RLE ROM/Strength/Tone Deficits: at least 35 per functional tasks assessed RLE Sensation: History of peripheral neuropathy RLE Coordination: WFL - gross motor Left Lower Extremity Assessment LLE ROM/Strength/Tone: Deficits LLE ROM/Strength/Tone Deficits: at least 35 per functional tasks assessed.  LLE Sensation: History of peripheral neuropathy LLE Coordination: WFL - gross motor Trunk Assessment Trunk Assessment: Kyphotic   Balance    End of Session PT - End of Session Equipment Utilized During Treatment: Gait belt (brace on L knee) Activity Tolerance: Patient limited by pain;Patient limited by fatigue Patient left: in chair;with call bell/phone within reach;with family/visitor present Nurse Communication: Mobility status   Page, Meribeth Mattes 01/22/2012, 2:50 PM

## 2012-01-23 ENCOUNTER — Ambulatory Visit: Payer: Medicare Other | Admitting: Physical Therapy

## 2012-01-23 DIAGNOSIS — I951 Orthostatic hypotension: Secondary | ICD-10-CM

## 2012-01-23 DIAGNOSIS — R5383 Other fatigue: Secondary | ICD-10-CM

## 2012-01-23 DIAGNOSIS — E782 Mixed hyperlipidemia: Secondary | ICD-10-CM

## 2012-01-23 DIAGNOSIS — R5381 Other malaise: Secondary | ICD-10-CM

## 2012-01-23 DIAGNOSIS — R269 Unspecified abnormalities of gait and mobility: Secondary | ICD-10-CM

## 2012-01-23 LAB — URINE CULTURE: Colony Count: >=100000

## 2012-01-23 LAB — CULTURE, BLOOD (ROUTINE X 2)
Culture  Setup Time: 201306020301
Special Requests: NORMAL

## 2012-01-23 MED ORDER — CEPHALEXIN 500 MG PO CAPS: 500.0000 mg | ORAL_CAPSULE | Freq: Two times a day (BID) | ORAL | Status: AC

## 2012-01-23 MED ORDER — CEPHALEXIN 500 MG PO CAPS
500.0000 mg | ORAL_CAPSULE | Freq: Two times a day (BID) | ORAL | Status: DC
  Administered 2012-01-23: 500 mg via ORAL
  Filled 2012-01-23 (×2): qty 1

## 2012-01-23 NOTE — Progress Notes (Signed)
MEDICATION RELATED CONSULT NOTE - INITIAL   Pharmacy Consult for evaluation of poly-pharmacy  Allergies  Allergen Reactions  . Darifenacin Hydrobromide     REACTION: constipation  . Fluoxetine Hcl     REACTION: Very Depressed while taking   Patient Measurements: Height: 6' (182.9 cm) Weight: 180 lb (81.647 kg) IBW/kg (Calculated) : 77.6   Vital Signs: Temp: 97.9 F (36.6 C) (06/04 0526) Temp src: Oral (06/04 0526) BP: 100/64 mmHg (06/04 1235) Pulse Rate: 99  (06/04 1235)  Medical History: Past Medical History  Diagnosis Date  . Anemia     nos  . History of colonic polyps   . Hyperlipidemia   . Hypertension   . Cancer [phone]    hx of basal cell  . Total knee replacement status     right  . BPH (benign prostatic hyperplasia)   . Anxiety attack     panic   . Pre-syncope     echo 03/2010,with EF 60%,mild RV dilation,no significant abnormalities...event monitor for 3wks/episodes os sinus bradycardia with heart rate to low 40's occasionally at night(suspect while sleeping)    Medications:  Prescriptions prior to admission  Medication Sig Dispense Refill  . acetaminophen (RA ARTHRITIS PAIN RELIEF) 650 MG CR tablet Take 650 mg by mouth Nightly.        [name] aspirin 81 MG tablet Take 81 mg by mouth daily.        [name] buPROPion (WELLBUTRIN) 75 MG tablet Take 75 mg by mouth 2 (two) times daily.      . clonazePAM (KLONOPIN) 0.5 MG tablet Take 0.5 mg by mouth See admin instructions. Take 1 tablet in am and 2 tablets at bedtime      . doxazosin (CARDURA) 2 MG tablet TAKE 1/2 TABLET BY MOUTH EVERY DAY AT BEDTIME  90 tablet  1  . Ginkgo Biloba 120 MG CAPS Take 1 capsule by mouth every other day.       . multivitamin (METANX) 3-35-2 MG TABS tablet Take 1 tablet by mouth 2 (two) times daily.        Assessment:  Pharmacy was asked to evaluate the patient's home medication regimen, evaluating for issues of polypharmacy or Beer's criteria concerns.  The med list is quite short and no  therapeutic duplications are identified.   Benzodiazepines should always be used with caution in the elderly.  The doses of clonazepam are reasonable.  He takes them on a schedule. To be cautious, you could consider changing to a PRN regimen.  Benzos should never be discontinued abruptly d/t the risk of withdrawal symptoms.  Bupropion is generally recommended for anxiety and depression in the elderly.  It is less likely to cause anticholinergic side-effects than SSRIs.  The dose/regimen seems reasonable.  Doxazosin is a non-selective alpha-blocker and can cause significant orthostatic hypotension.  More uro-selective alpha-blockers are available, specifically tamsulosin is now available as a low-cost generic.  Ginkgo biloba, like most herbal supplements, has very little data on efficacy and safety so should be recommended with caution.  Consider changing aspirin to an enteric-coated product to reduce the risk of gastritis.  Thank you.  Charolotte Eke, PharmD, pager [phone]. 01/23/2012,1:15 PM.

## 2012-01-23 NOTE — Progress Notes (Signed)
Physical Therapy Treatment Patient Details Name: [name] MRN: [id number] DOB: 10-Oct-1924 Today's Date: 01/23/2012 Time: 0927-1005 PT Time Calculation (min): 38 min  PT Assessment / Plan / Recommendation Comments on Treatment Session  Pt is upset today because of his decrease in function and pain in legs.  He recognizes that he needs continued PT at SNF prior to D/C. Mentioned chnage in d/c plan to case manager and RN.  Pt does have orthostasis with BP ranging from 146/68 in lying to 99/59 in standing (documented in doc flow sheets). Pt does not have dizziness or increased weakness in standing. Pt will benefit from 5x/week PT at SNF prior to return to home    Follow Up Recommendations  Skilled nursing facility    Barriers to Discharge        Equipment Recommendations  Defer to next venue    Recommendations for Other Services OT consult  Frequency Min 3X/week   Plan Discharge plan needs to be updated    Precautions / Restrictions Precautions Precautions: Fall Precaution Comments: known othostatic hypotension even with compression hose Required Braces or Orthoses: Other Brace/Splint Other Brace/Splint: pt wearing knee brace on left knee for support that he has been wearing for a while Restrictions Weight Bearing Restrictions: No Other Position/Activity Restrictions: othostasis documented in doc flowsheets   Pertinent Vitals/Pain See doc flowsheets.  Pt with no C/o dizziness or increaed weakness in standing    Mobility  Bed Mobility Bed Mobility: Supine to Sit;Sitting - Scoot to Edge of Bed Supine to Sit: 4: Min guard;HOB elevated;With rails Sitting - Scoot to Edge of Bed: 4: Min assist Details for Bed Mobility Assistance: Requires increased time with some assist for LLE off of bed with cues for technique.  Transfers Transfers: Sit to Stand;Stand to Sit Sit to Stand: 4: Min guard;With upper extremity assist;From bed;From chair/3-in-1 Sit to Stand: Patient Percentage:  70% Stand to Sit: 4: Min assist;With upper extremity assist;To chair/3-in-1 Stand to Sit: Patient Percentage: 70% Details for Transfer Assistance: cues to push up with arms. pt reports difficulty from pain in knees and neuropathy in feet Ambulation/Gait Ambulation/Gait Assistance: 3: Mod assist Ambulation/Gait: Patient Percentage: 70% Ambulation Distance (Feet): 100 Feet Assistive device: Rolling walker Ambulation/Gait Assistance Details: cues to try to extend trunk and engage core groups.  Pt with thoracokyphosis evident Gait Pattern: Step-through pattern;Decreased stride length;Trunk flexed;Antalgic Gait velocity: Antalgic like gait pattern due to increased soreness in LLE.  Wears brace to increase stability.  General Gait Details: pt motivated and wants to walk. Stairs: No Wheelchair Mobility Wheelchair Mobility: No    Exercises General Exercises - Lower Extremity Ankle Circles/Pumps: AROM;Both;10 reps;Supine;Seated Quad Sets: Limitations Quad Sets Limitations: pt unable to get knees completely straight or produce effective quad set due to mulltiple complaints Gluteal Sets: AROM;5 reps;Standing;Seated Heel Slides: AAROM;Both;10 reps;Supine Hip ABduction/ADduction: AAROM;Both;10 reps;Supine   PT Diagnosis:    PT Problem List:   PT Treatment Interventions:     PT Goals Acute Rehab PT Goals PT Goal: Sit to Stand - Progress: Progressing toward goal PT Goal: Stand to Sit - Progress: Progressing toward goal PT Goal: Ambulate - Progress: Progressing toward goal  Visit Information  Last PT Received On: 01/23/12 Assistance Needed: +1    Subjective Data  Subjective: "I can't go home like this" "I need more rehab" Patient Stated Goal: to get stronger before he goes home and be independent as he was before   Cognition  Overall Cognitive Status: Appears within functional limits for tasks  assessed/performed Arousal/Alertness: Awake/alert Orientation Level: Appears intact for tasks  assessed Behavior During Session: [facility] for tasks performed Cognition - Other Comments: pt admits to feeling depressed today. He perseverates on negative aspects of his health and everything he can't do    Balance  Balance Balance Assessed: No  End of Session PT - End of Session Activity Tolerance: Patient limited by pain;Patient limited by fatigue Patient left: in chair;with call bell/phone within reach;with nursing in room Nurse Communication: Mobility status    Donnetta Hail 01/23/2012, 10:26 AM

## 2012-01-23 NOTE — Progress Notes (Signed)
Spoke with pt concerning discharge plans and needs.  Pt selected [facility] for Home Health RN/PT/NA/DME, referral was given to in house rep for [facility]. mp

## 2012-01-23 NOTE — Discharge Summary (Signed)
[name] MRN: [id number] DOB/AGE: 1925/01/19 78 y.o.  Admit date: 01/20/2012 Discharge date: 01/23/2012  Primary Care Physician:  Marga Melnick, MD, MD   Discharge Diagnoses:   Patient Active Problem List  Diagnoses  . HYPERLIPIDEMIA  . ANEMIA-NOS  . MENTAL CONFUSION  . DEPRESSION, RECURRENT  . INTENTION TREMOR  . HYPERTENSION  . Dizziness and giddiness  . INSOMNIA  . FATIGUE  . LACK OF COORDINATION  . NOCTURIA  . FASTING HYPERGLYCEMIA  . ABNORMAL ELECTROCARDIOGRAM  . SKIN CANCER, HX OF  . COLONIC POLYPS, HX OF  . CONSTIPATION  . Sepsis  . UTI (lower urinary tract infection)    DISCHARGE MEDICATION: Medication List  As of 01/23/2012 11:33 AM   TAKE these medications         aspirin 81 MG tablet   Take 81 mg by mouth daily.      buPROPion 75 MG tablet   Commonly known as: WELLBUTRIN   Take 75 mg by mouth 2 (two) times daily.      cephALEXin 500 MG capsule   Commonly known as: KEFLEX   Take 1 capsule (500 mg total) by mouth every 12 (twelve) hours.      clonazePAM 0.5 MG tablet   Commonly known as: KLONOPIN   Take 0.5 mg by mouth See admin instructions. Take 1 tablet in am and 2 tablets at bedtime      doxazosin 2 MG tablet   Commonly known as: CARDURA   TAKE 1/2 TABLET BY MOUTH EVERY DAY AT BEDTIME      Ginkgo Biloba 120 MG Caps   Take 1 capsule by mouth every other day.      multivitamin 3-35-2 MG Tabs tablet   Take 1 tablet by mouth 2 (two) times daily.      RA ARTHRITIS PAIN RELIEF 650 MG CR tablet   Generic drug: acetaminophen   Take 650 mg by mouth Nightly.              Consults:     SIGNIFICANT DIAGNOSTIC STUDIES:  Dg Chest 2 View  01/20/2012  *RADIOLOGY REPORT*  Clinical Data: Weakness.  Status post fall  CHEST - 2 VIEW  Comparison: 10/24/2006  Findings: Heart size is normal.  No pleural effusion or edema.  No airspace consolidation identified.  Chronic left posterior and lateral rib fracture deformities are again noted and appear  similar to previous exam.  IMPRESSION:  1.  No acute cardiopulmonary abnormalities.  Original Report Authenticated By: Rosealee Albee, M.D.        Recent Results (from the past 240 hour(s))  URINE CULTURE     Status: Normal   Collection Time   01/20/12  4:44 PM      Component Value Range Status Comment   Specimen Description URINE, CLEAN CATCH   Final    Special Requests Normal   Final    Culture  Setup Time [id number]   Final    Colony Count >=100,000 COLONIES/ML   Final    Culture ESCHERICHIA COLI   Final    Report Status 01/23/2012 FINAL   Final    Organism ID, Bacteria ESCHERICHIA COLI   Final   CULTURE, BLOOD (ROUTINE X 2)     Status: Normal   Collection Time   01/20/12  7:08 PM      Component Value Range Status Comment   Specimen Description BLOOD LEFT HAND  4 ML IN [facility] BOTTLE   Final    Special Requests Normal  Final    Culture  Setup Time [id number]   Final    Culture     Final    Value: STAPHYLOCOCCUS SPECIES (COAGULASE NEGATIVE)     Note: THE SIGNIFICANCE OF ISOLATING THIS ORGANISM FROM A SINGLE SET OF BLOOD CULTURES WHEN MULTIPLE SETS ARE DRAWN IS UNCERTAIN. PLEASE NOTIFY THE MICROBIOLOGY DEPARTMENT WITHIN ONE WEEK IF SPECIATION AND SENSITIVITIES ARE REQUIRED.     Note: Gram Stain Report Called to,Read Back By and Verified With: Cindra Eves RN on 01/22/12 at 00:12 by Christie Nottingham   Report Status 01/23/2012 FINAL   Final   CULTURE, BLOOD (ROUTINE X 2)     Status: Normal (Preliminary result)   Collection Time   01/20/12  7:17 PM      Component Value Range Status Comment   Specimen Description BLOOD RIGHT HAND  5 ML IN [facility] BOTTLE   Final    Special Requests Normal   Final    Culture  Setup Time [id number]   Final    Culture     Final    Value:        BLOOD CULTURE RECEIVED NO GROWTH TO DATE CULTURE WILL BE HELD FOR 5 DAYS BEFORE ISSUING A FINAL NEGATIVE REPORT   Report Status PENDING   Incomplete     BRIEF ADMITTING H & P: This is 78 y/o male who lives at home  with his wife, presented to the ED with complaints of weakness and fatigue for 2 days. On the day of admission, he started having fever and chills at home, and felt severly weak and almost fell. He denied any LOC.  I In ED, the patient had fever of 100.9, and [place]ocytosis [place] [address] blood cultures were sent, and had UA done which was positive, patient denied any dysuria or polyuria,    Hospital Course:  Present on Admission:  .UTI (lower urinary tract infection): Patient was admitted to the hospital with findings consistent with a urinary tract infection based on urinalysis. Urine culture was sent which revealed Escherichia coli as pathogen. The patient was started on Rocephin at the time of admission. Pathogen was sensitive to this medication. He was converted over to Keflex as an oral equivalent for treatment of the urinary tract infection. The patient will be on antibiotics for the next 10 days.   Marland KitchenGAIT ABNORMALITY: The patient has a gait abnormality which is most disturbing feature is disequilibrium. The patient seen by physical therapy who evaluated him and recommended that he receive home health physical therapy. Prior hospitalization the patient had been set up for outpatient therapy however it is the recommendation of physical therapy the patient first engaged home health therapy due to his deconditioning and disequilibrium at this time. He may be subsequently able to attend outpatient therapy to increase his strength and mobility however that should be deferred at this time. I discussed this with his primary care physician Dr. Alwyn Ren who is in agreement.   . Orthostatic hypotension: Patient has known orthostatic hypotension however is asymptomatic. But does have known drops in his blood pressure. His primary care physician Dr. Alwyn Ren is well aware of this and has recommended TED hose for him. The patient had not been using them. I recommend that he continues to use that on call for Dr. Alwyn Ren.  The patient is on Cardura for benign prostatic hypertrophy and uncertain that this is contributing to the orthostatic hypotension. He may need to consider changing this to a more you have selective medication  such as tamsulosin  . Coagulase negative staph in the blood: The patient had one out of 2 bottles positive for coagulase-negative staph. It is regarded as a contaminant rather than a pathogen. However the patient is on first generation cephalosporin which would cover this pathogen. And he is being treated for 14 days to 2 urinary tract infection presenting as pyelonephritis.  . Polypharmacy: At the request of the primary care physician and the patient's medications were reviewed by the clinical pharmacist. His recommendations are reviewed and he is in agreement with more your selective alpha-blocker and weaning benzodiazepines as they are recommended with caution in the elderly.  Disposition and Follow-up:   Patient to followup with Dr. Alwyn Ren in 3-5 days. Patient being discharged home with home health RN, PT, and a nurse aide.  Discharge Orders    Future Appointments: Provider: Department: Dept Phone: Center:   01/29/2012 1:45 PM Roselind Messier, PT [facility] [phone] [facility]   01/31/2012 1:45 PM Roselind Messier, PT [facility] [phone] [facility]   02/06/2012 1:45 PM Roselind Messier, PT [facility] [phone] [facility]   02/08/2012 3:15 PM Roselind Messier, PT [facility] [phone] [facility]   02/13/2012 2:30 PM Roselind Messier, PT [facility] [phone] [facility]   02/15/2012 2:30 PM Roselind Messier, PT [facility] [phone] [facility]     Future Orders Please Complete By Expires   Diet - low sodium heart healthy      Increase activity slowly         DISCHARGE EXAM:  General: Alert, awake, oriented x3, in no acute distress. Appears his stated age. Vital Signs:Blood pressure 137/75, pulse 78, temperature 97.9 F (36.6 C), temperature source Oral, resp. rate 16, height 6'  (1.829 m), weight 81.647 kg (180 lb), SpO2 94.00%. HEENT: [place]/AT PEERL, EOMI  Neck: Trachea midline, no masses, no thyromegal,y no JVD, no carotid bruit  OROPHARYNX: Moist, No exudate/ erythema/lesions.  Heart: Regular rate and rhythm, without murmurs, rubs, gallops, PMI non-displaced, no heaves or thrills on palpation.  Lungs: Clear to auscultation, no wheezing or rhonchi noted. No increased vocal fremitus resonant to percussion  Abdomen: Soft, nontender, nondistended, positive bowel sounds, no masses no hepatosplenomegaly noted..  Neuro: No focal neurological deficits noted cranial nerves II through XII grossly intact.  Musculoskeletal: No warm swelling or erythema around joints, no spinal tenderness noted. Condition at the time of discharge stable   Basename 01/21/12 2002 01/20/12 1650  NA 137 140  K 4.0 4.1  CL 105 104  CO2 25 24  GLUCOSE 140* 133*  BUN 21 22  CREATININE 1.23 1.06  CALCIUM 8.4 9.2  MG -- --  PHOS -- --   No results found for this basename: AST:2,ALT:2,ALKPHOS:2,BILITOT:2,PROT:2,ALBUMIN:2 in the last 72 hours No results found for this basename: LIPASE:2,AMYLASE:2 in the last 72 hours  Basename 01/22/12 0533 01/21/12 0529 01/20/12 1650  WBC 7.1 14.3* --  NEUTROABS 5.5 -- 15.1*  HGB 11.5* 11.4* --  HCT 36.1* 35.5* --  MCV 91.4 91.0 --  PLT 124* 143* --   Total discharge process including face-to-face time approximately 40 minutes  Signed: Amamda Curbow A. 01/23/2012, 11:33 AM

## 2012-01-25 ENCOUNTER — Telehealth: Payer: Self-pay | Admitting: *Deleted

## 2012-01-25 NOTE — Telephone Encounter (Signed)
Error

## 2012-01-26 ENCOUNTER — Ambulatory Visit: Payer: Medicare Other | Admitting: Physical Therapy

## 2012-01-27 LAB — CULTURE, BLOOD (ROUTINE X 2)

## 2012-01-29 ENCOUNTER — Ambulatory Visit: Payer: Medicare Other | Admitting: Physical Therapy

## 2012-01-31 ENCOUNTER — Ambulatory Visit: Payer: Medicare Other | Admitting: Physical Therapy

## 2012-02-05 ENCOUNTER — Other Ambulatory Visit: Payer: Self-pay | Admitting: Internal Medicine

## 2012-02-05 MED ORDER — CLONAZEPAM 0.5 MG PO TABS: 0.5000 mg | ORAL_TABLET | ORAL | Status: DC

## 2012-02-05 NOTE — Telephone Encounter (Signed)
OK X1 

## 2012-02-05 NOTE — Telephone Encounter (Signed)
Rx sent 

## 2012-02-05 NOTE — Telephone Encounter (Signed)
Refill clonazepam 0.5mg  tablet, take one tablet in the morning and two tablets at bedtime  Last wrt. 5.16.13 Last ov 5.2.13

## 2012-02-05 NOTE — Telephone Encounter (Signed)
Last OV 12-21-11,

## 2012-02-06 ENCOUNTER — Ambulatory Visit: Payer: Medicare Other | Admitting: Physical Therapy

## 2012-02-08 ENCOUNTER — Ambulatory Visit: Payer: Medicare Other | Admitting: Physical Therapy

## 2012-02-12 ENCOUNTER — Other Ambulatory Visit: Payer: Self-pay | Admitting: *Deleted

## 2012-02-12 MED ORDER — CLONAZEPAM 0.5 MG PO TABS: 0.5000 mg | ORAL_TABLET | ORAL | Status: DC

## 2012-02-12 NOTE — Telephone Encounter (Signed)
Rx sent 

## 2012-02-13 ENCOUNTER — Ambulatory Visit: Payer: Medicare Other | Admitting: Physical Therapy

## 2012-02-15 ENCOUNTER — Ambulatory Visit: Payer: Medicare Other | Admitting: Physical Therapy

## 2012-02-23 ENCOUNTER — Other Ambulatory Visit: Payer: Self-pay | Admitting: Internal Medicine

## 2012-03-03 DIAGNOSIS — N390 Urinary tract infection, site not specified: Secondary | ICD-10-CM

## 2012-03-03 DIAGNOSIS — I10 Essential (primary) hypertension: Secondary | ICD-10-CM

## 2012-03-03 DIAGNOSIS — R269 Unspecified abnormalities of gait and mobility: Secondary | ICD-10-CM

## 2012-03-03 DIAGNOSIS — I959 Hypotension, unspecified: Secondary | ICD-10-CM

## 2012-03-03 DIAGNOSIS — M6281 Muscle weakness (generalized): Secondary | ICD-10-CM

## 2012-03-11 ENCOUNTER — Other Ambulatory Visit: Payer: Self-pay | Admitting: Internal Medicine

## 2012-03-11 MED ORDER — CLONAZEPAM 0.5 MG PO TABS: 0.5000 mg | ORAL_TABLET | ORAL | Status: DC

## 2012-03-11 NOTE — Telephone Encounter (Signed)
RX called in .

## 2012-03-11 NOTE — Telephone Encounter (Signed)
ClonazePAM (Tab) KLONOPIN 0.5 MG Take 1 tablet (0.5 mg total) by mouth See admin instructions. Take 1 tablet in am and 2 tablets at bedtime, no qty listed, last fill 6.25.13  Last wrt 6.24.13 #90  Last ov 5.2.13

## 2012-04-08 ENCOUNTER — Other Ambulatory Visit: Payer: Self-pay | Admitting: Internal Medicine

## 2012-04-08 MED ORDER — CLONAZEPAM 0.5 MG PO TABS: 0.5000 mg | ORAL_TABLET | ORAL | Status: DC

## 2012-04-08 NOTE — Telephone Encounter (Signed)
RX called in .

## 2012-04-08 NOTE — Telephone Encounter (Signed)
CLONAZEPAM 0.5MG  TABLET LAST REFIL: 03/11/12 TAKE 1 TABLET BY MOUTH IN THE MORNING AND 2 TABLETS AT BEDTIME

## 2012-05-06 ENCOUNTER — Telehealth: Payer: Self-pay | Admitting: Internal Medicine

## 2012-05-06 MED ORDER — CLONAZEPAM 0.5 MG PO TABS: 0.5000 mg | ORAL_TABLET | ORAL | Status: DC

## 2012-05-06 NOTE — Telephone Encounter (Signed)
On 04/08/2012 # 90 given, please advise if ok to refill

## 2012-05-06 NOTE — Telephone Encounter (Signed)
OK # 90 

## 2012-05-06 NOTE — Telephone Encounter (Signed)
RX called in .

## 2012-05-06 NOTE — Telephone Encounter (Signed)
Refill: Clonazepam 0.5mg  tablet. Take 1 tablet by mouth in the morning and 2 tablets at bedtime. Last fill  04-08-12

## 2012-06-02 ENCOUNTER — Other Ambulatory Visit: Payer: Self-pay | Admitting: Internal Medicine

## 2012-06-11 ENCOUNTER — Other Ambulatory Visit: Payer: Self-pay | Admitting: Internal Medicine

## 2012-06-30 ENCOUNTER — Other Ambulatory Visit: Payer: Self-pay | Admitting: Internal Medicine

## 2012-07-28 ENCOUNTER — Other Ambulatory Visit: Payer: Self-pay | Admitting: Internal Medicine

## 2012-07-30 ENCOUNTER — Other Ambulatory Visit: Payer: Self-pay | Admitting: Internal Medicine

## 2012-08-23 ENCOUNTER — Other Ambulatory Visit: Payer: Self-pay | Admitting: Internal Medicine

## 2012-08-28 ENCOUNTER — Other Ambulatory Visit: Payer: Self-pay | Admitting: Internal Medicine

## 2012-08-28 NOTE — Telephone Encounter (Signed)
Left message on VM informing patient to stop by the office to sign Controlled Substance Contract and pick-up rx

## 2012-09-26 ENCOUNTER — Other Ambulatory Visit: Payer: Self-pay | Admitting: Internal Medicine

## 2012-09-27 NOTE — Telephone Encounter (Signed)
08/28/12 #90 given, please advise, based on instructions #90 is only a 30 day supply for patient, ? Ok to have additional refill

## 2012-09-27 NOTE — Telephone Encounter (Signed)
Refill #90;The medication is " as needed" and should not be taken on a regular basis.Regular use can increase risk of addiction but more importantly affect level of alertness and balance with increased risk of falling.   NR

## 2012-10-22 ENCOUNTER — Other Ambulatory Visit: Payer: Self-pay | Admitting: Internal Medicine

## 2012-11-19 ENCOUNTER — Other Ambulatory Visit: Payer: Self-pay | Admitting: Internal Medicine

## 2012-11-25 ENCOUNTER — Telehealth: Payer: Self-pay | Admitting: Internal Medicine

## 2012-11-25 ENCOUNTER — Encounter: Payer: Self-pay | Admitting: Internal Medicine

## 2012-11-25 ENCOUNTER — Ambulatory Visit (INDEPENDENT_AMBULATORY_CARE_PROVIDER_SITE_OTHER): Payer: Medicare Other | Admitting: Internal Medicine

## 2012-11-25 VITALS — BP 130/90 | HR 79 | Temp 96.9°F | Ht 72.0 in | Wt 208.2 lb

## 2012-11-25 DIAGNOSIS — K648 Other hemorrhoids: Secondary | ICD-10-CM

## 2012-11-25 DIAGNOSIS — K921 Melena: Secondary | ICD-10-CM

## 2012-11-25 MED ORDER — HYDROCORTISONE ACETATE 25 MG RE SUPP: 25.0000 mg | Freq: Two times a day (BID) | RECTAL | Status: DC

## 2012-11-25 NOTE — Telephone Encounter (Signed)
Pt wife called and stated that her husband has blood in his urine would like a nurse to give him a call back.

## 2012-11-25 NOTE — Telephone Encounter (Signed)
We have open appts today; please verify he has appt to check hematuria. Thanks

## 2012-11-25 NOTE — Patient Instructions (Signed)

## 2012-11-25 NOTE — Telephone Encounter (Signed)
Spoke with patient's wife, patient was asleep. Patient with blood in stool since yesterday with loose BM's, patient denies abdominal pain or discomfort. Patient prefers not to go to ER or Urgent Care, patient would like to be seen in office. No available appointment's at [facility], patient to be seen at [facility]

## 2012-11-25 NOTE — Telephone Encounter (Signed)
Returned pt's call.  Pt's wife states he already has an appt today and she has already spoken to someone in the office.

## 2012-11-25 NOTE — Telephone Encounter (Signed)
Pt has pending OV today @3 :45 with Nicki Reaper at [place]

## 2012-11-26 ENCOUNTER — Encounter: Payer: Self-pay | Admitting: Internal Medicine

## 2012-11-26 NOTE — Progress Notes (Signed)
Subjective:    Patient ID: [name], male    DOB: [birth year], 79 y.o.   MRN: [id number]  HPI  Pt presents to the clinic today with c/o rectal bleeding. This originally started 3 weeks ago and lasted for about 1 day. It went away until 2 days ago. He had 2 soft Bm's and noticed blood in his stool again. He does have issues with hemorrhoids. This morning when he woke up, he had 2 small BM's that had just a small amount of blood in them. He has not taken anything for the bleeding. He has had a colonoscopy in the past with Dr. Jarold Motto. He did have some benign polyps which were removed. He has not had any nausea, vomiting, diarrhea or weight loss. He has no family history of colon cancer. He has had blood in his stool before.  Review of Systems      Past Medical History  Diagnosis Date  . Anemia     nos  . History of colonic polyps   . Hyperlipidemia   . Hypertension   . Cancer [id number]    hx of basal cell  . Total knee replacement status     right  . BPH (benign prostatic hyperplasia)   . Anxiety attack     panic   . Pre-syncope     echo 03/2010,with EF 60%,mild RV dilation,no significant abnormalities...event monitor for 3wks/episodes os sinus bradycardia with heart rate to low 40's occasionally at night(suspect while sleeping)    Current Outpatient Prescriptions  Medication Sig Dispense Refill  . acetaminophen (RA ARTHRITIS PAIN RELIEF) 650 MG CR tablet Take 650 mg by mouth Nightly.        [name] aspirin 81 MG tablet Take 81 mg by mouth daily.        [name] buPROPion (WELLBUTRIN) 75 MG tablet TAKE 1 TABLET TWICE A DAY  60 tablet  4  . clonazePAM (KLONOPIN) 0.5 MG tablet TAKE 1 TAB IN THE MORNING & 2 TABS AT BEDTIME (THIS MED IS "AS NEEDED" & NOT TAKEN ON REGULAR BASIS)  90 tablet  0  . doxazosin (CARDURA) 2 MG tablet TAKE 1/2 TABLET BY MOUTH EVERY DAY AT BEDTIME  90 tablet  1  . Ginkgo Biloba 120 MG CAPS Take 1 capsule by mouth every other day.       . multivitamin (METANX) 3-35-2  MG TABS tablet Take 1 tablet by mouth 2 (two) times daily.      . hydrocortisone (ANUSOL-HC) 25 MG suppository Place 1 suppository (25 mg total) rectally 2 (two) times daily.  14 suppository  0   No current facility-administered medications for this visit.    Allergies  Allergen Reactions  . Darifenacin Hydrobromide     REACTION: constipation  . Fluoxetine Hcl     REACTION: Very Depressed while taking    Family History  Problem Relation Age of Onset  . Heart attack Father   . Heart failure Father 40    S/P Turp  . Heart disease Father 51    MI.Marland KitchenMarland KitchenCHF S/P TURP @ 70  . Stroke Brother 41  . Coronary artery disease Brother   . Lung cancer Brother     History   Social History  . Marital Status: Married    Spouse Name: N/A    Number of Children: N/A  . Years of Education: N/A   Occupational History  . Not on file.   Social History Main Topics  . Smoking status: Former Smoker  Quit date: 08/22/1983  . Smokeless tobacco: Not on file  . Alcohol Use: No  . Drug Use: No  . Sexually Active: Not on file   Other Topics Concern  . Not on file   Social History Narrative   MARRIED   REGULAR EXERCISE--YES WALKS 15 MINS 4-5X WK     Constitutional: Denies fever, malaise, fatigue, headache or abrupt weight changes.  Respiratory: Denies difficulty breathing, shortness of breath, cough or sputum production.   Cardiovascular: Denies chest pain, chest tightness, palpitations or swelling in the hands or feet.  Gastrointestinal: Pt reports blood in stool. Denies abdominal pain, bloating, constipation, diarrhea.  GU: Denies urgency, frequency, pain with urination, burning sensation, blood in urine, odor or discharge.   No other specific complaints in a complete review of systems (except as listed in HPI above).  Objective:   Physical Exam  BP 130/90  Pulse 79  Temp(Src) 96.9 F (36.1 C) (Oral)  Ht 6' (1.829 m)  Wt 208 lb 3.2 oz (94.439 kg)  BMI 28.23 kg/m2  SpO2 94% Wt  Readings from Last 3 Encounters:  11/25/12 208 lb 3.2 oz (94.439 kg)  01/20/12 180 lb (81.647 kg)  12/21/11 198 lb 9.6 oz (90.084 kg)    General: Appears her stated age, well developed, well nourished in NAD.  Cardiovascular: Normal rate and rhythm. S1,S2 noted.  No murmur, rubs or gallops noted. No JVD or BLE edema. No carotid bruits noted. Pulmonary/Chest: Normal effort and positive vesicular breath sounds. No respiratory distress. No wheezes, rales or ronchi noted.  Abdomen: Soft and nontender. Normal bowel sounds, no bruits noted. No distention or masses noted. Liver, spleen and kidneys non palpable. Rectal exam reveals external and internal hemorrhoids, small amount of blood in the rectal vault, no impaction noted.        Assessment & Plan:   Hematochezia, secondary to bleeding internal hemorrhoids, new onset:  Pt would like to wait before obtaining labs to see if this resolves Will give RX for anusol suppositories BID x 7 days If bleeding does not subside, will obtain Hand H and likely refer for colonoscopy Drink plenty of fluids and take a stool softener if needed  RTC in 1 week or soo ner if the bleeding increases

## 2012-11-28 ENCOUNTER — Ambulatory Visit (INDEPENDENT_AMBULATORY_CARE_PROVIDER_SITE_OTHER): Payer: Medicare Other | Admitting: Internal Medicine

## 2012-11-28 ENCOUNTER — Telehealth: Payer: Self-pay | Admitting: Internal Medicine

## 2012-11-28 ENCOUNTER — Encounter: Payer: Self-pay | Admitting: Internal Medicine

## 2012-11-28 VITALS — BP 104/72 | HR 108 | Temp 97.4°F | Ht 72.0 in | Wt 208.0 lb

## 2012-11-28 DIAGNOSIS — K5900 Constipation, unspecified: Secondary | ICD-10-CM

## 2012-11-28 NOTE — Patient Instructions (Signed)

## 2012-11-28 NOTE — Progress Notes (Signed)
Subjective:    Patient ID: [name], male    DOB: May 07, 1925, 79 y.o.   MRN: [id number]  HPI  Pt presents to the clinic today with c/o constipation. He was seen 3 days ago for having bloody BM's. He was started on Anusol suppositories. He reports that he has not had a BM since that time. He has not been taking any stool softeners. He has had problems with constipation in the past. He denies abdominal pain.  Review of Systems      Past Medical History  Diagnosis Date  . Anemia     nos  . History of colonic polyps   . Hyperlipidemia   . Hypertension   . Cancer [phone]    hx of basal cell  . Total knee replacement status     right  . BPH (benign prostatic hyperplasia)   . Anxiety attack     panic   . Pre-syncope     echo 03/2010,with EF 60%,mild RV dilation,no significant abnormalities...event monitor for 3wks/episodes os sinus bradycardia with heart rate to low 40's occasionally at night(suspect while sleeping)    Current Outpatient Prescriptions  Medication Sig Dispense Refill  . acetaminophen (RA ARTHRITIS PAIN RELIEF) 650 MG CR tablet Take 650 mg by mouth Nightly.        [name] aspirin 81 MG tablet Take 81 mg by mouth daily.        [name] buPROPion (WELLBUTRIN) 75 MG tablet TAKE 1 TABLET TWICE A DAY  60 tablet  4  . clonazePAM (KLONOPIN) 0.5 MG tablet TAKE 1 TAB IN THE MORNING & 2 TABS AT BEDTIME (THIS MED IS "AS NEEDED" & NOT TAKEN ON REGULAR BASIS)  90 tablet  0  . doxazosin (CARDURA) 2 MG tablet TAKE 1/2 TABLET BY MOUTH EVERY DAY AT BEDTIME  90 tablet  1  . Ginkgo Biloba 120 MG CAPS Take 1 capsule by mouth every other day.       . hydrocortisone (ANUSOL-HC) 25 MG suppository Place 1 suppository (25 mg total) rectally 2 (two) times daily.  14 suppository  0  . multivitamin (METANX) 3-35-2 MG TABS tablet Take 1 tablet by mouth 2 (two) times daily.       No current facility-administered medications for this visit.    Allergies  Allergen Reactions  . Darifenacin  Hydrobromide     REACTION: constipation  . Fluoxetine Hcl     REACTION: Very Depressed while taking    Family History  Problem Relation Age of Onset  . Heart attack Father   . Heart failure Father 80    S/P Turp  . Heart disease Father 35    MI.Marland KitchenMarland KitchenCHF S/P TURP @ 70  . Stroke Brother 40  . Coronary artery disease Brother   . Lung cancer Brother     History   Social History  . Marital Status: Married    Spouse Name: N/A    Number of Children: N/A  . Years of Education: N/A   Occupational History  . Not on file.   Social History Main Topics  . Smoking status: Former Smoker    Quit date: 08/22/1983  . Smokeless tobacco: Not on file  . Alcohol Use: No  . Drug Use: No  . Sexually Active: Not on file   Other Topics Concern  . Not on file   Social History Narrative   MARRIED   REGULAR EXERCISE--YES WALKS 15 MINS 4-5X WK     Constitutional: Denies fever, malaise, fatigue, headache  or abrupt weight changes.   Gastrointestinal: Pt reports constipation. Denies abdominal pain, bloating, constipation, diarrhea or blood in the stool.    No other specific complaints in a complete review of systems (except as listed in HPI above).  Objective:   Physical Exam   BP 104/72  Pulse 108  Temp(Src) 97.4 F (36.3 C) (Oral)  Ht 6' (1.829 m)  Wt 208 lb (94.348 kg)  BMI 28.2 kg/m2  SpO2 96% Wt Readings from Last 3 Encounters:  11/28/12 208 lb (94.348 kg)  11/25/12 208 lb 3.2 oz (94.439 kg)  01/20/12 180 lb (81.647 kg)    General: Appears his stated age, well developed, well nourished in NAD. Cardiovascular: Normal rate and rhythm. S1,S2 noted.  No murmur, rubs or gallops noted. No JVD or BLE edema. No carotid bruits noted. Pulmonary/Chest: Normal effort and positive vesicular breath sounds. No respiratory distress. No wheezes, rales or ronchi noted.  Abdomen: Soft and nontender. Normal bowel sounds, no bruits noted. No distention or masses noted. Liver, spleen and kidneys  non palpable.       Assessment & Plan:

## 2012-11-28 NOTE — Telephone Encounter (Signed)
Called pt, unable to get through, line busy.

## 2012-11-28 NOTE — Telephone Encounter (Signed)
Pt has appointment scheduled for today at 3:30pm.

## 2012-11-28 NOTE — Telephone Encounter (Signed)
Appt scheduled 1530 11/28/12 with [name]

## 2012-11-28 NOTE — Assessment & Plan Note (Signed)
Start Mirilax daily Drink with plenty of water Use with a stool softener like colace if needed. If diarrhea occurs, stop the colace

## 2012-11-28 NOTE — Telephone Encounter (Signed)
Pt req a phone call from the nurse or Baity. Pt stated that he having trouble with his bail movement. Please call pt.

## 2012-11-28 NOTE — Telephone Encounter (Signed)
Patient Information:  Caller Name: [name]  Phone: [phone]  Patient: William, Randolph  Gender: Male  DOB: 09/04/1924  Age: 79 Years  PCP: Nicki Reaper  Office Follow Up:  Does the office need to follow up with this patient?: No  Instructions For The Office: N/A  RN Note:  Patient calling about last BM 11/24/12; states is concerned about this.  Denies pain; advised appt today per constipation protocol.  Appt scheduled 1530 11/28/12 with Ms. Sampson Si. krs/can  Symptoms  Reason For Call & Symptoms: constipation; seen in office 11/25/12 and is being treated for hemorrhoids.  Reviewed Health History In EMR: Yes  Reviewed Medications In EMR: Yes  Reviewed Allergies In EMR: Yes  Reviewed Surgeries / Procedures: Yes  Date of Onset of Symptoms: Unknown  Guideline(s) Used:  Constipation  Disposition Per Guideline:   See Today in Office  Reason For Disposition Reached:   Last bowel movement (BM) > 4 days ago  Advice Given:  N/A  Patient Will Follow Care Advice:  YES  Appointment Scheduled:  11/28/2012 15:30:00 Appointment Scheduled Provider:  Nicki Reaper  Patient contacted [facility] office and was seen at [facility] office 11/25/12.  Appt scheduled in follow up with Ms. Sampson Si at [facility] office 11/28/12.   Per Epic, PCP is Dr. Alwyn Ren; note routed to office where seen as well as to PCP office/[place] [facility] Spice. krs/can

## 2012-12-06 ENCOUNTER — Inpatient Hospital Stay (HOSPITAL_COMMUNITY)
Admission: EM | Admit: 2012-12-06 | Discharge: 2012-12-20 | DRG: 286 | Disposition: A | Discharge status: Skilled Nursing Facility | Payer: Medicare Other | Attending: Cardiology | Admitting: Cardiology

## 2012-12-06 ENCOUNTER — Encounter (HOSPITAL_COMMUNITY): Payer: Self-pay

## 2012-12-06 ENCOUNTER — Emergency Department (HOSPITAL_COMMUNITY): Payer: Medicare Other

## 2012-12-06 DIAGNOSIS — N320 Bladder-neck obstruction: Secondary | ICD-10-CM | POA: Diagnosis not present

## 2012-12-06 DIAGNOSIS — N1330 Unspecified hydronephrosis: Secondary | ICD-10-CM | POA: Diagnosis present

## 2012-12-06 DIAGNOSIS — R609 Edema, unspecified: Secondary | ICD-10-CM

## 2012-12-06 DIAGNOSIS — D649 Anemia, unspecified: Secondary | ICD-10-CM | POA: Diagnosis present

## 2012-12-06 DIAGNOSIS — Z85828 Personal history of other malignant neoplasm of skin: Secondary | ICD-10-CM

## 2012-12-06 DIAGNOSIS — R0602 Shortness of breath: Secondary | ICD-10-CM

## 2012-12-06 DIAGNOSIS — I10 Essential (primary) hypertension: Secondary | ICD-10-CM | POA: Diagnosis present

## 2012-12-06 DIAGNOSIS — F411 Generalized anxiety disorder: Secondary | ICD-10-CM | POA: Diagnosis present

## 2012-12-06 DIAGNOSIS — K5900 Constipation, unspecified: Secondary | ICD-10-CM | POA: Diagnosis not present

## 2012-12-06 DIAGNOSIS — N400 Enlarged prostate without lower urinary tract symptoms: Secondary | ICD-10-CM | POA: Diagnosis present

## 2012-12-06 DIAGNOSIS — I2510 Atherosclerotic heart disease of native coronary artery without angina pectoris: Secondary | ICD-10-CM

## 2012-12-06 DIAGNOSIS — I509 Heart failure, unspecified: Secondary | ICD-10-CM | POA: Diagnosis present

## 2012-12-06 DIAGNOSIS — N170 Acute kidney failure with tubular necrosis: Secondary | ICD-10-CM | POA: Diagnosis not present

## 2012-12-06 DIAGNOSIS — F410 Panic disorder [episodic paroxysmal anxiety] without agoraphobia: Secondary | ICD-10-CM | POA: Diagnosis present

## 2012-12-06 DIAGNOSIS — Z87891 Personal history of nicotine dependence: Secondary | ICD-10-CM

## 2012-12-06 DIAGNOSIS — Z96659 Presence of unspecified artificial knee joint: Secondary | ICD-10-CM

## 2012-12-06 DIAGNOSIS — I4891 Unspecified atrial fibrillation: Secondary | ICD-10-CM

## 2012-12-06 DIAGNOSIS — I5021 Acute systolic (congestive) heart failure: Secondary | ICD-10-CM | POA: Diagnosis present

## 2012-12-06 DIAGNOSIS — F329 Major depressive disorder, single episode, unspecified: Secondary | ICD-10-CM | POA: Diagnosis present

## 2012-12-06 DIAGNOSIS — N179 Acute kidney failure, unspecified: Secondary | ICD-10-CM

## 2012-12-06 DIAGNOSIS — I5189 Other ill-defined heart diseases: Secondary | ICD-10-CM | POA: Diagnosis present

## 2012-12-06 DIAGNOSIS — I428 Other cardiomyopathies: Secondary | ICD-10-CM | POA: Diagnosis present

## 2012-12-06 DIAGNOSIS — E870 Hyperosmolality and hypernatremia: Secondary | ICD-10-CM | POA: Diagnosis not present

## 2012-12-06 DIAGNOSIS — R339 Retention of urine, unspecified: Secondary | ICD-10-CM | POA: Diagnosis not present

## 2012-12-06 DIAGNOSIS — F3289 Other specified depressive episodes: Secondary | ICD-10-CM | POA: Diagnosis present

## 2012-12-06 DIAGNOSIS — E785 Hyperlipidemia, unspecified: Secondary | ICD-10-CM | POA: Diagnosis present

## 2012-12-06 DIAGNOSIS — R5381 Other malaise: Secondary | ICD-10-CM | POA: Diagnosis present

## 2012-12-06 DIAGNOSIS — N390 Urinary tract infection, site not specified: Secondary | ICD-10-CM | POA: Diagnosis not present

## 2012-12-06 HISTORY — DX: Atherosclerotic heart disease of native coronary artery without angina pectoris: I25.10

## 2012-12-06 HISTORY — DX: Unspecified systolic (congestive) heart failure: I50.20

## 2012-12-06 HISTORY — DX: Unspecified atrial fibrillation: I48.91

## 2012-12-06 HISTORY — DX: Cyst of kidney, acquired: N28.1

## 2012-12-06 HISTORY — DX: Intracardiac thrombosis, not elsewhere classified: I51.3

## 2012-12-06 HISTORY — DX: Acute kidney failure, unspecified: N17.9

## 2012-12-06 HISTORY — DX: Retention of urine, unspecified: R33.9

## 2012-12-06 LAB — URINALYSIS, ROUTINE W REFLEX MICROSCOPIC
Ketones, ur: NEGATIVE mg/dL
Nitrite: NEGATIVE
Urobilinogen, UA: 0.2 mg/dL (ref 0.0–1.0)
pH: 5 (ref 5.0–8.0)

## 2012-12-06 LAB — COMPREHENSIVE METABOLIC PANEL
AST: 82 U/L — ABNORMAL HIGH (ref 0–37)
Albumin: 3.5 g/dL (ref 3.5–5.2)
Alkaline Phosphatase: 85 U/L (ref 39–117)
Chloride: 106 mEq/L (ref 96–112)
Creatinine, Ser: 1.38 mg/dL — ABNORMAL HIGH (ref 0.50–1.35)
Potassium: 4.8 mEq/L (ref 3.5–5.1)
Total Bilirubin: 0.8 mg/dL (ref 0.3–1.2)

## 2012-12-06 LAB — URINE MICROSCOPIC-ADD ON

## 2012-12-06 LAB — CBC WITH DIFFERENTIAL/PLATELET
Basophils Absolute: 0 10*3/uL (ref 0.0–0.1)
Basophils Relative: 0 % (ref 0–1)
MCHC: 31.7 g/dL (ref 30.0–36.0)
Neutro Abs: 11.7 10*3/uL — ABNORMAL HIGH (ref 1.7–7.7)
Neutrophils Relative %: 84 % — ABNORMAL HIGH (ref 43–77)
RDW: 16.8 % — ABNORMAL HIGH (ref 11.5–15.5)

## 2012-12-06 LAB — TSH: TSH: 3 u[IU]/mL (ref 0.350–4.500)

## 2012-12-06 LAB — PRO B NATRIURETIC PEPTIDE: Pro B Natriuretic peptide (BNP): 10333 pg/mL — ABNORMAL HIGH (ref 0–450)

## 2012-12-06 MED ORDER — DILTIAZEM HCL 100 MG IV SOLR
5.0000 mg/h | Freq: Once | INTRAVENOUS | Status: AC
  Administered 2012-12-06: 15 mg/h via INTRAVENOUS

## 2012-12-06 MED ORDER — ASPIRIN 81 MG PO CHEW
324.0000 mg | CHEWABLE_TABLET | Freq: Once | ORAL | Status: AC
  Administered 2012-12-06: 324 mg via ORAL
  Filled 2012-12-06: qty 4

## 2012-12-06 MED ORDER — SODIUM CHLORIDE 0.9 % IV BOLUS (SEPSIS)
500.0000 mL | Freq: Once | INTRAVENOUS | Status: AC
  Administered 2012-12-06: 500 mL via INTRAVENOUS

## 2012-12-06 MED ORDER — FUROSEMIDE 10 MG/ML IJ SOLN
40.0000 mg | Freq: Once | INTRAMUSCULAR | Status: AC
  Administered 2012-12-06: 40 mg via INTRAVENOUS
  Filled 2012-12-06: qty 4

## 2012-12-06 MED ORDER — DILTIAZEM LOAD VIA INFUSION
10.0000 mg | Freq: Once | INTRAVENOUS | Status: AC
  Administered 2012-12-06: 10 mg via INTRAVENOUS

## 2012-12-06 MED ORDER — DILTIAZEM HCL 100 MG IV SOLR
5.0000 mg/h | Freq: Once | INTRAVENOUS | Status: AC
  Administered 2012-12-06: 5 mg/h via INTRAVENOUS

## 2012-12-06 MED ORDER — DILTIAZEM HCL 100 MG IV SOLR
10.0000 mg/h | Freq: Once | INTRAVENOUS | Status: AC
  Administered 2012-12-08: 10 mg/h via INTRAVENOUS
  Filled 2012-12-06: qty 100

## 2012-12-06 NOTE — ED Notes (Signed)
Advised the patient we need a urine sample. 

## 2012-12-06 NOTE — ED Provider Notes (Signed)
History     CSN: [id number]  Arrival date & time 12/06/12  1625   First MD Initiated Contact with Patient 12/06/12 1650      Chief Complaint  Patient presents with  . Shortness of Breath    (Consider location/radiation/quality/duration/timing/severity/associated sxs/prior treatment) HPI Comments: 79 y M with PMH anemia, HLD, HTN and anxiety here after his wife called EMS b/c she noticed he was breathing fast.  Per the wife his symptoms started this morning.  The patient reports he has not felt well for several weeks, but does not do a great job at articulating his complaints.  HR reportedly in 200s with EMS and he was noted to be in afib so he was given 10mg  x2 of diltiazem with improvement into 140s.  He received 10 mg additional dilt on arrival and was placed on a gtt.   Patient is a 79 y.o. male presenting with shortness of breath. The history is provided by the patient, the spouse and the EMS personnel.  Shortness of Breath Severity:  Moderate Onset quality:  Gradual Duration: today. Timing:  Constant Progression:  Worsening Chronicity:  New Context: activity   Relieved by:  Rest Worsened by:  Activity and exertion Associated symptoms: no chest pain and no cough     Past Medical History  Diagnosis Date  . Anemia     nos  . History of colonic polyps   . Hyperlipidemia   . Hypertension   . Cancer [phone]    hx of basal cell  . Total knee replacement status     right  . BPH (benign prostatic hyperplasia)   . Anxiety attack     panic   . Pre-syncope     echo 03/2010,with EF 60%,mild RV dilation,no significant abnormalities...event monitor for 3wks/episodes os sinus bradycardia with heart rate to low 40's occasionally at night(suspect while sleeping)    Past Surgical History  Procedure Laterality Date  . Colonoscopy w/ polypectomy  2007  . Cholecystectomy    . Total knee arthroplasty    . Mohs surgery  2010    Family History  Problem Relation Age of Onset  .  Heart attack Father   . Heart failure Father 76    S/P Turp  . Heart disease Father 77    MI.Marland KitchenMarland KitchenCHF S/P TURP @ 70  . Stroke Brother 41  . Coronary artery disease Brother   . Lung cancer Brother     History  Substance Use Topics  . Smoking status: Former Smoker    Quit date: 08/22/1983  . Smokeless tobacco: Not on file  . Alcohol Use: No      Review of Systems  Respiratory: Positive for shortness of breath. Negative for cough.   Cardiovascular: Negative for chest pain.  All other systems reviewed and are negative.    Allergies  Darifenacin hydrobromide and Fluoxetine hcl  Home Medications   Current Outpatient Rx  Name  Route  Sig  Dispense  Refill  . acetaminophen (RA ARTHRITIS PAIN RELIEF) 650 MG CR tablet   Oral   Take 650 mg by mouth Nightly.           [name] aspirin 81 MG tablet   Oral   Take 81 mg by mouth daily.           [name] buPROPion (WELLBUTRIN) 75 MG tablet      TAKE 1 TABLET TWICE A DAY   60 tablet   4   . clonazePAM (KLONOPIN) 0.5 MG tablet  TAKE 1 TAB IN THE MORNING & 2 TABS AT BEDTIME (THIS MED IS "AS NEEDED" & NOT TAKEN ON REGULAR BASIS)   90 tablet   0   . doxazosin (CARDURA) 2 MG tablet      TAKE 1/2 TABLET BY MOUTH EVERY DAY AT BEDTIME   90 tablet   1   . Ginkgo Biloba 120 MG CAPS   Oral   Take 1 capsule by mouth every other day.          . hydrocortisone (ANUSOL-HC) 25 MG suppository   Rectal   Place 1 suppository (25 mg total) rectally 2 (two) times daily.   14 suppository   0   . multivitamin (METANX) 3-35-2 MG TABS tablet   Oral   Take 1 tablet by mouth 2 (two) times daily.           BP 110/73  Pulse 133  Temp(Src) 97.2 F (36.2 C) (Oral)  Resp 14  SpO2 91%  Physical Exam  Vitals reviewed. Constitutional: He is oriented to person, place, and time. He appears well-developed and well-nourished. No distress.  HENT:  Head: Normocephalic.  Right Ear: External ear normal.  Left Ear: External ear normal.   Nose: Nose normal.  Mouth/Throat: Oropharynx is clear and moist. No oropharyngeal exudate.  Eyes: Conjunctivae and EOM are normal. Pupils are equal, round, and reactive to light.  Neck: Normal range of motion. Neck supple.  Cardiovascular: Normal heart sounds and intact distal pulses.  Exam reveals no gallop and no friction rub.   No murmur heard. Irregular rhythm, tachycardic  Pulmonary/Chest: He has rales (bases).  mildy increased effort, BS diminished bilaterally  Abdominal: Soft. Bowel sounds are normal. He exhibits no distension. There is no tenderness.  Musculoskeletal: Normal range of motion. He exhibits edema (2+ to knees). He exhibits no tenderness.  Neurological: He is alert and oriented to person, place, and time. No cranial nerve deficit.  Skin: Skin is warm and dry.  Psychiatric: He has a normal mood and affect.    ED Course  Procedures (including critical care time)  Labs Reviewed  CBC WITH DIFFERENTIAL - Abnormal; Notable for the following:    WBC 14.0 (*)    RBC 3.88 (*)    Hemoglobin 10.9 (*)    HCT 34.4 (*)    RDW 16.8 (*)    Neutrophils Relative 84 (*)    Neutro Abs 11.7 (*)    Lymphocytes Relative 7 (*)    Monocytes Absolute 1.2 (*)    All other components within normal limits  COMPREHENSIVE METABOLIC PANEL - Abnormal; Notable for the following:    Glucose, Bld 125 (*)    BUN 41 (*)    Creatinine, Ser 1.38 (*)    AST 82 (*)    ALT 89 (*)    GFR calc non Af Amer 45 (*)    GFR calc Af Amer 52 (*)    All other components within normal limits  PRO B NATRIURETIC PEPTIDE - Abnormal; Notable for the following:    Pro B Natriuretic peptide (BNP) 10333.0 (*)    All other components within normal limits  MAGNESIUM  TSH  URINALYSIS, ROUTINE W REFLEX MICROSCOPIC  POCT I-STAT TROPONIN I   Dg Chest Portable 1 View  12/06/2012  *RADIOLOGY REPORT*  Clinical Data: Shortness of breath.  PORTABLE CHEST - 1 VIEW  Comparison: 01/20/2012.  Findings: The heart is  enlarged.  There is tortuosity and calcification of the thoracic aorta.  There is  vascular congestion, asymmetric pulmonary edema and bilateral pleural effusions consistent with CHF.  IMPRESSION: Congestive heart failure.   Original Report Authenticated By: Rudie Meyer, M.D.     Date: 12/07/2012  Rate: 143  Rhythm: atrial fibrillation  QRS Axis: normal  Intervals: QT prolonged  ST/T Wave abnormalities: normal  Conduction Disutrbances:none  Narrative Interpretation: A fib with RVR  Old EKG Reviewed: no significant change noted     1. Heart failure of unknown type   2. SOB (shortness of breath)   3. Edema   4. Atrial fibrillation with RVR       MDM   50 y M with PMH anemia, HLD, HTN and anxiety here after his wife called EMS b/c she noticed he was breathing fast.  Per the wife his symptoms started this morning.  The patient reports he has not felt well for several weeks, but does not do a great job at articulating his complaints.  HR reportedly in 200s with EMS and he was noted to be in afib so he was given 10mg  x2 of diltiazem with improvement into 140s.  He received 10 mg additional dilt on arrival and was placed on a gtt.  On exam, AF, BPs adequate, mentating well.  Lungs decreased bilaterally with slight rales in bases.  Abd soft.  2+ pitting edema to knees bilaterally.  Diff Dx: CHF, electrolyte abnormality, ARF, ACS, PNA.  CBC, BMP, TSH, CXR, bnp, trop, EKG.  7:57 PM bnp 10333.  No priors for comparison.  AST and ALT slightly elevated.  40 mg Lasix.  Cardiology consulted for admission for new onset CHF and afib with RVR.  1:23 AM Pt still pending admission to the hospital.  HR in 100s. BPs adequate.  Disposition: Admit  Condition: Stable  Pt seen in conjunction with my attending, Dr. Preston Fleeting.  Oleh Genin, MD PGY-II [facility] Emergency Medicine Resident  Oleh Genin, MD 12/07/12 [phone]

## 2012-12-06 NOTE — ED Provider Notes (Signed)
79 year old male reports dyspnea and weakness and fatigue over the last several days.  He tried to get in to see his physician but the office was closed, so called EMS who noticed he was in an atrial fibrillation with rapid ventricular response. He was given diltiazem which reduced her rate but was still tachycardic. He denies chest pain, heaviness, tightness, pressure. He denied nausea, vomiting, diaphoresis. On exam, lungs are clear. Heart is tachycardic and irregular. In the ED, he is given additional diltiazem and placed on a diltiazem drip with good control of heart rate.   Date: 12/06/2012  Rate: 143  Rhythm: atrial fibrillation  QRS Axis: normal  Intervals: normal  ST/T Wave abnormalities: normal  Conduction Disutrbances:none  Narrative Interpretation: Atrial fibrillation with rapid ventricular response, low voltage, possible old inferior wall myocardial infarction, old anteroseptal myocardial infarction. When compared with ECG of 01/20/2012, no significant changes are seen to  Old EKG Reviewed: unchanged  CRITICAL CARE Performed by: ZOXWR,UEAVW   Total critical care time: 40 minutes  Critical care time was exclusive of separately billable procedures and treating other patients.  Critical care was necessary to treat or prevent imminent or life-threatening deterioration.  Critical care was time spent personally by me on the following activities: development of treatment plan with patient and/or surrogate as well as nursing, discussions with consultants, evaluation of patient's response to treatment, examination of patient, obtaining history from patient or surrogate, ordering and performing treatments and interventions, ordering and review of laboratory studies, ordering and review of radiographic studies, pulse oximetry and re-evaluation of patient's condition.   I saw and evaluated the patient, reviewed the resident's note and I agree with the findings and plan.   Dione Booze,  MD 12/06/12 2153

## 2012-12-06 NOTE — H&P (Signed)
Cardiology History and Physical  Marga Melnick, MD  History of Present Illness (and review of medical records): [name] is a 79 y.o. male who presents for evaluation of shortness of breath.  He has chronic cardiac issues and was last evaluated by [facility] for presyncope with no arrhythmias on Holter monitor and normal echo in 2011. He reports shortness of breath at rest along with generalized malaise for past 2 weeks.  Wife felt he had significant worsening today with labored breathing.  He denies any associated chest pain, palpitations, presyncope or syncope. She called EMS.  He was found to be in AFib with RVR.  He was given Dilt IV then started on gtt.  Cardiology was called for admission.   Previous diagnostic testing for coronary artery disease includes: echocardiogram. Previous history of cardiac disease includes Presyncope. Coronary artery disease risk factors include: advanced age (older than 40 for men, 17 for women), male gender, sedentary lifestyle and smoking/ tobacco exposure. Patient denies history of angina, cardiomyopathy, ischemic heart disease, previous M.I. and valvular disease.  Review of Systems No fevers, chills, night sweats.  He is not very functional.  Mainly leaves house for only doctors appts.  Further review of systems was otherwise negative other than stated in HPI.  Patient Active Problem List   Diagnosis Date Noted  . Sepsis 01/20/2012  . UTI (lower urinary tract infection) 01/20/2012  . CONSTIPATION 10/12/2010  . DEPRESSION, RECURRENT 07/04/2010  . INTENTION TREMOR 07/04/2010  . INSOMNIA 06/27/2010  . NOCTURIA 06/27/2010  . ABNORMAL ELECTROCARDIOGRAM 04/19/2010  . MENTAL CONFUSION 03/08/2010  . Dizziness and giddiness 03/08/2010  . LACK OF COORDINATION 03/08/2010  . FATIGUE 02/03/2009  . FASTING HYPERGLYCEMIA 02/03/2009  . SKIN CANCER, HX OF 02/03/2009  . HYPERLIPIDEMIA 10/19/2006  . ANEMIA-NOS 10/19/2006  . HYPERTENSION 10/19/2006  .  COLONIC POLYPS, HX OF 10/19/2006   Past Medical History  Diagnosis Date  . Anemia     nos  . History of colonic polyps   . Hyperlipidemia   . Hypertension   . Cancer [phone]    hx of basal cell  . Total knee replacement status     right  . BPH (benign prostatic hyperplasia)   . Anxiety attack     panic   . Pre-syncope     echo 03/2010,with EF 60%,mild RV dilation,no significant abnormalities...event monitor for 3wks/episodes os sinus bradycardia with heart rate to low 40's occasionally at night(suspect while sleeping)    Past Surgical History  Procedure Laterality Date  . Colonoscopy w/ polypectomy  2007  . Cholecystectomy    . Total knee arthroplasty    . Mohs surgery  2010    Prescriptions prior to admission  Medication Sig Dispense Refill  . acetaminophen (RA ARTHRITIS PAIN RELIEF) 650 MG CR tablet Take 650 mg by mouth Nightly.        [name] aspirin 81 MG tablet Take 81 mg by mouth daily.        [name] buPROPion (WELLBUTRIN) 75 MG tablet TAKE 1 TABLET TWICE A DAY  60 tablet  4  . clonazePAM (KLONOPIN) 0.5 MG tablet TAKE 1 TAB IN THE MORNING & 2 TABS AT BEDTIME (THIS MED IS "AS NEEDED" & NOT TAKEN ON REGULAR BASIS)  90 tablet  0  . doxazosin (CARDURA) 2 MG tablet TAKE 1/2 TABLET BY MOUTH EVERY DAY AT BEDTIME  90 tablet  1  . Ginkgo Biloba 120 MG CAPS Take 1 capsule by mouth every other day.       [name]  hydrocortisone (ANUSOL-HC) 25 MG suppository Place 1 suppository (25 mg total) rectally 2 (two) times daily.  14 suppository  0  . multivitamin (METANX) 3-35-2 MG TABS tablet Take 1 tablet by mouth 2 (two) times daily.       Allergies  Allergen Reactions  . Darifenacin Hydrobromide     REACTION: constipation  . Fluoxetine Hcl     REACTION: Very Depressed while taking    History  Substance Use Topics  . Smoking status: Former Smoker    Quit date: 08/22/1983  . Smokeless tobacco: Not on file  . Alcohol Use: No    Family History  Problem Relation Age of Onset  . Heart attack  Father   . Heart failure Father 29    S/P Turp  . Heart disease Father 24    MI.Marland KitchenMarland KitchenCHF S/P TURP @ 70  . Stroke Brother 80  . Coronary artery disease Brother   . Lung cancer Brother      Objective: Patient Vitals for the past 8 hrs:  BP Temp Temp src Pulse Resp SpO2 Height Weight  12/07/12 0222 101/75 mmHg 97.1 F (36.2 C) Oral 120 21 93 % 6' (1.829 m) 91.717 kg (202 lb 3.2 oz)  12/07/12 0145 100/75 mmHg - - 74 22 93 % - -  12/07/12 0015 102/65 mmHg - - 111 19 94 % - -  12/06/12 2345 103/75 mmHg - - 112 - 91 % - -  12/06/12 2300 114/80 mmHg - - 89 - 92 % - -  12/06/12 2200 103/71 mmHg - - 114 27 94 % - -  12/06/12 2130 100/72 mmHg - - 118 21 95 % - -  12/06/12 2115 127/76 mmHg - - 102 34 94 % - -  12/06/12 2100 119/93 mmHg - - - 29 - - -  12/06/12 2045 110/83 mmHg - - 122 23 94 % - -  12/06/12 2030 125/82 mmHg - - 97 28 95 % - -   General Appearance:    Alert, cooperative, no distress, elderly appearing male  Head:    Normocephalic, without obvious abnormality, atraumatic  Eyes:     Anicteric sclerae  Neck:   Supple, JVP to ear lobe  Lungs:     Clear to auscultation bilaterally, respirations mildly labored  Heart:    irregular rate and rhythm, no murmurs detected  Abdomen:     Soft, non-tender, distended, normoactive bowel sounds  Extremities:   Extremities normal, atraumatic, Bilateral pitting edema  Pulses:   2+ and symmetric all extremities  Skin:   Dry skin, scaly  Neurologic:   No focal deficits. AAO x3   Results for orders placed during the hospital encounter of 12/06/12 (from the past 48 hour(s))  CBC WITH DIFFERENTIAL     Status: Abnormal   Collection Time    12/06/12  5:10 PM      Result Value Range   WBC 14.0 (*) 4.0 - 10.5 K/uL   RBC 3.88 (*) 4.22 - 5.81 MIL/uL   Hemoglobin 10.9 (*) 13.0 - 17.0 g/dL   HCT 40.9 (*) 81.1 - 91.4 %   MCV 88.7  78.0 - 100.0 fL   MCH 28.1  26.0 - 34.0 pg   MCHC 31.7  30.0 - 36.0 g/dL   RDW 78.2 (*) 95.6 - 21.3 %   Platelets 204   150 - 400 K/uL   Neutrophils Relative 84 (*) 43 - 77 %   Neutro Abs 11.7 (*) 1.7 - 7.7 K/uL  Lymphocytes Relative 7 (*) 12 - 46 %   Lymphs Abs 1.0  0.7 - 4.0 K/uL   Monocytes Relative 9  3 - 12 %   Monocytes Absolute 1.2 (*) 0.1 - 1.0 K/uL   Eosinophils Relative 0  0 - 5 %   Eosinophils Absolute 0.1  0.0 - 0.7 K/uL   Basophils Relative 0  0 - 1 %   Basophils Absolute 0.0  0.0 - 0.1 K/uL  COMPREHENSIVE METABOLIC PANEL     Status: Abnormal   Collection Time    12/06/12  5:10 PM      Result Value Range   Sodium 141  135 - 145 mEq/L   Potassium 4.8  3.5 - 5.1 mEq/L   Chloride 106  96 - 112 mEq/L   CO2 24  19 - 32 mEq/L   Glucose, Bld 125 (*) 70 - 99 mg/dL   BUN 41 (*) 6 - 23 mg/dL   Creatinine, Ser 1.61 (*) 0.50 - 1.35 mg/dL   Calcium 9.4  8.4 - 09.6 mg/dL   Total Protein 7.1  6.0 - 8.3 g/dL   Albumin 3.5  3.5 - 5.2 g/dL   AST 82 (*) 0 - 37 U/L   ALT 89 (*) 0 - 53 U/L   Alkaline Phosphatase 85  39 - 117 U/L   Total Bilirubin 0.8  0.3 - 1.2 mg/dL   GFR calc non Af Amer 45 (*) >90 mL/min   GFR calc Af Amer 52 (*) >90 mL/min   Comment:            The eGFR has been calculated     using the CKD EPI equation.     This calculation has not been     validated in all clinical     situations.     eGFR's persistently     <90 mL/min signify     possible Chronic Kidney Disease.  MAGNESIUM     Status: None   Collection Time    12/06/12  5:10 PM      Result Value Range   Magnesium 2.5  1.5 - 2.5 mg/dL  TSH     Status: None   Collection Time    12/06/12  5:10 PM      Result Value Range   TSH 3.000  0.350 - 4.500 uIU/mL  PRO B NATRIURETIC PEPTIDE     Status: Abnormal   Collection Time    12/06/12  5:10 PM      Result Value Range   Pro B Natriuretic peptide (BNP) 10333.0 (*) 0 - 450 pg/mL  POCT I-STAT TROPONIN I     Status: None   Collection Time    12/06/12  5:24 PM      Result Value Range   Troponin i, poc 0.03  0.00 - 0.08 ng/mL   Comment 3            Comment: Due to the  release kinetics of cTnI,     a negative result within the first hours     of the onset of symptoms does not rule out     myocardial infarction with certainty.     If myocardial infarction is still suspected,     repeat the test at appropriate intervals.  URINALYSIS, ROUTINE W REFLEX MICROSCOPIC     Status: Abnormal   Collection Time    12/06/12  9:09 PM      Result Value Range   Color, Urine YELLOW  YELLOW  APPearance TURBID (*) CLEAR   Specific Gravity, Urine 1.018  1.005 - 1.030   pH 5.0  5.0 - 8.0   Glucose, UA NEGATIVE  NEGATIVE mg/dL   Hgb urine dipstick SMALL (*) NEGATIVE   Bilirubin Urine NEGATIVE  NEGATIVE   Ketones, ur NEGATIVE  NEGATIVE mg/dL   Protein, ur NEGATIVE  NEGATIVE mg/dL   Urobilinogen, UA 0.2  0.0 - 1.0 mg/dL   Nitrite NEGATIVE  NEGATIVE   Leukocytes, UA TRACE (*) NEGATIVE  URINE MICROSCOPIC-ADD ON     Status: Abnormal   Collection Time    12/06/12  9:09 PM      Result Value Range   Squamous Epithelial / LPF RARE  RARE   WBC, UA 0-2  <3 WBC/hpf   RBC / HPF 21-50  <3 RBC/hpf   Bacteria, UA RARE  RARE   Casts GRANULAR CAST (*) NEGATIVE   Comment: HYALINE CASTS   Dg Chest Portable 1 View  12/06/2012  *RADIOLOGY REPORT*  Clinical Data: Shortness of breath.  PORTABLE CHEST - 1 VIEW  Comparison: 01/20/2012.  Findings: The heart is enlarged.  There is tortuosity and calcification of the thoracic aorta.  There is vascular congestion, asymmetric pulmonary edema and bilateral pleural effusions consistent with CHF.  IMPRESSION: Congestive heart failure.   Original Report Authenticated By: Rudie Meyer, M.D.     ECG:  Atrial fibrillation with RVR, nonspecific ST changes.  Prior ecg revealed junctional tachycardia (01/2012).  Prior revealed normal sinus rhythm 01/2010.  Assessment: 6M p/w shortness of breath found to be in Atrial fibrillation with RVR and CHF.  Plan: CHF -Continue gentle diuresis with IV Lasix -Strict I/Os, daily weights, 2gm Na diet, 2L fluid  restriction -TTE in am to reassess LV function. Last known EF 60% in 2011  Afib with RVR --Better control with Dilt gtt started in ED. --Telmetry, ecg with rhythm changes --Heparin gtt for anticoagulaiton --May need TEE/DCCV, unclear duration of Afib.

## 2012-12-06 NOTE — ED Notes (Signed)
Per [facility] pt reported having SOB.  On EMS arrival pt was noted to be able to talk in complete sentences but was noted on EKG to have a HR of 230.  Pt received 2 separate doses of Cardizem 10 mg IV (total dose of 20 mg).  Pt HR decreased down to 140's to 150's.  Pt states that he still feels SOB.

## 2012-12-07 ENCOUNTER — Encounter (HOSPITAL_COMMUNITY): Payer: Self-pay | Admitting: Nurse Practitioner

## 2012-12-07 DIAGNOSIS — R609 Edema, unspecified: Secondary | ICD-10-CM

## 2012-12-07 DIAGNOSIS — I509 Heart failure, unspecified: Secondary | ICD-10-CM

## 2012-12-07 DIAGNOSIS — I059 Rheumatic mitral valve disease, unspecified: Secondary | ICD-10-CM

## 2012-12-07 DIAGNOSIS — I4891 Unspecified atrial fibrillation: Principal | ICD-10-CM

## 2012-12-07 DIAGNOSIS — I10 Essential (primary) hypertension: Secondary | ICD-10-CM

## 2012-12-07 LAB — CBC
Platelets: 191 10*3/uL (ref 150–400)
RBC: 3.7 MIL/uL — ABNORMAL LOW (ref 4.22–5.81)
RDW: 16.8 % — ABNORMAL HIGH (ref 11.5–15.5)
WBC: 13.1 10*3/uL — ABNORMAL HIGH (ref 4.0–10.5)

## 2012-12-07 LAB — BASIC METABOLIC PANEL
Chloride: 105 mEq/L (ref 96–112)
GFR calc Af Amer: 55 mL/min — ABNORMAL LOW (ref >90)
Potassium: 3.6 mEq/L (ref 3.5–5.1)
Sodium: 142 mEq/L (ref 135–145)

## 2012-12-07 LAB — HEPARIN LEVEL (UNFRACTIONATED): Heparin Unfractionated: 0.32 IU/mL (ref 0.30–0.70)

## 2012-12-07 LAB — MAGNESIUM: Magnesium: 2.2 mg/dL (ref 1.5–2.5)

## 2012-12-07 MED ORDER — FUROSEMIDE 10 MG/ML IJ SOLN
40.0000 mg | Freq: Two times a day (BID) | INTRAMUSCULAR | Status: DC
  Administered 2012-12-07 – 2012-12-10 (×7): 40 mg via INTRAVENOUS
  Filled 2012-12-07 (×8): qty 4

## 2012-12-07 MED ORDER — HEPARIN (PORCINE) IN NACL 100-0.45 UNIT/ML-% IJ SOLN
1950.0000 [IU]/h | INTRAMUSCULAR | Status: DC
  Administered 2012-12-07: 1500 [IU]/h via INTRAVENOUS
  Administered 2012-12-07: 1350 [IU]/h via INTRAVENOUS
  Administered 2012-12-08: 1700 [IU]/h via INTRAVENOUS
  Administered 2012-12-08: 1600 [IU]/h via INTRAVENOUS
  Administered 2012-12-09 – 2012-12-10 (×2): 1950 [IU]/h via INTRAVENOUS
  Filled 2012-12-07 (×10): qty 250

## 2012-12-07 MED ORDER — SODIUM CHLORIDE 0.9 % IJ SOLN
3.0000 mL | Freq: Two times a day (BID) | INTRAMUSCULAR | Status: DC
  Administered 2012-12-07 – 2012-12-15 (×9): 3 mL via INTRAVENOUS

## 2012-12-07 MED ORDER — SODIUM CHLORIDE 0.9 % IV SOLN
250.0000 mL | INTRAVENOUS | Status: DC | PRN
  Administered 2012-12-08 – 2012-12-09 (×2): 250 mL via INTRAVENOUS

## 2012-12-07 MED ORDER — SODIUM CHLORIDE 0.9 % IJ SOLN
3.0000 mL | INTRAMUSCULAR | Status: DC | PRN
  Administered 2012-12-10: 3 mL via INTRAVENOUS

## 2012-12-07 MED ORDER — CLONAZEPAM 0.5 MG PO TABS
0.5000 mg | ORAL_TABLET | Freq: Two times a day (BID) | ORAL | Status: DC | PRN
  Administered 2012-12-10 – 2012-12-15 (×4): 0.5 mg via ORAL
  Filled 2012-12-07 (×7): qty 1

## 2012-12-07 MED ORDER — ONDANSETRON HCL 4 MG/2ML IJ SOLN: 4.0000 mg | Freq: Four times a day (QID) | INTRAMUSCULAR | Status: DC | PRN

## 2012-12-07 MED ORDER — ACETAMINOPHEN 325 MG PO TABS
650.0000 mg | ORAL_TABLET | ORAL | Status: DC | PRN
  Administered 2012-12-14: 650 mg via ORAL
  Filled 2012-12-07 (×2): qty 2

## 2012-12-07 MED ORDER — DILTIAZEM HCL 100 MG IV SOLR
5.0000 mg/h | INTRAVENOUS | Status: DC
  Administered 2012-12-07: 5 mg/h via INTRAVENOUS
  Administered 2012-12-07 – 2012-12-09 (×4): 10 mg/h via INTRAVENOUS
  Filled 2012-12-07 (×5): qty 100

## 2012-12-07 MED ORDER — BUPROPION HCL 75 MG PO TABS
75.0000 mg | ORAL_TABLET | Freq: Two times a day (BID) | ORAL | Status: DC
  Administered 2012-12-07 – 2012-12-20 (×28): 75 mg via ORAL
  Filled 2012-12-07 (×31): qty 1

## 2012-12-07 MED ORDER — DOXAZOSIN MESYLATE 1 MG PO TABS
1.0000 mg | ORAL_TABLET | Freq: Every day | ORAL | Status: DC
  Administered 2012-12-07 – 2012-12-08 (×2): 1 mg via ORAL
  Filled 2012-12-07 (×3): qty 1

## 2012-12-07 MED ORDER — DIGOXIN 0.25 MG/ML IJ SOLN
0.2500 mg | Freq: Four times a day (QID) | INTRAMUSCULAR | Status: AC
  Administered 2012-12-07 – 2012-12-08 (×3): 0.25 mg via INTRAVENOUS
  Filled 2012-12-07 (×3): qty 1

## 2012-12-07 MED ORDER — BIOTENE DRY MOUTH MT LIQD
15.0000 mL | Freq: Two times a day (BID) | OROMUCOSAL | Status: DC
  Administered 2012-12-07 – 2012-12-20 (×23): 15 mL via OROMUCOSAL

## 2012-12-07 MED ORDER — HEPARIN BOLUS VIA INFUSION
3000.0000 [IU] | Freq: Once | INTRAVENOUS | Status: AC
  Administered 2012-12-07: 3000 [IU] via INTRAVENOUS
  Filled 2012-12-07: qty 3000

## 2012-12-07 MED ORDER — FUROSEMIDE 10 MG/ML IJ SOLN
20.0000 mg | Freq: Once | INTRAMUSCULAR | Status: AC
  Administered 2012-12-07: 20 mg via INTRAVENOUS

## 2012-12-07 MED ORDER — ASPIRIN EC 81 MG PO TBEC
81.0000 mg | DELAYED_RELEASE_TABLET | Freq: Every day | ORAL | Status: DC
  Administered 2012-12-07 – 2012-12-15 (×8): 81 mg via ORAL
  Filled 2012-12-07 (×10): qty 1

## 2012-12-07 MED ORDER — HEPARIN BOLUS VIA INFUSION
1350.0000 [IU] | Freq: Once | INTRAVENOUS | Status: AC
  Administered 2012-12-07: 1350 [IU] via INTRAVENOUS
  Filled 2012-12-07: qty 1350

## 2012-12-07 NOTE — Progress Notes (Signed)
cardizem drip @ 5mg /hr started per order of DR. Rothbart, Pt's HR is in the 120's-130's.

## 2012-12-07 NOTE — Progress Notes (Signed)
Pt's HR is in the 130's-140's non sustained, pt asleep at this time. MD notified, awaiting orders at this time.

## 2012-12-07 NOTE — Care Management Note (Signed)
CARE MANAGEMENT NOTE 12/07/2012  Patient:  BARNET, BENAVIDES   Account Number:  [id number]  Date Initiated:  12/07/2012  Documentation initiated by:  Vance Peper  Subjective/Objective Assessment:   79 yr old male admitted for new onset afib.     Action/Plan:   CM spoke with patient concerning home health needs at discharge. Choice offered.CM Faxed orders for [facility] to [facility].   Anticipated DC Date:  12/10/2012   Anticipated DC Plan:  HOME W HOME HEALTH SERVICES      DC Planning Services  CM consult      [facility] Choice  HOME HEALTH   Choice offered to / List presented to:  C-1 Patient        HH arranged  HH-1 RN  HH-10 DISEASE MANAGEMENT      HH agency  [facility]   Status of service:  Completed, signed off Medicare Important Message given?   (If response is "NO", the following Medicare IM given date fields will be blank) Date Medicare IM given:   Date Additional Medicare IM given:    Discharge Disposition:  HOME W HOME HEALTH SERVICES  Per UR Regulation:    If discussed at Long Length of Stay Meetings, dates discussed:    Comments:

## 2012-12-07 NOTE — Progress Notes (Signed)
ANTICOAGULATION CONSULT NOTE - Initial Consult  Pharmacy Consult for Heparin Indication: Atrial fibrillation   Allergies  Allergen Reactions  . Darifenacin Hydrobromide     REACTION: constipation  . Fluoxetine Hcl     REACTION: Very Depressed while taking    Patient Measurements: Height: 6' (182.9 cm) Weight: 202 lb 3.2 oz (91.717 kg) (scale B) IBW/kg (Calculated) : 77.6 Heparin Dosing Weight: 90 kg   Vital Signs: Temp: 97.1 F (36.2 C) (04/19 0222) Temp src: Oral (04/19 0222) BP: 101/75 mmHg (04/19 0222) Pulse Rate: 120 (04/19 0222)  Labs:  Recent Labs  12/06/12 1710  HGB 10.9*  HCT 34.4*  PLT 204  CREATININE 1.38*    Estimated Creatinine Clearance: 42.2 ml/min (by C-G formula based on Cr of 1.38).   Medical History: Past Medical History  Diagnosis Date  . Anemia     nos  . History of colonic polyps   . Hyperlipidemia   . Hypertension   . Cancer [phone]    hx of basal cell  . Total knee replacement status     right  . BPH (benign prostatic hyperplasia)   . Anxiety attack     panic   . Pre-syncope     echo 03/2010,with EF 60%,mild RV dilation,no significant abnormalities...event monitor for 3wks/episodes os sinus bradycardia with heart rate to low 40's occasionally at night(suspect while sleeping)    Medications:  Prescriptions prior to admission  Medication Sig Dispense Refill  . acetaminophen (RA ARTHRITIS PAIN RELIEF) 650 MG CR tablet Take 650 mg by mouth Nightly.        [name] aspirin 81 MG tablet Take 81 mg by mouth daily.        [name] buPROPion (WELLBUTRIN) 75 MG tablet TAKE 1 TABLET TWICE A DAY  60 tablet  4  . clonazePAM (KLONOPIN) 0.5 MG tablet TAKE 1 TAB IN THE MORNING & 2 TABS AT BEDTIME (THIS MED IS "AS NEEDED" & NOT TAKEN ON REGULAR BASIS)  90 tablet  0  . doxazosin (CARDURA) 2 MG tablet TAKE 1/2 TABLET BY MOUTH EVERY DAY AT BEDTIME  90 tablet  1  . Ginkgo Biloba 120 MG CAPS Take 1 capsule by mouth every other day.       . hydrocortisone  (ANUSOL-HC) 25 MG suppository Place 1 suppository (25 mg total) rectally 2 (two) times daily.  14 suppository  0  . multivitamin (METANX) 3-35-2 MG TABS tablet Take 1 tablet by mouth 2 (two) times daily.        Assessment: 79 yo male with Afib for heparin  Goal of Therapy:  Heparin level 0.3-0.7 units/ml Monitor platelets by anticoagulation protocol: Yes   Plan:  Heparin 3000 units IV bolus, then 1350 units/hr Check heparin level in 8 hours.  Eddie Candle 12/07/2012,2:29 AM

## 2012-12-07 NOTE — Progress Notes (Signed)
Admitted pt to rm [id number] from ED via stretcher, pt oriented to room, call bell placed within reach, orders carried out, admission assessment done. Will continue to monitor.

## 2012-12-07 NOTE — Progress Notes (Signed)
  Echocardiogram 2D Echocardiogram has been performed.  [name] FRANCES 12/07/2012, 5:28 PM 

## 2012-12-07 NOTE — Progress Notes (Addendum)
ANTICOAGULATION CONSULT NOTE - Follow Up Consult  Pharmacy Consult for heparin Indication: atrial fibrillation  Allergies  Allergen Reactions  . Darifenacin Hydrobromide     REACTION: constipation  . Fluoxetine Hcl     REACTION: Very Depressed while taking    Patient Measurements: Height: 6' (182.9 cm) Weight: 202 lb 3.2 oz (91.717 kg) (scale B) IBW/kg (Calculated) : 77.6 Heparin Dosing Weight: 90 kg  Vital Signs: Temp: 97.9 F (36.6 C) (04/19 0557) Temp src: Oral (04/19 0557) BP: 97/66 mmHg (04/19 0730) Pulse Rate: 127 (04/19 0730)  Labs:  Recent Labs  12/06/12 1710 12/07/12 0500 12/07/12 1011  HGB 10.9* 10.3*  --   HCT 34.4* 32.5*  --   PLT 204 191  --   HEPARINUNFRC  --   --  0.27*  CREATININE 1.38* 1.31  --     Estimated Creatinine Clearance: 44.4 ml/min (by C-G formula based on Cr of 1.31).   Assessment: 79 yo male with Afib for heparin. HL today slightly SUB-therapeutic at 0.27. Slight Hgb drop to 10.3 from 10.9, plts ok at 191. No bleeding or infusion line issues per RN.  Goal of Therapy:  Heparin level 0.3-0.7 units/ml Monitor platelets by anticoagulation protocol: Yes   Plan:  - heparin bolus 1350 units IV x1 - increase heparin to 1500 units/hr - 8 hour level  - daily heparin level, CBC - monitor for bleeding  Thank you for the consult.  Tomi Bamberger, PharmD Clinical Pharmacist Pager: [phone] Pharmacy: [phone] 12/07/2012 10:56 AM

## 2012-12-07 NOTE — Progress Notes (Signed)
Primary cardiologist: Dr. [name] (2011 OV)  Subjective:   Feels somewhat better, less short of breath. No frank sense of palpitations or chest pain.   Objective:   Temp:  [97.1 F (36.2 C)-97.9 F (36.6 C)] 97.9 F (36.6 C) (04/19 0557) Pulse Rate:  [63-133] 127 (04/19 0730) Resp:  [14-34] 21 (04/19 0222) BP: (97-127)/(64-93) 97/66 mmHg (04/19 0730) SpO2:  [91 %-97 %] 94 % (04/19 0557) Weight:  [202 lb 3.2 oz (91.717 kg)] 202 lb 3.2 oz (91.717 kg) (04/19 0222) Last BM Date: 12/05/12  Filed Weights   12/07/12 0222  Weight: 202 lb 3.2 oz (91.717 kg)    Intake/Output Summary (Last 24 hours) at 12/07/12 1130 Last data filed at 12/07/12 1011  Gross per 24 hour  Intake   42.3 ml  Output   1525 ml  Net -1482.7 ml   Telemetry: Atrial fibrillation.  Exam:  General: Elderly male, chronically ill-appearing. NAD.  Lungs: Scattered rhonchi and crackles at the bases.  Cardiac: Irregularly irregular, indistinct PMI, no definite gallop.  Abdomen: NABS.  Extremities: 1+ edema.  Lab Results:  Basic Metabolic Panel:  Recent Labs Lab 12/06/12 1710 12/07/12 0500  NA 141 142  K 4.8 3.6  CL 106 105  CO2 24 22  GLUCOSE 125* 104*  BUN 41* 38*  CREATININE 1.38* 1.31  CALCIUM 9.4 8.7  MG 2.5 2.2    Liver Function Tests:  Recent Labs Lab 12/06/12 1710  AST 82*  ALT 89*  ALKPHOS 85  BILITOT 0.8  PROT 7.1  ALBUMIN 3.5    CBC:  Recent Labs Lab 12/06/12 1710 12/07/12 0500  WBC 14.0* 13.1*  HGB 10.9* 10.3*  HCT 34.4* 32.5*  MCV 88.7 87.8  PLT 204 191    BNP:  Recent Labs  12/06/12 1710  PROBNP 10333.0*    ECG:  Atrial fibrillation with low-voltage, possible old inferior infarct pattern, poor R wave progression, nonspecific ST changes.   Medications:   Scheduled Medications: . antiseptic oral rinse  15 mL Mouth Rinse BID  . aspirin EC  81 mg Oral Daily  . buPROPion  75 mg Oral BID WC  . diltiazem (CARDIZEM) infusion  10 mg/hr  Intravenous Once  . doxazosin  1 mg Oral QHS  . furosemide  40 mg Intravenous BID  . heparin  1,350 Units Intravenous Once  . sodium chloride  3 mL Intravenous Q12H     Infusions: . diltiazem (CARDIZEM) infusion 5 mg/hr (12/07/12 0631)  . heparin 1,350 Units/hr (12/07/12 0252)     PRN Medications:  sodium chloride, acetaminophen, clonazePAM, ondansetron (ZOFRAN) IV, sodium chloride   Assessment:   1. Newly documented atrial fibrillation with RVR, duration is uncertain. CHADS 2 score is 2 based on age and hypertension. He is on heparin and Cardizem infusions.  2. Shortness of breath, evidence of cardiac enlargement and pulmonary edema with bilateral pleural effusions by chest x-ray consistent with congestive heart failure. Followup LVEF pending with previous assessment 65% in 2011.  3. Hypertension, adequate control recently.  4. History of hyperlipidemia, not on statin.  Plan/Discussion:    Patient admitted by fellow yesterday. Presents with worsening shortness of breath over the last few weeks, question whether he has been in atrial fibrillation at least that long, perhaps longer resulting in heart failure symptoms. Plan at this time will be to continue Cardizem IV, add digoxin for additional rate control, continue diuresis. Followup echocardiogram to reassess LVEF. If he has reduction in function, may need TEE  DCCV sooner rather than later to help restore sinus rhythm and then work on medical therapy from there. Need to cycle cardiac markers, no clear ACS at this point, although baseline ECG is abnormal. Can consider oral anticoagulants after more information is available.   Jonelle Sidle, M.D., F.A.C.C.

## 2012-12-07 NOTE — Progress Notes (Signed)
ANTICOAGULATION CONSULT NOTE - Follow Up Consult  Pharmacy Consult for heparin Indication: atrial fibrillation  Allergies  Allergen Reactions  . Darifenacin Hydrobromide     REACTION: constipation  . Fluoxetine Hcl     REACTION: Very Depressed while taking    Patient Measurements: Height: 6' (182.9 cm) Weight: 202 lb 3.2 oz (91.717 kg) (scale B) IBW/kg (Calculated) : 77.6 Heparin Dosing Weight: 90 kg  Vital Signs: Temp: 98 F (36.7 C) (04/19 1442) Temp src: Oral (04/19 1442) BP: 126/63 mmHg (04/19 2000) Pulse Rate: 90 (04/19 2000)  Labs:  Recent Labs  12/06/12 1710 12/07/12 0500 12/07/12 1011 12/07/12 1955  HGB 10.9* 10.3*  --   --   HCT 34.4* 32.5*  --   --   PLT 204 191  --   --   HEPARINUNFRC  --   --  0.27* 0.32  CREATININE 1.38* 1.31  --   --     Estimated Creatinine Clearance: 44.4 ml/min (by C-G formula based on Cr of 1.31).   Assessment: 79 yo male with Afib for heparin. HL just inside therapeutic range. No bleeding or infusion line issues per RN.  Goal of Therapy:  Heparin level 0.3-0.7 units/ml Monitor platelets by anticoagulation protocol: Yes   Plan:  - increase heparin to 1600 units/hr - level with AML - daily heparin level, CBC - monitor for bleeding   Thank you for allowing pharmacy to be a part of this patients care team.  Lovenia Kim Pharm.D., BCPS Clinical Pharmacist 12/07/2012 8:45 PM Pager: [phone] Phone: [phone]

## 2012-12-08 LAB — BASIC METABOLIC PANEL
CO2: 28 mEq/L (ref 19–32)
Calcium: 8.3 mg/dL — ABNORMAL LOW (ref 8.4–10.5)
Creatinine, Ser: 1.08 mg/dL (ref 0.50–1.35)
GFR calc Af Amer: 70 mL/min — ABNORMAL LOW (ref >90)
Sodium: 141 mEq/L (ref 135–145)

## 2012-12-08 LAB — HEPARIN LEVEL (UNFRACTIONATED)
Heparin Unfractionated: 0.27 IU/mL — ABNORMAL LOW (ref 0.30–0.70)
Heparin Unfractionated: 0.36 IU/mL (ref 0.30–0.70)

## 2012-12-08 LAB — CBC
MCV: 86.8 fL (ref 78.0–100.0)
Platelets: 180 10*3/uL (ref 150–400)
RBC: 3.7 MIL/uL — ABNORMAL LOW (ref 4.22–5.81)
RDW: 16.5 % — ABNORMAL HIGH (ref 11.5–15.5)
WBC: 9.5 10*3/uL (ref 4.0–10.5)

## 2012-12-08 MED ORDER — DIGOXIN 125 MCG PO TABS
0.1250 mg | ORAL_TABLET | Freq: Every day | ORAL | Status: DC
  Administered 2012-12-08 – 2012-12-12 (×5): 0.125 mg via ORAL
  Filled 2012-12-08 (×6): qty 1

## 2012-12-08 NOTE — Progress Notes (Signed)
ANTICOAGULATION CONSULT NOTE - Follow Up Consult  Pharmacy Consult for heaprin Indication: atrial fibrillation  Allergies  Allergen Reactions  . Darifenacin Hydrobromide     REACTION: constipation  . Fluoxetine Hcl     REACTION: Very Depressed while taking    Patient Measurements: Height: 6' (182.9 cm) Weight: 197 lb 15.6 oz (89.8 kg) IBW/kg (Calculated) : 77.6 Heparin Dosing Weight: 90kg  Vital Signs: Temp: 97.8 F (36.6 C) (04/20 0538) Temp src: Oral (04/20 0538) BP: 116/50 mmHg (04/20 0538) Pulse Rate: 84 (04/20 0538)  Labs:  Recent Labs  12/06/12 1710 12/07/12 0500 12/07/12 1011 12/07/12 1955 12/08/12 0550  HGB 10.9* 10.3*  --   --  10.0*  HCT 34.4* 32.5*  --   --  32.1*  PLT 204 191  --   --  180  HEPARINUNFRC  --   --  0.27* 0.32 0.27*  CREATININE 1.38* 1.31  --   --  1.08    Estimated Creatinine Clearance: 53.9 ml/min (by C-G formula based on Cr of 1.08).    Assessment: 79 yo male with Afib for heparin. Heparin today slightly SUB-therapeutic at 0.27  on 1600/hr, despite rate increase after level in therapeutic range last night. Slight Hgb drop to 10.0, plts ok. No bleeding or infusion line issues per RN  Goal of Therapy:  Heparin level 0.3-0.7 units/ml Monitor platelets by anticoagulation protocol: Yes   Plan:  - increase heparin to 1700 units/hr  - 8 hour level  - daily heparin level, CBC  - monitor for bleeding   Thank you for the consult.  Tomi Bamberger, PharmD Clinical Pharmacist Pager: [phone] Pharmacy: [phone] 12/08/2012 8:20 AM

## 2012-12-08 NOTE — Progress Notes (Signed)
Primary cardiologist: Dr. [name] (2011 OV)  Subjective:   Feels better, no palpitations. No chest pain or breathlessness at rest.   Objective:   Temp:  [97.8 F (36.6 C)-98 F (36.7 C)] 97.8 F (36.6 C) (04/20 0538) Pulse Rate:  [69-124] 84 (04/20 0538) Resp:  [18-24] 18 (04/20 0538) BP: (83-126)/(50-99) 116/50 mmHg (04/20 0538) SpO2:  [93 %-99 %] 93 % (04/20 0538) Weight:  [197 lb 15.6 oz (89.8 kg)] 197 lb 15.6 oz (89.8 kg) (04/20 0538) Last BM Date: 12/06/12  Filed Weights   12/07/12 0222 12/08/12 0538  Weight: 202 lb 3.2 oz (91.717 kg) 197 lb 15.6 oz (89.8 kg)    Intake/Output Summary (Last 24 hours) at 12/08/12 0924 Last data filed at 12/08/12 0903  Gross per 24 hour  Intake   1120 ml  Output   2250 ml  Net  -1130 ml   Telemetry: Atrial fibrillation, better heart rate control.  Exam:  General: Elderly male, chronically ill-appearing. NAD.  Lungs: Scattered rhonchi and crackles at the bases.  Cardiac: Irregularly irregular, indistinct PMI, no definite gallop.  Abdomen: NABS.  Extremities: 1+ edema.  Lab Results:  Basic Metabolic Panel:  Recent Labs Lab 12/06/12 1710 12/07/12 0500 12/08/12 0550  NA 141 142 141  K 4.8 3.6 2.8*  CL 106 105 104  CO2 24 22 28   GLUCOSE 125* 104* 101*  BUN 41* 38* 30*  CREATININE 1.38* 1.31 1.08  CALCIUM 9.4 8.7 8.3*  MG 2.5 2.2  --     CBC:  Recent Labs Lab 12/06/12 1710 12/07/12 0500 12/08/12 0550  WBC 14.0* 13.1* 9.5  HGB 10.9* 10.3* 10.0*  HCT 34.4* 32.5* 32.1*  MCV 88.7 87.8 86.8  PLT 204 191 180    BNP:  Recent Labs  12/06/12 1710  PROBNP 10333.0*    ECG:  Atrial fibrillation with low-voltage, possible old inferior infarct pattern, poor R wave progression, nonspecific ST changes.   Medications:   Scheduled Medications: . antiseptic oral rinse  15 mL Mouth Rinse BID  . aspirin EC  81 mg Oral Daily  . buPROPion  75 mg Oral BID WC  . diltiazem (CARDIZEM) infusion  10 mg/hr  Intravenous Once  . doxazosin  1 mg Oral QHS  . furosemide  40 mg Intravenous BID  . sodium chloride  3 mL Intravenous Q12H    Infusions: . diltiazem (CARDIZEM) infusion 10 mg/hr (12/08/12 0027)  . heparin 1,700 Units/hr (12/08/12 0819)    PRN Medications: sodium chloride, acetaminophen, clonazePAM, ondansetron (ZOFRAN) IV, sodium chloride   Assessment:   1. Newly documented atrial fibrillation with RVR, duration is uncertain. CHADS 2 score is 2 based on age and hypertension. He is on heparin and Cardizem infusions. Heart rate control better after addition of IV digoxin yesterday.  2. Shortness of breath, evidence of cardiac enlargement and pulmonary edema with bilateral pleural effusions by chest x-ray consistent with congestive heart failure. Followup LVEF pending with previous assessment 65% in 2011.  3. Hypertension, adequate control recently.  4. History of hyperlipidemia, not on statin.  Plan/Discussion:    Need to followup on echocardiogram today for reassessment of LV function. If he has evidence of systolic dysfunction, may want to consider TEE DCCV in the near future followed by ischemic evaluation, likely catheterization prior to initiating oral anticoagulant. If he has normal LV function, we may be able to transition to an oral anticoagulant (Eliquis?), and be cardioverted at later time. Dr. Shirlee Latch to take over his care  tomorrow. For now willl keep him on aspirin, heparin, Cardizem infusion, and oral digoxin. Continues to diurese.   Jonelle Sidle, M.D., F.A.C.C.

## 2012-12-08 NOTE — Progress Notes (Signed)
ANTICOAGULATION CONSULT NOTE - Follow Up Consult  Pharmacy Consult for heaprin Indication: atrial fibrillation  Allergies  Allergen Reactions  . Darifenacin Hydrobromide     REACTION: constipation  . Fluoxetine Hcl     REACTION: Very Depressed while taking    Patient Measurements: Height: 6' (182.9 cm) Weight: 197 lb 15.6 oz (89.8 kg) IBW/kg (Calculated) : 77.6 Heparin Dosing Weight: 90kg  Vital Signs: Temp: 97.8 F (36.6 C) (04/20 1424) Temp src: Oral (04/20 1424) BP: 120/64 mmHg (04/20 1424) Pulse Rate: 67 (04/20 1424)  Labs:  Recent Labs  12/06/12 1710 12/07/12 0500  12/07/12 1955 12/08/12 0550 12/08/12 1649  HGB 10.9* 10.3*  --   --  10.0*  --   HCT 34.4* 32.5*  --   --  32.1*  --   PLT 204 191  --   --  180  --   HEPARINUNFRC  --   --   < > 0.32 0.27* 0.36  CREATININE 1.38* 1.31  --   --  1.08  --   < > = values in this interval not displayed.  Estimated Creatinine Clearance: 53.9 ml/min (by C-G formula based on Cr of 1.08).    Assessment: 79 yo male with Afib for heparin. Heparin level today was slightly SUB-therapeutic at 0.27  on 1600/hr.   Slight Hgb drop to 10.0, plts ok. No bleeding or infusion line issues per RN.  Heparin drip was increased to 1700 uts/hr HL now 0.36 within range.    Goal of Therapy:  Heparin level 0.3-0.7 units/ml Monitor platelets by anticoagulation protocol: Yes   Plan:  - Continue heparin to 1700 units/hr  - daily heparin level, CBC  - monitor for bleeding   Leota Sauers Pharm.D. CPP, BCPS Clinical Pharmacist [phone] 12/08/2012 5:43 PM

## 2012-12-09 DIAGNOSIS — I5021 Acute systolic (congestive) heart failure: Secondary | ICD-10-CM

## 2012-12-09 DIAGNOSIS — I509 Heart failure, unspecified: Secondary | ICD-10-CM

## 2012-12-09 LAB — BASIC METABOLIC PANEL
BUN: 21 mg/dL (ref 6–23)
CO2: 29 mEq/L (ref 19–32)
Calcium: 8.1 mg/dL — ABNORMAL LOW (ref 8.4–10.5)
Chloride: 101 mEq/L (ref 96–112)
Creatinine, Ser: 0.91 mg/dL (ref 0.50–1.35)
GFR calc Af Amer: 86 mL/min — ABNORMAL LOW (ref >90)
GFR calc non Af Amer: 75 mL/min — ABNORMAL LOW (ref >90)
Glucose, Bld: 103 mg/dL — ABNORMAL HIGH (ref 70–99)
Potassium: 3 mEq/L — ABNORMAL LOW (ref 3.5–5.1)
Sodium: 138 mEq/L (ref 135–145)

## 2012-12-09 LAB — CBC
HCT: 31.4 % — ABNORMAL LOW (ref 39.0–52.0)
Hemoglobin: 10.1 g/dL — ABNORMAL LOW (ref 13.0–17.0)
MCH: 27.9 pg (ref 26.0–34.0)
MCHC: 32.2 g/dL (ref 30.0–36.0)
MCV: 86.7 fL (ref 78.0–100.0)
Platelets: 168 10*3/uL (ref 150–400)
RBC: 3.62 MIL/uL — ABNORMAL LOW (ref 4.22–5.81)
RDW: 16.4 % — ABNORMAL HIGH (ref 11.5–15.5)
WBC: 10 10*3/uL (ref 4.0–10.5)

## 2012-12-09 LAB — HEPARIN LEVEL (UNFRACTIONATED): Heparin Unfractionated: 0.19 IU/mL — ABNORMAL LOW (ref 0.30–0.70)

## 2012-12-09 LAB — PROTIME-INR
INR: 1.51 — ABNORMAL HIGH (ref 0.00–1.49)
Prothrombin Time: 17.8 seconds — ABNORMAL HIGH (ref 11.6–15.2)

## 2012-12-09 MED ORDER — METOPROLOL SUCCINATE ER 50 MG PO TB24
50.0000 mg | ORAL_TABLET | Freq: Two times a day (BID) | ORAL | Status: DC
  Filled 2012-12-09 (×3): qty 1

## 2012-12-09 MED ORDER — POTASSIUM CHLORIDE CRYS ER 20 MEQ PO TBCR
40.0000 meq | EXTENDED_RELEASE_TABLET | Freq: Once | ORAL | Status: AC
  Administered 2012-12-09: 40 meq via ORAL
  Filled 2012-12-09: qty 2

## 2012-12-09 MED ORDER — AMIODARONE HCL 200 MG PO TABS
400.0000 mg | ORAL_TABLET | Freq: Two times a day (BID) | ORAL | Status: DC
  Administered 2012-12-09 – 2012-12-15 (×14): 400 mg via ORAL
  Filled 2012-12-09 (×16): qty 2

## 2012-12-09 MED ORDER — SODIUM CHLORIDE 0.9 % IJ SOLN
3.0000 mL | Freq: Two times a day (BID) | INTRAMUSCULAR | Status: DC
  Administered 2012-12-09: 3 mL via INTRAVENOUS

## 2012-12-09 MED ORDER — ASPIRIN 81 MG PO CHEW
324.0000 mg | CHEWABLE_TABLET | ORAL | Status: AC
  Administered 2012-12-10: 324 mg via ORAL
  Filled 2012-12-09: qty 4

## 2012-12-09 MED ORDER — METOPROLOL SUCCINATE ER 50 MG PO TB24
50.0000 mg | ORAL_TABLET | Freq: Every day | ORAL | Status: DC
  Administered 2012-12-09 – 2012-12-11 (×2): 50 mg via ORAL
  Filled 2012-12-09 (×3): qty 1

## 2012-12-09 MED ORDER — SODIUM CHLORIDE 0.9 % IV SOLN: 250.0000 mL | INTRAVENOUS | Status: DC | PRN

## 2012-12-09 MED ORDER — SODIUM CHLORIDE 0.9 % IJ SOLN: 3.0000 mL | INTRAMUSCULAR | Status: DC | PRN

## 2012-12-09 MED ORDER — LISINOPRIL 2.5 MG PO TABS
2.5000 mg | ORAL_TABLET | Freq: Two times a day (BID) | ORAL | Status: DC
  Administered 2012-12-09 – 2012-12-12 (×6): 2.5 mg via ORAL
  Filled 2012-12-09 (×10): qty 1

## 2012-12-09 NOTE — Progress Notes (Signed)
ANTICOAGULATION CONSULT NOTE - Follow Up Consult  Pharmacy Consult for Heparin Indication: atrial fibrillation  Heparin Dosing Weight: 90 kg  Labs:  Recent Labs  12/07/12 0500 12/08/12 0550 12/08/12 1649 12/09/12 0658  HGB 10.3* 10.0*  --  10.1*  HCT 32.5* 32.1*  --  31.4*  PLT 191 180  --  168  HEPARINUNFRC  --  0.27* 0.36 0.19*  CREATININE 1.31 1.08  --  0.91   No results found for this basename: INR, PROTIME   Medications:  Scheduled:  . amiodarone  400 mg Oral BID  . antiseptic oral rinse  15 mL Mouth Rinse BID  . aspirin EC  81 mg Oral Daily  . buPROPion  75 mg Oral BID WC  . digoxin  0.125 mg Oral Daily  . [COMPLETED] diltiazem (CARDIZEM) infusion  10 mg/hr Intravenous Once  . furosemide  40 mg Intravenous BID  . lisinopril  2.5 mg Oral BID  . metoprolol succinate  50 mg Oral Daily  . potassium chloride  40 mEq Oral Once  . potassium chloride  40 mEq Oral Once  . sodium chloride  3 mL Intravenous Q12H  . [DISCONTINUED] doxazosin  1 mg Oral QHS  . [DISCONTINUED] metoprolol succinate  50 mg Oral BID   Infusions:  . heparin 1,700 Units/hr (12/08/12 2146)  . [DISCONTINUED] diltiazem (CARDIZEM) infusion Stopped (12/09/12 1146)   Assessment:  79 y/o male started on Heparin infusion for new onset of atrial fibrillation.  Heparin level SUB-therapeutic this AM,  0.19 unit/ml.  No bleeding complications noted .  Patient scheduled for cath, then TEE guided DCCV with anticipated long-term oral anticoagulation post-cardioversion.  Goal of Therapy:  Heparin level 0.3-0.7 units/ml   Plan:  Increase Heparin infusion rate to 1950 units/hr.  Will check Heparin Level and baseline INR in 8 hours  Daily Heparin level, INR, CBC.  Monitor for bleeding complications   Rayford Halsted.D 12/09/2012, 12:08 PM

## 2012-12-09 NOTE — Progress Notes (Signed)
ANTICOAGULATION CONSULT NOTE - Follow Up Consult  Pharmacy Consult for Heparin Indication: atrial fibrillation  Heparin Dosing Weight: 90 kg  Labs:  Recent Labs  12/07/12 0500 12/08/12 0550 12/08/12 1649 12/09/12 0658  HGB 10.3* 10.0*  --  10.1*  HCT 32.5* 32.1*  --  31.4*  PLT 191 180  --  168  HEPARINUNFRC  --  0.27* 0.36 0.19*  CREATININE 1.31 1.08  --  0.91   Lab Results  Component Value Date   INR 1.51* 12/09/2012   Medications:  Scheduled:  . amiodarone  400 mg Oral BID  . antiseptic oral rinse  15 mL Mouth Rinse BID  . [START ON 12/10/2012] aspirin  324 mg Oral Pre-Cath  . aspirin EC  81 mg Oral Daily  . buPROPion  75 mg Oral BID WC  . digoxin  0.125 mg Oral Daily  . furosemide  40 mg Intravenous BID  . lisinopril  2.5 mg Oral BID  . metoprolol succinate  50 mg Oral Daily  . [COMPLETED] potassium chloride  40 mEq Oral Once  . [COMPLETED] potassium chloride  40 mEq Oral Once  . sodium chloride  3 mL Intravenous Q12H  . sodium chloride  3 mL Intravenous Q12H  . [DISCONTINUED] doxazosin  1 mg Oral QHS  . [DISCONTINUED] metoprolol succinate  50 mg Oral BID   Infusions:  . heparin 1,700 Units/hr (12/08/12 2146)  . [DISCONTINUED] diltiazem (CARDIZEM) infusion Stopped (12/09/12 1146)   Assessment:  79 y/o male started on Heparin infusion for new onset of atrial fibrillation.  Heparin level now therapeutic (0.41) on IV heparin rate of 1950 units/hr.  No bleeding complications noted .  Patient scheduled for cath, then TEE guided DCCV with anticipated long-term oral anticoagulation post-cardioversion.  Baseline INR is 17.8 sec/1.51  Goal of Therapy:  Heparin level 0.3-0.7 units/ml   Plan:  Continue IV Heparin infusion at 1950 units/hr.  Daily Heparin level, INR, CBC.  Monitor for bleeding complications   Nadara Mustard, PharmD., MS Clinical Pharmacist Pager:  [phone] Thank you for allowing pharmacy to be part of this patients care team. 12/09/2012,  10:02 PM

## 2012-12-09 NOTE — Progress Notes (Signed)
Patient has declined services.  He feels that he can self manage his care with his current support.  Of note, [facility] Care Management services does not replace or interfere with any services that are arranged by inpatient case management or social work.  For additional questions or referrals please contact Anibal Henderson BSN RN [facility] Liaison at [phone].

## 2012-12-09 NOTE — Progress Notes (Signed)
[facility]  Patient Status: New  [facility] is providing the following services: RN.  This was a weekend referral called/faxed into the office.  We will continue to follow her until she is discharged.  If patient discharges after hours, please call [phone].   [name] 12/09/2012, 11:37 AM

## 2012-12-09 NOTE — Progress Notes (Signed)
Utilization Review Completed.   Sangita Zani, RN, BSN Nurse Case Manager  [phone]  

## 2012-12-09 NOTE — Progress Notes (Signed)
Patient ID: [name], male   DOB: 05/29/1925, 79 y.o.   MRN: [id number]    SUBJECTIVE: Breathing better with diuresis, HR controlled but still in atrial fibrillation.  Echo yesterday with EF 25-30%, global hypokinesis but some regionality in the basal inferior and inferoseptal walls. No chest pain.  Has felt "bad" weeks.   . amiodarone  400 mg Oral BID  . antiseptic oral rinse  15 mL Mouth Rinse BID  . aspirin EC  81 mg Oral Daily  . buPROPion  75 mg Oral BID WC  . digoxin  0.125 mg Oral Daily  . furosemide  40 mg Intravenous BID  . lisinopril  2.5 mg Oral BID  . metoprolol succinate  50 mg Oral Daily  . potassium chloride  40 mEq Oral Once  . potassium chloride  40 mEq Oral Once  . sodium chloride  3 mL Intravenous Q12H  heparin gtt  Filed Vitals:   12/08/12 1424 12/08/12 1937 12/09/12 0533 12/09/12 0845  BP: 120/64 123/68 119/54 116/58  Pulse: 67 100 80 86  Temp: 97.8 F (36.6 C) 97.9 F (36.6 C) 97.7 F (36.5 C)   TempSrc: Oral Oral Oral   Resp: 19 20 20 20  Height:      Weight:   195 lb 12.3 oz (88.8 kg)   SpO2: 96% 95% 97% 96%    Intake/Output Summary (Last 24 hours) at 12/09/12 0915 Last data filed at 12/09/12 0855  Gross per 24 hour  Intake  755.5 ml  Output   2725 ml  Net -1969.5 ml    LABS: Basic Metabolic Panel:  Recent Labs  12/06/12 1710 12/07/12 0500 12/08/12 0550 12/09/12 0658  NA 141 142 141 138  K 4.8 3.6 2.8* 3.0*  CL 106 105 104 101  CO2 24 22 28 29  GLUCOSE 125* 104* 101* 103*  BUN 41* 38* 30* 21  CREATININE 1.38* 1.31 1.08 0.91  CALCIUM 9.4 8.7 8.3* 8.1*  MG 2.5 2.2  --   --    Liver Function Tests:  Recent Labs  12/06/12 1710  AST 82*  ALT 89*  ALKPHOS 85  BILITOT 0.8  PROT 7.1  ALBUMIN 3.5   No results found for this basename: LIPASE, AMYLASE,  in the last 72 hours CBC:  Recent Labs  12/06/12 1710  12/08/12 0550 12/09/12 0658  WBC 14.0*  < > 9.5 10.0  NEUTROABS 11.7*  --   --   --   HGB 10.9*  < > 10.0* 10.1*   HCT 34.4*  < > 32.1* 31.4*  MCV 88.7  < > 86.8 86.7  PLT 204  < > 180 168  < > = values in this interval not displayed. Cardiac Enzymes: No results found for this basename: CKTOTAL, CKMB, CKMBINDEX, TROPONINI,  in the last 72 hours BNP: No components found with this basename: POCBNP,  D-Dimer: No results found for this basename: DDIMER,  in the last 72 hours Hemoglobin A1C: No results found for this basename: HGBA1C,  in the last 72 hours Fasting Lipid Panel: No results found for this basename: CHOL, HDL, LDLCALC, TRIG, CHOLHDL, LDLDIRECT,  in the last 72 hours Thyroid Function Tests:  Recent Labs  12/06/12 1710  TSH 3.000   Anemia Panel: No results found for this basename: VITAMINB12, FOLATE, FERRITIN, TIBC, IRON, RETICCTPCT,  in the last 72 hours  RADIOLOGY: Dg Chest Portable 1 View  12/06/2012  *RADIOLOGY REPORT*  Clinical Data: Shortness of breath.  PORTABLE CHEST -   1 VIEW  Comparison: 01/20/2012.  Findings: The heart is enlarged.  There is tortuosity and calcification of the thoracic aorta.  There is vascular congestion, asymmetric pulmonary edema and bilateral pleural effusions consistent with CHF.  IMPRESSION: Congestive heart failure.   Original Report Authenticated By: P. Gallerani, M.D.     PHYSICAL EXAM General: NAD Neck: JVP 10, no thyromegaly or thyroid nodule.  Lungs: Clear to auscultation bilaterally with normal respiratory effort. CV: Nondisplaced PMI.  Heart irregular S1/S2, no S3/S4, 2/6 HSM at apex.  No peripheral edema.  Abdomen: Soft, nontender, no hepatosplenomegaly, no distention.  Neurologic: Alert and oriented x 3.  Psych: Normal affect. Extremities: No clubbing or cyanosis.   TELEMETRY: Reviewed telemetry pt in atrial fibrillation in 90s  ASSESSMENT AND PLAN: 79 yo with history of depression/anxiety admitted with atrial fibrillation/RVR and acute systolic CHF. 1. Atrial fibrillation: HR in 90s-100s on diltiazem gtt this morning.  EF was 25-30% on  echo and he has volume overload CHF.  Cannot rule out tachy-medicated cardiomyopathy (not sure how long he has been in atrial fibrillation).  We need to get him out of atrial fibrillation.  However, prior to cardioversion, will need to do LHC (so we do not have to hold anticoagulation after cardioversion).  I am going to start amiodarone to hold him in NSR once he is cardioverted. - D/c diltiazem gtt - Continue digoxin, add Toprol XL 50 mg daily. - Start amiodarone 400 mg bid.   - Continue heparin gtt, will need to make decision on anticoagulation but likely will be warfarin for the time being.  - TEE-guided DCCV after cath so that anticoagulation will not have to be interrupted after cardioversion.  2. CHF: Acute systolic, EF 25-30%.  Diffuse hypokinesis with some regionality in the basal inferior and inferoseptal walls. 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  He remains volume overloaded but is diuresing well.  - Continue Lasix 40 mg IV bid and replace K.  - Add lisinopril 2.5 mg bid.  - Adding Toprol XL as above for rate control.  - Will need LHC to rule out significant CAD => will do this tomorrow.   Kassity Woodson 12/09/2012   

## 2012-12-10 ENCOUNTER — Encounter (HOSPITAL_COMMUNITY)
Admission: EM | Disposition: A | Discharge status: Skilled Nursing Facility | Payer: Self-pay | Source: Home / Self Care | Attending: Cardiology

## 2012-12-10 DIAGNOSIS — I2510 Atherosclerotic heart disease of native coronary artery without angina pectoris: Secondary | ICD-10-CM

## 2012-12-10 HISTORY — PX: LEFT HEART CATHETERIZATION WITH CORONARY ANGIOGRAM: SHX5451

## 2012-12-10 LAB — BASIC METABOLIC PANEL
BUN: 21 mg/dL (ref 6–23)
Chloride: 98 mEq/L (ref 96–112)
GFR calc Af Amer: 73 mL/min — ABNORMAL LOW (ref >90)
GFR calc non Af Amer: 63 mL/min — ABNORMAL LOW (ref >90)
Potassium: 3.3 mEq/L — ABNORMAL LOW (ref 3.5–5.1)
Sodium: 137 mEq/L (ref 135–145)

## 2012-12-10 LAB — CBC
HCT: 33.4 % — ABNORMAL LOW (ref 39.0–52.0)
MCHC: 32.3 g/dL (ref 30.0–36.0)
Platelets: 178 10*3/uL (ref 150–400)
RDW: 16.2 % — ABNORMAL HIGH (ref 11.5–15.5)
WBC: 11.7 10*3/uL — ABNORMAL HIGH (ref 4.0–10.5)

## 2012-12-10 LAB — PROTIME-INR
INR: 1.46 (ref 0.00–1.49)
Prothrombin Time: 17.3 seconds — ABNORMAL HIGH (ref 11.6–15.2)

## 2012-12-10 SURGERY — LEFT HEART CATHETERIZATION WITH CORONARY ANGIOGRAM
Anesthesia: LOCAL

## 2012-12-10 MED ORDER — MIDAZOLAM HCL 2 MG/2ML IJ SOLN
INTRAMUSCULAR | Status: AC
  Filled 2012-12-10: qty 2

## 2012-12-10 MED ORDER — FENTANYL CITRATE 0.05 MG/ML IJ SOLN
INTRAMUSCULAR | Status: AC
  Filled 2012-12-10: qty 2

## 2012-12-10 MED ORDER — LIDOCAINE HCL (PF) 1 % IJ SOLN
INTRAMUSCULAR | Status: AC
  Filled 2012-12-10: qty 30

## 2012-12-10 MED ORDER — SODIUM CHLORIDE 0.9 % IJ SOLN
3.0000 mL | Freq: Two times a day (BID) | INTRAMUSCULAR | Status: DC
  Administered 2012-12-10 – 2012-12-20 (×19): 3 mL via INTRAVENOUS

## 2012-12-10 MED ORDER — HEPARIN (PORCINE) IN NACL 100-0.45 UNIT/ML-% IJ SOLN
1900.0000 [IU]/h | INTRAMUSCULAR | Status: AC
  Administered 2012-12-10 – 2012-12-13 (×6): 1900 [IU]/h via INTRAVENOUS
  Filled 2012-12-10 (×7): qty 250

## 2012-12-10 MED ORDER — FUROSEMIDE 40 MG PO TABS
40.0000 mg | ORAL_TABLET | Freq: Every day | ORAL | Status: DC
  Administered 2012-12-11: 40 mg via ORAL
  Filled 2012-12-10 (×2): qty 1

## 2012-12-10 MED ORDER — HEPARIN SODIUM (PORCINE) 1000 UNIT/ML IJ SOLN
INTRAMUSCULAR | Status: AC
  Filled 2012-12-10: qty 1

## 2012-12-10 MED ORDER — SODIUM CHLORIDE 0.9 % IV SOLN: 250.0000 mL | INTRAVENOUS | Status: DC

## 2012-12-10 MED ORDER — HYDROCORTISONE 1 % EX CREA: 1.0000 "application " | TOPICAL_CREAM | Freq: Three times a day (TID) | CUTANEOUS | Status: DC | PRN

## 2012-12-10 MED ORDER — SODIUM CHLORIDE 0.9 % IV SOLN
INTRAVENOUS | Status: DC
  Administered 2012-12-11: via INTRAVENOUS

## 2012-12-10 MED ORDER — ONDANSETRON HCL 4 MG/2ML IJ SOLN: 4.0000 mg | Freq: Four times a day (QID) | INTRAMUSCULAR | Status: DC | PRN

## 2012-12-10 MED ORDER — VERAPAMIL HCL 2.5 MG/ML IV SOLN
INTRAVENOUS | Status: AC
  Filled 2012-12-10: qty 2

## 2012-12-10 MED ORDER — ACETAMINOPHEN 325 MG PO TABS
650.0000 mg | ORAL_TABLET | ORAL | Status: DC | PRN
  Administered 2012-12-12 – 2012-12-18 (×2): 650 mg via ORAL
  Filled 2012-12-10: qty 2

## 2012-12-10 MED ORDER — HEPARIN (PORCINE) IN NACL 2-0.9 UNIT/ML-% IJ SOLN
INTRAMUSCULAR | Status: AC
  Filled 2012-12-10: qty 1000

## 2012-12-10 MED ORDER — SODIUM CHLORIDE 0.9 % IJ SOLN: 3.0000 mL | INTRAMUSCULAR | Status: DC | PRN

## 2012-12-10 MED ORDER — POTASSIUM CHLORIDE CRYS ER 20 MEQ PO TBCR
40.0000 meq | EXTENDED_RELEASE_TABLET | Freq: Once | ORAL | Status: AC
  Administered 2012-12-10: 40 meq via ORAL
  Filled 2012-12-10: qty 2

## 2012-12-10 NOTE — Progress Notes (Signed)
1400  Back to  Room from cardiac cath via stretcher with nurse and monitor. Keptcomfortable  In bed . Arm R   Elevated on a pillow

## 2012-12-10 NOTE — H&P (View-Only) (Signed)
Patient ID: [name], male   DOB: 09-28-1924, 79 y.o.   MRN: [id number]    SUBJECTIVE: Breathing better with diuresis, HR controlled but still in atrial fibrillation.  Echo yesterday with EF 25-30%, global hypokinesis but some regionality in the basal inferior and inferoseptal walls. No chest pain.  Has felt "bad" weeks.   [name] amiodarone  400 mg Oral BID  . antiseptic oral rinse  15 mL Mouth Rinse BID  . aspirin EC  81 mg Oral Daily  . buPROPion  75 mg Oral BID WC  . digoxin  0.125 mg Oral Daily  . furosemide  40 mg Intravenous BID  . lisinopril  2.5 mg Oral BID  . metoprolol succinate  50 mg Oral Daily  . potassium chloride  40 mEq Oral Once  . potassium chloride  40 mEq Oral Once  . sodium chloride  3 mL Intravenous Q12H  heparin gtt  Filed Vitals:   12/08/12 1424 12/08/12 1937 12/09/12 0533 12/09/12 0845  BP: 120/64 123/68 119/54 116/58  Pulse: 67 100 80 86  Temp: 97.8 F (36.6 C) 97.9 F (36.6 C) 97.7 F (36.5 C)   TempSrc: Oral Oral Oral   Resp: 19 20 20 20   Height:      Weight:   195 lb 12.3 oz (88.8 kg)   SpO2: 96% 95% 97% 96%    Intake/Output Summary (Last 24 hours) at 12/09/12 0915 Last data filed at 12/09/12 0855  Gross per 24 hour  Intake  755.5 ml  Output   2725 ml  Net -1969.5 ml    LABS: Basic Metabolic Panel:  Recent Labs  40/98/11 1710 12/07/12 0500 12/08/12 0550 12/09/12 0658  NA 141 142 141 138  K 4.8 3.6 2.8* 3.0*  CL 106 105 104 101  CO2 24 22 28 29   GLUCOSE 125* 104* 101* 103*  BUN 41* 38* 30* 21  CREATININE 1.38* 1.31 1.08 0.91  CALCIUM 9.4 8.7 8.3* 8.1*  MG 2.5 2.2  --   --    Liver Function Tests:  Recent Labs  12/06/12 1710  AST 82*  ALT 89*  ALKPHOS 85  BILITOT 0.8  PROT 7.1  ALBUMIN 3.5   No results found for this basename: LIPASE, AMYLASE,  in the last 72 hours CBC:  Recent Labs  12/06/12 1710  12/08/12 0550 12/09/12 0658  WBC 14.0*  < > 9.5 10.0  NEUTROABS 11.7*  --   --   --   HGB 10.9*  < > 10.0* 10.1*   HCT 34.4*  < > 32.1* 31.4*  MCV 88.7  < > 86.8 86.7  PLT 204  < > 180 168  < > = values in this interval not displayed. Cardiac Enzymes: No results found for this basename: CKTOTAL, CKMB, CKMBINDEX, TROPONINI,  in the last 72 hours BNP: No components found with this basename: POCBNP,  D-Dimer: No results found for this basename: DDIMER,  in the last 72 hours Hemoglobin A1C: No results found for this basename: HGBA1C,  in the last 72 hours Fasting Lipid Panel: No results found for this basename: CHOL, HDL, LDLCALC, TRIG, CHOLHDL, LDLDIRECT,  in the last 72 hours Thyroid Function Tests:  Recent Labs  12/06/12 1710  TSH 3.000   Anemia Panel: No results found for this basename: VITAMINB12, FOLATE, FERRITIN, TIBC, IRON, RETICCTPCT,  in the last 72 hours  RADIOLOGY: Dg Chest Portable 1 View  12/06/2012  *RADIOLOGY REPORT*  Clinical Data: Shortness of breath.  PORTABLE CHEST -  1 VIEW  Comparison: 01/20/2012.  Findings: The heart is enlarged.  There is tortuosity and calcification of the thoracic aorta.  There is vascular congestion, asymmetric pulmonary edema and bilateral pleural effusions consistent with CHF.  IMPRESSION: Congestive heart failure.   Original Report Authenticated By: Rudie Meyer, M.D.     PHYSICAL EXAM General: NAD Neck: JVP 10, no thyromegaly or thyroid nodule.  Lungs: Clear to auscultation bilaterally with normal respiratory effort. CV: Nondisplaced PMI.  Heart irregular S1/S2, no S3/S4, 2/6 HSM at apex.  No peripheral edema.  Abdomen: Soft, nontender, no hepatosplenomegaly, no distention.  Neurologic: Alert and oriented x 3.  Psych: Normal affect. Extremities: No clubbing or cyanosis.   TELEMETRY: Reviewed telemetry pt in atrial fibrillation in 90s  ASSESSMENT AND PLAN: 79 yo with history of depression/anxiety admitted with atrial fibrillation/RVR and acute systolic CHF. 1. Atrial fibrillation: HR in 90s-100s on diltiazem gtt this morning.  EF was 25-30% on  echo and he has volume overload CHF.  Cannot rule out tachy-medicated cardiomyopathy (not sure how long he has been in atrial fibrillation).  We need to get him out of atrial fibrillation.  However, prior to cardioversion, will need to do LHC (so we do not have to hold anticoagulation after cardioversion).  I am going to start amiodarone to hold him in NSR once he is cardioverted. - D/c diltiazem gtt - Continue digoxin, add Toprol XL 50 mg daily. - Start amiodarone 400 mg bid.   - Continue heparin gtt, will need to make decision on anticoagulation but likely will be warfarin for the time being.  - TEE-guided DCCV after cath so that anticoagulation will not have to be interrupted after cardioversion.  2. CHF: Acute systolic, EF 25-30%.  Diffuse hypokinesis with some regionality in the basal inferior and inferoseptal walls. 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  He remains volume overloaded but is diuresing well.  - Continue Lasix 40 mg IV bid and replace K.  - Add lisinopril 2.5 mg bid.  - Adding Toprol XL as above for rate control.  - Will need LHC to rule out significant CAD => will do this tomorrow.   [name] 12/09/2012

## 2012-12-10 NOTE — Interval H&P Note (Signed)
History and Physical Interval Note:  12/10/2012 11:15 AM  [name]  has presented today for surgery, with the diagnosis of Cardiomyopathy  The various methods of treatment have been discussed with the patient and family. After consideration of risks, benefits and other options for treatment, the patient has consented to  Procedure(s): LEFT HEART CATHETERIZATION WITH CORONARY ANGIOGRAM (N/A) as a surgical intervention .  The patient's history has been reviewed, patient examined, no change in status, stable for surgery.  I have reviewed the patient's chart and labs.  Questions were answered to the patient's satisfaction.     Dalton [employer]

## 2012-12-10 NOTE — CV Procedure (Signed)
   Cardiac Catheterization Procedure Note  Name: [name] MRN: [id number] DOB: [birth year]  Procedure: Left Heart Cath, Selective Coronary Angiography  Indication: New-onset left heart failure.    Procedural Details: Positive Allen's test.  The right wrist was prepped, draped, and anesthetized with 1% lidocaine. Using the modified Seldinger technique, a 5 French sheath was introduced into the right radial artery. 3 mg of verapamil was administered through the sheath, weight-based unfractionated heparin was administered intravenously. 3DRC was used to engage the RCA, JL3.5 to engage the LCA. Catheter exchanges were performed over an exchange length guidewire. There were no immediate procedural complications. A TR band was used for radial hemostasis at the completion of the procedure.  The patient was transferred to the post catheterization recovery area for further monitoring.  Procedural Findings: Hemodynamics: AO 94/55 LV 99/14  Coronary angiography: Coronary dominance: right  Left mainstem: Short, no significant disease.   Left anterior descending (LAD): Long approximately 60% stenosis in the proximal LAD.   Left circumflex (LCx): Large, dominant vessel.  Provides left PDA.  60% proximal PDA stenosis.   Right coronary artery (RCA): Small, nondominant RCA with no significant disease.   Left ventriculography: Not done (echo yesterday).    Final Conclusions:  Moderate, long, around 60% stenosis in the proximal LAD.  Given length this could certainly lead to ischemia but I do not think that it is the cause of his cardiomyopathy.  I think that the cardiomyopathy is most likely to be tachycardia-mediated based on today's findings.  Will plan TEE-guided DCCV tomorrow as her remains in atrial fibrillation.  Can transition to po Lasix.   [name] 12/10/2012, 12:03 PM

## 2012-12-10 NOTE — Progress Notes (Signed)
ANTICOAGULATION CONSULT NOTE - Follow Up Consult  Pharmacy Consult for Heparin Indication: atrial fibrillation  Heparin Dosing Weight: 90 kg  Labs:  Recent Labs  12/07/12 0500 12/08/12 0550 12/08/12 1649 12/09/12 0658  HGB 10.3* 10.0*  --  10.1*  HCT 32.5* 32.1*  --  31.4*  PLT 191 180  --  168  HEPARINUNFRC  --  0.27* 0.36 0.19*  CREATININE 1.31 1.08  --  0.91   Lab Results  Component Value Date   INR 1.46 12/10/2012   INR 1.51* 12/09/2012    Assessment:  79 y/o male started on Heparin infusion for new onset of atrial fibrillation.  Heparin level now therapeutic (0.5) on IV heparin rate of 1950 units/hr prior to cath this morning.  No bleeding complications noted .  For TEE guided DCCV 4/23 with anticipated long-term oral anticoagulation post-cardioversion.  Baseline INR is 17.8 sec/1.51  Goal of Therapy:  Heparin level 0.3-0.7 units/ml   Plan:  Restart IV Heparin infusion at 1900 units/hr. (1800 tonight) Daily Heparin level, INR, CBC.  Monitor for bleeding complications   Sheppard Coil PharmD., BCPS Clinical Pharmacist Pager [phone] 12/10/2012 1:23 PM

## 2012-12-10 NOTE — Plan of Care (Signed)
Problem: Phase I Progression Outcomes Goal: Up in chair, BRP Outcome: Progressing Patient with limited mobility of LLE due to chronic knee problems.  Remains in bed for majority of shift.

## 2012-12-10 NOTE — Plan of Care (Signed)
Problem: Phase I Progression Outcomes Goal: Distal pulses equal to baseline Outcome: Not Met (add Reason) Radial pulses are +2, distal pulses are +1

## 2012-12-11 ENCOUNTER — Encounter (HOSPITAL_COMMUNITY): Payer: Self-pay | Admitting: *Deleted

## 2012-12-11 ENCOUNTER — Encounter (HOSPITAL_COMMUNITY)
Admission: EM | Disposition: A | Discharge status: Skilled Nursing Facility | Payer: Self-pay | Source: Home / Self Care | Attending: Cardiology

## 2012-12-11 ENCOUNTER — Encounter (HOSPITAL_COMMUNITY): Payer: Self-pay | Admitting: Anesthesiology

## 2012-12-11 DIAGNOSIS — I2510 Atherosclerotic heart disease of native coronary artery without angina pectoris: Secondary | ICD-10-CM

## 2012-12-11 DIAGNOSIS — I059 Rheumatic mitral valve disease, unspecified: Secondary | ICD-10-CM

## 2012-12-11 HISTORY — PX: TEE WITHOUT CARDIOVERSION: SHX5443

## 2012-12-11 LAB — CBC
MCH: 28.1 pg (ref 26.0–34.0)
MCHC: 32.8 g/dL (ref 30.0–36.0)
MCV: 85.5 fL (ref 78.0–100.0)
Platelets: 178 10*3/uL (ref 150–400)

## 2012-12-11 LAB — BASIC METABOLIC PANEL
BUN: 22 mg/dL (ref 6–23)
Calcium: 8.5 mg/dL (ref 8.4–10.5)
Creatinine, Ser: 0.98 mg/dL (ref 0.50–1.35)
GFR calc non Af Amer: 72 mL/min — ABNORMAL LOW (ref >90)
Glucose, Bld: 103 mg/dL — ABNORMAL HIGH (ref 70–99)

## 2012-12-11 LAB — HEPARIN LEVEL (UNFRACTIONATED): Heparin Unfractionated: 0.32 IU/mL (ref 0.30–0.70)

## 2012-12-11 LAB — PROTIME-INR: Prothrombin Time: 17.8 seconds — ABNORMAL HIGH (ref 11.6–15.2)

## 2012-12-11 SURGERY — ECHOCARDIOGRAM, TRANSESOPHAGEAL
Anesthesia: Moderate Sedation

## 2012-12-11 MED ORDER — WARFARIN VIDEO
Freq: Once | Status: AC
  Administered 2012-12-11: 08:00:00

## 2012-12-11 MED ORDER — METOPROLOL SUCCINATE ER 50 MG PO TB24
50.0000 mg | ORAL_TABLET | Freq: Two times a day (BID) | ORAL | Status: DC
  Administered 2012-12-11: 50 mg via ORAL
  Filled 2012-12-11 (×4): qty 1

## 2012-12-11 MED ORDER — FENTANYL CITRATE 0.05 MG/ML IJ SOLN
INTRAMUSCULAR | Status: AC
  Filled 2012-12-11: qty 2

## 2012-12-11 MED ORDER — WARFARIN SODIUM 7.5 MG PO TABS
7.5000 mg | ORAL_TABLET | Freq: Once | ORAL | Status: DC
  Filled 2012-12-11: qty 1

## 2012-12-11 MED ORDER — MIDAZOLAM HCL 5 MG/ML IJ SOLN
INTRAMUSCULAR | Status: AC
  Filled 2012-12-11: qty 1

## 2012-12-11 MED ORDER — BUTAMBEN-TETRACAINE-BENZOCAINE 2-2-14 % EX AERO
INHALATION_SPRAY | CUTANEOUS | Status: DC | PRN
  Administered 2012-12-11: 2 via TOPICAL

## 2012-12-11 MED ORDER — WARFARIN - PHARMACIST DOSING INPATIENT
Freq: Every day | Status: DC
  Administered 2012-12-12: 18:00:00

## 2012-12-11 MED ORDER — COUMADIN BOOK
Freq: Once | Status: AC
  Administered 2012-12-11: 08:00:00
  Filled 2012-12-11: qty 1

## 2012-12-11 MED ORDER — MIDAZOLAM HCL 10 MG/2ML IJ SOLN
INTRAMUSCULAR | Status: DC | PRN
  Administered 2012-12-11: 1 mg via INTRAVENOUS
  Administered 2012-12-11: 2 mg via INTRAVENOUS

## 2012-12-11 MED ORDER — FENTANYL CITRATE 0.05 MG/ML IJ SOLN
INTRAMUSCULAR | Status: DC | PRN
  Administered 2012-12-11 (×3): 25 ug via INTRAVENOUS

## 2012-12-11 MED ORDER — WARFARIN SODIUM 4 MG PO TABS
4.0000 mg | ORAL_TABLET | Freq: Once | ORAL | Status: AC
  Administered 2012-12-11: 4 mg via ORAL
  Filled 2012-12-11: qty 1

## 2012-12-11 MED ORDER — ATORVASTATIN CALCIUM 20 MG PO TABS
20.0000 mg | ORAL_TABLET | Freq: Every day | ORAL | Status: DC
  Administered 2012-12-11 – 2012-12-19 (×9): 20 mg via ORAL
  Filled 2012-12-11 (×10): qty 1

## 2012-12-11 NOTE — H&P (View-Only) (Signed)
Patient ID: [name], male   DOB: 06/04/1925, 79 y.o.   MRN: [id number]    SUBJECTIVE: Breathing overall better with diuresis, still in atrial fibrillation with HR 120s this morning.   . amiodarone  400 mg Oral BID  . antiseptic oral rinse  15 mL Mouth Rinse BID  . aspirin EC  81 mg Oral Daily  . atorvastatin  20 mg Oral q1800  . buPROPion  75 mg Oral BID WC  . digoxin  0.125 mg Oral Daily  . furosemide  40 mg Oral Daily  . lisinopril  2.5 mg Oral BID  . metoprolol succinate  50 mg Oral Daily  . sodium chloride  3 mL Intravenous Q12H  . sodium chloride  3 mL Intravenous Q12H  heparin gtt warfarin  Filed Vitals:   12/10/12 1900 12/10/12 2150 12/11/12 0016 12/11/12 0541  BP: 100/55 100/60 109/73 115/95  Pulse:  115 120 114  Temp:  98 F (36.7 C) 98.1 F (36.7 C) 97.8 F (36.6 C)  TempSrc:  Oral Oral Oral  Resp:  18 20 20  Height:      Weight:    191 lb 2.2 oz (86.7 kg)  SpO2:  99% 98% 95%    Intake/Output Summary (Last 24 hours) at 12/11/12 0710 Last data filed at 12/10/12 2129  Gross per 24 hour  Intake    120 ml  Output    900 ml  Net   -780 ml    LABS: Basic Metabolic Panel:  Recent Labs  12/10/12 0620 12/11/12 0154  NA 137 136  K 3.3* 3.6  CL 98 100  CO2 31 28  GLUCOSE 113* 103*  BUN 21 22  CREATININE 1.04 0.98  CALCIUM 8.3* 8.5   Liver Function Tests: No results found for this basename: AST, ALT, ALKPHOS, BILITOT, PROT, ALBUMIN,  in the last 72 hours No results found for this basename: LIPASE, AMYLASE,  in the last 72 hours CBC:  Recent Labs  12/10/12 0625 12/11/12 0154  WBC 11.7* 10.8*  HGB 10.8* 11.0*  HCT 33.4* 33.5*  MCV 86.3 85.5  PLT 178 178   Cardiac Enzymes: No results found for this basename: CKTOTAL, CKMB, CKMBINDEX, TROPONINI,  in the last 72 hours BNP: No components found with this basename: POCBNP,  D-Dimer: No results found for this basename: DDIMER,  in the last 72 hours Hemoglobin A1C: No results found for this  basename: HGBA1C,  in the last 72 hours Fasting Lipid Panel: No results found for this basename: CHOL, HDL, LDLCALC, TRIG, CHOLHDL, LDLDIRECT,  in the last 72 hours Thyroid Function Tests: No results found for this basename: TSH, T4TOTAL, FREET3, T3FREE, THYROIDAB,  in the last 72 hours Anemia Panel: No results found for this basename: VITAMINB12, FOLATE, FERRITIN, TIBC, IRON, RETICCTPCT,  in the last 72 hours  RADIOLOGY: Dg Chest Portable 1 View  12/06/2012  *RADIOLOGY REPORT*  Clinical Data: Shortness of breath.  PORTABLE CHEST - 1 VIEW  Comparison: 01/20/2012.  Findings: The heart is enlarged.  There is tortuosity and calcification of the thoracic aorta.  There is vascular congestion, asymmetric pulmonary edema and bilateral pleural effusions consistent with CHF.  IMPRESSION: Congestive heart failure.   Original Report Authenticated By: P. Gallerani, M.D.     PHYSICAL EXAM General: NAD Neck: JVP 8-9, no thyromegaly or thyroid nodule.  Lungs: Clear to auscultation bilaterally with normal respiratory effort. CV: Nondisplaced PMI.  Heart irregular S1/S2, no S3/S4, 2/6 HSM at apex.  Trace ankle edema.    Abdomen: Soft, nontender, no hepatosplenomegaly, no distention.  Neurologic: Alert and oriented x 3.  Psych: Normal affect. Extremities: No clubbing or cyanosis.   TELEMETRY: Reviewed telemetry pt in atrial fibrillation in 90s  ASSESSMENT AND PLAN: 79 yo with history of depression/anxiety admitted with atrial fibrillation/RVR and acute systolic CHF. 1. Atrial fibrillation: HR 120s this morning.  EF was 25-30% on echo. 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 - Continue digoxin and Toprol XL 50 mg daily. - Continue amiodarone 400 mg bid.   - Heparin gtt/warfarin overlap.  - TEE-guided DCCV today. 2. CHF: Acute systolic, EF 25-30%.  Diffuse hypokinesis with some regionality in the basal inferior and inferoseptal walls.  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  LHC yesterday showed a long 60% proximal LAD stenosis but I do not think this caused his cardiomyopathy.  LVEDP only 14 on cath, JVP mildly elevated. - Lasix is now po. - Continue lisinopril 2.5 mg bid and Toprol XL.  3. CAD: Moderate CAD with 60% long proximal LAD stenosis.  He will be on statin and coumadin.   [name] 12/11/2012   

## 2012-12-11 NOTE — Progress Notes (Addendum)
ANTICOAGULATION CONSULT NOTE - Follow Up Consult  Pharmacy Consult for heparin  Indication: atrial fibrillation  Allergies  Allergen Reactions  . Darifenacin Hydrobromide     REACTION: constipation  . Fluoxetine Hcl     REACTION: Very Depressed while taking    Patient Measurements: Height: 6' (182.9 cm) Weight: 194 lb 14.2 oz (88.4 kg) IBW/kg (Calculated) : 77.6 Heparin Dosing Weight:   Vital Signs: Temp: 98.1 F (36.7 C) (04/23 0016) Temp src: Oral (04/23 0016) BP: 109/73 mmHg (04/23 0016) Pulse Rate: 120 (04/23 0016)  Labs:  Recent Labs  12/08/12 0550  12/09/12 1610 12/09/12 2050 12/10/12 0620 12/10/12 0625 12/11/12 0154  HGB 10.0*  --  10.1*  --   --  10.8* 11.0*  HCT 32.1*  --  31.4*  --   --  33.4* 33.5*  PLT 180  --  168  --   --  178 178  LABPROT  --   --   --  17.8*  --  17.3* 17.8*  INR  --   --   --  1.51*  --  1.46 1.51*  HEPARINUNFRC 0.27*  < > 0.19* 0.41  --  0.56 0.32  CREATININE 1.08  --  0.91  --  1.04  --   --   < > = values in this interval not displayed.  Estimated Creatinine Clearance: 56 ml/min (by C-G formula based on Cr of 1.04).   Medications:    Assessment: Heparin level is therapeutic. No bleeding noted.  Goal of Therapy:   Heparin level 0.3-0.7    Plan:  Continue at current rate   Janice Coffin 12/11/2012,2:32 AM  Addum:  Coumadin per Rx.  Will start today with 4 mg.( Baseline INR elevated)  Check daily PT/INR.

## 2012-12-11 NOTE — Progress Notes (Signed)
Pt currently refusing transport for scheduled X-Ray. Will pass on to oncoming RN and encourage participation at a later time. Will continue to monitor.

## 2012-12-11 NOTE — Interval H&P Note (Signed)
History and Physical Interval Note:  12/11/2012 1:25 PM  [name]  has presented today for surgery, with the diagnosis of atrial fibrillation  The various methods of treatment have been discussed with the patient and family. After consideration of risks, benefits and other options for treatment, the patient has consented to  Procedure(s) with comments: TRANSESOPHAGEAL ECHOCARDIOGRAM (TEE) (N/A) - ja/bev. CARDIOVERSION (N/A) as a surgical intervention .  The patient's history has been reviewed, patient examined, no change in status, stable for surgery.  I have reviewed the patient's chart and labs.  Questions were answered to the patient's satisfaction.     Elyn Aquas.

## 2012-12-11 NOTE — CV Procedure (Signed)
    Transesophageal Echocardiogram Note  [name] [id number] Jun 03, 1925  Procedure: Transesophageal Echocardiogram Indications: Atrial fib  Procedure Details Consent: Obtained Time Out: Verified patient identification, verified procedure, site/side was marked, verified correct patient position, special equipment/implants available, Radiology Safety Procedures followed,  medications/allergies/relevent history reviewed, required imaging and test results available.  Performed  Medications: Fentanyl: 75 mcg IV Versed: 3 mg IV  Left Ventrical:  Severe LV dysfunction, EF 25%  Mitral Valve: moderate MR  Aortic Valve: normal LV  Tricuspid Valve: normal  Pulmonic Valve: trace PI  Left Atrium/ Left atrial appendage: there is a shadow in the left atrial appendage that my represent thrombus.  I could not definitively say that his appendage is free of thrombus - the CV was cancelled.  He has spontaneous contrast comig from the LAA  Atrial septum: normal.  Aorta: no vis   Complications: No apparent complications Patient did tolerate procedure well.  There is a shadow in the LAA that may represent thrombus.  I would recommend 4 weeks of anticoagulation , more HR control and cardioversion after 4 more weeks anticoagulation.   Vesta Mixer, Montez Hageman., MD, [facility] 12/11/2012, 1:48 PM

## 2012-12-11 NOTE — Progress Notes (Signed)
Patient ID: [name], male   DOB: 01-02-1925, 79 y.o.   MRN: [id number]    SUBJECTIVE: Breathing overall better with diuresis, still in atrial fibrillation with HR 120s this morning.   [name] amiodarone  400 mg Oral BID  . antiseptic oral rinse  15 mL Mouth Rinse BID  . aspirin EC  81 mg Oral Daily  . atorvastatin  20 mg Oral q1800  . buPROPion  75 mg Oral BID WC  . digoxin  0.125 mg Oral Daily  . furosemide  40 mg Oral Daily  . lisinopril  2.5 mg Oral BID  . metoprolol succinate  50 mg Oral Daily  . sodium chloride  3 mL Intravenous Q12H  . sodium chloride  3 mL Intravenous Q12H  heparin gtt warfarin  Filed Vitals:   12/10/12 1900 12/10/12 2150 12/11/12 0016 12/11/12 0541  BP: 100/55 100/60 109/73 115/95  Pulse:  115 120 114  Temp:  98 F (36.7 C) 98.1 F (36.7 C) 97.8 F (36.6 C)  TempSrc:  Oral Oral Oral  Resp:  18 20 20   Height:      Weight:    191 lb 2.2 oz (86.7 kg)  SpO2:  99% 98% 95%    Intake/Output Summary (Last 24 hours) at 12/11/12 0710 Last data filed at 12/10/12 2129  Gross per 24 hour  Intake    120 ml  Output    900 ml  Net   -780 ml    LABS: Basic Metabolic Panel:  Recent Labs  14/78/29 0620 12/11/12 0154  NA 137 136  K 3.3* 3.6  CL 98 100  CO2 31 28  GLUCOSE 113* 103*  BUN 21 22  CREATININE 1.04 0.98  CALCIUM 8.3* 8.5   Liver Function Tests: No results found for this basename: AST, ALT, ALKPHOS, BILITOT, PROT, ALBUMIN,  in the last 72 hours No results found for this basename: LIPASE, AMYLASE,  in the last 72 hours CBC:  Recent Labs  12/10/12 0625 12/11/12 0154  WBC 11.7* 10.8*  HGB 10.8* 11.0*  HCT 33.4* 33.5*  MCV 86.3 85.5  PLT 178 178   Cardiac Enzymes: No results found for this basename: CKTOTAL, CKMB, CKMBINDEX, TROPONINI,  in the last 72 hours BNP: No components found with this basename: POCBNP,  D-Dimer: No results found for this basename: DDIMER,  in the last 72 hours Hemoglobin A1C: No results found for this  basename: HGBA1C,  in the last 72 hours Fasting Lipid Panel: No results found for this basename: CHOL, HDL, LDLCALC, TRIG, CHOLHDL, LDLDIRECT,  in the last 72 hours Thyroid Function Tests: No results found for this basename: TSH, T4TOTAL, FREET3, T3FREE, THYROIDAB,  in the last 72 hours Anemia Panel: No results found for this basename: VITAMINB12, FOLATE, FERRITIN, TIBC, IRON, RETICCTPCT,  in the last 72 hours  RADIOLOGY: Dg Chest Portable 1 View  12/06/2012  *RADIOLOGY REPORT*  Clinical Data: Shortness of breath.  PORTABLE CHEST - 1 VIEW  Comparison: 01/20/2012.  Findings: The heart is enlarged.  There is tortuosity and calcification of the thoracic aorta.  There is vascular congestion, asymmetric pulmonary edema and bilateral pleural effusions consistent with CHF.  IMPRESSION: Congestive heart failure.   Original Report Authenticated By: Rudie Meyer, M.D.     PHYSICAL EXAM General: NAD Neck: JVP 8-9, no thyromegaly or thyroid nodule.  Lungs: Clear to auscultation bilaterally with normal respiratory effort. CV: Nondisplaced PMI.  Heart irregular S1/S2, no S3/S4, 2/6 HSM at apex.  Trace ankle edema.  Abdomen: Soft, nontender, no hepatosplenomegaly, no distention.  Neurologic: Alert and oriented x 3.  Psych: Normal affect. Extremities: No clubbing or cyanosis.   TELEMETRY: Reviewed telemetry pt in atrial fibrillation in 90s  ASSESSMENT AND PLAN: 79 yo with history of depression/anxiety admitted with atrial fibrillation/RVR and acute systolic CHF. 1. Atrial fibrillation: HR 120s this morning.  EF was 25-30% on echo. 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 - Continue digoxin and Toprol XL 50 mg daily. - Continue amiodarone 400 mg bid.   - Heparin gtt/warfarin overlap.  - TEE-guided DCCV today. 2. CHF: Acute systolic, EF 25-30%.  Diffuse hypokinesis with some regionality in the basal inferior and inferoseptal walls.  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  LHC yesterday showed a long 60% proximal LAD stenosis but I do not think this caused his cardiomyopathy.  LVEDP only 14 on cath, JVP mildly elevated. - Lasix is now po. - Continue lisinopril 2.5 mg bid and Toprol XL.  3. CAD: Moderate CAD with 60% long proximal LAD stenosis.  He will be on statin and coumadin.   [name] 12/11/2012

## 2012-12-11 NOTE — Anesthesia Preprocedure Evaluation (Deleted)
Anesthesia Evaluation  Patient identified by MRN, date of birth, ID band Patient awake    Reviewed: Allergy & Precautions, H&P , NPO status , Patient's Chart, lab work & pertinent test results  History of Anesthesia Complications Negative for: history of anesthetic complications  Airway Mallampati: II TM Distance: >3 FB Neck ROM: Full    Dental  (+) Dental Advisory Given   Pulmonary neg pulmonary ROS,          Cardiovascular hypertension, Pt. on medications and Pt. on home beta blockers Rhythm:Irregular Rate:Abnormal     Neuro/Psych negative neurological ROS     GI/Hepatic negative GI ROS, Neg liver ROS,   Endo/Other  negative endocrine ROS  Renal/GU negative Renal ROS     Musculoskeletal   Abdominal   Peds  Hematology negative hematology ROS (+)   Anesthesia Other Findings   Reproductive/Obstetrics                           Anesthesia Physical Anesthesia Plan  ASA: III  Anesthesia Plan: General   Post-op Pain Management:    Induction: Intravenous  Airway Management Planned: Mask  Additional Equipment:   Intra-op Plan:   Post-operative Plan:   Informed Consent:   Plan Discussed with:   Anesthesia Plan Comments:         Anesthesia Quick Evaluation

## 2012-12-11 NOTE — Progress Notes (Signed)
*  PRELIMINARY RESULTS* Echocardiogram Echocardiogram Transesophageal has been performed.  Jeryl Columbia 12/11/2012, 3:10 PM

## 2012-12-12 ENCOUNTER — Encounter (HOSPITAL_COMMUNITY): Payer: Self-pay | Admitting: Cardiovascular Disease

## 2012-12-12 LAB — HEPARIN LEVEL (UNFRACTIONATED): Heparin Unfractionated: 0.48 IU/mL (ref 0.30–0.70)

## 2012-12-12 LAB — BASIC METABOLIC PANEL
Calcium: 9.2 mg/dL (ref 8.4–10.5)
GFR calc Af Amer: 71 mL/min — ABNORMAL LOW (ref >90)
GFR calc non Af Amer: 61 mL/min — ABNORMAL LOW (ref >90)
Sodium: 140 mEq/L (ref 135–145)

## 2012-12-12 LAB — PROTIME-INR
INR: 1.4 (ref 0.00–1.49)
Prothrombin Time: 16.8 seconds — ABNORMAL HIGH (ref 11.6–15.2)

## 2012-12-12 LAB — CBC
MCHC: 32.3 g/dL (ref 30.0–36.0)
Platelets: 217 10*3/uL (ref 150–400)
RDW: 16.1 % — ABNORMAL HIGH (ref 11.5–15.5)

## 2012-12-12 LAB — CLOSTRIDIUM DIFFICILE BY PCR: Toxigenic C. Difficile by PCR: NEGATIVE

## 2012-12-12 MED ORDER — COUMADIN BOOK
1.0000 | Freq: Once | Status: AC
  Administered 2012-12-12: 1
  Filled 2012-12-12: qty 1

## 2012-12-12 MED ORDER — METOPROLOL SUCCINATE ER 50 MG PO TB24
75.0000 mg | ORAL_TABLET | Freq: Two times a day (BID) | ORAL | Status: DC
  Administered 2012-12-12 (×2): 75 mg via ORAL
  Filled 2012-12-12 (×5): qty 1

## 2012-12-12 MED ORDER — WARFARIN VIDEO
1.0000 | Freq: Once | Status: AC
  Administered 2012-12-13: 1

## 2012-12-12 MED ORDER — WARFARIN SODIUM 5 MG PO TABS
5.0000 mg | ORAL_TABLET | Freq: Once | ORAL | Status: AC
  Administered 2012-12-12: 5 mg via ORAL
  Filled 2012-12-12: qty 1

## 2012-12-12 MED ORDER — FUROSEMIDE 10 MG/ML IJ SOLN
40.0000 mg | Freq: Two times a day (BID) | INTRAMUSCULAR | Status: DC
  Administered 2012-12-12 (×2): 40 mg via INTRAVENOUS
  Filled 2012-12-12 (×3): qty 4

## 2012-12-12 MED ORDER — POTASSIUM CHLORIDE CRYS ER 20 MEQ PO TBCR
40.0000 meq | EXTENDED_RELEASE_TABLET | Freq: Once | ORAL | Status: AC
  Administered 2012-12-12: 40 meq via ORAL
  Filled 2012-12-12: qty 2

## 2012-12-12 NOTE — Progress Notes (Signed)
ANTICOAGULATION CONSULT NOTE - Follow Up Consult   Pharmacy Consult for Coumadin with Heparin bridging  Indication: atrial fibrillation w/RVR , possible LAA thrombus  Dosing Weight: 86 kg  Labs:  Recent Labs  12/10/12 0625 12/11/12 0154 12/12/12 0600  HGB 10.8* 11.0* 11.3*  HCT 33.4* 33.5* 35.0*  PLT 178 178 217  INR 1.46 1.51* 1.40  HEPARINUNFRC 0.56 0.32 0.48  CREATININE  --  0.98 1.06   Lab Results  Component Value Date   INR 1.40 12/12/2012   INR 1.51* 12/11/2012   INR 1.46 12/10/2012   Estimated Creatinine Clearance: 54.9 ml/min (by C-G formula based on Cr of 1.06).  Medications:  Scheduled:  . amiodarone  400 mg Oral BID  . antiseptic oral rinse  15 mL Mouth Rinse BID  . aspirin EC  81 mg Oral Daily  . atorvastatin  20 mg Oral q1800  . buPROPion  75 mg Oral BID WC  . digoxin  0.125 mg Oral Daily  . furosemide  40 mg Intravenous BID  . lisinopril  2.5 mg Oral BID  . metoprolol succinate  75 mg Oral BID  . sodium chloride  3 mL Intravenous Q12H  . sodium chloride  3 mL Intravenous Q12H  . [COMPLETED] warfarin  4 mg Oral ONCE-1800  . Warfarin - Pharmacist Dosing Inpatient   Does not apply q1800   Infusions:  . Heparin 1,900 Units/hr (12/12/12 0444)   Assessment:  79 y/o male started on Coumadin with Heparin bridging for new onset of atrial fibrillation.  Additionally, possible LAA thrombus found yesterday during TEE.  Heparin level within therapeutic range.  INR SUBtherapeutic 1.4.  No bleeding complications noted   Goal of Therapy:  INR 2-3  Heparin level 0.3-0.7 units/ml   Plan:   Continue Heparin infusion at the current rate.   Coumadin 5 mg today.  Coumadin discharge education started.  Daily Heparin level, INR, CBC, Monitor for bleeding complications   Laurena Bering, Pharm.D.  12/12/2012,8:38 AM

## 2012-12-12 NOTE — Evaluation (Signed)
Occupational Therapy Evaluation Patient Details Name: [name] MRN: [id number] DOB: 03-16-1925 Today's Date: 12/12/2012 Time: 6213-0865 OT Time Calculation (min): 20 min  OT Assessment / Plan / Recommendation Clinical Impression  This 79 yo male admitted with shortness of breath at rest along with generalized malaise for past 2 weeks.  Wife felt he had significant worsening today with labored breathing, found to have A-fib with RVR--s/p cardioversion 12/11/12, presents to acute OT with problems below. WIll benefit from acute OT with follow up OT at SNF.    OT Assessment  Patient needs continued OT Services    Follow Up Recommendations  SNF    Barriers to Discharge Decreased caregiver support    Equipment Recommendations  None recommended by OT       Frequency  Min 2X/week    Precautions / Restrictions Precautions Precautions: Fall Precaution Comments: pt wears a brace on L knee 2/2 needing TKA, however per pt he is too old for surgery.   Restrictions Weight Bearing Restrictions: No       ADL  Eating/Feeding: Simulated;Independent Where Assessed - Eating/Feeding: Edge of bed Grooming: Simulated;Set up;Supervision/safety Where Assessed - Grooming: Unsupported sitting Upper Body Bathing: Simulated;Set up;Supervision/safety Where Assessed - Upper Body Bathing: Unsupported sitting Lower Body Bathing: Simulated;Maximal assistance Where Assessed - Lower Body Bathing: Supported sit to stand Upper Body Dressing: Simulated;Minimal assistance Where Assessed - Upper Body Dressing: Unsupported sitting Lower Body Dressing: Performed;+1 Total assistance (could not get socks off or on) Where Assessed - Lower Body Dressing: Supported sit to stand Equipment Used: Gait belt Transfers/Ambulation Related to ADLs: Mod A sit<>stand    OT Diagnosis: Generalized weakness  OT Problem List: Decreased activity tolerance;Impaired balance (sitting and/or standing);Decreased cognition OT  Treatment Interventions: Self-care/ADL training;Therapeutic activities;Balance training;Patient/family education;DME and/or AE instruction   OT Goals Acute Rehab OT Goals OT Goal Formulation: With patient Time For Goal Achievement: 12/26/12 Potential to Achieve Goals: Good ADL Goals Pt Will Perform Grooming: with set-up;with supervision;Unsupported;Standing at sink ADL Goal: Grooming - Progress: Goal set today Pt Will Perform Upper Body Bathing: with set-up;with supervision;Sitting at sink;Standing at sink;Unsupported ADL Goal: Upper Body Bathing - Progress: Goal set today Pt Will Perform Lower Body Bathing: with min assist;Sitting at sink;Standing at sink;Unsupported ADL Goal: Lower Body Bathing - Progress: Goal set today Pt Will Transfer to Toilet: with supervision;Ambulation;with DME;3-in-1 ADL Goal: Toilet Transfer - Progress: Goal set today Pt Will Perform Toileting - Clothing Manipulation: with supervision;Standing ADL Goal: Toileting - Clothing Manipulation - Progress: Goal set today Pt Will Perform Toileting - Hygiene: with supervision;Sit to stand from 3-in-1/toilet ADL Goal: Toileting - Hygiene - Progress: Goal set today Miscellaneous OT Goals Miscellaneous OT Goal #1: Pt will be Mod I in and OOB for BADLs OT Goal: Miscellaneous Goal #1 - Progress: Goal set today  Visit Information  Last OT Received On: 12/12/12 Assistance Needed: +2    Subjective Data  Subjective: "I am not going to let them fool me again, they did not say I had to sit up in the recliner for 2 hours, they just left me" --about PT----he actually only set up in the recliner less than an hour.   Prior Functioning     Home Living Lives With: Spouse Available Help at Discharge: Available 24 hours/day;Family Type of Home: House Home Access: Stairs to enter [employer] of Steps: 3 Entrance Stairs-Rails: None Home Layout: One level Home Adaptive Equipment: Straight cane Prior Function Level  of Independence: Needs assistance Needs Assistance: Meal Prep;Light  Housekeeping Meal Prep: Maximal Light Housekeeping: Total Able to Take Stairs?: Yes Vocation: Retired Musician: HOH Dominant Hand: Right            Cognition  Cognition Arousal/Alertness: Awake/alert Behavior During Therapy: Agitated Overall Cognitive Status:  (Decreased ablity for amount of time lapsed)    Extremity/Trunk Assessment Right Upper Extremity Assessment RUE ROM/Strength/Tone: Within functional levels Left Upper Extremity Assessment LUE ROM/Strength/Tone: Within functional levels     Mobility Bed Mobility Bed Mobility: Supine to Sit;Sitting - Scoot to [place] of Bed;Sit to Supine;Scooting to [facility] Supine to Sit: 3: Mod assist;With rails;HOB elevated Sitting - Scoot to Edge of Bed: 4: Min assist;With rail Sit to Supine: 3: Mod assist;With rail;HOB flat Scooting to HOB: 3: Mod assist;With rail (and bed in trendelenbug) Details for Bed Mobility Assistance: cues for UE use, sequencing.  pt requires increased time to complete.   Transfers Transfers: Sit to Stand;Stand to Sit Sit to Stand: 3: Mod assist;With upper extremity assist;From bed Stand to Sit: 3: Mod assist;With upper extremity assist;To bed Details for Transfer Assistance: cues for UE use, sequencing, safety.             End of Session OT - End of Session Equipment Utilized During Treatment: Gait belt Activity Tolerance: Patient limited by fatigue Patient left: in bed;with call bell/phone within reach       Evette Georges [phone] 12/12/2012, 11:16 AM

## 2012-12-12 NOTE — Progress Notes (Signed)
Patient ID: [name], male   DOB: 1924-12-17, 79 y.o.   MRN: [id number]   SUBJECTIVE: Possible thrombus on TEE yesterday, so patient was not cardioverted.  HR in 90s-100s this morning (improved).  Breathing ok. Has not been out of bed much.    [name] amiodarone  400 mg Oral BID  . antiseptic oral rinse  15 mL Mouth Rinse BID  . aspirin EC  81 mg Oral Daily  . atorvastatin  20 mg Oral q1800  . buPROPion  75 mg Oral BID WC  . digoxin  0.125 mg Oral Daily  . furosemide  40 mg Intravenous BID  . lisinopril  2.5 mg Oral BID  . metoprolol succinate  75 mg Oral BID  . potassium chloride  40 mEq Oral Once  . sodium chloride  3 mL Intravenous Q12H  . sodium chloride  3 mL Intravenous Q12H  . Warfarin - Pharmacist Dosing Inpatient   Does not apply q1800  heparin gtt  Filed Vitals:   12/11/12 1500 12/11/12 1600 12/11/12 2158 12/12/12 0428  BP: 114/73 92/63 105/54 109/64  Pulse:   121 116  Temp:   97.6 F (36.4 C) 97.5 F (36.4 C)  TempSrc:   Oral Oral  Resp:   18 16  Height:      Weight:    189 lb 3.2 oz (85.821 kg)  SpO2:   99% 95%    Intake/Output Summary (Last 24 hours) at 12/12/12 0709 Last data filed at 12/12/12 0645  Gross per 24 hour  Intake 1054.25 ml  Output   1300 ml  Net -245.75 ml    LABS: Basic Metabolic Panel:  Recent Labs  11/91/47 0154 12/12/12 0600  NA 136 140  K 3.6 3.9  CL 100 103  CO2 28 28  GLUCOSE 103* 122*  BUN 22 28*  CREATININE 0.98 1.06  CALCIUM 8.5 9.2   Liver Function Tests: No results found for this basename: AST, ALT, ALKPHOS, BILITOT, PROT, ALBUMIN,  in the last 72 hours No results found for this basename: LIPASE, AMYLASE,  in the last 72 hours CBC:  Recent Labs  12/11/12 0154 12/12/12 0600  WBC 10.8* 11.5*  HGB 11.0* 11.3*  HCT 33.5* 35.0*  MCV 85.5 85.8  PLT 178 217   Cardiac Enzymes: No results found for this basename: CKTOTAL, CKMB, CKMBINDEX, TROPONINI,  in the last 72 hours BNP: No components found with this  basename: POCBNP,  D-Dimer: No results found for this basename: DDIMER,  in the last 72 hours Hemoglobin A1C: No results found for this basename: HGBA1C,  in the last 72 hours Fasting Lipid Panel: No results found for this basename: CHOL, HDL, LDLCALC, TRIG, CHOLHDL, LDLDIRECT,  in the last 72 hours Thyroid Function Tests: No results found for this basename: TSH, T4TOTAL, FREET3, T3FREE, THYROIDAB,  in the last 72 hours Anemia Panel: No results found for this basename: VITAMINB12, FOLATE, FERRITIN, TIBC, IRON, RETICCTPCT,  in the last 72 hours  RADIOLOGY: Dg Chest Portable 1 View  12/06/2012  *RADIOLOGY REPORT*  Clinical Data: Shortness of breath.  PORTABLE CHEST - 1 VIEW  Comparison: 01/20/2012.  Findings: The heart is enlarged.  There is tortuosity and calcification of the thoracic aorta.  There is vascular congestion, asymmetric pulmonary edema and bilateral pleural effusions consistent with CHF.  IMPRESSION: Congestive heart failure.   Original Report Authenticated By: Rudie Meyer, M.D.     PHYSICAL EXAM General: NAD Neck: JVP 9-10, no thyromegaly or thyroid nodule.  Lungs:  Clear to auscultation bilaterally with normal respiratory effort. CV: Nondisplaced PMI.  Heart irregular S1/S2, no S3/S4, 2/6 HSM at apex.  Trace ankle edema.  Abdomen: Soft, nontender, no hepatosplenomegaly, no distention.  Neurologic: Alert and oriented x 3.  Psych: Normal affect. Extremities: No clubbing or cyanosis.   TELEMETRY: Reviewed telemetry pt in atrial fibrillation in 90s  ASSESSMENT AND PLAN: 79 yo with history of depression/anxiety admitted with atrial fibrillation/RVR and acute systolic CHF. 1. Atrial fibrillation: HR 90s-100s this morning.  EF was 25-30% on echo. 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  Unable to cardiovert due to possible LAA thrombus on TEE.  - Continue digoxin and amiodarone. - Increase Toprol XL to 75 mg bid.    - Heparin gtt/warfarin overlap => will continue heparin until INR > 2 given possible thrombus.  - DCCV after 4 wks therapeutic INR.  2. CHF: Acute systolic, EF 25-30%.  Diffuse hypokinesis with some regionality in the basal inferior and inferoseptal walls. 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  LHC showed a long 60% proximal LAD stenosis but I do not think this caused his cardiomyopathy.  JVP elevated this morning. - Will give another day of IV Lasix today.  - Continue lisinopril 2.5 mg bid and Toprol XL.  3. CAD: Moderate CAD with 60% long proximal LAD stenosis.  He will be on statin and coumadin.   [name] 12/12/2012

## 2012-12-12 NOTE — Evaluation (Signed)
Physical Therapy Evaluation Patient Details Name: [name] MRN: [id number] DOB: [birth year] Today's Date: 12/12/2012 Time: 4098-1191 PT Time Calculation (min): 18 min  PT Assessment / Plan / Recommendation Clinical Impression  pt presents with A-fib, SOB, HF.  pt very debilitated and weak.  ? how well pt moved at baseline.  At this point pt unable to return home with elderly wife and will need SNF at D/C.      PT Assessment  Patient needs continued PT services    Follow Up Recommendations  SNF    Does the patient have the potential to tolerate intense rehabilitation      Barriers to Discharge None      Equipment Recommendations  Rolling walker with 5" wheels    Recommendations for Other Services     Frequency Min 3X/week    Precautions / Restrictions Precautions Precautions: Fall Precaution Comments: pt wears a brace on L knee 2/2 needing TKA, however per pt he is too old for surgery.   Restrictions Weight Bearing Restrictions: No   Pertinent Vitals/Pain Indicates chronic pain in L knee.        Mobility  Bed Mobility Bed Mobility: Supine to Sit;Sitting - Scoot to Edge of Bed Supine to Sit: 4: Min assist;With rails;HOB elevated Sitting - Scoot to Edge of Bed: 4: Min assist Details for Bed Mobility Assistance: cues for UE use, sequencing.  pt requires increased time to complete.   Transfers Transfers: Sit to Stand;Stand to Sit;Stand Pivot Transfers Sit to Stand: 3: Mod assist;With upper extremity assist;From bed Stand to Sit: 3: Mod assist;With upper extremity assist;To chair/3-in-1;With armrests Stand Pivot Transfers: 1: +2 Total assist Stand Pivot Transfers: Patient Percentage: 50% Details for Transfer Assistance: cues for UE use, sequencing, safety.  pt incontinent of bowel just prior to standing.   Ambulation/Gait Ambulation/Gait Assistance: Not tested (comment) Stairs: No Wheelchair Mobility Wheelchair Mobility: No    Exercises     PT Diagnosis:  Difficulty walking;Generalized weakness  PT Problem List: Decreased strength;Decreased activity tolerance;Decreased balance;Decreased mobility;Decreased knowledge of use of DME PT Treatment Interventions: DME instruction;Gait training;Stair training;Functional mobility training;Therapeutic activities;Therapeutic exercise;Balance training;Patient/family education   PT Goals Acute Rehab PT Goals PT Goal Formulation: With patient Time For Goal Achievement: 12/26/12 Potential to Achieve Goals: Good Pt will go Supine/Side to Sit: with modified independence PT Goal: Supine/Side to Sit - Progress: Goal set today Pt will go Sit to Supine/Side: with modified independence PT Goal: Sit to Supine/Side - Progress: Goal set today Pt will go Sit to Stand: with supervision PT Goal: Sit to Stand - Progress: Goal set today Pt will go Stand to Sit: with supervision PT Goal: Stand to Sit - Progress: Goal set today Pt will Ambulate: >150 feet;with supervision;with rolling walker PT Goal: Ambulate - Progress: Goal set today Pt will Go Up / Down Stairs: 3-5 stairs;with min assist;with least restrictive assistive device PT Goal: Up/Down Stairs - Progress: Goal set today  Visit Information  Last PT Received On: 12/12/12 Assistance Needed: +2    Subjective Data  Subjective: I'm just so tired.   Patient Stated Goal: Sleep.   Prior Functioning  Home Living Lives With: Spouse Available Help at Discharge: Available 24 hours/day;Family Type of Home: House Home Access: Stairs to enter [employer] of Steps: 3 Entrance Stairs-Rails: None Home Layout: One level Home Adaptive Equipment: Straight cane Prior Function Level of Independence: Needs assistance Needs Assistance: Meal Prep;Light Housekeeping Meal Prep: Maximal Light Housekeeping: Total Able to Take Stairs?: Yes Vocation:  Retired Musician: Surveyor, mining Arousal/Alertness: Awake/alert Behavior  During Therapy: WFL for tasks assessed/performed Overall Cognitive Status: Within Functional Limits for tasks assessed    Extremity/Trunk Assessment Right Lower Extremity Assessment RLE ROM/Strength/Tone: Deficits RLE ROM/Strength/Tone Deficits: Generally weak, 4-/5.   RLE Sensation: WFL - Light Touch Left Lower Extremity Assessment LLE ROM/Strength/Tone: Deficits LLE ROM/Strength/Tone Deficits: Generally weak and painful at knee, 4-/5.   LLE Sensation: WFL - Light Touch Trunk Assessment Trunk Assessment: Kyphotic   Balance Balance Balance Assessed: Yes Static Standing Balance Static Standing - Balance Support: Bilateral upper extremity supported;During functional activity Static Standing - Level of Assistance: 4: Min assist Static Standing - Comment/# of Minutes: MinA to maintain standing with total A needed for peri hygiene.    End of Session PT - End of Session Equipment Utilized During Treatment: Gait belt;Oxygen Activity Tolerance: Patient limited by fatigue Patient left: in chair;with call bell/phone within reach Nurse Communication: Mobility status  GP     Sunny Schlein, [place] [phone] 12/12/2012, 9:38 AM

## 2012-12-13 DIAGNOSIS — N179 Acute kidney failure, unspecified: Secondary | ICD-10-CM

## 2012-12-13 LAB — PROTIME-INR: Prothrombin Time: 18.5 seconds — ABNORMAL HIGH (ref 11.6–15.2)

## 2012-12-13 LAB — CBC
Hemoglobin: 11.5 g/dL — ABNORMAL LOW (ref 13.0–17.0)
MCH: 28.2 pg (ref 26.0–34.0)
MCHC: 33.1 g/dL (ref 30.0–36.0)
RDW: 16.1 % — ABNORMAL HIGH (ref 11.5–15.5)

## 2012-12-13 LAB — GLUCOSE, CAPILLARY: Glucose-Capillary: 175 mg/dL — ABNORMAL HIGH (ref 70–99)

## 2012-12-13 LAB — BASIC METABOLIC PANEL
CO2: 26 mEq/L (ref 19–32)
Calcium: 9.1 mg/dL (ref 8.4–10.5)
Creatinine, Ser: 2.29 mg/dL — ABNORMAL HIGH (ref 0.50–1.35)

## 2012-12-13 LAB — HEPARIN LEVEL (UNFRACTIONATED): Heparin Unfractionated: 0.65 IU/mL (ref 0.30–0.70)

## 2012-12-13 MED ORDER — SODIUM CHLORIDE 0.9 % IV BOLUS (SEPSIS)
250.0000 mL | Freq: Once | INTRAVENOUS | Status: AC
  Administered 2012-12-13: 250 mL via INTRAVENOUS

## 2012-12-13 MED ORDER — METOPROLOL SUCCINATE ER 50 MG PO TB24
75.0000 mg | ORAL_TABLET | Freq: Two times a day (BID) | ORAL | Status: DC
  Administered 2012-12-13 – 2012-12-16 (×7): 75 mg via ORAL
  Filled 2012-12-13 (×11): qty 1

## 2012-12-13 MED ORDER — WARFARIN SODIUM 5 MG PO TABS
5.0000 mg | ORAL_TABLET | Freq: Once | ORAL | Status: DC
  Filled 2012-12-13: qty 1

## 2012-12-13 MED ORDER — APIXABAN 2.5 MG PO TABS
2.5000 mg | ORAL_TABLET | Freq: Two times a day (BID) | ORAL | Status: DC
  Administered 2012-12-13 – 2012-12-15 (×6): 2.5 mg via ORAL
  Filled 2012-12-13 (×8): qty 1

## 2012-12-13 MED ORDER — DIGOXIN 125 MCG PO TABS
0.1250 mg | ORAL_TABLET | ORAL | Status: DC
  Administered 2012-12-14 – 2012-12-16 (×2): 0.125 mg via ORAL
  Filled 2012-12-13 (×2): qty 1

## 2012-12-13 NOTE — Clinical Documentation Improvement (Signed)
RENAL FAILURE DOCUMENTATION CLARIFICATION QUERY  CLINICAL DOCUMENTATION QUERIES ARE NOT PART OF THE PERMANENT MEDICAL RECORD  Please update your documentation within the medical record to reflect your response to this query.                                                                                     12/13/12  Dr. Gala Romney,  In a better effort to capture your patient's severity of illness, reflect appropriate length of stay and utilization of resources, a review of the patient medical record has revealed the following information:  "Acute Renal Failure in the setting of overdiuresis" documented in progress note 12/13/12.  S/P Left Heart Catheterization and Coronary Angiography on 12/10/12.  BUN/Cr/GFR this admission     (white male) 41/1.38/45 38/1.31/48 30/1.08/60 21/0.91/75 21/1.04/23 22/0.98/72 28/1.06/61 46/2.29/24   Based on your clinical judgment, please provide specificity regarding the documented acute renal failure:   Acute renal failure with cortical necrosis   Acute renal failure with medullary (papillary) necrosis   Acute renal failure with tubular necrosis   Other type of acute renal failure   Other cause    Unable to Clinically Determine   Note: Please include documentation of underlying condition(s) contributing to or causing renal failure.     In responding to this query please exercise your independent judgment.     The fact that a query is asked, does not imply that any particular answer is desired or expected.    Reviewed: Please see my email message. ArF due to overdiuresis is discussed in note. thanks  Thank You,  Jerral Ralph RN BSN CCDS Certified Clinical Documentation Specialist: Cell   [phone]  Health Information Management [place]   TO RESPOND TO THE THIS QUERY, FOLLOW THE INSTRUCTIONS BELOW:  1. If needed, update documentation for the patient's encounter via the notes activity.  2. Access this  query again and click edit on the In [employer].  3. After updating, or not, click F2 to complete all highlighted (required) fields concerning your review. Select "additional documentation in the medical record" OR "no additional documentation provided".  4. Click Sign note button.  5. The deficiency will fall out of your In Basket *Please let us know if you are not able to complete this workflow by phone or e-mail (listed below).

## 2012-12-13 NOTE — Progress Notes (Signed)
Patient's BP is 88/47 and is due for metoprolol. PA notified. Ok to hold med. No complaints and no signs or symptoms of distress. With continue to monitor patient for further changes in condition.

## 2012-12-13 NOTE — Progress Notes (Addendum)
ANTICOAGULATION CONSULT NOTE - Follow Up Consult   Pharmacy Consult for Coumadin with Heparin bridging  Indication: atrial fibrillation w/RVR , possible LAA thrombus  Dosing Weight: 86 kg  Labs:  Recent Labs  12/10/12 0625 12/11/12 0154 12/12/12 0600  HGB 10.8* 11.0* 11.3*  HCT 33.4* 33.5* 35.0*  PLT 178 178 217  INR 1.46 1.51* 1.40  HEPARINUNFRC 0.56 0.32 0.48  CREATININE  --  0.98 1.06   Lab Results  Component Value Date   INR 1.59* 12/13/2012   INR 1.40 12/12/2012   INR 1.51* 12/11/2012   Estimated Creatinine Clearance: 25.4 ml/min (by C-G formula based on Cr of 2.29).  Medications:  Scheduled:  . amiodarone  400 mg Oral BID  . antiseptic oral rinse  15 mL Mouth Rinse BID  . aspirin EC  81 mg Oral Daily  . atorvastatin  20 mg Oral q1800  . buPROPion  75 mg Oral BID WC  . digoxin  0.125 mg Oral Daily  . furosemide  40 mg Intravenous BID  . lisinopril  2.5 mg Oral BID  . metoprolol succinate  75 mg Oral BID  . sodium chloride  3 mL Intravenous Q12H  . sodium chloride  3 mL Intravenous Q12H  . [COMPLETED] warfarin  4 mg Oral ONCE-1800  . Warfarin - Pharmacist Dosing Inpatient   Does not apply q1800   Infusions:  . Heparin 1,900 Units/hr (12/12/12 0444)   Assessment:  79 y/o male started on Coumadin with Heparin bridging for NEW onset of atrial fibrillation.  Additionally, possible LAA thrombus found yesterday during TEE.  Heparin level within therapeutic range.  INR SUBtherapeutic trend slowly up  No bleeding complications noted   Goal of Therapy:  INR 2-3  Heparin level 0.3-0.7 units/ml   Plan:   Continue Heparin infusion at the current rate.   Coumadin 5 mg today.  Coumadin discharge education started.  Daily Heparin level, INR, CBC, Monitor for bleeding complications    Thank you for allowing pharmacy to be a part of this patients care team.  Lovenia Kim Pharm.D., BCPS Clinical Pharmacist 12/13/2012 9:08 AM Pager: [phone] Phone:  [phone]   12:17 PM Given risk of bleeding, and clots, in setting of afib and deteriorating renal function, decision made to maintain patient on apixaban 2.5 mg PO bid.  Follow CBC and SCr.  Discontinue heparin when apixaban dose given.  Helvi Royals Christine Virginia Crews

## 2012-12-13 NOTE — Progress Notes (Signed)
Patient ID: [name], male   DOB: 09/30/1924, 79 y.o.   MRN: [id number]   SUBJECTIVE:  79 yo with history of depression/anxiety admitted with atrial fibrillation/RVR and acute systolic CHF thought to be related to tachycardic CM.   12/10/12 LHC Moderate, long, around 60% stenosis in the proximal LAD  Possible thrombus on TEE 12/11/12, so patient was not cardioverted.  Remains on Toprol, amiodarone and digoxin.   Feels weak. No orthopnea or PND. Cr doubled to 2.2 today     . amiodarone  400 mg Oral BID  . antiseptic oral rinse  15 mL Mouth Rinse BID  . aspirin EC  81 mg Oral Daily  . atorvastatin  20 mg Oral q1800  . buPROPion  75 mg Oral BID WC  . digoxin  0.125 mg Oral Daily  . furosemide  40 mg Intravenous BID  . lisinopril  2.5 mg Oral BID  . metoprolol succinate  75 mg Oral BID  . sodium chloride  3 mL Intravenous Q12H  . sodium chloride  3 mL Intravenous Q12H  . warfarin  1 each Does not apply Once  . Warfarin - Pharmacist Dosing Inpatient   Does not apply q1800  heparin gtt  Filed Vitals:   12/12/12 1439 12/12/12 2041 12/12/12 2058 12/13/12 0657  BP: 106/73 110/68 100/63 104/70  Pulse: 63 98 78 83  Temp: 97.9 F (36.6 C)  97.5 F (36.4 C) 97.6 F (36.4 C)  TempSrc: Oral  Oral Oral  Resp: 20  18 20   Height:      Weight:    189 lb (85.73 kg)  SpO2: 95%  97% 94%    Intake/Output Summary (Last 24 hours) at 12/13/12 0711 Last data filed at 12/12/12 2106  Gross per 24 hour  Intake 814.22 ml  Output    200 ml  Net 614.22 ml    LABS: Basic Metabolic Panel:  Recent Labs  40/98/11 0600 12/13/12 0430  NA 140 142  K 3.9 3.8  CL 103 105  CO2 28 26  GLUCOSE 122* 100*  BUN 28* 46*  CREATININE 1.06 2.29*  CALCIUM 9.2 9.1   Liver Function Tests: No results found for this basename: AST, ALT, ALKPHOS, BILITOT, PROT, ALBUMIN,  in the last 72 hours No results found for this basename: LIPASE, AMYLASE,  in the last 72 hours CBC:  Recent Labs  12/12/12 0600  12/13/12 0430  WBC 11.5* 10.9*  HGB 11.3* 11.5*  HCT 35.0* 34.7*  MCV 85.8 85.0  PLT 217 214    RADIOLOGY: Dg Chest Portable 1 View  12/06/2012  *RADIOLOGY REPORT*  Clinical Data: Shortness of breath.  PORTABLE CHEST - 1 VIEW  Comparison: 01/20/2012.  Findings: The heart is enlarged.  There is tortuosity and calcification of the thoracic aorta.  There is vascular congestion, asymmetric pulmonary edema and bilateral pleural effusions consistent with CHF.  IMPRESSION: Congestive heart failure.   Original Report Authenticated By: Rudie Meyer, M.D.     PHYSICAL EXAM General: Elderly frail. Chronically ill appearing Neck: supple no VP  no thyromegaly or thyroid nodule.  Lungs: Clear to auscultation bilaterally with normal respiratory effort. CV: Nondisplaced PMI.  Heart irregular S1/S2, no S3/S4, 2/6 HSM at apex.   Abdomen: Soft, nontender, no hepatosplenomegaly, mild distention.  Neurologic: Alert and oriented x 3.  Psych: Normal affect. Extremities: No clubbing or cyanosis. No edema. Statis dermatitis with dry skin  TELEMETRY: Reviewed telemetry pt in atrial fibrillation in 80-90s  ASSESSMENT: 1. Atrial fibrillation  2. Acute Systolic Heart Failure - EF 25-30% - suspect tach induce 3. CAD 4. Acute Renal Failure 5. Physical deconditioning, severe  PLAN/DISCUSSION:  He remains very tenuous with acute renal failure in the setting of overdiuresis. Will stop lasix, lisinopril and potassium. BP is low so will give small bolus of IVF. (250cc NS)  Rate control achieved. Will switch coumadin to apixaban.   I discussed with him the fact that he is quite sick and that there is a chance he may not survive this hospitalization but that we would do all we could to turn things around.   If he gets worse would consider Hospice consult.  Daniel Bensimhon,MD 11:46 AM

## 2012-12-13 NOTE — Clinical Social Work Placement (Addendum)
    Clinical Social Work Department CLINICAL SOCIAL WORK PLACEMENT NOTE 12/22/2012  Patient:  [name], [name]  Account Number:  [id number] Admit date:  12/06/2012  Clinical Social Worker:  Lovette Cliche, LCSWA  Date/time:  12/13/2012 05:44 PM  Clinical Social Work is seeking post-discharge placement for this patient at the following level of care:   ASSISTED LIVING/REST HOME   (*CSW will update this form in Epic as items are completed)   12/13/2012  Patient/family provided with [facility] Department of Clinical Social Work's list of facilities offering this level of care within the geographic area requested by the patient (or if unable, by the patient's family).  12/13/2012  Patient/family informed of their freedom to choose among providers that offer the needed level of care, that participate in Medicare, Medicaid or managed care program needed by the patient, have an available bed and are willing to accept the patient.  12/13/2012  Patient/family informed of [facility]' ownership interest in [facility], as well as of the fact that they are under no obligation to receive care at this facility.  PASARR submitted to EDS on 12/13/2012 PASARR number received from EDS on 12/13/2012  FL2 transmitted to all facilities in geographic area requested by pt/family on  12/13/2012 FL2 transmitted to all facilities within larger geographic area on   Patient informed that his/her managed care company has contracts with or will negotiate with  certain facilities, including the following:   [facility]     Patient/family informed of bed offers received:  12/19/2012 Patient chooses bed at [facility] Physician recommends and patient chooses bed at    Patient to be transferred to [facility] on  12/20/2012 Patient to be transferred to facility by Ambulance  [name])  The following physician request were entered in Epic:   Additional  Comments: 5/214  Patient, wife and daughter are pleased with d/c arrangements to SNF today. They are hoping it will be short term SNF.  Notified facility and pt's nurse of dc. She will give report.  No further CSW needs identified.  CSW signing of.  [name]. [name], LCSWA  [phone]

## 2012-12-14 LAB — BASIC METABOLIC PANEL
BUN: 58 mg/dL — ABNORMAL HIGH (ref 6–23)
GFR calc Af Amer: 21 mL/min — ABNORMAL LOW (ref >90)
GFR calc non Af Amer: 18 mL/min — ABNORMAL LOW (ref >90)
Potassium: 3.8 mEq/L (ref 3.5–5.1)
Sodium: 143 mEq/L (ref 135–145)

## 2012-12-14 LAB — CBC
HCT: 34.5 % — ABNORMAL LOW (ref 39.0–52.0)
MCHC: 32.5 g/dL (ref 30.0–36.0)
Platelets: 226 10*3/uL (ref 150–400)
RDW: 16.2 % — ABNORMAL HIGH (ref 11.5–15.5)

## 2012-12-14 LAB — GLUCOSE, CAPILLARY: Glucose-Capillary: 111 mg/dL — ABNORMAL HIGH (ref 70–99)

## 2012-12-14 MED ORDER — CLONAZEPAM 0.5 MG PO TABS
1.0000 mg | ORAL_TABLET | Freq: Every evening | ORAL | Status: DC | PRN
  Administered 2012-12-16: 1 mg via ORAL
  Filled 2012-12-14: qty 2

## 2012-12-14 MED ORDER — CLONAZEPAM 0.5 MG PO TABS
0.5000 mg | ORAL_TABLET | Freq: Every day | ORAL | Status: DC
  Administered 2012-12-14 – 2012-12-20 (×7): 0.5 mg via ORAL
  Filled 2012-12-14 (×5): qty 1

## 2012-12-14 MED ORDER — SODIUM CHLORIDE 0.9 % IV SOLN
INTRAVENOUS | Status: AC
  Administered 2012-12-14: 10:00:00 via INTRAVENOUS

## 2012-12-14 NOTE — Progress Notes (Signed)
Patient ID: [name], male   DOB: 23-Mar-1925, 79 y.o.   MRN: [id number] "I think I'm going to die!"  SUBJECTIVE:  79 yo with history of depression/anxiety admitted with atrial fibrillation/RVR and acute systolic CHF thought to be related to tachycardic CM.   12/10/12 LHC Moderate, long, around 60% stenosis in the proximal LAD  Possible thrombus on TEE 12/11/12, so patient was not cardioverted.  Remains on Toprol, amiodarone and digoxin.   Feels weak. No orthopnea or PND. Cr doubled to 2.2 today     . amiodarone  400 mg Oral BID  . antiseptic oral rinse  15 mL Mouth Rinse BID  . apixaban  2.5 mg Oral BID  . aspirin EC  81 mg Oral Daily  . atorvastatin  20 mg Oral q1800  . buPROPion  75 mg Oral BID WC  . digoxin  0.125 mg Oral Q48H  . metoprolol succinate  75 mg Oral BID  . sodium chloride  3 mL Intravenous Q12H  . sodium chloride  3 mL Intravenous Q12H  heparin gtt  Filed Vitals:   12/13/12 1456 12/13/12 2100 12/14/12 0529 12/14/12 0826  BP: 92/67 100/65 116/69 120/74  Pulse: 72  85 100  Temp: 97.4 F (36.3 C)  97.5 F (36.4 C)   TempSrc: Oral  Oral   Resp: 20 24 18    Height:      Weight:   186 lb 11.7 oz (84.7 kg)   SpO2: 92% 98% 95%     Intake/Output Summary (Last 24 hours) at 12/14/12 0931 Last data filed at 12/14/12 0809  Gross per 24 hour  Intake 1740.03 ml  Output      0 ml  Net 1740.03 ml    LABS: Basic Metabolic Panel:  Recent Labs  40/98/11 0430 12/14/12 0630  NA 142 143  K 3.8 3.8  CL 105 105  CO2 26 28  GLUCOSE 100* 115*  BUN 46* 58*  CREATININE 2.29* 2.94*  CALCIUM 9.1 9.2   Liver Function Tests: No results found for this basename: AST, ALT, ALKPHOS, BILITOT, PROT, ALBUMIN,  in the last 72 hours No results found for this basename: LIPASE, AMYLASE,  in the last 72 hours CBC:  Recent Labs  12/13/12 0430 12/14/12 0630  WBC 10.9* 12.4*  HGB 11.5* 11.2*  HCT 34.7* 34.5*  MCV 85.0 86.3  PLT 214 226    RADIOLOGY: Dg Chest  Portable 1 View  12/06/2012  *RADIOLOGY REPORT*  Clinical Data: Shortness of breath.  PORTABLE CHEST - 1 VIEW  Comparison: 01/20/2012.  Findings: The heart is enlarged.  There is tortuosity and calcification of the thoracic aorta.  There is vascular congestion, asymmetric pulmonary edema and bilateral pleural effusions consistent with CHF.  IMPRESSION: Congestive heart failure.   Original Report Authenticated By: Rudie Meyer, M.D.     PHYSICAL EXAM General: Elderly frail. Chronically ill appearing Neck: supple no JVD  no thyromegaly or thyroid nodule.  Lungs: Clear to auscultation bilaterally with normal respiratory effort. CV: Nondisplaced PMI.  Heart irregular S1/S2, no S3/S4, 2/6 HSM at apex.   Abdomen: Soft, nontender, no hepatosplenomegaly, mild distention.  Neurologic: Alert and oriented x 3.  Psych: Normal affect. Extremities: No clubbing or cyanosis. No edema. Statis dermatitis with dry skin  TELEMETRY: Reviewed telemetry pt in atrial fibrillation in 80-90s  ASSESSMENT: 1. Atrial fibrillation 2. Acute Systolic Heart Failure - EF 25-30% - suspect tach induce 3. CAD 4. Acute Renal Failure, with worsening creatinine after cath 5. Physical  deconditioning, severe  PLAN/DISCUSSION:  He remains very tenuous with worsening acute renal failure in the setting of overdiuresis and recent heart cath. Will stop lasix, lisinopril and potassium. BP is low so will give small bolus of IVF. (250cc NS)  Rate control achieved. He has been switched to Eliquis from coumadin. Will have to watch renal function carefully. He may not be able to take Eliquis if renal function does not improve.  Note consideration for Hospice consult.  Sharlot Gowda Taylor,MD 9:31 AM

## 2012-12-15 LAB — BASIC METABOLIC PANEL
BUN: 46 mg/dL — ABNORMAL HIGH (ref 6–23)
CO2: 26 mEq/L (ref 19–32)
CO2: 27 mEq/L (ref 19–32)
Calcium: 9.1 mg/dL (ref 8.4–10.5)
Chloride: 109 mEq/L (ref 96–112)
Creatinine, Ser: 1.7 mg/dL — ABNORMAL HIGH (ref 0.50–1.35)
Creatinine, Ser: 1.73 mg/dL — ABNORMAL HIGH (ref 0.50–1.35)
GFR calc Af Amer: 40 mL/min — ABNORMAL LOW (ref >90)
Glucose, Bld: 101 mg/dL — ABNORMAL HIGH (ref 70–99)

## 2012-12-15 LAB — CBC
Hemoglobin: 11.6 g/dL — ABNORMAL LOW (ref 13.0–17.0)
MCH: 27.2 pg (ref 26.0–34.0)
MCV: 85.7 fL (ref 78.0–100.0)
RBC: 4.26 MIL/uL (ref 4.22–5.81)

## 2012-12-15 MED ORDER — BISACODYL 5 MG PO TBEC
5.0000 mg | DELAYED_RELEASE_TABLET | Freq: Every day | ORAL | Status: DC | PRN
  Administered 2012-12-15 – 2012-12-20 (×4): 5 mg via ORAL
  Filled 2012-12-15 (×3): qty 1

## 2012-12-15 NOTE — Progress Notes (Signed)
Patient ID: [name], male   DOB: 05/01/1925, 79 y.o.   MRN: [id number]  "I feel better"  SUBJECTIVE:  79 yo with history of depression/anxiety admitted with atrial fibrillation/RVR and acute systolic CHF thought to be related to tachycardic CM.   12/10/12 LHC Moderate, long, around 60% stenosis in the proximal LAD  Possible thrombus on TEE 12/11/12, so patient was not cardioverted.  Remains on Toprol, amiodarone and digoxin.   Feels stronger. No orthopnea or PND. Cr improved     . amiodarone  400 mg Oral BID  . antiseptic oral rinse  15 mL Mouth Rinse BID  . apixaban  2.5 mg Oral BID  . aspirin EC  81 mg Oral Daily  . atorvastatin  20 mg Oral q1800  . buPROPion  75 mg Oral BID WC  . clonazePAM  0.5 mg Oral Daily  . digoxin  0.125 mg Oral Q48H  . metoprolol succinate  75 mg Oral BID  . sodium chloride  3 mL Intravenous Q12H  . sodium chloride  3 mL Intravenous Q12H  heparin gtt  Filed Vitals:   12/14/12 1032 12/14/12 1440 12/14/12 2035 12/15/12 0439  BP:  125/65 109/71 122/64  Pulse: 100 85 106 77  Temp:  97.5 F (36.4 C) 97.8 F (36.6 C) 97.8 F (36.6 C)  TempSrc:  Oral Oral Oral  Resp:  22 20 20   Height:      Weight:    187 lb 2.7 oz (84.9 kg)  SpO2:  95% 98% 98%    Intake/Output Summary (Last 24 hours) at 12/15/12 0950 Last data filed at 12/15/12 0800  Gross per 24 hour  Intake   1160 ml  Output    200 ml  Net    960 ml    LABS: Basic Metabolic Panel:  Recent Labs  40/98/11 2355 12/15/12 0500  NA 143 145  K 4.0 4.2  CL 109 110  CO2 26 27  GLUCOSE 99 101*  BUN 46* 44*  CREATININE 1.70* 1.73*  CALCIUM 8.9 9.1   Liver Function Tests: No results found for this basename: AST, ALT, ALKPHOS, BILITOT, PROT, ALBUMIN,  in the last 72 hours No results found for this basename: LIPASE, AMYLASE,  in the last 72 hours CBC:  Recent Labs  12/14/12 0630 12/15/12 0500  WBC 12.4* 12.1*  HGB 11.2* 11.6*  HCT 34.5* 36.5*  MCV 86.3 85.7  PLT 226 241     RADIOLOGY: Dg Chest Portable 1 View  12/06/2012  *RADIOLOGY REPORT*  Clinical Data: Shortness of breath.  PORTABLE CHEST - 1 VIEW  Comparison: 01/20/2012.  Findings: The heart is enlarged.  There is tortuosity and calcification of the thoracic aorta.  There is vascular congestion, asymmetric pulmonary edema and bilateral pleural effusions consistent with CHF.  IMPRESSION: Congestive heart failure.   Original Report Authenticated By: Rudie Meyer, M.D.     PHYSICAL EXAM General: Elderly frail. Chronically ill appearing Neck: supple no JVD  no thyromegaly or thyroid nodule.  Lungs: Clear to auscultation bilaterally with normal respiratory effort. CV: Nondisplaced PMI.  Heart irregular S1/S2, no S3/S4, 2/6 HSM at apex.   Abdomen: Soft, nontender, no hepatosplenomegaly, mild distention.  Neurologic: Alert and oriented x 3.  Psych: Normal affect. Extremities: No clubbing or cyanosis. No edema. Statis dermatitis with dry skin  TELEMETRY: Reviewed telemetry pt in atrial fibrillation in 80-90s  ASSESSMENT: 1. Atrial fibrillation 2. Acute Systolic Heart Failure - EF 25-30% - suspect tach induce 3. CAD 4. Acute  Renal Failure, now improving after fluid bolus 5. Physical deconditioning, severe 6. constipation  PLAN/DISCUSSION:  He remains very tenuous but now with improving acute renal failure in the setting of overdiuresis and recent heart cath. Will continue to hold lasix, lisinopril and potassium. He has responded to fluid bolus  Rate control achieved. He has been switched to Eliquis from coumadin. Will have to watch renal function carefully.  Stool softner   Note consideration for Hospice consult.  Sharlot Gowda Taylor,MD 9:50 AM

## 2012-12-16 ENCOUNTER — Encounter: Payer: Self-pay | Admitting: Internal Medicine

## 2012-12-16 LAB — CBC
HCT: 35.8 % — ABNORMAL LOW (ref 39.0–52.0)
Hemoglobin: 11.3 g/dL — ABNORMAL LOW (ref 13.0–17.0)
MCH: 27 pg (ref 26.0–34.0)
MCHC: 31.6 g/dL (ref 30.0–36.0)

## 2012-12-16 LAB — BASIC METABOLIC PANEL
BUN: 59 mg/dL — ABNORMAL HIGH (ref 6–23)
Calcium: 9.2 mg/dL (ref 8.4–10.5)
GFR calc non Af Amer: 20 mL/min — ABNORMAL LOW (ref >90)
Glucose, Bld: 116 mg/dL — ABNORMAL HIGH (ref 70–99)

## 2012-12-16 MED ORDER — APIXABAN 2.5 MG PO TABS
2.5000 mg | ORAL_TABLET | Freq: Two times a day (BID) | ORAL | Status: DC
  Administered 2012-12-16 (×2): 2.5 mg via ORAL
  Filled 2012-12-16 (×4): qty 1

## 2012-12-16 MED ORDER — SODIUM CHLORIDE 0.9 % IV SOLN
INTRAVENOUS | Status: AC
  Administered 2012-12-16: 10:00:00 via INTRAVENOUS

## 2012-12-16 MED ORDER — AMIODARONE HCL 200 MG PO TABS
200.0000 mg | ORAL_TABLET | Freq: Two times a day (BID) | ORAL | Status: DC
  Administered 2012-12-16 – 2012-12-20 (×9): 200 mg via ORAL
  Filled 2012-12-16 (×11): qty 1

## 2012-12-16 NOTE — Progress Notes (Signed)
Physical Therapy Treatment Patient Details Name: [name] MRN: [id number] DOB: January 27, 1925 Today's Date: 12/16/2012 Time: 4098-1191 PT Time Calculation (min): 25 min  PT Assessment / Plan / Recommendation Comments on Treatment Session  Pt requires max encouragement to participate.  He is self-limiting  & says "I can't" when he is asked to do something.       Follow Up Recommendations  SNF     Does the patient have the potential to tolerate intense rehabilitation     Barriers to Discharge        Equipment Recommendations  Rolling walker with 5" wheels    Recommendations for Other Services    Frequency Min 3X/week   Plan Discharge plan remains appropriate    Precautions / Restrictions Precautions Precautions: Fall Precaution Comments: pt wears a brace on L knee 2/2 needing TKA, however per pt he is too old for surgery.   Restrictions Weight Bearing Restrictions: No       Mobility  Bed Mobility Bed Mobility: Supine to Sit;Sitting - Scoot to Edge of Bed Supine to Sit: 3: Mod assist;HOB elevated;With rails Sitting - Scoot to Edge of Bed: 3: Mod assist Details for Bed Mobility Assistance: Cues for technique & increaes use of UE's.  (A) to lift shoulders/trunk to sitting upright, use of draw pad to pivot hips & to scoot forwards to EOB.   Max encouragement to perform as independently as possible.   Transfers Transfers: Sit to Stand;Stand to [employer] Transfers Sit to Stand: 3: Mod assist;With upper extremity assist;With armrests;From bed;From chair/3-in-1 Stand to Sit: 4: Min assist;With upper extremity assist;With armrests;To chair/3-in-1 Stand Pivot Transfers: 3: Mod assist Details for Transfer Assistance: Cues for hand placement, sequencing, "nose over toes" with standing, & safety.   Pt leans posteriorly on heels with standing.  (A) to achieve standing, balance, anterior translation of trunk over BOS, & controlled descent.  Pt able to take pivotal steps from  bed>recliner.   Stood 3x's.  Incontinent of bowel with each standing.   Ambulation/Gait Ambulation/Gait Assistance: Not tested (comment)     PT Goals Acute Rehab PT Goals Time For Goal Achievement: 12/26/12 Potential to Achieve Goals: Good Pt will go Supine/Side to Sit: with modified independence PT Goal: Supine/Side to Sit - Progress: Progressing toward goal Pt will go Sit to Supine/Side: with modified independence Pt will go Sit to Stand: with supervision PT Goal: Sit to Stand - Progress: Progressing toward goal Pt will go Stand to Sit: with supervision PT Goal: Stand to Sit - Progress: Progressing toward goal Pt will Ambulate: >150 feet;with supervision;with rolling walker Pt will Go Up / Down Stairs: 3-5 stairs;with min assist;with least restrictive assistive device  Visit Information  Last PT Received On: 12/16/12 Assistance Needed: +2 (if ambulating)    Subjective Data  Subjective: "I can't do it"   Cognition  Cognition Arousal/Alertness: Awake/alert Behavior During Therapy: WFL for tasks assessed/performed Overall Cognitive Status: Within Functional Limits for tasks assessed    Balance  Static Standing Balance Static Standing - Balance Support: Bilateral upper extremity supported Static Standing - Level of Assistance: 3: Mod assist Static Standing - Comment/# of Minutes: (A) for balance due to pt leans posteriorly on heels.    End of Session PT - End of Session Equipment Utilized During Treatment: Gait belt;Oxygen Activity Tolerance: Patient limited by fatigue Patient left: in chair;with call bell/phone within reach Nurse Communication: Mobility status     Verdell Face, [place] [phone] 12/16/2012

## 2012-12-16 NOTE — Clinical Documentation Improvement (Signed)
RENAL FAILURE DOCUMENTATION CLARIFICATION QUERY   CLINICAL DOCUMENTATION QUERIES ARE NOT PART OF THE PERMANENT MEDICAL RECORD    Please update your documentation within the medical record to reflect your response to this query.                                                                                                                                                                        12/16/12 Dr. Shirlee Latch,  In a better effort to capture your patient's severity of illness, reflect appropriate length of stay and utilization of resources, a review of the patient medical record has revealed the following information:   "AKI: Creatinine was improving yesterday but back up to 2.6 today. Possible combination of overdiuresis, relative hypotension, and contrast nephropathy from cath. Off Lasix and ACEi. Will give small amount IV fluid today" Progress Notes signed by Laurey Morale, MD at 12/16/2012 7:51 AM   S/P Left Heart Catheterization and Coronary Angiography on 12/10/12.   BUN/Cr/GFR this admission (white male)  41/1.38/45  38/1.31/48  30/1.08/60  21/0.91/75  21/1.04/23  22/0.98/72  28/1.06/61  46/2.29/24 58/2.94/18 46/1.70/35 44/1.73/34 59/2.69/20    Based on your clinical judgment, please provide greater specificity regarding the documented acute kidney injury:   Acute Kidney Injury with tubular necrosis    Acute Kidney Injury with cortical necrosis    Acute Kidney Injury with medullary (papillary) necrosis    Other type of Acute Kidney Injury    Unable to Clinically Determine    In responding to this query please exercise your independent judgment.   The fact that a query is asked, does not imply that any particular answer is desired or expected.     Reviewed: 12/18/12 - aki 2/2 postrenal obstruction per dr. Geanie Berlin.  Mathis Dad RN   Thank You,  Jerral Ralph RN BSN CCDS  Certified Clinical Documentation  Specialist:  Cell [phone]  Health Information Management  [place]

## 2012-12-16 NOTE — Progress Notes (Signed)
Patient ID: [name], male   DOB: June 30, 1925, 79 y.o.   MRN: [id number]    SUBJECTIVE: Creatinine worsened this morning after improvement yesterday.  Had a bad night, was not able to sleep due to interruptions.  Breathing stable without dyspnea.  Plan for SNF placement when renal fxn stabilizes.  HR under good control now.    [name] amiodarone  200 mg Oral BID  . antiseptic oral rinse  15 mL Mouth Rinse BID  . atorvastatin  20 mg Oral q1800  . buPROPion  75 mg Oral BID WC  . clonazePAM  0.5 mg Oral Daily  . digoxin  0.125 mg Oral Q48H  . metoprolol succinate  75 mg Oral BID  . sodium chloride  3 mL Intravenous Q12H  . sodium chloride  3 mL Intravenous Q12H    Filed Vitals:   12/15/12 1354 12/15/12 2035 12/15/12 2055 12/16/12 0524  BP: 116/62 112/72  107/71  Pulse: 82 56 96 75  Temp: 97.7 F (36.5 C) 98 F (36.7 C)  97.9 F (36.6 C)  TempSrc: Oral Oral  Oral  Resp: 18 20  18   Height:      Weight:    188 lb 15 oz (85.7 kg)  SpO2: 95% 98%  92%    Intake/Output Summary (Last 24 hours) at 12/16/12 0745 Last data filed at 12/16/12 0700  Gross per 24 hour  Intake    700 ml  Output      0 ml  Net    700 ml    LABS: Basic Metabolic Panel:  Recent Labs  78/29/56 0500 12/16/12 0615  NA 145 144  K 4.2 4.1  CL 110 108  CO2 27 27  GLUCOSE 101* 116*  BUN 44* 59*  CREATININE 1.73* 2.69*  CALCIUM 9.1 9.2   Liver Function Tests: No results found for this basename: AST, ALT, ALKPHOS, BILITOT, PROT, ALBUMIN,  in the last 72 hours No results found for this basename: LIPASE, AMYLASE,  in the last 72 hours CBC:  Recent Labs  12/15/12 0500 12/16/12 0615  WBC 12.1* 11.8*  HGB 11.6* 11.3*  HCT 36.5* 35.8*  MCV 85.7 85.6  PLT 241 250   Cardiac Enzymes: No results found for this basename: CKTOTAL, CKMB, CKMBINDEX, TROPONINI,  in the last 72 hours BNP: No components found with this basename: POCBNP,  D-Dimer: No results found for this basename: DDIMER,  in the last 72  hours Hemoglobin A1C: No results found for this basename: HGBA1C,  in the last 72 hours Fasting Lipid Panel: No results found for this basename: CHOL, HDL, LDLCALC, TRIG, CHOLHDL, LDLDIRECT,  in the last 72 hours Thyroid Function Tests: No results found for this basename: TSH, T4TOTAL, FREET3, T3FREE, THYROIDAB,  in the last 72 hours Anemia Panel: No results found for this basename: VITAMINB12, FOLATE, FERRITIN, TIBC, IRON, RETICCTPCT,  in the last 72 hours  RADIOLOGY: Dg Chest Portable 1 View  12/06/2012  *RADIOLOGY REPORT*  Clinical Data: Shortness of breath.  PORTABLE CHEST - 1 VIEW  Comparison: 01/20/2012.  Findings: The heart is enlarged.  There is tortuosity and calcification of the thoracic aorta.  There is vascular congestion, asymmetric pulmonary edema and bilateral pleural effusions consistent with CHF.  IMPRESSION: Congestive heart failure.   Original Report Authenticated By: Rudie Meyer, M.D.     PHYSICAL EXAM General: NAD Neck: JVP 8-9, no thyromegaly or thyroid nodule.  Lungs: Clear to auscultation bilaterally with normal respiratory effort. CV: Nondisplaced PMI.  Heart irregular  S1/S2, no S3/S4, 2/6 HSM at apex.  Trace ankle edema.  Abdomen: Soft, nontender, no hepatosplenomegaly, no distention.  Neurologic: Alert and oriented x 3.  Psych: Normal affect. Extremities: No clubbing or cyanosis.   TELEMETRY: Reviewed telemetry pt in atrial fibrillation in 90s  ASSESSMENT AND PLAN: 79 yo with history of depression/anxiety admitted with atrial fibrillation/RVR and acute systolic CHF. 1. Atrial fibrillation: HR 90s-100s this morning.  EF was 25-30% on echo. 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  Unable to cardiovert due to possible LAA thrombus on TEE.  - Continue digoxin qod and Toprol XL.  - Decrease amiodarone to 200 mg bid.  - Creatinine up to 2.6, will need to be careful with apixaban.  If creatinine  continues to rise will probably have to use coumadin instead.  2. CHF: Acute systolic, EF 25-30%.  Diffuse hypokinesis with some regionality in the basal inferior and inferoseptal walls. 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  LHC showed a long 60% proximal LAD stenosis but I do not think this caused his cardiomyopathy.  JVP mildly elevated this morning. - Continue Toprol XL and qod digoxin.  - Off ACEI and Lasix with AKI. 3. CAD: Moderate CAD with 60% long proximal LAD stenosis.  He is on statin and apixaban. 4. AKI: Creatinine was improving yesterday but back up to 2.6 today.  Possible combination of overdiuresis, relative hypotension, and contrast nephropathy from cath.  Off Lasix and ACEi.  Will give small amount IV fluid today.   5. Disposition: Will need SNF placement.  Continue PT.   [name] 12/16/2012

## 2012-12-17 ENCOUNTER — Encounter: Payer: Self-pay | Admitting: Internal Medicine

## 2012-12-17 ENCOUNTER — Inpatient Hospital Stay (HOSPITAL_COMMUNITY): Payer: Medicare Other

## 2012-12-17 LAB — BASIC METABOLIC PANEL
Calcium: 8.9 mg/dL (ref 8.4–10.5)
GFR calc non Af Amer: 12 mL/min — ABNORMAL LOW (ref >90)
Glucose, Bld: 91 mg/dL (ref 70–99)
Sodium: 143 mEq/L (ref 135–145)

## 2012-12-17 LAB — CBC
Hemoglobin: 11.6 g/dL — ABNORMAL LOW (ref 13.0–17.0)
MCH: 27.3 pg (ref 26.0–34.0)
MCHC: 31.8 g/dL (ref 30.0–36.0)
Platelets: 243 10*3/uL (ref 150–400)

## 2012-12-17 LAB — PROTIME-INR: INR: 2.06 — ABNORMAL HIGH (ref 0.00–1.49)

## 2012-12-17 MED ORDER — METOPROLOL SUCCINATE ER 50 MG PO TB24
50.0000 mg | ORAL_TABLET | Freq: Two times a day (BID) | ORAL | Status: DC
  Administered 2012-12-17 – 2012-12-18 (×4): 50 mg via ORAL
  Filled 2012-12-17 (×7): qty 1

## 2012-12-17 MED ORDER — WARFARIN SODIUM 5 MG PO TABS
5.0000 mg | ORAL_TABLET | Freq: Once | ORAL | Status: AC
  Administered 2012-12-17: 5 mg via ORAL
  Filled 2012-12-17: qty 1

## 2012-12-17 MED ORDER — WARFARIN - PHARMACIST DOSING INPATIENT: Freq: Every day | Status: DC

## 2012-12-17 MED ORDER — METOPROLOL SUCCINATE ER 50 MG PO TB24: 50.0000 mg | ORAL_TABLET | Freq: Two times a day (BID) | ORAL | Status: DC

## 2012-12-17 MED ORDER — FINASTERIDE 5 MG PO TABS
5.0000 mg | ORAL_TABLET | Freq: Every day | ORAL | Status: DC
  Administered 2012-12-17 – 2012-12-20 (×4): 5 mg via ORAL
  Filled 2012-12-17 (×4): qty 1

## 2012-12-17 NOTE — Progress Notes (Signed)
Attempted to insert foley #14 Fr noted pt unable to tolerate  Pt. C/o pain during insertion and noted a minimal bloody drainage after removal.  Dr. Trevor Iha informed and instructed to notify primary care that pt need foley and with distended abdomen.  MD ordered bladder scan - result > .   Will continue to monitor.  Amanda Pea, Charity fundraiser.

## 2012-12-17 NOTE — Progress Notes (Signed)
I have reviewed this note and agree with its contents.  Ignacia Palma, [place] [phone] 12/17/2012

## 2012-12-17 NOTE — Progress Notes (Signed)
Utilization Review Completed.   Jyren Cerasoli, RN, BSN Nurse Case Manager  [phone]  

## 2012-12-17 NOTE — Progress Notes (Signed)
Patient ID: [name], male   DOB: Dec 23, 1924, 79 y.o.   MRN: [id number]    SUBJECTIVE: Creatinine worsened this morning again.  He had some IV fluid yesterday.  He actually feels better this morning, had a good night.  Breathing stable without dyspnea.  Plan for SNF placement when renal fxn stabilizes.  HR under good control now.    [name] amiodarone  200 mg Oral BID  . antiseptic oral rinse  15 mL Mouth Rinse BID  . atorvastatin  20 mg Oral q1800  . buPROPion  75 mg Oral BID WC  . clonazePAM  0.5 mg Oral Daily  . metoprolol succinate  75 mg Oral BID  . sodium chloride  3 mL Intravenous Q12H    Filed Vitals:   12/16/12 0938 12/16/12 1422 12/16/12 1943 12/17/12 0549  BP: 106/71 95/64 96/61  103/63  Pulse: 80 96 102 89  Temp:  97.7 F (36.5 C) 98.6 F (37 C) 97.6 F (36.4 C)  TempSrc:  Oral Oral Oral  Resp:  18 18 18   Height:      Weight:    187 lb 6.3 oz (85 kg)  SpO2:  95% 98% 97%    Intake/Output Summary (Last 24 hours) at 12/17/12 0811 Last data filed at 12/17/12 0718  Gross per 24 hour  Intake    800 ml  Output    300 ml  Net    500 ml    LABS: Basic Metabolic Panel:  Recent Labs  53/66/44 0615 12/17/12 0452  NA 144 143  K 4.1 4.8  CL 108 108  CO2 27 23  GLUCOSE 116* 91  BUN 59* 80*  CREATININE 2.69* 4.17*  CALCIUM 9.2 8.9   Liver Function Tests: No results found for this basename: AST, ALT, ALKPHOS, BILITOT, PROT, ALBUMIN,  in the last 72 hours No results found for this basename: LIPASE, AMYLASE,  in the last 72 hours CBC:  Recent Labs  12/16/12 0615 12/17/12 0452  WBC 11.8* 13.3*  HGB 11.3* 11.6*  HCT 35.8* 36.5*  MCV 85.6 85.9  PLT 250 243   Cardiac Enzymes: No results found for this basename: CKTOTAL, CKMB, CKMBINDEX, TROPONINI,  in the last 72 hours BNP: No components found with this basename: POCBNP,  D-Dimer: No results found for this basename: DDIMER,  in the last 72 hours Hemoglobin A1C: No results found for this basename: HGBA1C,   in the last 72 hours Fasting Lipid Panel: No results found for this basename: CHOL, HDL, LDLCALC, TRIG, CHOLHDL, LDLDIRECT,  in the last 72 hours Thyroid Function Tests: No results found for this basename: TSH, T4TOTAL, FREET3, T3FREE, THYROIDAB,  in the last 72 hours Anemia Panel: No results found for this basename: VITAMINB12, FOLATE, FERRITIN, TIBC, IRON, RETICCTPCT,  in the last 72 hours  RADIOLOGY: Dg Chest Portable 1 View  12/06/2012  *RADIOLOGY REPORT*  Clinical Data: Shortness of breath.  PORTABLE CHEST - 1 VIEW  Comparison: 01/20/2012.  Findings: The heart is enlarged.  There is tortuosity and calcification of the thoracic aorta.  There is vascular congestion, asymmetric pulmonary edema and bilateral pleural effusions consistent with CHF.  IMPRESSION: Congestive heart failure.   Original Report Authenticated By: Rudie Meyer, M.D.     PHYSICAL EXAM General: NAD Neck: JVP 8-9, no thyromegaly or thyroid nodule.  Lungs: Clear to auscultation bilaterally with normal respiratory effort. CV: Nondisplaced PMI.  Heart irregular S1/S2, no S3/S4, 2/6 HSM at apex.  No edema.  Abdomen: Soft, nontender, no hepatosplenomegaly,  no distention.  Neurologic: Alert and oriented x 3.  Psych: Normal affect. Extremities: No clubbing or cyanosis.   TELEMETRY: Reviewed telemetry pt in atrial fibrillation in 80s-90s  ASSESSMENT AND PLAN: 79 yo with history of depression/anxiety admitted with atrial fibrillation/RVR and acute systolic CHF. 1. Atrial fibrillation: HR 80s-90s this morning.  EF was 25-30% on echo. 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  Unable to cardiovert due to possible LAA thrombus on TEE.  - Continue amiodarone and Toprol XL (decrease Toprol XL to 50 bid to try to keep BP up).  - Stop digoxin with worsening renal function. - Stop apixaban with worsening renal function, will use coumadin instead (dosed by pharmacy).  2.  CHF: Acute systolic, EF 25-30%.  Diffuse hypokinesis with some regionality in the basal inferior and inferoseptal walls. 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  LHC showed a long 60% proximal LAD stenosis but I do not think this caused his cardiomyopathy.  JVP mildly elevated this morning. - Continue Toprol XL - No ACEI with AKI. 3. CAD: Moderate CAD with 60% long proximal LAD stenosis.  He is on statin and anticoagulated. 4. AKI: Creatinine worse again today. Possible combination of CHF/overdiuresis, relative hypotension, and contrast nephropathy from cath.  Off Lasix and ACEi since the weekend.  No effect from IV fluid yesterday, suspect ATN.  Hopefully renal function will plateau and improve.  Will involve nephrology.  Decrease Toprol XL to 50 mg bid to see if we can keep BP up more.  5. Disposition: Will need SNF placement.  Continue PT.   [name] 12/17/2012

## 2012-12-17 NOTE — Progress Notes (Signed)
OT Cancellation Note  Patient Details Name: [name] MRN: [id number] DOB: [birth year]   Cancelled Treatment:     Pt did not want to participate in the therapy due to feeling faint.  Pt stated RN would be in soon to give him meds  Rexford Maus 12/17/2012, 1:58 PM

## 2012-12-17 NOTE — Consult Note (Signed)
Reason for Consult: Urinary Retention / Difficult Foley / Acute Renal Failure  Referring Physician: Dietrich Pates MD  VAN SEYMORE is an 79 y.o. male.   HPI:  1 - Urinary Retention / Difficult Foley - Pt with moderate baseline obstructive symtpoms on Doxazosin per PCP, no episodes of frank retention. Now with small volume voids x several past 2 days worrisome for retention.  2 - Acute Renal Failure - Baseline Cr 1's. Now with rapid rise to 4's over past few days. Renal US with mild bilateral hydro to level of distended bladder.  3 - Rt Renal Cysts - Pt with 8cm and 6cm minimally complex Rt renal cysts incidental on renal US performed for acute renal failure.   Past Medical History  Diagnosis Date  . Anemia     nos  . History of colonic polyps   . Hyperlipidemia   . Hypertension   . Cancer [phone]    hx of basal cell  . Total knee replacement status     right  . BPH (benign prostatic hyperplasia)   . Anxiety attack     panic   . Pre-syncope     echo 03/2010,with EF 60%,mild RV dilation,no significant abnormalities...event monitor for 3wks/episodes os sinus bradycardia with heart rate to low 40's occasionally at night(suspect while sleeping)    Past Surgical History  Procedure Laterality Date  . Colonoscopy w/ polypectomy  2007  . Cholecystectomy    . Total knee arthroplasty    . Mohs surgery  2010  . Tee without cardioversion N/A 12/11/2012    Procedure: TRANSESOPHAGEAL ECHOCARDIOGRAM (TEE);  Surgeon: Vesta Mixer, MD;  Location: [facility] ENDOSCOPY;  Service: Cardiovascular;  Laterality: N/A;  ja/bev.    Family History  Problem Relation Age of Onset  . Heart attack Father   . Heart failure Father 30    S/P Turp  . Heart disease Father 42    MI.Marland KitchenMarland KitchenCHF S/P TURP @ 70  . Stroke Brother 58  . Coronary artery disease Brother   . Lung cancer Brother     Social History:  reports that he quit smoking about 29 years ago. He does not have any smokeless tobacco history on file. He  reports that he does not drink alcohol or use illicit drugs.  Allergies:  Allergies  Allergen Reactions  . Darifenacin Hydrobromide     REACTION: constipation  . Fluoxetine Hcl     REACTION: Very Depressed while taking    Medications: I have reviewed the patient's current medications.  Results for orders placed during the hospital encounter of 12/06/12 (from the past 48 hour(s))  CBC     Status: Abnormal   Collection Time    12/16/12  6:15 AM      Result Value Range   WBC 11.8 (*) 4.0 - 10.5 K/uL   RBC 4.18 (*) 4.22 - 5.81 MIL/uL   Hemoglobin 11.3 (*) 13.0 - 17.0 g/dL   HCT 84.6 (*) 96.2 - 95.2 %   MCV 85.6  78.0 - 100.0 fL   MCH 27.0  26.0 - 34.0 pg   MCHC 31.6  30.0 - 36.0 g/dL   RDW 84.1 (*) 32.4 - 40.1 %   Platelets 250  150 - 400 K/uL  BASIC METABOLIC PANEL     Status: Abnormal   Collection Time    12/16/12  6:15 AM      Result Value Range   Sodium 144  135 - 145 mEq/L   Potassium 4.1  3.5 -  5.1 mEq/L   Chloride 108  96 - 112 mEq/L   CO2 27  19 - 32 mEq/L   Glucose, Bld 116 (*) 70 - 99 mg/dL   BUN 59 (*) 6 - 23 mg/dL   Creatinine, Ser 9.60 (*) 0.50 - 1.35 mg/dL   Comment: DELTA CHECK NOTED   Calcium 9.2  8.4 - 10.5 mg/dL   GFR calc non Af Amer 20 (*) >90 mL/min   GFR calc Af Amer 23 (*) >90 mL/min   Comment:            The eGFR has been calculated     using the CKD EPI equation.     This calculation has not been     validated in all clinical     situations.     eGFR's persistently     <90 mL/min signify     possible Chronic Kidney Disease.  CBC     Status: Abnormal   Collection Time    12/17/12  4:52 AM      Result Value Range   WBC 13.3 (*) 4.0 - 10.5 K/uL   RBC 4.25  4.22 - 5.81 MIL/uL   Hemoglobin 11.6 (*) 13.0 - 17.0 g/dL   HCT 45.4 (*) 09.8 - 11.9 %   MCV 85.9  78.0 - 100.0 fL   MCH 27.3  26.0 - 34.0 pg   MCHC 31.8  30.0 - 36.0 g/dL   RDW 14.7 (*) 82.9 - 56.2 %   Platelets 243  150 - 400 K/uL  BASIC METABOLIC PANEL     Status: Abnormal    Collection Time    12/17/12  4:52 AM      Result Value Range   Sodium 143  135 - 145 mEq/L   Potassium 4.8  3.5 - 5.1 mEq/L   Chloride 108  96 - 112 mEq/L   CO2 23  19 - 32 mEq/L   Glucose, Bld 91  70 - 99 mg/dL   BUN 80 (*) 6 - 23 mg/dL   Comment: REPEATED TO VERIFY   Creatinine, Ser 4.17 (*) 0.50 - 1.35 mg/dL   Comment: REPEATED TO VERIFY   Calcium 8.9  8.4 - 10.5 mg/dL   GFR calc non Af Amer 12 (*) >90 mL/min   GFR calc Af Amer 14 (*) >90 mL/min   Comment:            The eGFR has been calculated     using the CKD EPI equation.     This calculation has not been     validated in all clinical     situations.     eGFR's persistently     <90 mL/min signify     possible Chronic Kidney Disease.  PROTIME-INR     Status: Abnormal   Collection Time    12/17/12  4:52 AM      Result Value Range   Prothrombin Time 22.4 (*) 11.6 - 15.2 seconds   INR 2.06 (*) 0.00 - 1.49    US Renal  12/17/2012  *RADIOLOGY REPORT*  Clinical Data: Acute renal insufficiency after cardiac catheterization.  RENAL/URINARY TRACT ULTRASOUND COMPLETE  Comparison:  10/22/2006  Findings:  Right Kidney:  15.2 cm.  Mild to moderate hydronephrosis.  Upper pole right renal cyst measures 8.4 cm and is enlarged since the prior exam.  There is an interpolar right renal 6.9 cm cyst.  This may have complexity in its inferior portion on image 19. Alternatively this apparent complexity  could be artifactual. Normal renal cortical thickness for age.  Left Kidney:  12.6 cm.  Normal renal cortical thickness for age. Minimal pelvicaliectasis.  Favored to be a secondary to the extent of bladder distention.  Bladder:  Borderline bladder distention.  Bilateral ureteric jets identified.  IMPRESSION:  1.  Mild-moderate right and minimal left-sided hydronephrosis. Left-sided pelvicaliectasis is favored to be within normal variation, given the extent of borderline bladder distention. Right sided hydronephrosis is without identifiable cause.   Consider further characterization with CT. 2.  Right renal lesions.  Dominant upper pole lesion which is likely a cyst.  An interpolar lesion may have complexity in its inferior portion.  This also would be better evaluated with CT.  If the patient cannot receive CT contrast, unenhanced non emergent MRI may also be informative.   Original Report Authenticated By: Jeronimo Greaves, M.D.     Review of Systems  Constitutional: Negative.  Negative for fever and chills.  HENT: Negative.   Eyes: Negative.   Respiratory: Positive for shortness of breath.   Cardiovascular: Negative.  Negative for chest pain.  Genitourinary: Negative.  Negative for dysuria, urgency, frequency, hematuria and flank pain.  Musculoskeletal: Negative.   Skin: Negative.   Neurological: Negative.   Endo/Heme/Allergies: Negative.   Psychiatric/Behavioral: Negative.    Blood pressure 106/68, pulse 75, temperature 97.8 F (36.6 C), temperature source Oral, resp. rate 20, height 6' (1.829 m), weight 85 kg (187 lb 6.3 oz), SpO2 97.00%. Physical Exam  Constitutional: He is oriented to person, place, and time. He appears well-developed and well-nourished.  HENT:  Head: Normocephalic and atraumatic.  Eyes: Pupils are equal, round, and reactive to light.  Neck: Normal range of motion. Neck supple.  Cardiovascular: Normal rate and regular rhythm.   Respiratory: Effort normal and breath sounds normal.  GI: Soft. Bowel sounds are normal.  Genitourinary: Penis normal.  54F coude foley placed without problem using aseptic technique with immediate efflux of 2200 cc clear urine.   Musculoskeletal: Normal range of motion.  Neurological: He is alert and oriented to person, place, and time.  Skin: Skin is warm and dry.  Psychiatric: He has a normal mood and affect. His behavior is normal. Judgment and thought content normal.    Assessment/Plan: 1 - Urinary Retention / Difficult Foley - Likely with BPH by history and exam. 54F foley  placed as per above. PSA today (to verify <20 to rule out advanced cancer, not for screening). Foley to remain for now and begin finasteride to be continued ad DC. Will plan for trial of void in urology office in few weeks to allow bladder to rest, having been so massively distended (>2L).  2 - Acute Renal Failure - Obstructive component from lower tract / prostate clearly contributory as well as likely diuresis. Should trend back to normal now.  3 - Rt Renal Cysts - Minimally complex. Pt not candidate for any definitive therapy. Would NOT characterize further with additional imaging. Observe.  4 - Will follow, call with questions.   Marquon Alcala 12/17/2012, 7:36 PM

## 2012-12-17 NOTE — Progress Notes (Signed)
ANTICOAGULATION CONSULT NOTE - Follow Up Consult  Pharmacy Consult for Coumadin Indication: atrial fibrillation and possilbe LAA thrombus  Allergies  Allergen Reactions  . Darifenacin Hydrobromide     REACTION: constipation  . Fluoxetine Hcl     REACTION: Very Depressed while taking    Patient Measurements: Height: 6' (182.9 cm) Weight: 187 lb 6.3 oz (85 kg) IBW/kg (Calculated) : 77.6  Vital Signs: Temp: 97.6 F (36.4 C) (04/29 0549) Temp src: Oral (04/29 0549) BP: 122/70 mmHg (04/29 0948) Pulse Rate: 84 (04/29 0948)  Labs:  Recent Labs  12/15/12 0500 12/16/12 0615 12/17/12 0452  HGB 11.6* 11.3* 11.6*  HCT 36.5* 35.8* 36.5*  PLT 241 250 243  LABPROT  --   --  22.4*  INR  --   --  2.06*  CREATININE 1.73* 2.69* 4.17*    Estimated Creatinine Clearance: 14 ml/min (by C-G formula based on Cr of 4.17).  Assessment:   Rx had previously assisted with Heparin and Coumadin dosing.  Had Coumadin 4 mg on 4/23, then 5 mg on 4/24, then changed to Eliquis 2.5 mg BID on 4/25.  Now to change back to Coumadin the worsening renal function.  INR today is in target range, but not interpretable when on Eliquis. Last Eliquis dose ~10pm on 4/28.  Goal of Therapy:  INR 2-3 Monitor platelets by anticoagulation protocol: Yes   Plan:   Coumadin 5 mg today.  To begin when dose available, as opposed to usual 6pm dosing time.  Daily PT/INR.  May need coverage with parenteral anticoagulant.  Dennie Fetters, RPh Pager: [phone] 12/17/2012,10:24 AM

## 2012-12-17 NOTE — Consult Note (Signed)
Reason for Consult:AKI following cardiac cath Referring Physician: [name], MD  [name] is an 79 y.o. male.  HPI: Pt is an 79yo WM with PMH sig for HTN who presented to [facility] on 12/06/12 with increasing SOB.  He was noted to be in A fib with RVR and was admitted for further evaluation and management.  He was treated with IV Dilt for rate control and started on IV diuretics for pulm edema.  He diuresed 6kg and underwent cardiac cath on 12/10/12 which did not reveal significant lesions.  Of note, he had also been taking lisinopril until 12/13/12 when this was held due to an abrupt rise in Scr.  We were asked to help evaluate and manage his AKI in the setting of contrast, diuresis, and ACE inhibition.  The trend in Scr is seen below.  [name] is very anxious and reports that "I am going to die".  He states that he doesn't want to die and that he needs to get home to his wife who has memory problems.  Trend in Creatinine:  Creatinine, Ser  Date/Time Value Range Status  12/17/2012  4:52 AM 4.17* 0.50 - 1.35 mg/dL Final  1/61/0960  4:54 AM 2.69* 0.50 - 1.35 mg/dL Final  0/98/1191  4:78 AM 1.73* 0.50 - 1.35 mg/dL Final  2/95/6213 08:65 PM 1.70* 0.50 - 1.35 mg/dL Final  7/84/6962  9:52 AM 2.94* 0.50 - 1.35 mg/dL Final  8/41/3244  0:10 AM 2.29* 0.50 - 1.35 mg/dL Final  2/72/5366  4:40 AM 1.06  0.50 - 1.35 mg/dL Final  3/47/4259  5:63 AM 0.98  0.50 - 1.35 mg/dL Final  8/75/6433  2:95 AM 1.04  0.50 - 1.35 mg/dL Final  1/88/4166  0:63 AM 0.91  0.50 - 1.35 mg/dL Final  0/16/0109  3:23 AM 1.08  0.50 - 1.35 mg/dL Final  5/57/3220  2:54 AM 1.31  0.50 - 1.35 mg/dL Final  2/70/6237  6:28 PM 1.38* 0.50 - 1.35 mg/dL Final  10/20/5174  1:60 PM 1.23  0.50 - 1.35 mg/dL Final  02/20/7105  2:69 PM 1.06  0.50 - 1.35 mg/dL Final  11/26/5460  7:03 PM 1.1  0.4 - 1.5 mg/dL Final  5/00/9381  8:29 AM 0.8  0.4-1.5 mg/dL Final  9/37/1696 78:93 AM 1.0  0.4-1.5 mg/dL Final  03/21/174 10:25 AM 0.9  0.4-1.5 mg/dL Final     PMH:   Past Medical History  Diagnosis Date  . Anemia     nos  . History of colonic polyps   . Hyperlipidemia   . Hypertension   . Cancer [phone]    hx of basal cell  . Total knee replacement status     right  . BPH (benign prostatic hyperplasia)   . Anxiety attack     panic   . Pre-syncope     echo 03/2010,with EF 60%,mild RV dilation,no significant abnormalities...event monitor for 3wks/episodes os sinus bradycardia with heart rate to low 40's occasionally at night(suspect while sleeping)    PSH:   Past Surgical History  Procedure Laterality Date  . Colonoscopy w/ polypectomy  2007  . Cholecystectomy    . Total knee arthroplasty    . Mohs surgery  2010  . Tee without cardioversion N/A 12/11/2012    Procedure: TRANSESOPHAGEAL ECHOCARDIOGRAM (TEE);  Surgeon: Vesta Mixer, MD;  Location: [facility] ENDOSCOPY;  Service: Cardiovascular;  Laterality: N/A;  ja/bev.    Allergies:  Allergies  Allergen Reactions  . Darifenacin Hydrobromide     REACTION:  constipation  . Fluoxetine Hcl     REACTION: Very Depressed while taking    Medications:   Prior to Admission medications   Medication Sig Start Date End Date Taking? Authorizing Provider  acetaminophen (RA ARTHRITIS PAIN RELIEF) 650 MG CR tablet Take 650 mg by mouth Nightly.     Yes Historical Provider, MD  aspirin 81 MG tablet Take 81 mg by mouth daily.     Yes Historical Provider, MD  buPROPion (WELLBUTRIN) 75 MG tablet TAKE 1 TABLET TWICE A DAY 08/23/12  Yes Pecola Lawless, MD  clonazePAM (KLONOPIN) 0.5 MG tablet TAKE 1 TAB IN THE MORNING & 2 TABS AT BEDTIME (THIS MED IS "AS NEEDED" & NOT TAKEN ON REGULAR BASIS) 11/19/12  Yes Pecola Lawless, MD  doxazosin (CARDURA) 2 MG tablet TAKE 1/2 TABLET BY MOUTH EVERY DAY AT BEDTIME 06/11/12  Yes Pecola Lawless, MD  Ginkgo Biloba 120 MG CAPS Take 1 capsule by mouth every other day.    Yes Historical Provider, MD  hydrocortisone (ANUSOL-HC) 25 MG suppository Place 1 suppository (25  mg total) rectally 2 (two) times daily. 11/25/12  Yes Nicki Reaper, NP  multivitamin (METANX) 3-35-2 MG TABS tablet Take 1 tablet by mouth 2 (two) times daily.   Yes Historical Provider, MD    Inpatient medications: . amiodarone  200 mg Oral BID  . antiseptic oral rinse  15 mL Mouth Rinse BID  . atorvastatin  20 mg Oral q1800  . buPROPion  75 mg Oral BID WC  . clonazePAM  0.5 mg Oral Daily  . metoprolol succinate  50 mg Oral BID  . sodium chloride  3 mL Intravenous Q12H    Discontinued Meds:   Medications Discontinued During This Encounter  Medication Reason  . diltiazem (CARDIZEM) 100 mg in dextrose 5 % 100 mL infusion   . doxazosin (CARDURA) tablet 1 mg   . metoprolol succinate (TOPROL-XL) 24 hr tablet 50 mg   . furosemide (LASIX) injection 40 mg   . heparin ADULT infusion 100 units/mL (25000 units/250 mL)   . sodium chloride 0.9 % injection 3 mL Patient Transfer  . sodium chloride 0.9 % injection 3 mL Patient Transfer  . 0.9 %  sodium chloride infusion Patient Transfer  . warfarin (COUMADIN) tablet 7.5 mg   . midazolam (VERSED) injection Patient Discharge  . fentaNYL (SUBLIMAZE) injection Patient Discharge  . butamben-tetracaine-benzocaine (CETACAINE) spray Patient Discharge  . 0.9 %  sodium chloride infusion   . metoprolol succinate (TOPROL-XL) 24 hr tablet 50 mg   . furosemide (LASIX) tablet 40 mg   . metoprolol succinate (TOPROL-XL) 24 hr tablet 50 mg   . furosemide (LASIX) injection 40 mg   . lisinopril (PRINIVIL,ZESTRIL) tablet 2.5 mg Change in therapy  . digoxin (LANOXIN) tablet 0.125 mg   . metoprolol succinate (TOPROL-XL) 24 hr tablet 75 mg   . Warfarin - Pharmacist Dosing Inpatient Change in therapy  . warfarin (COUMADIN) tablet 5 mg Change in therapy  . apixaban (ELIQUIS) tablet 2.5 mg   . aspirin EC tablet 81 mg   . amiodarone (PACERONE) tablet 400 mg   . sodium chloride 0.9 % injection 3 mL Duplicate  . sodium chloride 0.9 % injection 3 mL Duplicate  . 0.9 %   sodium chloride infusion Duplicate  . digoxin (LANOXIN) tablet 0.125 mg   . apixaban (ELIQUIS) tablet 2.5 mg   . metoprolol succinate (TOPROL-XL) 24 hr tablet 75 mg   . metoprolol succinate (TOPROL-XL) 24 hr tablet  50 mg     Social History:  reports that he quit smoking about 29 years ago. He does not have any smokeless tobacco history on file. He reports that he does not drink alcohol or use illicit drugs.  Family History:   Family History  Problem Relation Age of Onset  . Heart attack Father   . Heart failure Father 37    S/P Turp  . Heart disease Father 71    MI.Marland KitchenMarland KitchenCHF S/P TURP @ 70  . Stroke Brother 61  . Coronary artery disease Brother   . Lung cancer Brother     Pertinent items are noted in HPI. Weight change: -0.7 kg (-1 lb 8.7 oz)  Intake/Output Summary (Last 24 hours) at 12/17/12 0851 Last data filed at 12/17/12 0818  Gross per 24 hour  Intake   1040 ml  Output    300 ml  Net    740 ml    General appearance: fatigued, pale and frail  Head: Normocephalic, without obvious abnormality, atraumatic Eyes: negative findings: lids and lashes normal, conjunctivae and sclerae normal and corneas clear Neck: no adenopathy, no carotid bruit, no JVD, supple, symmetrical, trachea midline and thyroid not enlarged, symmetric, no tenderness/mass/nodules Resp: occasional crackles bilaterally Cardio: irregularly irregular rhythm and no rub GI: abnormal findings:  distended and mild tenderness suprabupic area and firm mass in suprapubic region  Extremities: minimal pretib edema, onychomycotic changes to toenails bilaterally  Labs: Basic Metabolic Panel:  Recent Labs Lab 12/12/12 0600 12/13/12 0430 12/14/12 0630 12/14/12 2355 12/15/12 0500 12/16/12 0615 12/17/12 0452  NA 140 142 143 143 145 144 143  K 3.9 3.8 3.8 4.0 4.2 4.1 4.8  CL 103 105 105 109 110 108 108  CO2 28 26 28 26 27 27 23   GLUCOSE 122* 100* 115* 99 101* 116* 91  BUN 28* 46* 58* 46* 44* 59* 80*  CREATININE  1.06 2.29* 2.94* 1.70* 1.73* 2.69* 4.17*  CALCIUM 9.2 9.1 9.2 8.9 9.1 9.2 8.9   Liver Function Tests: No results found for this basename: AST, ALT, ALKPHOS, BILITOT, PROT, ALBUMIN,  in the last 168 hours No results found for this basename: LIPASE, AMYLASE,  in the last 168 hours No results found for this basename: AMMONIA,  in the last 168 hours CBC:  Recent Labs Lab 12/14/12 0630 12/15/12 0500 12/16/12 0615 12/17/12 0452  WBC 12.4* 12.1* 11.8* 13.3*  HGB 11.2* 11.6* 11.3* 11.6*  HCT 34.5* 36.5* 35.8* 36.5*  MCV 86.3 85.7 85.6 85.9  PLT 226 241 250 243   PT/INR: @labrcntip (inr:5) Cardiac Enzymes: No results found for this basename: CKTOTAL, CKMB, CKMBINDEX, TROPONINI,  in the last 168 hours CBG:  Recent Labs Lab 12/13/12 2153 12/14/12 0601 12/14/12 1116 12/14/12 1641  GLUCAP 175* 111* 109* 99    Iron Studies: No results found for this basename: IRON, TIBC, TRANSFERRIN, FERRITIN,  in the last 168 hours  Xrays/Other Studies: No results found.   Assessment/Plan: 1.  AKI- pt with multiple renal insults including IV diuresis/relative hypotension in setting of A Fib with RVR, IV contrast with concomitant ACE Inhibition.  Also has condom cath in place with what appears to be an enlarged and tender bladder.  Will check bladder scan and place Foley catheter if distended.  Agree with holding ACE and diuretics. Will follow UOP and daily Scr.  Pt is a poor dialysis candidate and will hopefully start to improve soon. 2. A Fib with RVR- rate controlled 3. CHF- diuresed ~6kg since admission.  Will  follow off of diuretics 4. ACDz- stable 5. CAD- nonobstructing.  Plan per cardiology 6. HTN- stable 7. Anxiety- per primary service.  8. Painful suprapubic mass- likely related to bladder outlet obstruction from an enlarged prostate.  Check bladder scan and place Foley cath if PVR >237ml. 9. Dispo- chronically ill and frail WM.  Unclear if he will be strong enough to return home to care  for his wife.  Recommend PT/OT eval.   Nayelie Gionfriddo A 12/17/2012, 8:51 AM

## 2012-12-18 LAB — CBC
HCT: 36.1 % — ABNORMAL LOW (ref 39.0–52.0)
MCV: 84.9 fL (ref 78.0–100.0)
RBC: 4.25 MIL/uL (ref 4.22–5.81)
WBC: 13.1 10*3/uL — ABNORMAL HIGH (ref 4.0–10.5)

## 2012-12-18 LAB — BASIC METABOLIC PANEL
BUN: 64 mg/dL — ABNORMAL HIGH (ref 6–23)
CO2: 26 mEq/L (ref 19–32)
Chloride: 113 mEq/L — ABNORMAL HIGH (ref 96–112)
Creatinine, Ser: 2.49 mg/dL — ABNORMAL HIGH (ref 0.50–1.35)
Glucose, Bld: 105 mg/dL — ABNORMAL HIGH (ref 70–99)

## 2012-12-18 LAB — URINE MICROSCOPIC-ADD ON

## 2012-12-18 LAB — PROTIME-INR: Prothrombin Time: 23 seconds — ABNORMAL HIGH (ref 11.6–15.2)

## 2012-12-18 LAB — URINALYSIS, ROUTINE W REFLEX MICROSCOPIC
Glucose, UA: NEGATIVE mg/dL
Protein, ur: 30 mg/dL — AB
pH: 5 (ref 5.0–8.0)

## 2012-12-18 MED ORDER — WARFARIN SODIUM 5 MG PO TABS
5.0000 mg | ORAL_TABLET | Freq: Once | ORAL | Status: AC
  Administered 2012-12-18: 5 mg via ORAL
  Filled 2012-12-18: qty 1

## 2012-12-18 NOTE — Progress Notes (Signed)
Patient ID: AXIEL FJELD, male   DOB: 1925-01-22, 79 y.o.   MRN: [id number] S:feels a lot better O:BP 120/68  Pulse 88  Temp(Src) 97.8 F (36.6 C) (Oral)  Resp 20  Ht 6' (1.829 m)  Wt 85.3 kg (188 lb 0.8 oz)  BMI 25.5 kg/m2  SpO2 96%  Intake/Output Summary (Last 24 hours) at 12/18/12 1006 Last data filed at 12/18/12 0850  Gross per 24 hour  Intake   1200 ml  Output   3800 ml  Net  -2600 ml   Intake/Output: I/O last 3 completed shifts: In: 1440 [P.O.:1440] Out: 4100 [Urine:4100]  Intake/Output this shift:  Total I/O In: 360 [P.O.:360] Out: -  Weight change: 0.3 kg (10.6 oz) WUJ:WJXBJ WM in NAD CVS:IRR IRR no rub Resp:cta YNW:GNFAOZ Ext:no edema   Recent Labs Lab 12/13/12 0430 12/14/12 0630 12/14/12 2355 12/15/12 0500 12/16/12 0615 12/17/12 0452 12/18/12 0600  NA 142 143 143 145 144 143 146*  K 3.8 3.8 4.0 4.2 4.1 4.8 4.4  CL 105 105 109 110 108 108 113*  CO2 26 28 26 27 27 23 26   GLUCOSE 100* 115* 99 101* 116* 91 105*  BUN 46* 58* 46* 44* 59* 80* 64*  CREATININE 2.29* 2.94* 1.70* 1.73* 2.69* 4.17* 2.49*  CALCIUM 9.1 9.2 8.9 9.1 9.2 8.9 9.1   Liver Function Tests: No results found for this basename: AST, ALT, ALKPHOS, BILITOT, PROT, ALBUMIN,  in the last 168 hours No results found for this basename: LIPASE, AMYLASE,  in the last 168 hours No results found for this basename: AMMONIA,  in the last 168 hours CBC:  Recent Labs Lab 12/14/12 0630 12/15/12 0500 12/16/12 0615 12/17/12 0452 12/18/12 0600  WBC 12.4* 12.1* 11.8* 13.3* 13.1*  HGB 11.2* 11.6* 11.3* 11.6* 11.7*  HCT 34.5* 36.5* 35.8* 36.5* 36.1*  MCV 86.3 85.7 85.6 85.9 84.9  PLT 226 241 250 243 252   Cardiac Enzymes: No results found for this basename: CKTOTAL, CKMB, CKMBINDEX, TROPONINI,  in the last 168 hours CBG:  Recent Labs Lab 12/13/12 2153 12/14/12 0601 12/14/12 1116 12/14/12 1641  GLUCAP 175* 111* 109* 99    Iron Studies: No results found for this basename: IRON, TIBC,  TRANSFERRIN, FERRITIN,  in the last 72 hours Studies/Results: US Renal  12/17/2012  *RADIOLOGY REPORT*  Clinical Data: Acute renal insufficiency after cardiac catheterization.  RENAL/URINARY TRACT ULTRASOUND COMPLETE  Comparison:  10/22/2006  Findings:  Right Kidney:  15.2 cm.  Mild to moderate hydronephrosis.  Upper pole right renal cyst measures 8.4 cm and is enlarged since the prior exam.  There is an interpolar right renal 6.9 cm cyst.  This may have complexity in its inferior portion on image 19. Alternatively this apparent complexity could be artifactual. Normal renal cortical thickness for age.  Left Kidney:  12.6 cm.  Normal renal cortical thickness for age. Minimal pelvicaliectasis.  Favored to be a secondary to the extent of bladder distention.  Bladder:  Borderline bladder distention.  Bilateral ureteric jets identified.  IMPRESSION:  1.  Mild-moderate right and minimal left-sided hydronephrosis. Left-sided pelvicaliectasis is favored to be within normal variation, given the extent of borderline bladder distention. Right sided hydronephrosis is without identifiable cause.  Consider further characterization with CT. 2.  Right renal lesions.  Dominant upper pole lesion which is likely a cyst.  An interpolar lesion may have complexity in its inferior portion.  This also would be better evaluated with CT.  If the patient cannot receive CT contrast,  unenhanced non emergent MRI may also be informative.   Original Report Authenticated By: Jeronimo Greaves, M.D.    . amiodarone  200 mg Oral BID  . antiseptic oral rinse  15 mL Mouth Rinse BID  . atorvastatin  20 mg Oral q1800  . buPROPion  75 mg Oral BID WC  . clonazePAM  0.5 mg Oral Daily  . finasteride  5 mg Oral Daily  . metoprolol succinate  50 mg Oral BID  . sodium chloride  3 mL Intravenous Q12H  . warfarin  5 mg Oral ONCE-1800  . Warfarin - Pharmacist Dosing Inpatient   Does not apply q1800    BMET    Component Value Date/Time   NA 146*  12/18/2012 0600   K 4.4 12/18/2012 0600   CL 113* 12/18/2012 0600   CO2 26 12/18/2012 0600   GLUCOSE 105* 12/18/2012 0600   BUN 64* 12/18/2012 0600   CREATININE 2.49* 12/18/2012 0600   CREATININE 0.99 01/20/2011 1711   CALCIUM 9.1 12/18/2012 0600   GFRNONAA 22* 12/18/2012 0600   GFRAA 25* 12/18/2012 0600   CBC    Component Value Date/Time   WBC 13.1* 12/18/2012 0600   RBC 4.25 12/18/2012 0600   HGB 11.7* 12/18/2012 0600   HCT 36.1* 12/18/2012 0600   PLT 252 12/18/2012 0600   MCV 84.9 12/18/2012 0600   MCH 27.5 12/18/2012 0600   MCHC 32.4 12/18/2012 0600   RDW 16.3* 12/18/2012 0600   LYMPHSABS 1.0 12/06/2012 1710   MONOABS 1.2* 12/06/2012 1710   EOSABS 0.1 12/06/2012 1710   BASOSABS 0.0 12/06/2012 1710     Assessment/Plan:  1. AKI- pt with multiple renal insults including IV diuresis/relative hypotension in setting of A Fib with RVR, IV contrast with concomitant ACE Inhibition further complicated by bladder outlet obstruction and hydro.  Marked improvement following Foley cath placement and drainage of bladder.  Scr has significantly improved as well as UOP.  Agree with holding ACE and diuretics. Will follow UOP and daily Scr. Pt is a poor dialysis candidate. 2. BOO- s/p foley. F/u with Urology as an outpt and keep foley in place for now 3. Hypernatremia- increase free water intake and follow.  Hold diuretics as pt is volume depleted 4. A Fib with RVR- rate controlled 5. CHF- diuresed ~6kg since admission. Will follow off of diuretics 6. ACDz- stable 7. CAD- nonobstructing. Plan per cardiology 8. HTN- stable 9. Anxiety- per primary service.  10. Dispo- chronically ill and frail WM. Unclear if he will be strong enough to return home to care for his wife. Recommend PT/OT eval.    Denvil Canning A

## 2012-12-18 NOTE — Progress Notes (Signed)
Patient ID: CARMINO OCAIN, male   DOB: 09/22/1924, 79 y.o.   MRN: [id number]   SUBJECTIVE: Distended bladder yesterday, foley placed by urology with > 2000 cc return.  Creatinine improved this morning.  Patient says he feels better overall, no complaint of dyspnea.  HR controlled.     [name] amiodarone  200 mg Oral BID  . antiseptic oral rinse  15 mL Mouth Rinse BID  . atorvastatin  20 mg Oral q1800  . buPROPion  75 mg Oral BID WC  . clonazePAM  0.5 mg Oral Daily  . finasteride  5 mg Oral Daily  . metoprolol succinate  50 mg Oral BID  . sodium chloride  3 mL Intravenous Q12H  . Warfarin - Pharmacist Dosing Inpatient   Does not apply q1800    Filed Vitals:   12/17/12 1412 12/17/12 1942 12/17/12 2048 12/18/12 0518  BP: 106/68 112/69 109/71 118/72  Pulse: 75 74 77 84  Temp: 97.8 F (36.6 C)  97.9 F (36.6 C) 97.8 F (36.6 C)  TempSrc: Oral  Oral Oral  Resp: 20  20 20   Height:      Weight:    188 lb 0.8 oz (85.3 kg)  SpO2: 97%  96% 96%    Intake/Output Summary (Last 24 hours) at 12/18/12 0751 Last data filed at 12/18/12 0700  Gross per 24 hour  Intake   1080 ml  Output   3800 ml  Net  -2720 ml    LABS: Basic Metabolic Panel:  Recent Labs  40/98/11 0452 12/18/12 0600  NA 143 146*  K 4.8 4.4  CL 108 113*  CO2 23 26  GLUCOSE 91 105*  BUN 80* 64*  CREATININE 4.17* 2.49*  CALCIUM 8.9 9.1   Liver Function Tests: No results found for this basename: AST, ALT, ALKPHOS, BILITOT, PROT, ALBUMIN,  in the last 72 hours No results found for this basename: LIPASE, AMYLASE,  in the last 72 hours CBC:  Recent Labs  12/17/12 0452 12/18/12 0600  WBC 13.3* 13.1*  HGB 11.6* 11.7*  HCT 36.5* 36.1*  MCV 85.9 84.9  PLT 243 252   Cardiac Enzymes: No results found for this basename: CKTOTAL, CKMB, CKMBINDEX, TROPONINI,  in the last 72 hours BNP: No components found with this basename: POCBNP,  D-Dimer: No results found for this basename: DDIMER,  in the last 72  hours Hemoglobin A1C: No results found for this basename: HGBA1C,  in the last 72 hours Fasting Lipid Panel: No results found for this basename: CHOL, HDL, LDLCALC, TRIG, CHOLHDL, LDLDIRECT,  in the last 72 hours Thyroid Function Tests: No results found for this basename: TSH, T4TOTAL, FREET3, T3FREE, THYROIDAB,  in the last 72 hours Anemia Panel: No results found for this basename: VITAMINB12, FOLATE, FERRITIN, TIBC, IRON, RETICCTPCT,  in the last 72 hours  RADIOLOGY: Dg Chest Portable 1 View  12/06/2012  *RADIOLOGY REPORT*  Clinical Data: Shortness of breath.  PORTABLE CHEST - 1 VIEW  Comparison: 01/20/2012.  Findings: The heart is enlarged.  There is tortuosity and calcification of the thoracic aorta.  There is vascular congestion, asymmetric pulmonary edema and bilateral pleural effusions consistent with CHF.  IMPRESSION: Congestive heart failure.   Original Report Authenticated By: Rudie Meyer, M.D.     PHYSICAL EXAM General: NAD Neck: JVP 8, no thyromegaly or thyroid nodule.  Lungs: Clear to auscultation bilaterally with normal respiratory effort. CV: Nondisplaced PMI.  Heart irregular S1/S2, no S3/S4, 2/6 HSM at apex.  No edema.  Abdomen: Soft, nontender, no hepatosplenomegaly, no distention.  Neurologic: Alert and oriented x 3.  Psych: Normal affect. Extremities: No clubbing or cyanosis.   TELEMETRY: Reviewed telemetry pt in atrial fibrillation in 80s-90s  ASSESSMENT AND PLAN: 79 yo with history of depression/anxiety admitted with atrial fibrillation/RVR and acute systolic CHF. 1. Atrial fibrillation: HR 80s-90s this morning.  EF was 25-30% on echo. 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  Unable to cardiovert due to possible LAA thrombus on TEE.  - Continue amiodarone and Toprol XL.  - He is now on coumadin rather than NOAC due to fluctuations in creatinine.  - After 1 month of therapeutic INR, would plan DCCV.   2. CHF: Acute systolic, EF 25-30%.  Diffuse hypokinesis with some regionality in the basal inferior and inferoseptal walls. 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  LHC showed a long 60% proximal LAD stenosis but I do not think this caused his cardiomyopathy.  He does not seem significantly volume overloaded at this point. - Continue Toprol XL - No ACEI with AKI. 3. CAD: Moderate CAD with 60% long proximal LAD stenosis.  He is on statin and anticoagulated. 4. AKI: Probably post-renal with obstruction likely at prostate level.  Urology placed foley with good UOP.  Creatinine now falling.  Continue to monitor.  On finasteride per urology, will need foley until he follows up as outpatient with urology. 5. Disposition: Will need SNF placement.  I encouraged him to participate with PT.  He is very weak.   [name] 12/18/2012

## 2012-12-18 NOTE — Progress Notes (Signed)
ANTICOAGULATION CONSULT NOTE - Follow Up Consult  Pharmacy Consult for Coumadin Indication: atrial fibrillation and possible LAA thrombus   Allergies  Allergen Reactions  . Darifenacin Hydrobromide     REACTION: constipation  . Fluoxetine Hcl     REACTION: Very Depressed while taking    Patient Measurements: Height: 6' (182.9 cm) Weight: 188 lb 0.8 oz (85.3 kg) IBW/kg (Calculated) : 77.6  Vital Signs: Temp: 97.8 F (36.6 C) (04/30 0518) Temp src: Oral (04/30 0518) BP: 118/72 mmHg (04/30 0518) Pulse Rate: 84 (04/30 0518)  Labs:  Recent Labs  12/16/12 0615 12/17/12 0452 12/18/12 0600  HGB 11.3* 11.6* 11.7*  HCT 35.8* 36.5* 36.1*  PLT 250 243 252  LABPROT  --  22.4* 23.0*  INR  --  2.06* 2.14*  CREATININE 2.69* 4.17* 2.49*    Estimated Creatinine Clearance: 23.4 ml/min (by C-G formula based on Cr of 2.49).  Assessment: 33 yof previously on apixaban but changed back to coumadin on 4/29 due to worsening renal function. Her last dose was 4/28 PM. INR today is therapeutic at 2.14 but this is impacted by apixaban dosing. CBC is stable, no bleeding noted.  Goal of Therapy:  INR 2-3   Plan:  1. Repeat coumadin 5mg  PO x 1 tonight 2. F/u AM INR  Lysle Pearl, PharmD, BCPS Pager # [phone] 12/18/2012 8:52 AM

## 2012-12-18 NOTE — Clinical Social Work Note (Signed)
CSW reviewed chart and reviewed pt with RNCM.  CSW will continue to follow.  Vickii Penna, LCSWA [phone]  Clinical Social Work

## 2012-12-18 NOTE — Progress Notes (Signed)
PT Cancellation Note  Patient Details Name: [name] MRN: [id number] DOB: 12-Jan-1925   Cancelled Treatment:     Pt adamantly refusing to participate in therapy at this time.      Verdell Face, [place] [phone] 12/18/2012

## 2012-12-18 NOTE — Progress Notes (Signed)
Physical Therapy Treatment Patient Details Name: [name] MRN: [id number] DOB: 26-Mar-1925 Today's Date: 12/18/2012 Time: 7829-5621 PT Time Calculation (min): 28 min  PT Assessment / Plan / Recommendation Comments on Treatment Session  Initially refusing therapy for the day but then reattempted ~30 mins prior to lunch & with strong encouragement pt finally agreeable to participate in therapy with the promise of that Nsing would assist him back to bed right after lunch.      Follow Up Recommendations  SNF     Does the patient have the potential to tolerate intense rehabilitation     Barriers to Discharge        Equipment Recommendations  Rolling walker with 5" wheels    Recommendations for Other Services    Frequency Min 3X/week   Plan Discharge plan remains appropriate    Precautions / Restrictions Precautions Precautions: Fall Precaution Comments: pt wears a brace on L knee 2/2 needing TKA, however per pt he is too old for surgery.   Restrictions Weight Bearing Restrictions: No       Mobility  Bed Mobility Bed Mobility: Supine to Sit;Sitting - Scoot to Edge of Bed Supine to Sit: 4: Min assist;With rails;HOB elevated Sitting - Scoot to Edge of Bed: 4: Min guard Details for Bed Mobility Assistance: Cues for technique, increased use of UE's.  Pt requires increased time to perform.   Transfers Transfers: Sit to Stand;Stand to Sit Sit to Stand: 3: Mod assist;With upper extremity assist;With armrests;From bed;From chair/3-in-1 Stand to Sit: 4: Min assist;With upper extremity assist;With armrests;To chair/3-in-1 Details for Transfer Assistance: Cues for hand placement & technique.  (A) for balance due to posterior lean but was improved compared to last PT session.  Slow to achieve complete upright posture.   Ambulation/Gait Ambulation/Gait Assistance: 4: Min assist Ambulation Distance (Feet): 100 Feet Assistive device: Rolling walker Ambulation/Gait Assistance Details:  lengthy seated rest break x 1.  Pt ambulated on RA. 02 sats remained >90% but pt did c/o SOB at times.  Cues for safe use of RW.  Pt with slow, small steps.  Mild posterior lean on heels but improved with distance.   Gait Pattern: Step-through pattern;Decreased stride length;Trunk flexed (decreased floor clearance.  ) Gait velocity: decreased Stairs: No Wheelchair Mobility Wheelchair Mobility: No      PT Goals Acute Rehab PT Goals Time For Goal Achievement: 12/26/12 Potential to Achieve Goals: Good Pt will go Supine/Side to Sit: with modified independence PT Goal: Supine/Side to Sit - Progress: Progressing toward goal Pt will go Sit to Supine/Side: with modified independence Pt will go Sit to Stand: with supervision PT Goal: Sit to Stand - Progress: Progressing toward goal Pt will go Stand to Sit: with supervision PT Goal: Stand to Sit - Progress: Progressing toward goal Pt will Ambulate: >150 feet;with supervision;with rolling walker PT Goal: Ambulate - Progress: Progressing toward goal Pt will Go Up / Down Stairs: 3-5 stairs;with min assist;with least restrictive assistive device  Visit Information  Last PT Received On: 12/18/12 Assistance Needed: +2 (safety)    Subjective Data  Subjective: Pt very negative & requires strong encouragement to participate in therapy   Cognition  Cognition Arousal/Alertness: Awake/alert Behavior During Therapy: WFL for tasks assessed/performed Overall Cognitive Status: Within Functional Limits for tasks assessed    Balance     End of Session PT - End of Session Equipment Utilized During Treatment: Gait belt Patient left: in chair;with call bell/phone within reach Nurse Communication: Mobility status  Verdell Face, [place] [phone] 12/18/2012

## 2012-12-19 LAB — BASIC METABOLIC PANEL
BUN: 40 mg/dL — ABNORMAL HIGH (ref 6–23)
Chloride: 109 mEq/L (ref 96–112)
GFR calc Af Amer: 58 mL/min — ABNORMAL LOW (ref >90)
Potassium: 4 mEq/L (ref 3.5–5.1)

## 2012-12-19 LAB — PROTIME-INR
INR: 2.25 — ABNORMAL HIGH (ref 0.00–1.49)
Prothrombin Time: 23.9 seconds — ABNORMAL HIGH (ref 11.6–15.2)

## 2012-12-19 LAB — CBC
HCT: 34.1 % — ABNORMAL LOW (ref 39.0–52.0)
Platelets: 248 10*3/uL (ref 150–400)
RDW: 16.2 % — ABNORMAL HIGH (ref 11.5–15.5)
WBC: 11.3 10*3/uL — ABNORMAL HIGH (ref 4.0–10.5)

## 2012-12-19 MED ORDER — METOPROLOL SUCCINATE ER 50 MG PO TB24
75.0000 mg | ORAL_TABLET | Freq: Two times a day (BID) | ORAL | Status: DC
  Administered 2012-12-19: 75 mg via ORAL
  Filled 2012-12-19 (×4): qty 1

## 2012-12-19 MED ORDER — TAMSULOSIN HCL 0.4 MG PO CAPS
0.4000 mg | ORAL_CAPSULE | Freq: Every day | ORAL | Status: DC
  Administered 2012-12-19 – 2012-12-20 (×2): 0.4 mg via ORAL
  Filled 2012-12-19 (×3): qty 1

## 2012-12-19 MED ORDER — WARFARIN SODIUM 5 MG PO TABS
5.0000 mg | ORAL_TABLET | Freq: Once | ORAL | Status: AC
  Administered 2012-12-19: 5 mg via ORAL
  Filled 2012-12-19: qty 1

## 2012-12-19 MED ORDER — CIPROFLOXACIN HCL 250 MG PO TABS
250.0000 mg | ORAL_TABLET | Freq: Two times a day (BID) | ORAL | Status: DC
  Administered 2012-12-19 – 2012-12-20 (×3): 250 mg via ORAL
  Filled 2012-12-19 (×5): qty 1

## 2012-12-19 NOTE — Progress Notes (Signed)
Occupational Therapy Treatment Patient Details Name: [name] MRN: [id number] DOB: February 12, 1925 Today's Date: 12/19/2012 Time:  0900- 0930    OT Assessment / Plan / Recommendation Comments on Treatment Session Pt has progressed since last session.  Pt still needs Max encouragement to participate in therapy.  Pt became sad while walking to sink stating he did not want to see himself look this bad.  Pt was encouraged by OTAS and PTA and proceeded to complete task and the remainder of the session    Follow Up Recommendations  SNF       Equipment Recommendations  None recommended by OT       Frequency Min 2X/week   Plan Discharge plan remains appropriate    Precautions / Restrictions Precautions Precautions: Fall Precaution Comments: pt wears a brace on L knee 2/2 needing TKA, however per pt he is too old for surgery.   Restrictions Weight Bearing Restrictions: No       ADL  Grooming: Performed;Wash/dry face;Minimal assistance;Wash/dry hands Where Assessed - Grooming:  (right hand supported on sink, left hand washing face) Lower Body Bathing: Simulated;+1 Total assistance Lower Body Dressing: Performed;+1 Total assistance Toilet Transfer: Minimal assistance Toilet Transfer Method: Sit to stand Toilet Transfer Equipment:  (bed>sink>hallway>recliner>bed) Equipment Used: Gait belt;Rolling walker Transfers/Ambulation Related to ADLs: Pt ambulation has progressed needed Min A with no rest breaks ADL Comments: While donning socks pt needed OTAS to place socks on over toes for his right foot needed total A for left foot. Pt was able to stand at sink unsupported while washing face.  Pt was also able to reach out of BOS to obtain papertowels      OT Goals ADL Goals ADL Goal: Grooming - Progress: Progressing toward goals ADL Goal: Lower Body Bathing - Progress: Not progressing (decreased since eval) ADL Goal: Toilet Transfer - Progress: Progressing toward goals Miscellaneous OT  Goals OT Goal: Miscellaneous Goal #1 - Progress: Progressing toward goals  Visit Information  Assistance Needed: +1 PT/OT Co-Evaluation/Treatment: Yes          Cognition  Cognition Arousal/Alertness: Awake/alert Behavior During Therapy: WFL for tasks assessed/performed Overall Cognitive Status: Within Functional Limits for tasks assessed    Mobility  Bed Mobility Bed Mobility: Supine to Sit;Sitting - Scoot to Edge of Bed Supine to Sit: 4: Min assist;With rails;HOB elevated Sitting - Scoot to [place] of Bed: 4: Min guard Sit to Supine: 4: Min assist;HOB flat Details for Bed Mobility Assistance: Cues for sequencing & technique.  Pt slow to process cues & sequencing.  Reached across with R UE for rail on Lt side then able to assist with pushing up with Lt forearm/elbow into bed.  (A) to complete bringing up shoulders/trunk to sitting upright.   Transfers Transfers: Sit to Stand;Stand to Sit Sit to Stand: 4: Min assist;3: Mod assist;With upper extremity assist;With armrests;From bed;From chair/3-in-1 Stand to Sit: 4: Min assist;With upper extremity assist;With armrests;To chair/3-in-1 Details for Transfer Assistance: Mod (A) to achieve standing from recliner vs Min (A) to stand from bed.  Cues for hand placement & technique.  Required min/mod (A) for balance with standing from recliner due to strong posterior lean-- pt with difficulty shifting weight forwards adequately enough to correct his balance.         Balance Balance Balance Assessed: Yes Dynamic Sitting Balance Dynamic Sitting - Balance Support: Feet supported;During functional activity Dynamic Sitting - Level of Assistance:  (Supervision/safety) Dynamic Sitting - Comments: donning socks Dynamic Standing Balance Dynamic Standing -  Balance Support: During functional activity;Left upper extremity supported;Right upper extremity supported Dynamic Standing - Level of Assistance: 4: Min assist Dynamic Standing - Balance Activities:  Reaching for objects;Lateral lean/weight shifting;Forward lean/weight shifting;Reaching across midline Dynamic Standing - Comments: Min (A) to maintain balance.  LOB forwards.  Performed functional activities standing at sink and reaching for different objects-- unilateral UE support with reaching activities.     End of Session OT - End of Session Equipment Utilized During Treatment: Gait belt Activity Tolerance: Patient limited by fatigue Patient left: in bed;with call bell/phone within reach Nurse Communication: Mobility status       Dietrich Pates 12/19/2012, 1:47 PM

## 2012-12-19 NOTE — Progress Notes (Signed)
CSW spoke multiple times at great length with patient's daughter Olegario Messier.  [facility] cannot offer a bed to patient as they are not in network with patient's [facility]- Medicare Complete. Patient does not have any out of network benefits either.  Facility offered patient a private pay bed if they chose.  Daughter wants to discuss with her parents and will also consider other bed offers provided.  CSW will monitor and follow up.  [name]. [place]  [phone]

## 2012-12-19 NOTE — Progress Notes (Signed)
ANTICOAGULATION CONSULT NOTE - Follow Up Consult  Pharmacy Consult for Coumadin Indication: atrial fibrillation and possilbe LAA thrombus  Allergies  Allergen Reactions  . Darifenacin Hydrobromide     REACTION: constipation  . Fluoxetine Hcl     REACTION: Very Depressed while taking    Patient Measurements: Height: 6' (182.9 cm) Weight: 188 lb 4.4 oz (85.4 kg) IBW/kg (Calculated) : 77.6  Vital Signs: Temp: 97.6 F (36.4 C) (05/01 0446) Temp src: Oral (05/01 0446) BP: 118/66 mmHg (05/01 0446) Pulse Rate: 93 (05/01 0446)  Labs:  Recent Labs  12/17/12 0452 12/18/12 0600 12/19/12 0515  HGB 11.6* 11.7* 11.0*  HCT 36.5* 36.1* 34.1*  PLT 243 252 248  LABPROT 22.4* 23.0* 23.9*  INR 2.06* 2.14* 2.25*  CREATININE 4.17* 2.49* 1.26    Estimated Creatinine Clearance: 46.2 ml/min (by C-G formula based on Cr of 1.26).  Assessment:   INR remains therapeutic after Coumadin 5 mg x 2 doses, since changed back from Eliquis on 4/29 due to worsening renal function. Foley placed 4/29, 2L out, Proscar added.  Scr now down.   Cipro 250 mg PO BID added today and planned x 2 weeks. Sometimes effects Coumadin dosing. Foley to stay in place for at least a few weeks, per Urology. Urine culture pending.    Goal of Therapy:  INR 2-3 Monitor platelets by anticoagulation protocol: Yes   Plan:   Coumadin 5 mg again today. 3rd dose since resumed.  Daily PT/INR.   Will need to watch for possible Cipro effect on PT/INR after a few days.  Dennie Fetters, [place] Pager: [phone] 12/19/2012,12:37 PM

## 2012-12-19 NOTE — Progress Notes (Signed)
8 Days Post-Op  Subjective: 1 - Urinary Retention / Difficult Foley - Pt with moderate baseline obstructive symtpoms on Doxazosin per PCP, no episodes of frank retention.Nursing with difficulty placing catheter. Urology placed at bedside 4/29 with 2L output.  PSA 2.4.  2 - Acute Renal Failure - Baseline Cr 1's. Had rapid rise to 4's over past few days. Renal US with mild bilateral hydro to level of distended bladder. Since foley placed Cr now back to 1's.   3 - Rt Renal Cysts - Pt with 8cm and 6cm minimally complex Rt renal cysts incidental on renal US performed for acute renal failure.    Objective: Vital signs in last 24 hours: Temp:  [97.6 F (36.4 C)-97.8 F (36.6 C)] 97.6 F (36.4 C) (05/01 1354) Pulse Rate:  [79-97] 79 (05/01 1354) Resp:  [20] 20 (05/01 1354) BP: (88-126)/(53-66) 88/53 mmHg (05/01 1354) SpO2:  [94 %-97 %] 97 % (05/01 1354) Weight:  [85.4 kg (188 lb 4.4 oz)] 85.4 kg (188 lb 4.4 oz) (05/01 0446) Last BM Date: 12/15/12  Intake/Output from previous day: 04/30 0701 - 05/01 0700 In: 960 [P.O.:960] Out: 3175 [Urine:3175] Intake/Output this shift: Total I/O In: 240 [P.O.:240] Out: 450 [Urine:450]  General appearance: alert, cooperative and appears stated age Head: Normocephalic, without obvious abnormality, atraumatic Eyes: conjunctivae/corneas clear. PERRL, EOM's intact. Fundi benign. Ears: normal TM's and external ear canals both ears Nose: Nares normal. Septum midline. Mucosa normal. No drainage or sinus tenderness. Throat: lips, mucosa, and tongue normal; teeth and gums normal Neck: no adenopathy, no carotid bruit, no JVD, supple, symmetrical, trachea midline and thyroid not enlarged, symmetric, no tenderness/mass/nodules Back: symmetric, no curvature. ROM normal. No CVA tenderness. Resp: clear to auscultation bilaterally Chest wall: no tenderness Cardio: regular rate and rhythm, S1, S2 normal, no murmur, click, rub or gallop GI: soft, non-tender; bowel  sounds normal; no masses,  no organomegaly Male genitalia: normal, Foley c/d/i with yellow urine. Extremities: venous stasis dermatitis noted Pulses: 2+ and symmetric Skin: Skin color, texture, turgor normal. No rashes or lesions Lymph nodes: Cervical, supraclavicular, and axillary nodes normal. Neurologic: Grossly normal  Lab Results:   Recent Labs  12/18/12 0600 12/19/12 0515  WBC 13.1* 11.3*  HGB 11.7* 11.0*  HCT 36.1* 34.1*  PLT 252 248   BMET  Recent Labs  12/18/12 0600 12/19/12 0515  NA 146* 145  K 4.4 4.0  CL 113* 109  CO2 26 27  GLUCOSE 105* 99  BUN 64* 40*  CREATININE 2.49* 1.26  CALCIUM 9.1 8.9   PT/INR  Recent Labs  12/18/12 0600 12/19/12 0515  LABPROT 23.0* 23.9*  INR 2.14* 2.25*   ABG No results found for this basename: PHART, PCO2, PO2, HCO3,  in the last 72 hours  Studies/Results: US Renal  12/17/2012  *RADIOLOGY REPORT*  Clinical Data: Acute renal insufficiency after cardiac catheterization.  RENAL/URINARY TRACT ULTRASOUND COMPLETE  Comparison:  10/22/2006  Findings:  Right Kidney:  15.2 cm.  Mild to moderate hydronephrosis.  Upper pole right renal cyst measures 8.4 cm and is enlarged since the prior exam.  There is an interpolar right renal 6.9 cm cyst.  This may have complexity in its inferior portion on image 19. Alternatively this apparent complexity could be artifactual. Normal renal cortical thickness for age.  Left Kidney:  12.6 cm.  Normal renal cortical thickness for age. Minimal pelvicaliectasis.  Favored to be a secondary to the extent of bladder distention.  Bladder:  Borderline bladder distention.  Bilateral ureteric jets  identified.  IMPRESSION:  1.  Mild-moderate right and minimal left-sided hydronephrosis. Left-sided pelvicaliectasis is favored to be within normal variation, given the extent of borderline bladder distention. Right sided hydronephrosis is without identifiable cause.  Consider further characterization with CT. 2.  Right  renal lesions.  Dominant upper pole lesion which is likely a cyst.  An interpolar lesion may have complexity in its inferior portion.  This also would be better evaluated with CT.  If the patient cannot receive CT contrast, unenhanced non emergent MRI may also be informative.   Original Report Authenticated By: Jeronimo Greaves, M.D.     Anti-infectives: Anti-infectives   Start     Dose/Rate Route Frequency Ordered Stop   12/19/12 0830  ciprofloxacin (CIPRO) tablet 250 mg     250 mg Oral 2 times daily 12/19/12 4782        Assessment/Plan: 1 - Urinary Retention / Difficult Foley - PSA normal, presumed BPH. Continue finasteride + tamsulosin at discharge. I will arrange for outpatient trial of void at [facility] in 2-3 weeks. Catheter to remain at discharge.   2 - Acute Renal Failure - Now resolving s/p foley and relief of outlet obstruction.   3 - Rt Renal Cysts - Again , would NOT characterize further with additional imaging as he is not candidate for any therapy.  4 - Will sign off. Call with questions.   [facility], Makaelah Cranfield 12/19/2012

## 2012-12-19 NOTE — Progress Notes (Signed)
Physical Therapy Treatment Patient Details Name: [name] MRN: [id number] DOB: February 09, 1925 Today's Date: 12/19/2012 Time: 4098-1191 PT Time Calculation (min): 34 min  PT Assessment / Plan / Recommendation Comments on Treatment Session  Pt much more agreeable to participate without encouragement today.   Making steady progress with mobility but would cont to strongly recommend ST-SNF to maximize funcitonal recovery.   Pt returned back to bed at end of session with NT aware & stating she will assist pt recliner for meals.      Follow Up Recommendations  SNF     Does the patient have the potential to tolerate intense rehabilitation     Barriers to Discharge        Equipment Recommendations  Rolling walker with 5" wheels    Recommendations for Other Services    Frequency Min 3X/week   Plan Discharge plan remains appropriate    Precautions / Restrictions Precautions Precautions: Fall Restrictions Weight Bearing Restrictions: No       Mobility  Bed Mobility Bed Mobility: Supine to Sit;Sitting - Scoot to Edge of Bed Supine to Sit: 4: Min assist;With rails;HOB elevated Sitting - Scoot to [place] of Bed: 4: Min guard Sit to Supine: 4: Min assist;HOB flat Details for Bed Mobility Assistance: Cues for sequencing & technique.  Pt slow to process cues & sequencing.  Reached across with R UE for rail on Lt side then able to assist with pushing up with Lt forearm/elbow into bed.  (A) to complete bringing up shoulders/trunk to sitting upright.   Transfers Transfers: Sit to Stand;Stand to Sit Sit to Stand: 4: Min assist;3: Mod assist;With upper extremity assist;With armrests;From bed;From chair/3-in-1 Stand to Sit: 4: Min assist;With upper extremity assist;With armrests;To chair/3-in-1 Details for Transfer Assistance: Mod (A) to achieve standing from recliner vs Min (A) to stand from bed.  Cues for hand placement & technique.  Required min/mod (A) for balance with standing from recliner due  to strong posterior lean-- pt with difficulty shifting weight forwards adequately enough to correct his balance.   Ambulation/Gait Ambulation/Gait Assistance: 4: Min guard Ambulation Distance (Feet): 120 Feet Assistive device: Rolling walker Ambulation/Gait Assistance Details: Pt able to ambulate 120' without seated rest break however towards end of distance pt began to report UE's feeling very weak & "giving out".    Cues for tall posture & to stay inside RW.  Pt with rounded shoulders, & cervical flexed posture.   Gait Pattern: Step-through pattern;Decreased stride length (decreased step height.  ) Gait velocity: decreased Stairs: No Wheelchair Mobility Wheelchair Mobility: No      PT Goals Acute Rehab PT Goals Time For Goal Achievement: 12/26/12 Potential to Achieve Goals: Good Pt will go Supine/Side to Sit: with modified independence PT Goal: Supine/Side to Sit - Progress: Progressing toward goal Pt will go Sit to Supine/Side: with modified independence PT Goal: Sit to Supine/Side - Progress: Progressing toward goal Pt will go Sit to Stand: with supervision PT Goal: Sit to Stand - Progress: Progressing toward goal Pt will go Stand to Sit: with supervision PT Goal: Stand to Sit - Progress: Progressing toward goal Pt will Ambulate: >150 feet;with supervision;with rolling walker PT Goal: Ambulate - Progress: Progressing toward goal Pt will Go Up / Down Stairs: 3-5 stairs;with min assist;with least restrictive assistive device  Visit Information  Last PT Received On: 12/19/12 Assistance Needed: +1    Subjective Data      Cognition  Cognition Arousal/Alertness: Awake/alert Behavior During Therapy: [facility] for tasks assessed/performed Overall  Cognitive Status: Within Functional Limits for tasks assessed    Balance  Balance Balance Assessed: Yes Dynamic Sitting Balance Dynamic Sitting - Balance Support: Feet supported;During functional activity Dynamic Sitting - Level of  Assistance:  (Supervision/safety) Dynamic Sitting - Comments: donning socks Dynamic Standing Balance Dynamic Standing - Balance Support: During functional activity;Left upper extremity supported;Right upper extremity supported Dynamic Standing - Level of Assistance: 4: Min assist Dynamic Standing - Balance Activities: Reaching for objects;Lateral lean/weight shifting;Forward lean/weight shifting;Reaching across midline Dynamic Standing - Comments: Min (A) to maintain balance.  LOB forwards.  Performed functional activities standing at sink and reaching for different objects-- unilateral UE support with reaching activities.    End of Session PT - End of Session Equipment Utilized During Treatment: Gait belt Activity Tolerance: Patient tolerated treatment well Patient left: with call bell/phone within reach;in bed Nurse Communication: Mobility status     Verdell Face, [place] [phone] 12/19/2012

## 2012-12-19 NOTE — Progress Notes (Addendum)
Patient ID: [name], male   DOB: May 09, 1925, 79 y.o.   MRN: [id number]    SUBJECTIVE: Creatinine continues to improve with brisk UOP.  No dyspnea.  Feels "bad" because of poor sleep.  Did walk with PT yesterday.    [name] amiodarone  200 mg Oral BID  . antiseptic oral rinse  15 mL Mouth Rinse BID  . atorvastatin  20 mg Oral q1800  . buPROPion  75 mg Oral BID WC  . clonazePAM  0.5 mg Oral Daily  . finasteride  5 mg Oral Daily  . metoprolol succinate  75 mg Oral BID  . sodium chloride  3 mL Intravenous Q12H  . Warfarin - Pharmacist Dosing Inpatient   Does not apply q1800    Filed Vitals:   12/18/12 1500 12/18/12 2033 12/19/12 0341 12/19/12 0446  BP: 120/60 126/58  118/66  Pulse: 91 97 90 93  Temp:  97.8 F (36.6 C)  97.6 F (36.4 C)  TempSrc:  Oral  Oral  Resp: 20 20  20   Height:      Weight:    188 lb 4.4 oz (85.4 kg)  SpO2: 92% 94% 95% 96%    Intake/Output Summary (Last 24 hours) at 12/19/12 0817 Last data filed at 12/19/12 0617  Gross per 24 hour  Intake    960 ml  Output   3175 ml  Net  -2215 ml    LABS: Basic Metabolic Panel:  Recent Labs  95/62/13 0600 12/19/12 0515  NA 146* 145  K 4.4 4.0  CL 113* 109  CO2 26 27  GLUCOSE 105* 99  BUN 64* 40*  CREATININE 2.49* 1.26  CALCIUM 9.1 8.9   Liver Function Tests: No results found for this basename: AST, ALT, ALKPHOS, BILITOT, PROT, ALBUMIN,  in the last 72 hours No results found for this basename: LIPASE, AMYLASE,  in the last 72 hours CBC:  Recent Labs  12/18/12 0600 12/19/12 0515  WBC 13.1* 11.3*  HGB 11.7* 11.0*  HCT 36.1* 34.1*  MCV 84.9 85.0  PLT 252 248   Cardiac Enzymes: No results found for this basename: CKTOTAL, CKMB, CKMBINDEX, TROPONINI,  in the last 72 hours BNP: No components found with this basename: POCBNP,  D-Dimer: No results found for this basename: DDIMER,  in the last 72 hours Hemoglobin A1C: No results found for this basename: HGBA1C,  in the last 72 hours Fasting Lipid  Panel: No results found for this basename: CHOL, HDL, LDLCALC, TRIG, CHOLHDL, LDLDIRECT,  in the last 72 hours Thyroid Function Tests: No results found for this basename: TSH, T4TOTAL, FREET3, T3FREE, THYROIDAB,  in the last 72 hours Anemia Panel: No results found for this basename: VITAMINB12, FOLATE, FERRITIN, TIBC, IRON, RETICCTPCT,  in the last 72 hours  RADIOLOGY: Dg Chest Portable 1 View  12/06/2012  *RADIOLOGY REPORT*  Clinical Data: Shortness of breath.  PORTABLE CHEST - 1 VIEW  Comparison: 01/20/2012.  Findings: The heart is enlarged.  There is tortuosity and calcification of the thoracic aorta.  There is vascular congestion, asymmetric pulmonary edema and bilateral pleural effusions consistent with CHF.  IMPRESSION: Congestive heart failure.   Original Report Authenticated By: Rudie Meyer, M.D.     PHYSICAL EXAM General: NAD Neck: JVP 8, no thyromegaly or thyroid nodule.  Lungs: Clear to auscultation bilaterally with normal respiratory effort. CV: Nondisplaced PMI.  Heart irregular S1/S2, no S3/S4, 2/6 HSM at apex.  No edema.  Abdomen: Soft, nontender, no hepatosplenomegaly, no distention.  Neurologic: Alert  and oriented x 3.  Psych: Normal affect. Extremities: No clubbing or cyanosis.   TELEMETRY: Reviewed telemetry pt in atrial fibrillation in 80s-90s  ASSESSMENT AND PLAN: 79 yo with history of depression/anxiety admitted with atrial fibrillation/RVR and acute systolic CHF. 1. Atrial fibrillation: HR 80s-90s this morning.  EF was 25-30% on echo. 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  Unable to cardiovert due to possible LAA thrombus on TEE.  - Continue amiodarone and Toprol XL.  - He is now on coumadin rather than NOAC due to fluctuations in creatinine.  - After 1 month of therapeutic INR, would plan DCCV.  2. CHF: Acute systolic, EF 25-30%.  Diffuse hypokinesis with some regionality in the basal inferior and  inferoseptal walls. 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  LHC showed a long 60% proximal LAD stenosis but I do not think this caused his cardiomyopathy.  He does not seem significantly volume overloaded at this point. - Continue Toprol XL - No ACEI with AKI. 3. CAD: Moderate CAD with 60% long proximal LAD stenosis.  He is on statin and anticoagulated. 4. AKI: Probably post-renal with obstruction likely at prostate level.  Urology placed foley with good UOP.  Creatinine now falling.  Continue to monitor.  On finasteride per urology, will need foley until he follows up as outpatient with urology. 5. UTI: Start Cipro.  Given decreased GFR, will use 250 q12 x 2 wks for complicated UTI (has foley). 6. Disposition: Will need SNF placement => potentially tomorrow.  I encouraged him to participate with PT.  He is very weak.   [name] 12/19/2012

## 2012-12-19 NOTE — Clinical Social Work Psychosocial (Signed)
     Clinical Social Work Department BRIEF PSYCHOSOCIAL ASSESSMENT 12/19/2012  Patient:  [name], [name]     Account Number:  [id number]     Admit date:  12/06/2012  Clinical Social Worker:  Tiburcio Pea  Date/Time:  12/13/2012 05:15 PM  Referred by:  Physician  Date Referred:  12/13/2012 Referred for  SNF Placement   Other Referral:   Interview type:  Other - See comment Other interview type:   Patient, wife and daughter Xzavian Semmel    PSYCHOSOCIAL DATA Living Status:  WIFE Admitted from facility:   Level of care:   Primary support name:  Michail Boyte  (c) [phone] Primary support relationship to patient:  CHILD, ADULT Degree of support available:   Strong support    * Wife:  Aryaan Persichetti  [phone]    CURRENT CONCERNS Current Concerns  Post-Acute Placement   Other Concerns:    SOCIAL WORK ASSESSMENT / PLAN CSW met with patient, wife and daughter today to discuss Physical Therapy's recommendation of short term SNF. Patient was, at first, completely refusing SNF but after consideration- agreed to short term SNF if he coudl go to the [address] and [employer].  Fl2 will be completed and sent to [facility] and surrounding facilities for review. Daughter and wife also want [facility] and feel very strongly about this.  FL2 to be placed on chart for MD's signature.   Assessment/plan status:  Psychosocial Support/Ongoing Assessment of Needs Other assessment/ plan:   Information/referral to community resources:   SNF bed list provided to patient/ family    Discussed HH and private duty care    PATIENTS/FAMILYS RESPONSE TO PLAN OF CARE: Pateint is alert and oriented;  he is aware and agreeable to short term SNF and strongly wants placement at the [facility]  Per patient's daughter- Mrs. Nettleton has been showing increasing memory loss issues and [facility] is close to where they live. Daughter Olegario Messier is very supportive but she lives out of town.  CSW will assist with placement.

## 2012-12-19 NOTE — Progress Notes (Signed)
Patient ID: [name], male   DOB: Jan 21, 1925, 79 y.o.   MRN: [id number] S:feels better O:BP 118/66  Pulse 93  Temp(Src) 97.6 F (36.4 C) (Oral)  Resp 20  Ht 6' (1.829 m)  Wt 85.4 kg (188 lb 4.4 oz)  BMI 25.53 kg/m2  SpO2 96%  Intake/Output Summary (Last 24 hours) at 12/19/12 1052 Last data filed at 12/19/12 0617  Gross per 24 hour  Intake    600 ml  Output   2525 ml  Net  -1925 ml   Intake/Output: I/O last 3 completed shifts: In: 1200 [P.O.:1200] Out: 4825 [Urine:4825]  Intake/Output this shift:    Weight change: 0.1 kg (3.5 oz) WGN:FAOZH, elderly WM in NAD CVS:no rub, IRR IRR Resp:decrease resp effort YQM:VHQION Ext:no edema   Recent Labs Lab 12/14/12 0630 12/14/12 2355 12/15/12 0500 12/16/12 0615 12/17/12 0452 12/18/12 0600 12/19/12 0515  NA 143 143 145 144 143 146* 145  K 3.8 4.0 4.2 4.1 4.8 4.4 4.0  CL 105 109 110 108 108 113* 109  CO2 28 26 27 27 23 26 27   GLUCOSE 115* 99 101* 116* 91 105* 99  BUN 58* 46* 44* 59* 80* 64* 40*  CREATININE 2.94* 1.70* 1.73* 2.69* 4.17* 2.49* 1.26  CALCIUM 9.2 8.9 9.1 9.2 8.9 9.1 8.9   Liver Function Tests: No results found for this basename: AST, ALT, ALKPHOS, BILITOT, PROT, ALBUMIN,  in the last 168 hours No results found for this basename: LIPASE, AMYLASE,  in the last 168 hours No results found for this basename: AMMONIA,  in the last 168 hours CBC:  Recent Labs Lab 12/15/12 0500 12/16/12 0615 12/17/12 0452 12/18/12 0600 12/19/12 0515  WBC 12.1* 11.8* 13.3* 13.1* 11.3*  HGB 11.6* 11.3* 11.6* 11.7* 11.0*  HCT 36.5* 35.8* 36.5* 36.1* 34.1*  MCV 85.7 85.6 85.9 84.9 85.0  PLT 241 250 243 252 248   Cardiac Enzymes: No results found for this basename: CKTOTAL, CKMB, CKMBINDEX, TROPONINI,  in the last 168 hours CBG:  Recent Labs Lab 12/13/12 2153 12/14/12 0601 12/14/12 1116 12/14/12 1641  GLUCAP 175* 111* 109* 99    Iron Studies: No results found for this basename: IRON, TIBC, TRANSFERRIN, FERRITIN,   in the last 72 hours Studies/Results: US Renal  12/17/2012  *RADIOLOGY REPORT*  Clinical Data: Acute renal insufficiency after cardiac catheterization.  RENAL/URINARY TRACT ULTRASOUND COMPLETE  Comparison:  10/22/2006  Findings:  Right Kidney:  15.2 cm.  Mild to moderate hydronephrosis.  Upper pole right renal cyst measures 8.4 cm and is enlarged since the prior exam.  There is an interpolar right renal 6.9 cm cyst.  This may have complexity in its inferior portion on image 19. Alternatively this apparent complexity could be artifactual. Normal renal cortical thickness for age.  Left Kidney:  12.6 cm.  Normal renal cortical thickness for age. Minimal pelvicaliectasis.  Favored to be a secondary to the extent of bladder distention.  Bladder:  Borderline bladder distention.  Bilateral ureteric jets identified.  IMPRESSION:  1.  Mild-moderate right and minimal left-sided hydronephrosis. Left-sided pelvicaliectasis is favored to be within normal variation, given the extent of borderline bladder distention. Right sided hydronephrosis is without identifiable cause.  Consider further characterization with CT. 2.  Right renal lesions.  Dominant upper pole lesion which is likely a cyst.  An interpolar lesion may have complexity in its inferior portion.  This also would be better evaluated with CT.  If the patient cannot receive CT contrast, unenhanced non emergent MRI  may also be informative.   Original Report Authenticated By: Jeronimo Greaves, M.D.    . amiodarone  200 mg Oral BID  . antiseptic oral rinse  15 mL Mouth Rinse BID  . atorvastatin  20 mg Oral q1800  . buPROPion  75 mg Oral BID WC  . ciprofloxacin  250 mg Oral BID  . clonazePAM  0.5 mg Oral Daily  . finasteride  5 mg Oral Daily  . metoprolol succinate  75 mg Oral BID  . sodium chloride  3 mL Intravenous Q12H  . Warfarin - Pharmacist Dosing Inpatient   Does not apply q1800    BMET    Component Value Date/Time   NA 145 12/19/2012 0515   K 4.0  12/19/2012 0515   CL 109 12/19/2012 0515   CO2 27 12/19/2012 0515   GLUCOSE 99 12/19/2012 0515   BUN 40* 12/19/2012 0515   CREATININE 1.26 12/19/2012 0515   CREATININE 0.99 01/20/2011 1711   CALCIUM 8.9 12/19/2012 0515   GFRNONAA 50* 12/19/2012 0515   GFRAA 58* 12/19/2012 0515   CBC    Component Value Date/Time   WBC 11.3* 12/19/2012 0515   RBC 4.01* 12/19/2012 0515   HGB 11.0* 12/19/2012 0515   HCT 34.1* 12/19/2012 0515   PLT 248 12/19/2012 0515   MCV 85.0 12/19/2012 0515   MCH 27.4 12/19/2012 0515   MCHC 32.3 12/19/2012 0515   RDW 16.2* 12/19/2012 0515   LYMPHSABS 1.0 12/06/2012 1710   MONOABS 1.2* 12/06/2012 1710   EOSABS 0.1 12/06/2012 1710   BASOSABS 0.0 12/06/2012 1710     Assessment/Plan:  1. AKI- related to multiple renal insults including ischemic ATN/IV diuresis/relative hypotension/IV contrast with concomitant ACE Inhibition/further complicated by bladder outlet obstruction and hydro.  1. Marked improvement following Foley cath placement and drainage of bladder.  2. Scr back at baseline.  3. Agree with holding ACE and diuretics.  2. BOO- s/p foley. F/u with Urology as an outpt and keep foley in place for now 3. Hypernatremia- increase free water intake and follow. Hold diuretics as pt is volume depleted 4. A Fib with RVR- rate controlled 5. CHF- diuresed ~6kg since admission. Will follow off of diuretics 6. ACDz- stable 7. CAD- nonobstructing. Plan per cardiology 8. HTN- stable 9. Anxiety- per primary service.  10. Dispo- chronically ill and frail WM. Unclear if he will be strong enough to return home to care for his wife. Recommend PT/OT eval.  Nothing further to add.  Will sign off.  Pt does not require f/u with Nephrology as he is at his baseline and is a poor dialysis candidate.  F/u per his PCP. 11.   Venesha Petraitis A

## 2012-12-19 NOTE — Progress Notes (Signed)
I have reviewed and agree with this note.  Ignacia Palma, OTR/L [phone] 12/19/2012

## 2012-12-20 ENCOUNTER — Encounter (HOSPITAL_COMMUNITY): Payer: Self-pay | Admitting: Physician Assistant

## 2012-12-20 ENCOUNTER — Telehealth: Payer: Self-pay | Admitting: Cardiology

## 2012-12-20 LAB — CBC
Platelets: 242 10*3/uL (ref 150–400)
RBC: 4.02 MIL/uL — ABNORMAL LOW (ref 4.22–5.81)
WBC: 9.9 10*3/uL (ref 4.0–10.5)

## 2012-12-20 LAB — BASIC METABOLIC PANEL
CO2: 26 mEq/L (ref 19–32)
Chloride: 106 mEq/L (ref 96–112)
Potassium: 4.2 mEq/L (ref 3.5–5.1)
Sodium: 140 mEq/L (ref 135–145)

## 2012-12-20 LAB — PROTIME-INR
INR: 2.56 — ABNORMAL HIGH (ref 0.00–1.49)
Prothrombin Time: 26.3 seconds — ABNORMAL HIGH (ref 11.6–15.2)

## 2012-12-20 MED ORDER — METOPROLOL SUCCINATE ER 50 MG PO TB24: 50.0000 mg | ORAL_TABLET | Freq: Every day | ORAL | Status: DC

## 2012-12-20 MED ORDER — FINASTERIDE 5 MG PO TABS: 5.0000 mg | ORAL_TABLET | Freq: Every day | ORAL | Status: AC

## 2012-12-20 MED ORDER — METOPROLOL SUCCINATE ER 50 MG PO TB24
50.0000 mg | ORAL_TABLET | Freq: Two times a day (BID) | ORAL | Status: DC
Start: 2012-12-20 — End: 2012-12-20
  Administered 2012-12-20: 50 mg via ORAL
  Filled 2012-12-20 (×3): qty 1

## 2012-12-20 MED ORDER — CIPROFLOXACIN HCL 250 MG PO TABS: 250.0000 mg | ORAL_TABLET | Freq: Two times a day (BID) | ORAL | Status: DC

## 2012-12-20 MED ORDER — WARFARIN SODIUM 5 MG PO TABS: ORAL_TABLET | ORAL | Status: DC

## 2012-12-20 MED ORDER — AMIODARONE HCL 200 MG PO TABS: 200.0000 mg | ORAL_TABLET | Freq: Every day | ORAL | Status: DC

## 2012-12-20 MED ORDER — WARFARIN SODIUM 2.5 MG PO TABS
2.5000 mg | ORAL_TABLET | Freq: Once | ORAL | Status: DC
  Filled 2012-12-20: qty 1

## 2012-12-20 MED ORDER — FUROSEMIDE 20 MG PO TABS: 20.0000 mg | ORAL_TABLET | Freq: Every day | ORAL | Status: DC

## 2012-12-20 MED ORDER — ACETAMINOPHEN ER 650 MG PO TBCR: 650.0000 mg | EXTENDED_RELEASE_TABLET | Freq: Four times a day (QID) | ORAL | Status: DC | PRN

## 2012-12-20 MED ORDER — TAMSULOSIN HCL 0.4 MG PO CAPS: 0.4000 mg | ORAL_CAPSULE | Freq: Every day | ORAL | Status: DC

## 2012-12-20 MED ORDER — ATORVASTATIN CALCIUM 20 MG PO TABS: 20.0000 mg | ORAL_TABLET | Freq: Every day | ORAL | Status: DC

## 2012-12-20 NOTE — Plan of Care (Signed)
Problem: Discharge Progression Outcomes Goal: Discharge plan in place and appropriate Outcome: Completed/Met Date Met:  12/20/12 Pt being dc to [facility], report called, pt stable, family at bedside, pt leaving via ambulance, pt stable Goal: Pain controlled with appropriate interventions Outcome: Completed/Met Date Met:  12/20/12 Pt has not had any c/o pain Goal: Tolerating diet Outcome: Completed/Met Date Met:  12/20/12 Pt tolerating diet, no c/o n/v

## 2012-12-20 NOTE — Progress Notes (Addendum)
Patient: [name] / Admit Date: 12/06/2012 / Date of Encounter: 12/20/2012, 8:31 AM   Subjective  Feeling better. No CP or SOB. Has insomnia but slept pretty well last night. Hasn't had a recent BM.   Objective   Telemetry: afib rates 80s- 90s Physical Exam: Filed Vitals:   12/20/12 0751  BP: 114/66  Pulse: 88  Temp: 97.6  Resp: 18   General: Elderly WM in no acute distress. Head: Normocephalic, atraumatic, sclera non-icteric, no xanthomas, nares are without discharge. Neck: Negative for carotid bruits. JVD not elevated. Lungs: Clear bilaterally to auscultation without wheezes, rales, or rhonchi. Breathing is unlabored. Heart: Irregular rhythm with controlled rate, S1 S2, soft apical murmur, no rubs or gallops.  Abdomen: Soft, non-tender, non-distended with normoactive bowel sounds. No hepatomegaly. No rebound/guarding. No obvious abdominal masses. Msk:  Strength and tone appear normal for age. Extremities: No clubbing or cyanosis. No edema.  Venous hyperpigmentation changes. Distal pedal pulses are 2+ and equal bilaterally. Neuro: Alert and oriented X 3. Moves all extremities spontaneously. Slightly hard of hearing. Psych:  Responds to questions appropriately with a normal affect.    Intake/Output Summary (Last 24 hours) at 12/20/12 0831 Last data filed at 12/19/12 2140  Gross per 24 hour  Intake    483 ml  Output   1200 ml  Net   -717 ml    Inpatient Medications:  . amiodarone  200 mg Oral BID  . antiseptic oral rinse  15 mL Mouth Rinse BID  . atorvastatin  20 mg Oral q1800  . buPROPion  75 mg Oral BID WC  . ciprofloxacin  250 mg Oral BID  . clonazePAM  0.5 mg Oral Daily  . finasteride  5 mg Oral Daily  . metoprolol succinate  50 mg Oral BID  . sodium chloride  3 mL Intravenous Q12H  . tamsulosin  0.4 mg Oral Daily  . Warfarin - Pharmacist Dosing Inpatient   Does not apply q1800    Labs:  Recent Labs  12/19/12 0515 12/20/12 0550  NA 145 140  K 4.0 4.2    CL 109 106  CO2 27 26  GLUCOSE 99 89  BUN 40* 25*  CREATININE 1.26 1.00  CALCIUM 8.9 8.6   No results found for this basename: AST, ALT, ALKPHOS, BILITOT, PROT, ALBUMIN,  in the last 72 hours  Recent Labs  12/19/12 0515 12/20/12 0550  WBC 11.3* 9.9  HGB 11.0* 11.0*  HCT 34.1* 34.9*  MCV 85.0 86.8  PLT 248 242     Radiology/Studies:  US Renal  12/17/2012  *RADIOLOGY REPORT*  Clinical Data: Acute renal insufficiency after cardiac catheterization.  RENAL/URINARY TRACT ULTRASOUND COMPLETE  Comparison:  10/22/2006  Findings:  Right Kidney:  15.2 cm.  Mild to moderate hydronephrosis.  Upper pole right renal cyst measures 8.4 cm and is enlarged since the prior exam.  There is an interpolar right renal 6.9 cm cyst.  This may have complexity in its inferior portion on image 19. Alternatively this apparent complexity could be artifactual. Normal renal cortical thickness for age.  Left Kidney:  12.6 cm.  Normal renal cortical thickness for age. Minimal pelvicaliectasis.  Favored to be a secondary to the extent of bladder distention.  Bladder:  Borderline bladder distention.  Bilateral ureteric jets identified.  IMPRESSION:  1.  Mild-moderate right and minimal left-sided hydronephrosis. Left-sided pelvicaliectasis is favored to be within normal variation, given the extent of borderline bladder distention. Right sided hydronephrosis is without identifiable cause.  Consider further characterization with CT. 2.  Right renal lesions.  Dominant upper pole lesion which is likely a cyst.  An interpolar lesion may have complexity in its inferior portion.  This also would be better evaluated with CT.  If the patient cannot receive CT contrast, unenhanced non emergent MRI may also be informative.   Original Report Authenticated By: Jeronimo Greaves, M.D.    Dg Chest Portable 1 View  12/06/2012  *RADIOLOGY REPORT*  Clinical Data: Shortness of breath.  PORTABLE CHEST - 1 VIEW  Comparison: 01/20/2012.  Findings: The  heart is enlarged.  There is tortuosity and calcification of the thoracic aorta.  There is vascular congestion, asymmetric pulmonary edema and bilateral pleural effusions consistent with CHF.  IMPRESSION: Congestive heart failure.   Original Report Authenticated By: Rudie Meyer, M.D.      Assessment and Plan  1. Atrial fibrillation: HR 80s-90s. EF was 25-30% on echo ? tachycardia-mediated cardiomyopathy as LHC showed only moderate coronary disease. Unable to cardiovert due to possible LAA thrombus on TEE.  - Continue amiodarone and Toprol XL. F/u studies while on amio at discretion of primary cardiologist.  - He is now on coumadin rather than NOAC due to fluctuations in creatinine. INR therapeutic. Will ask pharmacy to recommend home dose of Coumadin. Need to watch INR closely given addition of Cipro. - After 1 month of therapeutic INR, would plan DCCV.  2. CHF: Acute systolic, EF 25-30%. Diffuse hypokinesis with some regionality in the basal inferior and inferoseptal walls. 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 LHC showed a long 60% proximal LAD stenosis but not felt to have caused his cardiomyopathy. does not seem significantly volume overloaded at this point. Weight down significantly. - Continue Toprol XL  - No ACEI with AKI, can consider as OP 3. CAD: Moderate CAD with 60% long proximal LAD stenosis. He is on statin and anticoagulated.  4. AKI: Probably post-renal with obstruction likely at prostate level. Urology placed foley with good UOP and significantly improved Cr. Continue to monitor. On finasteride per urology, will need foley until he follows up as outpatient with urology. Not felt to be a candidate for further w/u of renal cysts. 5. UTI: Start Cipro. Given decreased GFR, will use 250 q12 x 2 wks for complicated UTI (has foley). (day 2/14) 6. Normocytic anemia: will need to follow as outpatient periodically given that he is on Coumadin. This has remained completely stable this  admission. 7. Constipation: he received a dose of bisacodyl this AM. Can try senna at nursing home if needed. 8. Disposition: Will need SNF placement => appreciate SW/CM assistance. He is encouraged to participate with PT. He is very weak.   MD - please sign FL2 if this hasn't been done already.  Signed, Ronie Spies PA-C  Patient seen with PA, agree with the above note.  Will not titrate cardiac meds any further given soft BP.  Can decrease amiodarone to 200 mg daily.  Creatinine back to baseline.  Will send him out on a low dose of Lasix, 40 mg daily.  Followup with me or PA in 7-10 days.  Needs coumadin clinic followup.  After 4 wks therapeutic anticoagulation, will need DCCV attempt.  Foley will stay in, will need followup with urology to determine when to remove.   Meds: Cipro 250 mg bid x total 14 days, Lasix 20 mg daily, Toprol XL 50 mg daily, amiodarone 200 mg daily, Flomax and finasteride per urology, atorvastatin 20 mg daily.   [name] 12/20/2012 12:41  PM

## 2012-12-20 NOTE — Progress Notes (Signed)
ANTICOAGULATION CONSULT NOTE - Follow Up Consult  Pharmacy Consult for Coumadin Indication: atrial fibrillation and possilbe LAA thrombus  Allergies  Allergen Reactions  . Darifenacin Hydrobromide     REACTION: constipation  . Fluoxetine Hcl     REACTION: Very Depressed while taking    Patient Measurements: Height: 6' (182.9 cm) Weight: 181 lb 7 oz (82.3 kg) (bedscale) IBW/kg (Calculated) : 77.6  Vital Signs: Temp: 97.6 F (36.4 C) (05/02 0430) Temp src: Oral (05/02 0430) BP: 114/66 mmHg (05/02 0751) Pulse Rate: 88 (05/02 0751)  Labs:  Recent Labs  12/18/12 0600 12/19/12 0515 12/20/12 0550  HGB 11.7* 11.0* 11.0*  HCT 36.1* 34.1* 34.9*  PLT 252 248 242  LABPROT 23.0* 23.9* 26.3*  INR 2.14* 2.25* 2.56*  CREATININE 2.49* 1.26 1.00    Estimated Creatinine Clearance: 58.2 ml/min (by C-G formula based on Cr of 1).  Assessment:   INR remains therapeutic after Coumadin 5 mg x 3 doses, but trending up. Changed back to Coumadin from Eliquis on 4/29 due to worsening renal function. Foley placed 4/29, 2L out, Proscar added.  Scr now down.   Cipro 250 mg PO BID added today and planned x 2 weeks. Sometimes effects Coumadin dosing. Foley to stay in place for at least a few weeks, per Urology. Urine culture pending, but >100K/ml GNR growing.  Goal of Therapy:  INR 2-3 Monitor platelets by anticoagulation protocol: Yes   Plan:   Coumadin 2.5 mg today. May need 2.5 mg alternating with 5 mg for now.  Daily PT/INR while in the hospital.  If discharged to SNF today, would recheck PT/INR on Monday.   Will need to watch for possible Cipro effect on PT/INR.    Follow up urine culture, to be sure organism is covered by Cipro.  Dennie Fetters, RPh Pager: [phone]. Phone: [phone] 12/20/2012,12:30 PM

## 2012-12-20 NOTE — Telephone Encounter (Signed)
New Problem:    Patient has been shceudled to have a 7 day TCM OV with Dr. Patty Sermons on 12/27/12 at 9:30a.m.

## 2012-12-20 NOTE — Discharge Summary (Signed)
Discharge Summary   Patient ID: [name] MRN: [id number], DOB/AGE: 1924/11/28 79 y.o. Admit date: 12/06/2012 D/C date:     12/20/2012  Primary Cardiologist: Shirlee Latch (will have to be put on [facility]'s schedule in followup due to scheduling issues and lack of available [facility] appointments in transition-of-care-followup timeframe)  Primary Discharge Diagnoses:  1. Atrial fibrillation with RVR, newly diagnosed - unable to cardiovert due to possible LAA thrombus, consider arranging as outpatient - Coumadin/amiodarone started 2. 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 Moderate CAD by cath - 60% long proximal LAD stenosis, 60% prox PDA by cath 12/10/12 5. Acute kidney injury - suspected post-renal with obstruction (hydronephrosis on renal US) - urology placed foley with good UOP and significantly improved Cr - plan discharge with foley & OP uro followup  - peak Cr 4.17, dc Cr 1.00 6. Right renal cysts by [place] this admission - not a candidate for further evaluation 7. Urinary tract infection 8. Normocytic anemia 9. Constipation  Secondary Discharge Diagnoses:  1. History of colonic polyps 2. HLD 3. HTN 4. Hx of basal cell cancer 5. BPH 6. Anxiety 7. Hx of presyncope - eval at that time with echo 03/2010,with EF 60%,mild RV dilation,no significant abnormalities...event monitor for 3wks/episodes os sinus bradycardia with heart rate to low 40's occasionally at night(suspect while sleeping)  Hospital Course: [name] is an 79 y/o M with history of HL, anemia, HTN, presyncope evaluated in 2011 who presented to [facility] 12/06/12 with complaints of SOB and malaise. He has been feeling poorly for several weeks. His wife noted significant worsening of SOB on day of admission so EMS  was called. He was found to be in atrial fibrillation with RVR and also clinical signs of heart failure. He was placed o IV diltiazem, heparin gtt, and IV Lasix. Digoxin was transiently added. 2D echo showed EF 25-30% with diffuse HK and akinesis of the basal inferior and inferoseptal myocardium, MR, see below for full report. Diltiazem was changed to BB. Amiodarone was added. His SOB improved with diuresis but given his low EF it was felt he would require cardiac cath to clarify coronary anatomy. On 12/10/12 this demonstrated long 60% stenosis in the prox LAD, 60% prox PDA stenosis. Given the length of the LAD stenosis this could certainly lead to ischemia but Dr. Shirlee Latch did not think it was the cause of his cardiomyopathy. He thought the cardiomyopathy was most likely to be tachycardia-mediated related to afib. He was transitioned to PO Lasix. He underwent TEE in prep for possible DCCV on 12/11/12 but could not be cardioverted due to question of LAA thrombus. This confirmed EF 25-30% and mild-mod MR. Coumadin was started in overlap with heparin. He was not placed on ASA due to concomitant Coumadin use. Cardioversion will need to be considered as outpatient after 4 weeks of therapeutic INR. During the latter half of his hospitalization, his creatinine was noted to be rising significantly. Diuretic, ACEI, and potassium were held. Renal was consulted as Cr had risen to 4.17. Renal US showed mild-moderate right and minimal left-sided hydronephrosis. His abdomen was tender thus foley was placed with significant urine output. His renal failure was felt to be post-obstructive. Cr returned to normal after foley placement. Urology evaluated the patient and recommended finasteride and for  the foley to remain in place until re-evaluated in the office to allow the bladder to rest given significant distention (>2L). With regard to renal cyst seen on renal US, urology felt he was not a candidate for any definitive therapy and  would NOT characterize further with additional imaging. Dr. Berneice Heinrich stated in his note that he would arrange outpatient followup. Cr today is 1.0. He remains off ACEI but this might able to be reconsidered as outpatient. He was noted to have a UTI and Dr. Shirlee Latch recommended Cipro 250mg  BID x 14 days. His INR will have to be watched closely while on Cipro. Weight is down to 181 today, which is 21lbs less than admission. He is net -7.6L. His SOB has improved. He was evaluated by PT and found to be significantly debilitated. SNF was recommended and the patient was in agreement with this. Dr. Shirlee Latch has seen and examined the patient today and feels he is stable for discharge. He will have his INR checked on Monday through the nursing home. Monitoring of additional organ systems while on amiodarone will be at discretion of primary cardiologist. The patient needs a follow-up appointment within 7-10 days - due to scheduling availability he will see Dr. Patty Sermons for his post-hospital visit then will need to be referred back to Dr. Shirlee Latch for consideration for DCCV.  Discharge Vitals: Blood pressure 114/66, pulse 88, temperature 97.6 F (36.4 C), temperature source Oral, resp. rate 18, height 6' (1.829 m), weight 181 lb 7 oz (82.3 kg), SpO2 94.00%.  Labs: Lab Results  Component Value Date   WBC 9.9 12/20/2012   HGB 11.0* 12/20/2012   HCT 34.9* 12/20/2012   MCV 86.8 12/20/2012   PLT 242 12/20/2012     Recent Labs Lab 12/20/12 0550  NA 140  K 4.2  CL 106  CO2 26  BUN 25*  CREATININE 1.00  CALCIUM 8.6  GLUCOSE 89   Lab Results  Component Value Date   CHOL 144 01/20/2011   HDL 36* 01/20/2011   LDLCALC 83 01/20/2011   TRIG 125 01/20/2011    Diagnostic Studies/Procedures   Cardiac Cath 12/10/12 Indication: New-onset left heart failure.  Procedural Details: Positive Allen's test. The right wrist was prepped, draped, and anesthetized with 1% lidocaine. Using the modified Seldinger technique, a 5 French sheath was  introduced into the right radial artery. 3 mg of verapamil was administered through the sheath, weight-based unfractionated heparin was administered intravenously. 3DRC was used to engage the RCA, JL3.5 to engage the LCA. Catheter exchanges were performed over an exchange length guidewire. There were no immediate procedural complications. A TR band was used for radial hemostasis at the completion of the procedure. The patient was transferred to the post catheterization recovery area for further monitoring.  Procedural Findings:  Hemodynamics:  AO 94/55  LV 99/14  Coronary angiography:  Coronary dominance: right  Left mainstem: Short, no significant disease.  Left anterior descending (LAD): Long approximately 60% stenosis in the proximal LAD.  Left circumflex (LCx): Large, dominant vessel. Provides left PDA. 60% proximal PDA stenosis.  Right coronary artery (RCA): Small, nondominant RCA with no significant disease.  Left ventriculography: Not done (echo yesterday).  Final Conclusions: Moderate, long, around 60% stenosis in the proximal LAD. Given length this could certainly lead to ischemia but I do not think that it is the cause of his cardiomyopathy. I think that the cardiomyopathy is most likely to be tachycardia-mediated based on today's findings. Will plan TEE-guided DCCV tomorrow as her remains  in atrial fibrillation. Can transition to po Lasix.   2D Echo 12/07/12 - Left ventricle: The cavity size was normal. Wall thickness was normal. Systolic function was severely reduced. The estimated ejection fraction was in the range of 25% to 30% in the setting of atrial fibrillation. Diffuse hypokinesis. There is akinesis of the basalinferior and inferoseptal myocardium. The study is not technically sufficient to allow evaluation of LV diastolic function. - Aortic valve: Trileaflet; mildly calcified leaflets. Left coronary cusp mobility was restricted. There was no stenosis. Trivial  regurgitation. - Mitral valve: Mildly thickened leaflets . Moderate to severe regurgitation. Unable to calculate regurgitant volume based on information provided. Consider TEE or additional images with PISA. - Left atrium: The atrium was moderately dilated. - Right ventricle: The cavity size was moderately dilated. The moderator band was prominent. Systolic function was moderately reduced. - Right atrium: The atrium was moderately dilated. Central venous pressure: 15mm Hg (est). - Tricuspid valve: Mild-moderate regurgitation. - Pulmonary arteries: PA peak pressure: 43mm Hg (S). - Pericardium, extracardiac: There was no pericardial effusion. There was a left pleural effusion. Impressions: - Since prior study 2011, there has been significant reduction in LV and RV systolic function as well as progression in degree of mitral regurgitation.  TEE 12/11/12 Study Conclusions - Left ventricle: Systolic function was severely reduced. The estimated ejection fraction was in the range of 25% to 30%. - Aortic valve: No evidence of vegetation. - Mitral valve: Mild to moderate regurgitation. - Atrial septum: No defect or patent foramen ovale was identified.  US Renal 12/17/2012  *RADIOLOGY REPORT*  Clinical Data: Acute renal insufficiency after cardiac catheterization.  RENAL/URINARY TRACT ULTRASOUND COMPLETE  Comparison:  10/22/2006  Findings:  Right Kidney:  15.2 cm.  Mild to moderate hydronephrosis.  Upper pole right renal cyst measures 8.4 cm and is enlarged since the prior exam.  There is an interpolar right renal 6.9 cm cyst.  This may have complexity in its inferior portion on image 19. Alternatively this apparent complexity could be artifactual. Normal renal cortical thickness for age.  Left Kidney:  12.6 cm.  Normal renal cortical thickness for age. Minimal pelvicaliectasis.  Favored to be a secondary to the extent of bladder distention.  Bladder:  Borderline bladder distention.  Bilateral  ureteric jets identified.  IMPRESSION:  1.  Mild-moderate right and minimal left-sided hydronephrosis. Left-sided pelvicaliectasis is favored to be within normal variation, given the extent of borderline bladder distention. Right sided hydronephrosis is without identifiable cause.  Consider further characterization with CT. 2.  Right renal lesions.  Dominant upper pole lesion which is likely a cyst.  An interpolar lesion may have complexity in its inferior portion.  This also would be better evaluated with CT.  If the patient cannot receive CT contrast, unenhanced non emergent MRI may also be informative.   Original Report Authenticated By: Jeronimo Greaves, M.D.    Dg Chest Portable 1 View 12/06/2012  *RADIOLOGY REPORT*  Clinical Data: Shortness of breath.  PORTABLE CHEST - 1 VIEW  Comparison: 01/20/2012.  Findings: The heart is enlarged.  There is tortuosity and calcification of the thoracic aorta.  There is vascular congestion, asymmetric pulmonary edema and bilateral pleural effusions consistent with CHF.  IMPRESSION: Congestive heart failure.   Original Report Authenticated By: Rudie Meyer, M.D.     Discharge Medications     Medication List    STOP taking these medications       aspirin 81 MG tablet     doxazosin 2 MG  tablet  Commonly known as:  CARDURA     Ginkgo Biloba 120 MG Caps      TAKE these medications       acetaminophen 650 MG CR tablet  Commonly known as:  RA ARTHRITIS PAIN RELIEF  Take 1 tablet (650 mg total) by mouth every 6 (six) hours as needed for pain.     amiodarone 200 MG tablet  Commonly known as:  PACERONE  Take 1 tablet (200 mg total) by mouth daily.     atorvastatin 20 MG tablet  Commonly known as:  LIPITOR  Take 1 tablet (20 mg total) by mouth daily.     buPROPion 75 MG tablet  Commonly known as:  WELLBUTRIN  TAKE 1 TABLET TWICE A DAY     ciprofloxacin 250 MG tablet  Commonly known as:  CIPRO  Take 1 tablet (250 mg total) by mouth 2 (two) times daily.  Take through 01/01/13 to complete 14-day course (started 12/19/12)     clonazePAM 0.5 MG tablet  Commonly known as:  KLONOPIN  TAKE 1 TAB IN THE MORNING & 2 TABS AT BEDTIME (THIS MED IS "AS NEEDED" & NOT TAKEN ON REGULAR BASIS)     finasteride 5 MG tablet  Commonly known as:  PROSCAR  Take 1 tablet (5 mg total) by mouth daily.     furosemide 20 MG tablet  Commonly known as:  LASIX  Take 1 tablet (20 mg total) by mouth daily.     hydrocortisone 25 MG suppository  Commonly known as:  ANUSOL-HC  Place 1 suppository (25 mg total) rectally 2 (two) times daily.     metoprolol succinate 50 MG 24 hr tablet  Commonly known as:  TOPROL-XL  Take 1 tablet (50 mg total) by mouth daily. Take with or immediately following a meal.     multivitamin 3-35-2 MG Tabs tablet  Take 1 tablet by mouth 2 (two) times daily.     tamsulosin 0.4 MG Caps  Commonly known as:  FLOMAX  Take 1 capsule (0.4 mg total) by mouth daily.     warfarin 5 MG tablet  Commonly known as:  COUMADIN  By mouth as directed: Take 1/2 tablet (2.5mg ) tonight 12/20/12, 1 tablet (5mg ) tomorrow 12/21/12, 1/2 (2.5mg ) tablet on 12/22/12, then have INR checked on 12/23/12 to determine if dose needs adjusting.        Disposition   The patient will be discharged in stable condition to SNF. Discharge Orders   Future Appointments Provider Department Dept Phone   12/27/2012 9:30 AM Cassell Clement, MD [facility] Main Office [place]) [phone]   Future Orders Complete By Expires     Diet - low sodium heart healthy  As directed     Discharge instructions  As directed     Comments:      Keep foley in place for now until you are evaluated by urology.  Have INR checked on 12/23/12, call results to [facility].  One of your heart tests showed weakness of the heart muscle this admission. This may make you more susceptible to weight gain from fluid retention, which can lead to symptoms that we call heart failure.  For  patients with congestive heart failure, we give them these special instructions:  1. Follow a low-salt diet and watch your fluid intake. In general, you should not be taking in more than 2 liters of fluid per day (no more than 8 glasses per day). Some patients are restricted to less  than 1.5 liters of fluid per day (no more than 6 glasses per day). This includes sources of water in foods like soup, coffee, tea, milk, etc. 2. Weigh yourself on the same scale at same time of day and keep a log. 3. Call your doctor: (Anytime you feel any of the following symptoms)  - 3-4 pound weight gain in 1-2 days or 2 pounds overnight  - Shortness of breath, with or without a dry hacking cough  - Swelling in the hands, feet or stomach  - If you have to sleep on extra pillows at night in order to breathe   IT IS IMPORTANT TO LET YOUR DOCTOR KNOW EARLY ON IF YOU ARE HAVING SYMPTOMS SO WE CAN HELP YOU!    Increase activity slowly  As directed       Follow-up Information   Follow up with Sebastian Ache, MD. (Dr. Emmaline Life office will arrange a followup appointment for 2-3 weeks. Your foley catheter is to remain in place until that appointment. Please call office if you have not heard from them within 3 days.)    Contact information:   [address] [place] [address] [phone]       Follow up with Cassell Clement, MD. (12/27/12 at 9:30am)    Contact information:   [facility] [address], [address] [place] [address] [phone]       Follow up with [facility]. (Please have nursing home draw INR on Monday 12/23/12, results to [facility])    Contact information:   [facility] [address] [place] [address] [phone]        Duration of Discharge Encounter: Greater than 30 minutes including physician and PA time.  Signed, Kriste Basque [place] PA-C 12/20/2012, 2:07 PM

## 2012-12-21 LAB — URINE CULTURE: Colony Count: >=100000

## 2012-12-23 NOTE — Telephone Encounter (Signed)
Patient is at [facility].  I spoke with Barbara Cower, her caretaker who states that patient is doing well, taking all medications.  He verified that she has an INR ordered for today and is aware of appointment on 5/9.

## 2012-12-24 ENCOUNTER — Telehealth: Payer: Self-pay | Admitting: Pharmacist

## 2012-12-24 NOTE — Telephone Encounter (Signed)
Lurena Joiner with [facility] called to report pt's INR.  They have been managing it for pt since he has been there.  Will allow [facility] to continue to manage until pt is discharged home.  At that time, we will be able to take over care of INR.

## 2012-12-25 ENCOUNTER — Encounter: Payer: Self-pay | Admitting: Cardiology

## 2012-12-25 LAB — PROTIME-INR: INR: 2 — AB (ref 0.9–1.1)

## 2012-12-26 NOTE — Telephone Encounter (Signed)
Follow up    Pt daughter wanted to know if rehab facility faxed over INR. Please call pt's daughter to let her know if it was received.

## 2012-12-26 NOTE — Telephone Encounter (Signed)
Patient's daughter, Lynden Ang called back regarding patient's INR.  She explained that she was concerned that tomorrow would be too late to check on finding this results because they have an appointment at 9:30. I called [facility] and spoke with Harrell Gave, patient's caretaker who is faxing me another copy now.  I informed Lynden Ang who expressed gratitude and states she will meet her father here at his appointment tomorrow morning.  [name] lives in [place].

## 2012-12-26 NOTE — Telephone Encounter (Signed)
Follow up   Pt's daughter want someone to call her today to let her know if the INR was faxed. Please call pt

## 2012-12-26 NOTE — Telephone Encounter (Signed)
Left message for patient's daughter letting her know that Juliette Alcide and Dr. Patty Sermons are out of the office today and will return tomorrow.  I informed her that I looked in the obvious places for the fax and did not find the INR in question.  I am routing to [facility] so that she will be aware and can look tomorrow.

## 2012-12-27 ENCOUNTER — Ambulatory Visit (INDEPENDENT_AMBULATORY_CARE_PROVIDER_SITE_OTHER): Payer: Medicare Other | Admitting: Cardiology

## 2012-12-27 ENCOUNTER — Encounter: Payer: Self-pay | Admitting: Cardiology

## 2012-12-27 VITALS — BP 100/70 | HR 132 | Ht 71.75 in | Wt 181.8 lb

## 2012-12-27 DIAGNOSIS — N179 Acute kidney failure, unspecified: Secondary | ICD-10-CM

## 2012-12-27 DIAGNOSIS — I4891 Unspecified atrial fibrillation: Secondary | ICD-10-CM

## 2012-12-27 DIAGNOSIS — I5023 Acute on chronic systolic (congestive) heart failure: Secondary | ICD-10-CM | POA: Insufficient documentation

## 2012-12-27 NOTE — Assessment & Plan Note (Signed)
His acute kidney injury appears to be secondary to lower urinary tract obstruction which has now been relieved with Foley catheter.  We will hold off on adding any ACE inhibitor to his regimen at this point until we are sure that his kidney situation is stable.  Also his blood pressure today is low normal already.

## 2012-12-27 NOTE — Patient Instructions (Signed)
PER [facility] REGULATES PT/INR WHILE PATIENTS ARE THERE  INCREASE YOUR TOPROL TO 75 MG DAILY (1 AND 1/2 TABLETS DAILY)  Your physician recommends that you schedule a follow-up appointment in: 2 WEEKS WITH [facility]

## 2012-12-27 NOTE — Progress Notes (Signed)
Alan Mulder Date of Birth:  Mar 10, 1925 [facility] [address] [place], [place]  [address] [phone]         Fax   [phone]  History of Present Illness: This pleasant 79 year old gentleman is seen for transitional care followup following recent discharge from the hospital.  He is 79 years old.  He was hospitalized on 12/06/12 and discharged on 12/20/12.  He was admitted with atrial fibrillation with rapid ventricular response, newly diagnosed and was unable to be cardioverted to to a possible left atrial appendage thrombus which was found by TEE that admission.  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  He has a history of renal insufficiency aggravated by post renal obstruction and improved after placing a Foley catheter.  He still has his Foley catheter.  His peak creatinine was 4.17 and his discharge creatinine was 1.00 The patient now resides at [facility].  He has been there for one week.  He still has his Foley catheter in place.  His post hospital urology visit is pending.  Current Outpatient Prescriptions  Medication Sig Dispense Refill  . acetaminophen (RA ARTHRITIS PAIN RELIEF) 650 MG CR tablet Take 1 tablet (650 mg total) by mouth every 6 (six) hours as needed for pain.      [name] amiodarone (PACERONE) 200 MG tablet Take 1 tablet (200 mg total) by mouth daily.  30 tablet  2  . atorvastatin (LIPITOR) 20 MG tablet Take 1 tablet (20 mg total) by mouth daily.  30 tablet  2  . buPROPion (WELLBUTRIN) 75 MG tablet TAKE 1 TABLET TWICE A DAY  60 tablet  4  . ciprofloxacin (CIPRO) 250 MG tablet Take 1 tablet (250 mg total) by mouth 2 (two) times daily. Take through 01/01/13 to complete 14-day course (started  12/19/12)  25 tablet  0  . clonazePAM (KLONOPIN) 0.5 MG tablet TAKE 1 TAB IN THE MORNING & 2 TABS AT BEDTIME (THIS MED IS "AS NEEDED" & NOT TAKEN ON REGULAR BASIS)  90 tablet  0  . finasteride (PROSCAR) 5 MG tablet Take 1 tablet (5 mg total) by mouth daily.  30 tablet  0  . furosemide (LASIX) 20 MG tablet Take 1 tablet (20 mg total) by mouth daily.  30 tablet  2  . hydrocortisone (ANUSOL-HC) 25 MG suppository Place 1 suppository (25 mg total) rectally 2 (two) times daily.  14 suppository  0  . metoprolol succinate (TOPROL-XL) 50 MG 24 hr tablet Take 50 mg by mouth as directed. 1 AND 1/2 TABLETS DAILY      . multivitamin (METANX) 3-35-2 MG TABS tablet Take 1 tablet by mouth 2 (two) times daily.      . tamsulosin (FLOMAX) 0.4 MG CAPS Take 1 capsule (0.4 mg total) by mouth daily.  30 capsule  0  . warfarin (COUMADIN) 5 MG tablet By mouth as directed: Take 1/2 tablet (2.5mg ) tonight 12/20/12, 1 tablet (5mg ) tomorrow 12/21/12, 1/2 (2.5mg ) tablet on 12/22/12, then have INR checked on 12/23/12 to determine if dose needs adjusting.  30 tablet  2   No current facility-administered medications for this visit.    Allergies  Allergen Reactions  . Darifenacin Hydrobromide     REACTION: constipation  . Fluoxetine Hcl  REACTION: Very Depressed while taking    Patient Active Problem List   Diagnosis Date Noted  . AKI (acute kidney injury) 12/13/2012  . Sepsis 01/20/2012  . UTI (lower urinary tract infection) 01/20/2012  . CONSTIPATION 10/12/2010  . DEPRESSION, RECURRENT 07/04/2010  . INTENTION TREMOR 07/04/2010  . INSOMNIA 06/27/2010  . NOCTURIA 06/27/2010  . ABNORMAL ELECTROCARDIOGRAM 04/19/2010  . MENTAL CONFUSION 03/08/2010  . Dizziness and giddiness 03/08/2010  . LACK OF COORDINATION 03/08/2010  . FATIGUE 02/03/2009  . FASTING HYPERGLYCEMIA 02/03/2009  . SKIN CANCER, HX OF 02/03/2009  . HYPERLIPIDEMIA 10/19/2006  . ANEMIA-NOS 10/19/2006  . HYPERTENSION 10/19/2006  . COLONIC POLYPS, HX OF  10/19/2006    History  Smoking status  . Former Smoker  . Quit date: 08/22/1983  Smokeless tobacco  . Not on file    History  Alcohol Use No    Family History  Problem Relation Age of Onset  . Heart attack Father   . Heart failure Father 58    S/P Turp  . Heart disease Father 56    MI.Marland KitchenMarland KitchenCHF S/P TURP @ 70  . Stroke Brother 64  . Coronary artery disease Brother   . Lung cancer Brother     Review of Systems: Constitutional: no fever chills diaphoresis or fatigue or change in weight.  Head and neck: no hearing loss, no epistaxis, no photophobia or visual disturbance. Respiratory: No cough, shortness of breath or wheezing. Cardiovascular: No chest pain peripheral edema, palpitations. Gastrointestinal: No abdominal distention, no abdominal pain, no change in bowel habits hematochezia or melena. Genitourinary: No dysuria, no frequency, no urgency, no nocturia. Musculoskeletal:No arthralgias, no back pain, no gait disturbance or myalgias. Neurological: No dizziness, no headaches, no numbness, no seizures, no syncope, no weakness, no tremors. Hematologic: No lymphadenopathy, no easy bruising. Psychiatric: No confusion, no hallucinations, no sleep disturbance.    Physical Exam: Filed Vitals:   12/27/12 0936  BP: 100/70  Pulse: 132   the general appearance reveals a pleasant elderly gentleman sitting in a wheelchair.  Foley catheter is draining clear urine.The head and neck exam reveals pupils equal and reactive.  Extraocular movements are full.  There is no scleral icterus.  The mouth and pharynx are normal.  The neck is supple.  The carotids reveal no bruits.  The jugular venous pressure is normal.  The  thyroid is not enlarged.  There is no lymphadenopathy.  The chest is clear to percussion and auscultation.  There are no rales or rhonchi.  Expansion of the chest is symmetrical.  The pulse is rapid and irregular. The precordium is quiet.  The first heart sound is normal.  The  second heart sound is physiologically split.  There is no murmur gallop rub or click.  There is no abnormal lift or heave.  The abdomen is soft and nontender.  The bowel sounds are normal.  The liver and spleen are not enlarged.  There are no abdominal masses.  There are no abdominal bruits.  Extremities reveal good pedal pulses.  There is no phlebitis or edema.  There is no cyanosis or clubbing.  Strength is normal and symmetrical in all extremities.  There is no lateralizing weakness.  There are no sensory deficits.  The skin is warm and dry.  There is no rash.   EKG shows atrial fibrillation with rapid ventricular response and nonspecific ST-T wave changes  Assessment / Plan: Continue same medication.  He will continue to have his INRs drawn at the skilled nursing  facility with results sent to our Coumadin clinic for instruction on subsequent dosing. We will plan to have him return to see Dr. Shirlee Latch in about 2 more weeks for followup office visit and further discussion regarding direct-current cardioversion.

## 2012-12-27 NOTE — Assessment & Plan Note (Signed)
He is felt to have a tachycardia mediated cardiomyopathy.  We will attempt to slow his pulse with additional beta blocker.  Eventual plan will be elective outpatient cardioversion after he has been adequately anticoagulated for 4 weeks.  His INR one week ago was therapeutic and 2 days ago his INR drawn at the nursing home was therapeutic at 2.0.

## 2012-12-27 NOTE — Assessment & Plan Note (Signed)
The patient is on amiodarone 200 mg daily and Toprol 50 mg daily.  EKG today shows that he is still having rapid ventricular response to his atrial fibrillation with heart rate of 122.  His blood pressure is low normal and so we cannot push his beta blocker too high but we will increase him from 50 mg to 75 mg Toprol-XL to gain additional rate control.  The patient is tolerating his atrial fibrillation well without side effects of chest pain or shortness of breath.  His activity is very limited.  He is in a wheelchair today.

## 2012-12-31 ENCOUNTER — Telehealth: Payer: Self-pay | Admitting: Cardiology

## 2012-12-31 NOTE — Telephone Encounter (Signed)
Left a message to call back.

## 2012-12-31 NOTE — Telephone Encounter (Signed)
Follow Up   Pt calling in returning a phone call she received earler today. Please call.

## 2012-12-31 NOTE — Telephone Encounter (Signed)
New problem    Per pts daughter was told by dr Patty Sermons to see dr Shirlee Latch within 2 wks-no availability with [facility] or [facility] for that time period-please call pts daughter about appt info-offered 1st available which was august but she said pt was to be seen within 2 wks

## 2012-12-31 NOTE — Telephone Encounter (Signed)
Left cathy pt's family member  a message to call back.

## 2012-12-31 NOTE — Telephone Encounter (Signed)
Pt's daughter called; pt was seen post hospital by Dr. Patty Sermons and  recommended for pt to be see in two weeks but Dr. Shirlee Latch. Daughter is aware that Dr. Shirlee Latch does not have any openings nor have the  PN/ NP to cover at this time. Pt's daughter is aware that I will send this message to Dr. Alford Highland nurse to see what she can do. Daughter is aware that it will be fine if pt can't be seen in two weeks, because Pt had a post-hospital visit  With Dr. Patty Sermons. Pt's daughter verbalized understanding. She said lives in [place] and will be in town with pt through  01/13/13 and needs to bring pt for the OV.

## 2013-01-06 NOTE — Telephone Encounter (Signed)
New Prob      Would like to speak to nurse regarding an appt with Dr/ Shirlee Latch. Please call.

## 2013-01-06 NOTE — Telephone Encounter (Signed)
I will forward to scheduler to schedule appt with Sunday Spillers for 01/07/13.

## 2013-01-06 NOTE — Telephone Encounter (Signed)
Spoke with daughter. Appt made with Dr Shirlee Latch 01/16/13. Pt could not come this week.

## 2013-01-16 ENCOUNTER — Ambulatory Visit (INDEPENDENT_AMBULATORY_CARE_PROVIDER_SITE_OTHER): Payer: Medicare Other | Admitting: Cardiology

## 2013-01-16 ENCOUNTER — Encounter: Payer: Self-pay | Admitting: *Deleted

## 2013-01-16 ENCOUNTER — Encounter: Payer: Self-pay | Admitting: Cardiology

## 2013-01-16 VITALS — BP 118/60 | HR 110 | Ht 71.75 in | Wt 171.0 lb

## 2013-01-16 DIAGNOSIS — I509 Heart failure, unspecified: Secondary | ICD-10-CM

## 2013-01-16 DIAGNOSIS — I5022 Chronic systolic (congestive) heart failure: Secondary | ICD-10-CM

## 2013-01-16 DIAGNOSIS — I4891 Unspecified atrial fibrillation: Secondary | ICD-10-CM

## 2013-01-16 DIAGNOSIS — I2510 Atherosclerotic heart disease of native coronary artery without angina pectoris: Secondary | ICD-10-CM

## 2013-01-16 LAB — HEPATIC FUNCTION PANEL
Alkaline Phosphatase: 68 U/L (ref 39–117)
Bilirubin, Direct: 0.1 mg/dL (ref 0.0–0.3)

## 2013-01-16 LAB — POCT INR: INR: 2.1

## 2013-01-16 LAB — T3, FREE: T3, Free: 2.3 pg/mL (ref 2.3–4.2)

## 2013-01-16 MED ORDER — AMIODARONE HCL 200 MG PO TABS: ORAL_TABLET | ORAL | Status: DC

## 2013-01-16 MED ORDER — METOPROLOL SUCCINATE ER 50 MG PO TB24: 50.0000 mg | ORAL_TABLET | Freq: Every day | ORAL | Status: DC

## 2013-01-16 MED ORDER — METOPROLOL SUCCINATE ER 50 MG PO TB24: 50.0000 mg | ORAL_TABLET | Freq: Two times a day (BID) | ORAL | Status: DC

## 2013-01-16 NOTE — Patient Instructions (Addendum)
Decrease amiodarone to 100mg  daily. This will be 1/2 of your 200mg  tablet daily.   Increase Toprol XL (metoprolol succinate) to 50mg  two times a day.  Your physician recommends that you have  lab work today--TSH/Free T4/Free T3 /Liver profile.  Your physician has recommended that you have a Cardioversion (DCCV). Electrical Cardioversion uses a jolt of electricity to your heart either through paddles or wired patches attached to your chest. This is a controlled, usually prescheduled, procedure. Defibrillation is done under light anesthesia in the hospital, and you usually go home the day of the procedure. This is done to get your heart back into a normal rhythm. You are not awake for the procedure. Please see the instruction sheet given to you today. Tuesday January 21, 2013.  Your physician has requested that you have an echocardiogram. Echocardiography is a painless test that uses sound waves to create images of your heart. It provides your doctor with information about the size and shape of your heart and how well your heart's chambers and valves are working. This procedure takes approximately one hour. There are no restrictions for this procedure.  IN 2 MONTHS  Your physician recommends that you schedule a follow-up appointment in: 2 weeks with Dr Shirlee Latch.

## 2013-01-16 NOTE — Progress Notes (Signed)
Patient ID: [name] Randolph, male   DOB: 03/28/1925, 79 y.o.   MRN: [id number] PCP: Dr. Husain (at [facility])  79 yo presents for cardiology followup after admission to [facility] with atrial fibrillation/RVR and acute systolic CHF.  He was admitted in 4/14 with atrial fibrillation/RVR and volume overloaded.  Echo showed EF 25-30%.  He had a LHC showed 60% pLAD stenosis that was thought to not be hemodynamically significant.  TEE showed possible LAA thrombus, so DCCV was not done.  He was started on amiodarone and Toprol XL for HR control with plan to anticoagulate for 4 weeks followed by DCCV.  While in the hospital, he had AKI due to postrenal obstruction (BPH) and now has an indwelling foley.  Urology if following.  Creatinine improved back to normal range.  Patient additionally has significant depression.  He was diuresed, rate controlled, anticoagulated, and discharged to [facility] nursing home.    [name] Randolph is symptomatically stable.  He still has the foley in.  HR is elevated today at rest.  He gets mildly winded walking with PT.  He is walking with a walker.  No chest pain.  No PND but does sleep with head inclined in hospital bed. INR has been therapeutic since 4/29.  Labs (4/14): AST 82, ALT 89 Labs (5/14): K 4.5, creatinine 1.2, HCT 38.4, AST 103, ALT 167, TSH 5.22 (elevated)  ECG: atrial fibrillation at rate 110 with LPFB.   Past Medical History:  1. Anemia 2. Colonic polyps  3. Hyperlipidemia  4. Hypertension  5. Skin cancer, hx of, Basal cell 2010, 1989  6. Right TKR  7. BPH  8. Panic attacks/generalized anxiety/depression 9. Presyncope in 2011: Echo (8/11) with EF 60%, normal RV size and systolic function, no significant valvular abnormalities. Carotid dopplers (8/11): minimal carotid stenosis. Event monitor (10/11) for 3 wks: episodes of sinus bradycardia with heart rate to low 40s occasionally at night (suspect while sleeping). No other significant events.  10. Atrial  fibrillation: First noted in 4/14.  Possible LAA thrombus on TEE in 4/14.  11. Systolic CHF: Nonischemic cardiomyopathy.  Echo (4/14) with EF 25-30%, diffuse hypokinesis with basal inferior and inferoseptal akinesis, moderate-severe William, moderately dilated RV with moderately decreased systolic function, PA systolic pressure 43 mmHg.  LHC (4/14) with 60% pLAD stenosis thought not to be hemodynamically significant.  Possible tachycardia-mediated cardiomyopathy.   12. CAD: LHC (4/14) with 60% pLAD stenosis.  13. Elevated LFTs 14. Mitral regurgitation: Moderate to severe on echo in 4/14 but followup TEE showed only mild-moderate William.  15. Post-renal AKI: Has indwelling foley now for BPH.   Family History:  Father: MI @ 60, CHF S/P TURP @ 70  Mother: d 78  Siblings: bro d 82 CVAs; bro d 70 CAD; bro d 52 lung CA   Social History:  Retired  Married  Former Smoker:quit 1985  Now living at [facility]  ROS: All systems reviewed and negative except as per HPI.   Current Outpatient Prescriptions  Medication Sig Dispense Refill  . acetaminophen (RA ARTHRITIS PAIN RELIEF) 650 MG CR tablet Take 1 tablet (650 mg total) by mouth every 6 (six) hours as needed for pain.      . atorvastatin (LIPITOR) 20 MG tablet Take 1 tablet (20 mg total) by mouth daily.  30 tablet  2  . buPROPion (WELLBUTRIN) 75 MG tablet TAKE 1 TABLET TWICE A DAY  60 tablet  4  . clonazePAM (KLONOPIN) 0.5 MG tablet TAKE 1 TAB IN THE MORNING &   2 TABS AT BEDTIME (THIS MED IS "AS NEEDED" & NOT TAKEN ON REGULAR BASIS)  90 tablet  0  . finasteride (PROSCAR) 5 MG tablet Take 1 tablet (5 mg total) by mouth daily.  30 tablet  0  . furosemide (LASIX) 20 MG tablet Take 1 tablet (20 mg total) by mouth daily.  30 tablet  2  . hydrocortisone (ANUSOL-HC) 25 MG suppository Place 1 suppository (25 mg total) rectally 2 (two) times daily.  14 suppository  0  . multivitamin (METANX) 3-35-2 MG TABS tablet Take 1 tablet by mouth 2 (two) times daily.      .  tamsulosin (FLOMAX) 0.4 MG CAPS Take 1 capsule (0.4 mg total) by mouth daily.  30 capsule  0  . warfarin (COUMADIN) 5 MG tablet By mouth as directed: Take 1/2 tablet (2.5mg) tonight 12/20/12, 1 tablet (5mg) tomorrow 12/21/12, 1/2 (2.5mg) tablet on 12/22/12, then have INR checked on 12/23/12 to determine if dose needs adjusting.  30 tablet  2  . amiodarone (PACERONE) 200 MG tablet 1/2 tablet (total 100mg) daily  30 tablet  6  . metoprolol succinate (TOPROL-XL) 50 MG 24 hr tablet Take 1 tablet (50 mg total) by mouth 2 (two) times daily. Take with or immediately following a meal.  60 tablet  6   No current facility-administered medications for this visit.    BP 118/60  Pulse 110  Ht 5' 11.75" (1.822 m)  Wt 171 lb (77.565 kg)  BMI 23.37 kg/m2  SpO2 97% General: NAD Neck: No JVD, no thyromegaly or thyroid nodule.  Lungs: Clear to auscultation bilaterally with normal respiratory effort. CV: Nondisplaced PMI.  Heart tachy, irregular S1/S2, no S3/S4, no murmur.  1+ ankle edema bilaterally.  No carotid bruit.  Normal pedal pulses.  Abdomen: Soft, nontender, no hepatosplenomegaly, no distention.  Neurologic: Alert and oriented x 3.  Psych: Normal affect. Extremities: No clubbing or cyanosis.   Assessment/Plan: 1. Chronic systolic CHF: EF 25-30%.  Possible tachycardia-mediated CMP.  LHC showed 60% pLAD but suspect not hemodynamically signficant.  Today, he does not appear significantly volume overloaded.  We have held off on ACEI so far with renal dysfunction. - Today, will increase Toprol XL to 50 mg bid to help with rate control.  - Will re-assess EF by echo 2 months post-DCCV.  2. Atrial fibrillation: Still rapid heart rate.  Today, I will increase Toprol XL to 50 mg bid.  Continue amiodarone but will need to repeat LFTs and TSH (abnormal earlier this month).  He is on coumadin and has been therapeutic since 4/29.  I will check INR today and arrange for TEE-guided DCCV next week (history of possible LAA  thrombus) with point-of-care INR on the day of DCCV.   3. Elevated LFTs: LFTs were elevated at last hospital admission prior to starting amiodarone.  However, I will repeat LFTs today.  If rising, will have to consider cutting back or stopping amiodarone.   4. Elevated TSH: TSH was elevated when checked earlier this month.  Repeat today.  5. CAD: 60% pLAD stenosis on cath, doubt hemodynamic significance.  Continue coumadin, atorvastatin, Toprol XL.  Will need to follow LFTs carefully given statin use.  6. CKD: Creatinine improved with indwelling foley.  Patient to followup with urology regarding timing of foley discontinuation.   Gen Clagg 01/16/2013  

## 2013-01-21 ENCOUNTER — Ambulatory Visit (INDEPENDENT_AMBULATORY_CARE_PROVIDER_SITE_OTHER): Payer: Medicare Other | Admitting: *Deleted

## 2013-01-21 ENCOUNTER — Encounter (HOSPITAL_COMMUNITY): Payer: Self-pay | Admitting: Anesthesiology

## 2013-01-21 ENCOUNTER — Ambulatory Visit (HOSPITAL_COMMUNITY): Payer: Medicare Other | Admitting: Anesthesiology

## 2013-01-21 ENCOUNTER — Encounter (HOSPITAL_COMMUNITY)
Admission: RE | Disposition: A | Discharge status: Home / Self Care | Payer: Self-pay | Source: Ambulatory Visit | Attending: Cardiology

## 2013-01-21 ENCOUNTER — Other Ambulatory Visit: Payer: Self-pay | Admitting: Cardiology

## 2013-01-21 ENCOUNTER — Ambulatory Visit (HOSPITAL_COMMUNITY)
Admission: RE | Admit: 2013-01-21 | Discharge: 2013-01-21 | Disposition: A | Discharge status: Home / Self Care | Payer: Medicare Other | Source: Ambulatory Visit | Attending: Cardiology | Admitting: Cardiology

## 2013-01-21 ENCOUNTER — Encounter (HOSPITAL_COMMUNITY): Payer: Self-pay | Admitting: Gastroenterology

## 2013-01-21 DIAGNOSIS — I2510 Atherosclerotic heart disease of native coronary artery without angina pectoris: Secondary | ICD-10-CM | POA: Insufficient documentation

## 2013-01-21 DIAGNOSIS — I509 Heart failure, unspecified: Secondary | ICD-10-CM | POA: Insufficient documentation

## 2013-01-21 DIAGNOSIS — D649 Anemia, unspecified: Secondary | ICD-10-CM | POA: Insufficient documentation

## 2013-01-21 DIAGNOSIS — F410 Panic disorder [episodic paroxysmal anxiety] without agoraphobia: Secondary | ICD-10-CM | POA: Insufficient documentation

## 2013-01-21 DIAGNOSIS — I428 Other cardiomyopathies: Secondary | ICD-10-CM | POA: Insufficient documentation

## 2013-01-21 DIAGNOSIS — F3289 Other specified depressive episodes: Secondary | ICD-10-CM | POA: Insufficient documentation

## 2013-01-21 DIAGNOSIS — I10 Essential (primary) hypertension: Secondary | ICD-10-CM | POA: Insufficient documentation

## 2013-01-21 DIAGNOSIS — N400 Enlarged prostate without lower urinary tract symptoms: Secondary | ICD-10-CM | POA: Insufficient documentation

## 2013-01-21 DIAGNOSIS — I4891 Unspecified atrial fibrillation: Secondary | ICD-10-CM

## 2013-01-21 DIAGNOSIS — I059 Rheumatic mitral valve disease, unspecified: Secondary | ICD-10-CM | POA: Insufficient documentation

## 2013-01-21 DIAGNOSIS — F329 Major depressive disorder, single episode, unspecified: Secondary | ICD-10-CM | POA: Insufficient documentation

## 2013-01-21 DIAGNOSIS — Z87891 Personal history of nicotine dependence: Secondary | ICD-10-CM | POA: Insufficient documentation

## 2013-01-21 DIAGNOSIS — Z8679 Personal history of other diseases of the circulatory system: Secondary | ICD-10-CM | POA: Insufficient documentation

## 2013-01-21 DIAGNOSIS — E785 Hyperlipidemia, unspecified: Secondary | ICD-10-CM | POA: Insufficient documentation

## 2013-01-21 DIAGNOSIS — Z79899 Other long term (current) drug therapy: Secondary | ICD-10-CM | POA: Insufficient documentation

## 2013-01-21 DIAGNOSIS — Z7901 Long term (current) use of anticoagulants: Secondary | ICD-10-CM | POA: Insufficient documentation

## 2013-01-21 DIAGNOSIS — I5022 Chronic systolic (congestive) heart failure: Secondary | ICD-10-CM | POA: Insufficient documentation

## 2013-01-21 DIAGNOSIS — F411 Generalized anxiety disorder: Secondary | ICD-10-CM | POA: Insufficient documentation

## 2013-01-21 HISTORY — PX: TEE WITHOUT CARDIOVERSION: SHX5443

## 2013-01-21 HISTORY — PX: CARDIOVERSION: SHX1299

## 2013-01-21 LAB — BASIC METABOLIC PANEL
Calcium: 9 mg/dL (ref 8.4–10.5)
GFR calc non Af Amer: 73 mL/min — ABNORMAL LOW (ref >90)
Potassium: 4.1 mEq/L (ref 3.5–5.1)
Sodium: 138 mEq/L (ref 135–145)

## 2013-01-21 LAB — POCT INR: INR: 2.1

## 2013-01-21 SURGERY — ECHOCARDIOGRAM, TRANSESOPHAGEAL
Anesthesia: General

## 2013-01-21 SURGERY — CARDIOVERSION
Anesthesia: Monitor Anesthesia Care

## 2013-01-21 MED ORDER — PROPOFOL 10 MG/ML IV BOLUS
INTRAVENOUS | Status: DC | PRN
  Administered 2013-01-21: 50 mg via INTRAVENOUS

## 2013-01-21 MED ORDER — LIDOCAINE HCL (CARDIAC) 20 MG/ML IV SOLN
INTRAVENOUS | Status: DC | PRN
  Administered 2013-01-21: 50 mg via INTRAVENOUS

## 2013-01-21 MED ORDER — GLYCOPYRROLATE 0.2 MG/ML IJ SOLN
INTRAMUSCULAR | Status: AC
  Filled 2013-01-21: qty 1

## 2013-01-21 MED ORDER — SODIUM CHLORIDE 0.9 % IV SOLN
INTRAVENOUS | Status: DC | PRN
  Administered 2013-01-21: 15:00:00 via INTRAVENOUS

## 2013-01-21 MED ORDER — BUTAMBEN-TETRACAINE-BENZOCAINE 2-2-14 % EX AERO
INHALATION_SPRAY | CUTANEOUS | Status: DC | PRN
  Administered 2013-01-21: 2 via TOPICAL

## 2013-01-21 MED ORDER — SODIUM CHLORIDE 0.9 % IJ SOLN: 3.0000 mL | INTRAMUSCULAR | Status: DC | PRN

## 2013-01-21 MED ORDER — PHENYLEPHRINE HCL 10 MG/ML IJ SOLN
INTRAMUSCULAR | Status: DC | PRN
  Administered 2013-01-21 (×2): 40 ug via INTRAVENOUS

## 2013-01-21 MED ORDER — MIDAZOLAM HCL 5 MG/ML IJ SOLN
INTRAMUSCULAR | Status: AC
  Filled 2013-01-21: qty 2

## 2013-01-21 MED ORDER — GLYCOPYRROLATE 0.2 MG/ML IJ SOLN
INTRAMUSCULAR | Status: DC | PRN
  Administered 2013-01-21: 0.2 mg via INTRAVENOUS

## 2013-01-21 MED ORDER — SODIUM CHLORIDE 0.9 % IJ SOLN: 3.0000 mL | Freq: Two times a day (BID) | INTRAMUSCULAR | Status: DC

## 2013-01-21 MED ORDER — SODIUM CHLORIDE 0.9 % IV SOLN: 250.0000 mL | INTRAVENOUS | Status: DC

## 2013-01-21 MED ORDER — MIDAZOLAM HCL 10 MG/2ML IJ SOLN
INTRAMUSCULAR | Status: DC | PRN
  Administered 2013-01-21 (×3): 1 mg via INTRAVENOUS

## 2013-01-21 MED ORDER — FENTANYL CITRATE 0.05 MG/ML IJ SOLN
INTRAMUSCULAR | Status: AC
  Filled 2013-01-21: qty 2

## 2013-01-21 MED ORDER — HYDROCORTISONE 1 % EX CREA: 1.0000 "application " | TOPICAL_CREAM | Freq: Three times a day (TID) | CUTANEOUS | Status: DC | PRN

## 2013-01-21 MED ORDER — METOPROLOL SUCCINATE ER 25 MG PO TB24: 25.0000 mg | ORAL_TABLET | Freq: Two times a day (BID) | ORAL | Status: DC

## 2013-01-21 MED ORDER — FENTANYL CITRATE 0.05 MG/ML IJ SOLN
INTRAMUSCULAR | Status: DC | PRN
  Administered 2013-01-21 (×2): 25 ug via INTRAVENOUS

## 2013-01-21 NOTE — Anesthesia Postprocedure Evaluation (Signed)
  Anesthesia Post-op Note  Patient: [name]  Procedure(s) Performed: Procedure(s): TRANSESOPHAGEAL ECHOCARDIOGRAM (TEE) (N/A) CARDIOVERSION (N/A)  Patient Location: Endoscopy Unit  Anesthesia Type:General  Level of Consciousness: awake, alert , oriented and patient cooperative  Airway and Oxygen Therapy: Patient Spontanous Breathing and Patient connected to nasal cannula oxygen  Post-op Pain: none  Post-op Assessment: Post-op Vital signs reviewed, Patient's Cardiovascular Status Stable, Respiratory Function Stable, Patent Airway, No signs of Nausea or vomiting and Pain level controlled  Post-op Vital Signs: Reviewed and stable  Complications: No apparent anesthesia complications

## 2013-01-21 NOTE — H&P (View-Only) (Signed)
Patient ID: [name], male   DOB: 1925/01/31, 79 y.o.   MRN: [id number] PCP: Dr. Donette Larry (at [facility])  79 yo presents for cardiology followup after admission to [facility] with atrial fibrillation/RVR and acute systolic CHF.  He was admitted in 4/14 with atrial fibrillation/RVR and volume overloaded.  Echo showed EF 25-30%.  He had a LHC showed 60% pLAD stenosis that was thought to not be hemodynamically significant.  TEE showed possible LAA thrombus, so DCCV was not done.  He was started on amiodarone and Toprol XL for HR control with plan to anticoagulate for 4 weeks followed by DCCV.  While in the hospital, he had AKI due to postrenal obstruction (BPH) and now has an indwelling foley.  Urology if following.  Creatinine improved back to normal range.  Patient additionally has significant depression.  He was diuresed, rate controlled, anticoagulated, and discharged to [facility] nursing home.    [name] is symptomatically stable.  He still has the foley in.  HR is elevated today at rest.  He gets mildly winded walking with PT.  He is walking with a walker.  No chest pain.  No PND but does sleep with head inclined in hospital bed. INR has been therapeutic since 4/29.  Labs (4/14): AST 82, ALT 89 Labs (5/14): K 4.5, creatinine 1.2, HCT 38.4, AST 103, ALT 167, TSH 5.22 (elevated)  ECG: atrial fibrillation at rate 110 with LPFB.   Past Medical History:  1. Anemia 2. Colonic polyps  3. Hyperlipidemia  4. Hypertension  5. Skin cancer, hx of, Basal cell 2010, 1989  6. Right TKR  7. BPH  8. Panic attacks/generalized anxiety/depression 9. Presyncope in 2011: Echo (8/11) with EF 60%, normal RV size and systolic function, no significant valvular abnormalities. Carotid dopplers (8/11): minimal carotid stenosis. Event monitor (10/11) for 3 wks: episodes of sinus bradycardia with heart rate to low 40s occasionally at night (suspect while sleeping). No other significant events.  10. Atrial  fibrillation: First noted in 4/14.  Possible LAA thrombus on TEE in 4/14.  11. Systolic CHF: Nonischemic cardiomyopathy.  Echo (4/14) with EF 25-30%, diffuse hypokinesis with basal inferior and inferoseptal akinesis, moderate-severe MR, moderately dilated RV with moderately decreased systolic function, PA systolic pressure 43 mmHg.  LHC (4/14) with 60% pLAD stenosis thought not to be hemodynamically significant.  Possible tachycardia-mediated cardiomyopathy.   12. CAD: LHC (4/14) with 60% pLAD stenosis.  13. Elevated LFTs 14. Mitral regurgitation: Moderate to severe on echo in 4/14 but followup TEE showed only mild-moderate MR.  15. Post-renal AKI: Has indwelling foley now for BPH.   Family History:  Father: MI @ 46, CHF S/P TURP @ 68  Mother: d 85  Siblings: bro d 48 CVAs; bro d 73 CAD; bro d 81 lung CA   Social History:  Retired  Married  Former Smoker:quit 1985  Now living at [facility]  ROS: All systems reviewed and negative except as per HPI.   Current Outpatient Prescriptions  Medication Sig Dispense Refill  . acetaminophen (RA ARTHRITIS PAIN RELIEF) 650 MG CR tablet Take 1 tablet (650 mg total) by mouth every 6 (six) hours as needed for pain.      [name] atorvastatin (LIPITOR) 20 MG tablet Take 1 tablet (20 mg total) by mouth daily.  30 tablet  2  . buPROPion (WELLBUTRIN) 75 MG tablet TAKE 1 TABLET TWICE A DAY  60 tablet  4  . clonazePAM (KLONOPIN) 0.5 MG tablet TAKE 1 TAB IN THE MORNING &  2 TABS AT BEDTIME (THIS MED IS "AS NEEDED" & NOT TAKEN ON REGULAR BASIS)  90 tablet  0  . finasteride (PROSCAR) 5 MG tablet Take 1 tablet (5 mg total) by mouth daily.  30 tablet  0  . furosemide (LASIX) 20 MG tablet Take 1 tablet (20 mg total) by mouth daily.  30 tablet  2  . hydrocortisone (ANUSOL-HC) 25 MG suppository Place 1 suppository (25 mg total) rectally 2 (two) times daily.  14 suppository  0  . multivitamin (METANX) 3-35-2 MG TABS tablet Take 1 tablet by mouth 2 (two) times daily.      .  tamsulosin (FLOMAX) 0.4 MG CAPS Take 1 capsule (0.4 mg total) by mouth daily.  30 capsule  0  . warfarin (COUMADIN) 5 MG tablet By mouth as directed: Take 1/2 tablet (2.5mg ) tonight 12/20/12, 1 tablet (5mg ) tomorrow 12/21/12, 1/2 (2.5mg ) tablet on 12/22/12, then have INR checked on 12/23/12 to determine if dose needs adjusting.  30 tablet  2  . amiodarone (PACERONE) 200 MG tablet 1/2 tablet (total 100mg ) daily  30 tablet  6  . metoprolol succinate (TOPROL-XL) 50 MG 24 hr tablet Take 1 tablet (50 mg total) by mouth 2 (two) times daily. Take with or immediately following a meal.  60 tablet  6   No current facility-administered medications for this visit.    BP 118/60  Pulse 110  Ht 5' 11.75" (1.822 m)  Wt 171 lb (77.565 kg)  BMI 23.37 kg/m2  SpO2 97% General: NAD Neck: No JVD, no thyromegaly or thyroid nodule.  Lungs: Clear to auscultation bilaterally with normal respiratory effort. CV: Nondisplaced PMI.  Heart tachy, irregular S1/S2, no S3/S4, no murmur.  1+ ankle edema bilaterally.  No carotid bruit.  Normal pedal pulses.  Abdomen: Soft, nontender, no hepatosplenomegaly, no distention.  Neurologic: Alert and oriented x 3.  Psych: Normal affect. Extremities: No clubbing or cyanosis.   Assessment/Plan: 1. Chronic systolic CHF: EF 16-10%.  Possible tachycardia-mediated CMP.  LHC showed 60% pLAD but suspect not hemodynamically signficant.  Today, he does not appear significantly volume overloaded.  We have held off on ACEI so far with renal dysfunction. - Today, will increase Toprol XL to 50 mg bid to help with rate control.  - Will re-assess EF by echo 2 months post-DCCV.  2. Atrial fibrillation: Still rapid heart rate.  Today, I will increase Toprol XL to 50 mg bid.  Continue amiodarone but will need to repeat LFTs and TSH (abnormal earlier this month).  He is on coumadin and has been therapeutic since 4/29.  I will check INR today and arrange for TEE-guided DCCV next week (history of possible LAA  thrombus) with point-of-care INR on the day of DCCV.   3. Elevated LFTs: LFTs were elevated at last hospital admission prior to starting amiodarone.  However, I will repeat LFTs today.  If rising, will have to consider cutting back or stopping amiodarone.   4. Elevated TSH: TSH was elevated when checked earlier this month.  Repeat today.  5. CAD: 60% pLAD stenosis on cath, doubt hemodynamic significance.  Continue coumadin, atorvastatin, Toprol XL.  Will need to follow LFTs carefully given statin use.  6. CKD: Creatinine improved with indwelling foley.  Patient to followup with urology regarding timing of foley discontinuation.   [name] 01/16/2013

## 2013-01-21 NOTE — Transfer of Care (Signed)
Immediate Anesthesia Transfer of Care Note  Patient: [name]  Procedure(s) Performed: Procedure(s): TRANSESOPHAGEAL ECHOCARDIOGRAM (TEE) (N/A) CARDIOVERSION (N/A)  Patient Location: Endoscopy Unit  Anesthesia Type:General  Level of Consciousness: awake, alert , oriented and patient cooperative  Airway & Oxygen Therapy: Patient Spontanous Breathing and Patient connected to nasal cannula oxygen  Post-op Assessment: Report given to PACU RN, Post -op Vital signs reviewed and stable and Patient moving all extremities  Post vital signs: Reviewed and stable  Complications: No apparent anesthesia complications

## 2013-01-21 NOTE — CV Procedure (Signed)
Procedure: TEE  Indication: Atrial fibrillation, h/o LA appendage thrombus  Sedation: Versed 3 mg IV, Fentanyl 50 mcg IV  Findings: Please see report in echo section of chart for full detals.  Normal LV size with severe systolic dysfunction, EF 25-30% with global hypokinesis.  RV mildly dilated with mild-moderate systolic dysfunction.  Mild MR.  No LAA thrombus.  Moderate LAE.    No complications.   May proceed to DCCV.   [name] 01/21/2013 2:51 PM

## 2013-01-21 NOTE — Interval H&P Note (Signed)
History and Physical Interval Note:  01/21/2013 2:23 PM  [name]  has presented today for surgery, with the diagnosis of a-fib  The various methods of treatment have been discussed with the patient and family. After consideration of risks, benefits and other options for treatment, the patient has consented to  Procedure(s): TRANSESOPHAGEAL ECHOCARDIOGRAM (TEE) (N/A) CARDIOVERSION (N/A) as a surgical intervention .  The patient's history has been reviewed, patient examined, no change in status, stable for surgery.  I have reviewed the patient's chart and labs.  Questions were answered to the patient's satisfaction.     [name] [employer]

## 2013-01-21 NOTE — Progress Notes (Signed)
*  PRELIMINARY RESULTS* Echocardiogram Echocardiogram Transesophageal has been performed.  Jeryl Columbia 01/21/2013, 5:02 PM

## 2013-01-21 NOTE — Procedures (Signed)
Electrical Cardioversion Procedure Note [name] [id number] Jan 15, 1925  Procedure: Electrical Cardioversion Indications:  Atrial Fibrillation  Procedure Details Consent: Risks of procedure as well as the alternatives and risks of each were explained to the (patient/caregiver).  Consent for procedure obtained. Time Out: Verified patient identification, verified procedure, site/side was marked, verified correct patient position, special equipment/implants available, medications/allergies/relevent history reviewed, required imaging and test results available.  Performed  Patient placed on cardiac monitor, pulse oximetry, supplemental oxygen as necessary.  Sedation given: Propofol 50 mg IV per anesthesiology Pacer pads placed anterior and posterior chest.  Cardioverted 1 time(s).  Cardioverted at 200J.  Evaluation Findings: Post procedure EKG shows: NSR Complications: Initially patient was hypotensive, received 80 IV phenylephrine from anesthesiology.  When patient awakened, BP was stable and HR in upper 40s-50s.  Patient did tolerate procedure well.  Decrease Toprol XL to 25 mg bid.  Decrease amiodarone to 200 mg once daily.    [name] 01/21/2013, 2:59 PM

## 2013-01-21 NOTE — Anesthesia Preprocedure Evaluation (Addendum)
Anesthesia Evaluation  Patient identified by MRN, date of birth, ID band Patient awake    Reviewed: Allergy & Precautions, H&P , NPO status , Patient's Chart, lab work & pertinent test results  History of Anesthesia Complications Negative for: history of anesthetic complications  Airway Mallampati: II  Neck ROM: limited    Dental   Pulmonary Recent URI , Residual Cough,          Cardiovascular hypertension, Pt. on medications + CAD and +CHF + dysrhythmias Atrial Fibrillation + Valvular Problems/Murmurs MR  Ef 25-30 % mod to severe MR mod TR    Neuro/Psych PSYCHIATRIC DISORDERS Anxiety Depression    GI/Hepatic   Endo/Other    Renal/GU ARFRenal diseaseDue to obstructive process     Musculoskeletal   Abdominal   Peds  Hematology   Anesthesia Other Findings   Reproductive/Obstetrics                        Anesthesia Physical Anesthesia Plan  ASA: IV  Anesthesia Plan: General   Post-op Pain Management:    Induction: Intravenous  Airway Management Planned: Mask  Additional Equipment:   Intra-op Plan:   Post-operative Plan:   Informed Consent: I have reviewed the patients History and Physical, chart, labs and discussed the procedure including the risks, benefits and alternatives for the proposed anesthesia with the patient or authorized representative who has indicated his/her understanding and acceptance.     Plan Discussed with: CRNA and Anesthesiologist  Anesthesia Plan Comments:         Anesthesia Quick Evaluation

## 2013-01-22 ENCOUNTER — Other Ambulatory Visit: Payer: Self-pay | Admitting: *Deleted

## 2013-01-22 ENCOUNTER — Telehealth: Payer: Self-pay | Admitting: Cardiology

## 2013-01-22 ENCOUNTER — Telehealth: Payer: Self-pay | Admitting: *Deleted

## 2013-01-22 DIAGNOSIS — I5022 Chronic systolic (congestive) heart failure: Secondary | ICD-10-CM

## 2013-01-22 DIAGNOSIS — I4891 Unspecified atrial fibrillation: Secondary | ICD-10-CM

## 2013-01-22 NOTE — Telephone Encounter (Signed)
Spoke with patient's daughter about changes in amiodarone and Toprol XL . She has questions about billing during recent hospitalization. She states she has talked with [facility] billing and was told to call here. I cannot help her with these issues. I am going to forward to Transport planner, Debby Freiberg and [employer], Johnney Killian.

## 2013-01-22 NOTE — Telephone Encounter (Signed)
New problem     pts daughter has questions regarding pt being at [facility] 12/06/12(pt being billed for a private room) informed her that she needed to call billing & gave her the # to billing-she then stated she still needs someone to call her back because she has questions concerning his medication also

## 2013-01-22 NOTE — Telephone Encounter (Signed)
[name] (MRN [id number]) is currently in endo post procedure. He is being discharged shortly to nursing home. I spoke with Lanora Manis in endo and she confirmed the instructions from Dr. Shirlee Latch regarding medication changes were in his discharge from endo instructions. Pat ----- Message ----- From: Laurey Morale, MD    Please make sure Mr Nevares has followup with me in 1-2 weeks. He also needs to decrease amiodarone to 200 mg once daily and Toprol XL decrease to 25 mg bid.   This is from an In Basket message 01/21/13 from Dr Shirlee Latch.

## 2013-01-26 ENCOUNTER — Encounter (HOSPITAL_COMMUNITY): Payer: Self-pay | Admitting: Cardiology

## 2013-01-30 ENCOUNTER — Other Ambulatory Visit (HOSPITAL_COMMUNITY): Payer: Self-pay | Admitting: Unknown Physician Specialty

## 2013-01-31 ENCOUNTER — Ambulatory Visit: Payer: Medicare Other | Admitting: Cardiology

## 2013-02-04 ENCOUNTER — Telehealth: Payer: Self-pay | Admitting: *Deleted

## 2013-02-04 DIAGNOSIS — I5022 Chronic systolic (congestive) heart failure: Secondary | ICD-10-CM

## 2013-02-04 DIAGNOSIS — I4891 Unspecified atrial fibrillation: Secondary | ICD-10-CM

## 2013-02-04 MED ORDER — AMIODARONE HCL 200 MG PO TABS: 100.0000 mg | ORAL_TABLET | Freq: Every day | ORAL | Status: DC

## 2013-02-04 NOTE — Telephone Encounter (Signed)
Pt needs a refill on his Amiodarone

## 2013-02-07 NOTE — Telephone Encounter (Signed)
New Problem  He has done his evaluation. He is planning to see him 1 time a week for a week and 2 times a week for 3 weeks.   For energy strategy teaching and home health exercise program. He will discharge him once he feels he has reached the goals, unless you wish to discontinue this, or his caregivers wish to discontinue this.

## 2013-02-07 NOTE — Telephone Encounter (Signed)
Left a message that will send this evaluation note to MD and his nurse.

## 2013-02-10 ENCOUNTER — Encounter: Payer: Self-pay | Admitting: Internal Medicine

## 2013-02-10 ENCOUNTER — Telehealth: Payer: Self-pay | Admitting: *Deleted

## 2013-02-10 ENCOUNTER — Other Ambulatory Visit: Payer: Self-pay | Admitting: *Deleted

## 2013-02-10 DIAGNOSIS — L97509 Non-pressure chronic ulcer of other part of unspecified foot with unspecified severity: Secondary | ICD-10-CM | POA: Insufficient documentation

## 2013-02-10 MED ORDER — ATORVASTATIN CALCIUM 20 MG PO TABS: 20.0000 mg | ORAL_TABLET | Freq: Every day | ORAL | Status: DC

## 2013-02-10 NOTE — Telephone Encounter (Signed)
Late entry. Message was left Friday about when and if to check William [name]'s INR b/c he was not taking his taking his coumadin correctly. The message was verbally given to [name] of [facility] and she returned the call to [name] the [place] nurse. [name] left a message for [name] to call back at her convenience due to the fact we do not follow [name]'s INR in our office. It is/was followed by [facility].      [name], CMA

## 2013-02-10 NOTE — Telephone Encounter (Signed)
Spoke with Debbie.  Pt is no longer at [facility].  Unsure when he left but they started service on 6/17. He is currently prescribed Coumadin 2.5mg  daily.  She is due to go to his home tomorrow.  Will have her check INR at that time and adjust dose if necessary.

## 2013-02-11 ENCOUNTER — Encounter: Payer: Self-pay | Admitting: Internal Medicine

## 2013-02-11 ENCOUNTER — Ambulatory Visit (INDEPENDENT_AMBULATORY_CARE_PROVIDER_SITE_OTHER): Payer: Medicare Other | Admitting: Internal Medicine

## 2013-02-11 VITALS — BP 102/80 | HR 82 | Temp 97.8°F | Resp 14

## 2013-02-11 DIAGNOSIS — R279 Unspecified lack of coordination: Secondary | ICD-10-CM

## 2013-02-11 DIAGNOSIS — L97512 Non-pressure chronic ulcer of other part of right foot with fat layer exposed: Secondary | ICD-10-CM

## 2013-02-11 DIAGNOSIS — L97509 Non-pressure chronic ulcer of other part of unspecified foot with unspecified severity: Secondary | ICD-10-CM

## 2013-02-11 DIAGNOSIS — I4891 Unspecified atrial fibrillation: Secondary | ICD-10-CM

## 2013-02-11 DIAGNOSIS — I5023 Acute on chronic systolic (congestive) heart failure: Secondary | ICD-10-CM

## 2013-02-11 NOTE — Assessment & Plan Note (Signed)
Clinically he is back in atrial fibrillation; repeat cardioversion is not likely to be of benefit. Focus will be on rate control medically. This was discussed with him. His comprehension is questionable

## 2013-02-11 NOTE — Patient Instructions (Addendum)
Please review the medication list in the After Visit Summary provided.Please verify the medication name (this may be  brand or generic) & correct dosage. Write the name of the prescribing physician to the right of the medication and share this with all medical staff seen at each appointment. This will help provide continuity of care; help optimize therapeutic interventions;and help prevent drug:drug adverse reaction. Use an anti-inflammatory cream such as Aspercreme or Zostrix cream twice a day to the left hip & back as needed. In lieu of this warm moist compresses or  hot water bottle can be used. Do not apply ice .  The best intervention for the nocturnal hip and back pain would most likely be a new mattress such as the Tempurpedic

## 2013-02-11 NOTE — Progress Notes (Signed)
  Subjective:    Patient ID: [name], male    DOB: 1925-05-07, 79 y.o.   MRN: [id number]  HPI   His history was reviewed; he had acute on chronic systolic failure in the setting of atrial fibrillation with rapid ventricular response. After his cardioversion he was discharged to rehabilitation.  Apparently there've been multiple medication changes while at that facility.  I was contacted by Dr. Stacie Acres, podiatrist 6/23 concerning two ulcers of the right foot    Review of Systems  The home situation is being monitored by a sister-in-law and 24/7 healthcare aides but this would be a temporary situation  His wife has dementia and has been to the restroom twice in the last 2 weeks. Her MRI has shown progressive white matter disease     Objective:   Physical Exam  He appears chronically ill and malnourished  He is unshaven he  He has minimal rales at the bases with no increased work of breathing  He is back in atrial fibrillation with an irregular rhythm. Rate is not excessive  He has a flexible brace on the left knee.  Limited exam reveals negative straight leg raising of the right lower extremity. There is no pain with rotation of the  R hip  He has hyperpigmentation changes and exfoliation of the right lower extremity  Pedal pulses are present the decreased throughout. The strongest pulses the right dorsalis pedis pulse  He has 2 shallow ulcers with exposure of subcutaneous tissue. One is at the lateral aspect of the right anterior sole. The other is at the heel of the right foot. No active cellulitis is suggested            Assessment & Plan:  See Current Assessment & Plan in Problem List under specific Diagnosis

## 2013-02-11 NOTE — Assessment & Plan Note (Signed)
Clinically he does not appear to be in volume overload; he does have minimal rales at the bases. He is on low-dose furosemide

## 2013-02-11 NOTE — Assessment & Plan Note (Addendum)
Despite his stay in rehabilitation; he remains debilitated and @ high risk for fall. Perhaps he is a candidate for [facility] involvement. I strongly recommended he & his wife consider retirement facility to optimize medical support.

## 2013-02-14 ENCOUNTER — Ambulatory Visit (INDEPENDENT_AMBULATORY_CARE_PROVIDER_SITE_OTHER): Payer: Medicare Other | Admitting: Cardiovascular Disease

## 2013-02-14 DIAGNOSIS — Z7901 Long term (current) use of anticoagulants: Secondary | ICD-10-CM

## 2013-02-14 DIAGNOSIS — I4891 Unspecified atrial fibrillation: Secondary | ICD-10-CM

## 2013-02-14 LAB — POCT INR: INR: 3.8

## 2013-02-17 ENCOUNTER — Ambulatory Visit: Payer: Medicare Other | Admitting: Nurse Practitioner

## 2013-02-18 ENCOUNTER — Encounter: Payer: Self-pay | Admitting: Nurse Practitioner

## 2013-02-20 ENCOUNTER — Ambulatory Visit (INDEPENDENT_AMBULATORY_CARE_PROVIDER_SITE_OTHER): Payer: Medicare Other | Admitting: Internal Medicine

## 2013-02-20 ENCOUNTER — Other Ambulatory Visit: Payer: Self-pay | Admitting: *Deleted

## 2013-02-20 DIAGNOSIS — I4891 Unspecified atrial fibrillation: Secondary | ICD-10-CM

## 2013-02-20 DIAGNOSIS — Z7901 Long term (current) use of anticoagulants: Secondary | ICD-10-CM

## 2013-02-20 LAB — POCT INR: INR: 4

## 2013-02-20 MED ORDER — FUROSEMIDE 20 MG PO TABS: 20.0000 mg | ORAL_TABLET | Freq: Every day | ORAL | Status: DC

## 2013-02-20 MED ORDER — METOPROLOL SUCCINATE ER 50 MG PO TB24: ORAL_TABLET | ORAL | Status: DC

## 2013-02-25 ENCOUNTER — Telehealth: Payer: Self-pay | Admitting: Cardiology

## 2013-02-25 DIAGNOSIS — I4891 Unspecified atrial fibrillation: Secondary | ICD-10-CM

## 2013-02-25 DIAGNOSIS — I5022 Chronic systolic (congestive) heart failure: Secondary | ICD-10-CM

## 2013-02-25 MED ORDER — AMIODARONE HCL 200 MG PO TABS: 100.0000 mg | ORAL_TABLET | Freq: Every day | ORAL | Status: DC

## 2013-02-25 MED ORDER — PRAVASTATIN SODIUM 80 MG PO TABS: 80.0000 mg | ORAL_TABLET | Freq: Every day | ORAL | Status: DC

## 2013-02-25 NOTE — Telephone Encounter (Signed)
New problem    New rx for pravachol  80 mg one table daily.   Amiodarone  200 mg one tab daily,.   [facility] [address] [phone].

## 2013-02-27 ENCOUNTER — Ambulatory Visit (INDEPENDENT_AMBULATORY_CARE_PROVIDER_SITE_OTHER): Payer: Medicare Other | Admitting: Internal Medicine

## 2013-02-27 DIAGNOSIS — Z7901 Long term (current) use of anticoagulants: Secondary | ICD-10-CM

## 2013-02-27 DIAGNOSIS — I4891 Unspecified atrial fibrillation: Secondary | ICD-10-CM

## 2013-02-27 LAB — POCT INR: INR: 2.5

## 2013-02-27 MED ORDER — WARFARIN SODIUM 2.5 MG PO TABS: ORAL_TABLET | ORAL | Status: DC

## 2013-02-28 ENCOUNTER — Telehealth: Payer: Self-pay | Admitting: *Deleted

## 2013-02-28 NOTE — Telephone Encounter (Signed)
Disability paperwork completed for homebound status, family made aware available for pick up.

## 2013-03-03 ENCOUNTER — Telehealth: Payer: Self-pay | Admitting: Cardiology

## 2013-03-03 NOTE — Telephone Encounter (Signed)
New problem  Jae Dire wants to know if she can extend the physical therapy orders.  She said that the patient has some wounds on his heels and she wants to keep working with a little while longer.

## 2013-03-03 NOTE — Telephone Encounter (Signed)
I spoke with Jae Dire and the pt's PT was delayed due to foot ulcers.  She feels the pt is not ready to be discharged from PT.  Will extend order for a few more weeks and 2 visits per week.

## 2013-03-04 ENCOUNTER — Telehealth: Payer: Self-pay | Admitting: *Deleted

## 2013-03-04 MED ORDER — CLONAZEPAM 0.5 MG PO TABS: ORAL_TABLET | ORAL | Status: DC

## 2013-03-04 NOTE — Telephone Encounter (Signed)
OK X1 

## 2013-03-04 NOTE — Telephone Encounter (Signed)
Last OV 02-11-13, Last refilled 11-19-12 #90

## 2013-03-04 NOTE — Telephone Encounter (Signed)
Discuss with patient, Rx sent. 

## 2013-03-06 ENCOUNTER — Ambulatory Visit (INDEPENDENT_AMBULATORY_CARE_PROVIDER_SITE_OTHER): Payer: Medicare Other | Admitting: *Deleted

## 2013-03-06 DIAGNOSIS — I4891 Unspecified atrial fibrillation: Secondary | ICD-10-CM

## 2013-03-06 DIAGNOSIS — Z7901 Long term (current) use of anticoagulants: Secondary | ICD-10-CM

## 2013-03-12 ENCOUNTER — Ambulatory Visit (INDEPENDENT_AMBULATORY_CARE_PROVIDER_SITE_OTHER): Payer: Medicare Other | Admitting: Cardiology

## 2013-03-12 ENCOUNTER — Telehealth: Payer: Self-pay | Admitting: Cardiology

## 2013-03-12 DIAGNOSIS — I4891 Unspecified atrial fibrillation: Secondary | ICD-10-CM

## 2013-03-12 DIAGNOSIS — Z7901 Long term (current) use of anticoagulants: Secondary | ICD-10-CM

## 2013-03-12 NOTE — Telephone Encounter (Signed)
New Prob     Pts nurse states pts HR has been running 49-50 and fatigue. Would like to speak to nurse regarding this.

## 2013-03-12 NOTE — Telephone Encounter (Signed)
LMTCB

## 2013-03-14 NOTE — Telephone Encounter (Signed)
Still busy, will follow up Monday am

## 2013-03-14 NOTE — Telephone Encounter (Signed)
New Prob  Jae Dire wants you to know that pt is having some bradycardia and decrease in O2 with exercise. She said he is fine when he is at rest, would like to speak with you when you get a chance.

## 2013-03-14 NOTE — Telephone Encounter (Signed)
Spoke with Jae Dire. She did home PT with patient today. She states his O2 Sat dropped to 92-93% after walking today. His heart rate increased to 60 but after resting his radial heart rate was in the 48-52 range, his BP was 110/68 and 120/70.  She states in the past his heart rate has been in the 100-110 range and irregular. She is aware that I am forwarding this to Dr Patty Sermons for review.

## 2013-03-14 NOTE — Telephone Encounter (Signed)
Busy x 2- will try again later

## 2013-03-14 NOTE — Telephone Encounter (Signed)
Decrease Toprol to 25 mg daily.

## 2013-03-17 ENCOUNTER — Ambulatory Visit (INDEPENDENT_AMBULATORY_CARE_PROVIDER_SITE_OTHER): Payer: Medicare Other | Admitting: Cardiology

## 2013-03-17 ENCOUNTER — Telehealth: Payer: Self-pay | Admitting: Internal Medicine

## 2013-03-17 DIAGNOSIS — I4891 Unspecified atrial fibrillation: Secondary | ICD-10-CM

## 2013-03-17 DIAGNOSIS — Z7901 Long term (current) use of anticoagulants: Secondary | ICD-10-CM

## 2013-03-17 LAB — POCT INR: INR: 3.3

## 2013-03-17 NOTE — Telephone Encounter (Signed)
Diane with [facility] is calling in regards to the patient's Colace, Senokot, Toprol 50mg  medications. She needs to conform how the patient needs to be taking these. She also needs to know if he should still be taking Flomax, Doxazosin (Cardura). Please advise.

## 2013-03-18 NOTE — Telephone Encounter (Signed)
Continue 50 mg metoprolol. Use Lipitor 20 mg

## 2013-03-18 NOTE — Telephone Encounter (Signed)
Not here yesterday to follow up. Spoke with nurse today and she needed clarification on meds. Saturday heart rate 58 and irregular (apical), therapist yesterday got heart rate of 76 (radial). Wants to know what dose of Metoprolol patient should be on (getting 50 mg daily now). Also patient Rx list says Lipitor but patient has Pravastatin at home. Which should he be taking. Will forward to  Dr. Patty Sermons for review

## 2013-03-18 NOTE — Telephone Encounter (Signed)
Appointment needed to assess wound. Please review the medication list in the most recent After Visit Summary provided.Please verify the medication name (this may be  brand or generic) & correct dosage. Write the name of the prescribing physician to the right of the medication and share this with all medical staff seen at each appointment. This will help provide continuity of care; help optimize therapeutic interventions;and help prevent drug:drug adverse reaction.

## 2013-03-18 NOTE — Telephone Encounter (Signed)
Spoke with patient's wife and caregiver. The caregiver will have to check schedule to see when someone will be able to bring patient in. The scheduling staff was informed ok to use a same day slot to accommodate patient

## 2013-03-18 NOTE — Telephone Encounter (Signed)
Spoke with Diane in ref to patient's medications, Diane was instructed to contact Cardiologist and Urologist on some medications that was was in question for Dr.Hopper is not the perscriber on all medications. One concern was patient's Toprol has some hand-written instructions that say 1/2 by mouth twice daily- Diane will f/u with cardiology to clarify   Hopp please advise on New wound-? If patient needs appointment

## 2013-03-18 NOTE — Telephone Encounter (Signed)
Heart rate today 44, Metoprolol 50 mg 1/2 tablet twice a day per Tiffany caregiver sitting with Mr [place]. Will discuss with  Dr. Patty Sermons tomorrow.

## 2013-03-18 NOTE — Telephone Encounter (Signed)
Diane with [facility] is calling back in regards to the patient's medications.   Also, she wants Dr. Alwyn Ren to be aware that the patient has a new wound on his LT shin.

## 2013-03-20 ENCOUNTER — Ambulatory Visit (INDEPENDENT_AMBULATORY_CARE_PROVIDER_SITE_OTHER): Payer: Medicare Other | Admitting: Internal Medicine

## 2013-03-20 ENCOUNTER — Encounter: Payer: Self-pay | Admitting: Internal Medicine

## 2013-03-20 ENCOUNTER — Ambulatory Visit: Payer: Medicare Other | Admitting: Internal Medicine

## 2013-03-20 VITALS — BP 120/68 | HR 48 | Temp 97.5°F | Wt 170.2 lb

## 2013-03-20 DIAGNOSIS — L8990 Pressure ulcer of unspecified site, unspecified stage: Secondary | ICD-10-CM

## 2013-03-20 DIAGNOSIS — R609 Edema, unspecified: Secondary | ICD-10-CM

## 2013-03-20 DIAGNOSIS — L8992 Pressure ulcer of unspecified site, stage 2: Secondary | ICD-10-CM

## 2013-03-20 NOTE — Patient Instructions (Addendum)
Wound Care:Dip gauze in  sterile saline and applied to the wound twice a day. Cover the wound with Telfa , non stick dressing  without any antibiotic ointment. The saline can be purchased at the drugstore or you can make your own .Boil cup of salt in a gallon of water. Store mixture  in a clean container.Report Warning  signs as discussed (red streaks, pus, fever, increasing pain) Ensure or Boost, liquid protein supplement ,recommended daily.  Please see the urologist as soon as possible to prevent risk of serious urinary tract infection.

## 2013-03-20 NOTE — Telephone Encounter (Signed)
Discussed with  Dr. Patty Sermons and will have patient decrease Metoprolol 50 mg to 1/2 tablet, advised Shanetra, verbalized understanding.

## 2013-03-20 NOTE — Progress Notes (Signed)
  Subjective:    Patient ID: [name], male    DOB: Jul 06, 1925, 79 y.o.   MRN: [id number]  HPI  He has worn a brace on the left lower extremity because of the end-stage degenerative disease of the knee since 2002.  Earlier this week he complained of pain in the left anterior shin. With the brace was removed a ulcer was noted. This is tender to touch. There's been no purulence, fever, chills or sweats  Review of Systems  He does not believe he is able to ambulate without the brace. It has also caused some bruising in the left suprapatellar area  He has had sediment in his Foley; he has refused to see the urologist. He actually had an appointment this week with Dr. Berneice Heinrich     Objective:   Physical Exam  He is in the wheelchair.He appears poorly nourished & unshaven.  He has fusiform enlargement of the knees. There is a curvilinear ecchymosis about the left knee.  He has pitting edema bilaterally. He has stasis dermatitis over the lower shins  Pedal pulses are present but decreased  He has a 7 x 7 mm shallow ulcer over the superior left shin. There is no evidence of cellulitis or purulence.        Assessment & Plan:  #1 pressure ulcer from his brace  #2 edema most likely related to low albumin #3 sediment in Foley  Plan: See wound care or recommendations. He should not wear the brace until the ulcer is healed. It is recommended that he be seen by orthopedics to see if there is a safer brace. Protein supplementation Urology appt ASAP

## 2013-03-20 NOTE — Telephone Encounter (Signed)
Left message to call back  

## 2013-03-21 ENCOUNTER — Other Ambulatory Visit (HOSPITAL_COMMUNITY): Payer: Medicare Other

## 2013-03-24 ENCOUNTER — Telehealth: Payer: Self-pay | Admitting: Internal Medicine

## 2013-03-24 NOTE — Telephone Encounter (Signed)
Spoke with patient's daughter, explained OV from 03/20/13.

## 2013-03-24 NOTE — Telephone Encounter (Signed)
Patient's daughter, Jordanny Waddington is called to make an appointment for the patient for a wound on his LT shin. She states that his brace caused this new wound. After verifying that she was on his DPR,  I advised her that the patient was already seen for this issue last week. She then asked me what had been done at the issue or if something had been prescribed? I told her that I did not feel comfortable reading his office visit notes to her, but to ask the patient or whoever accompanied him at his appointment. She then asked me if a nurse in our office could call her to tell her instead. Please advise.

## 2013-03-25 ENCOUNTER — Other Ambulatory Visit (HOSPITAL_COMMUNITY): Payer: Medicare Other

## 2013-03-26 ENCOUNTER — Other Ambulatory Visit: Payer: Self-pay

## 2013-03-26 ENCOUNTER — Ambulatory Visit (HOSPITAL_COMMUNITY): Payer: Medicare Other | Attending: Cardiology

## 2013-03-26 ENCOUNTER — Ambulatory Visit (INDEPENDENT_AMBULATORY_CARE_PROVIDER_SITE_OTHER): Payer: Medicare Other | Admitting: Pharmacist

## 2013-03-26 DIAGNOSIS — I5022 Chronic systolic (congestive) heart failure: Secondary | ICD-10-CM

## 2013-03-26 DIAGNOSIS — I4891 Unspecified atrial fibrillation: Secondary | ICD-10-CM

## 2013-03-26 DIAGNOSIS — I509 Heart failure, unspecified: Secondary | ICD-10-CM

## 2013-03-26 DIAGNOSIS — Z7901 Long term (current) use of anticoagulants: Secondary | ICD-10-CM

## 2013-03-28 ENCOUNTER — Other Ambulatory Visit: Payer: Self-pay | Admitting: *Deleted

## 2013-03-28 DIAGNOSIS — I4891 Unspecified atrial fibrillation: Secondary | ICD-10-CM

## 2013-03-31 ENCOUNTER — Telehealth: Payer: Self-pay | Admitting: *Deleted

## 2013-03-31 NOTE — Telephone Encounter (Signed)
Left message for [name] to call and schedule an appointment for her mom to receive an 48 hour holter monitor per Dr. Shirlee Latch.

## 2013-04-01 NOTE — Telephone Encounter (Signed)
48 hour monitor scheduled for 04/02/13 @ 2:30

## 2013-04-02 ENCOUNTER — Encounter (INDEPENDENT_AMBULATORY_CARE_PROVIDER_SITE_OTHER): Payer: Medicare Other

## 2013-04-02 DIAGNOSIS — I4891 Unspecified atrial fibrillation: Secondary | ICD-10-CM

## 2013-04-07 ENCOUNTER — Other Ambulatory Visit: Payer: Self-pay | Admitting: Internal Medicine

## 2013-04-08 NOTE — Telephone Encounter (Signed)
Patient aware that he needs to come in to leave a urine specimen for UDS prior to additional refills. Contract was signed 08/28/2012

## 2013-04-09 ENCOUNTER — Ambulatory Visit (INDEPENDENT_AMBULATORY_CARE_PROVIDER_SITE_OTHER): Payer: Medicare Other | Admitting: *Deleted

## 2013-04-09 ENCOUNTER — Ambulatory Visit: Payer: Medicare Other | Admitting: Cardiology

## 2013-04-09 ENCOUNTER — Ambulatory Visit (INDEPENDENT_AMBULATORY_CARE_PROVIDER_SITE_OTHER): Payer: Medicare Other | Admitting: Cardiology

## 2013-04-09 ENCOUNTER — Encounter: Payer: Self-pay | Admitting: Cardiology

## 2013-04-09 VITALS — BP 136/60 | HR 54 | Ht 72.0 in | Wt 178.8 lb

## 2013-04-09 DIAGNOSIS — N179 Acute kidney failure, unspecified: Secondary | ICD-10-CM

## 2013-04-09 DIAGNOSIS — I4891 Unspecified atrial fibrillation: Secondary | ICD-10-CM

## 2013-04-09 DIAGNOSIS — I509 Heart failure, unspecified: Secondary | ICD-10-CM

## 2013-04-09 DIAGNOSIS — I5022 Chronic systolic (congestive) heart failure: Secondary | ICD-10-CM

## 2013-04-09 DIAGNOSIS — E785 Hyperlipidemia, unspecified: Secondary | ICD-10-CM

## 2013-04-09 DIAGNOSIS — Z7901 Long term (current) use of anticoagulants: Secondary | ICD-10-CM

## 2013-04-09 MED ORDER — LISINOPRIL 5 MG PO TABS: 5.0000 mg | ORAL_TABLET | Freq: Every day | ORAL | Status: DC

## 2013-04-09 MED ORDER — FUROSEMIDE 40 MG PO TABS: 40.0000 mg | ORAL_TABLET | Freq: Every day | ORAL | Status: DC

## 2013-04-09 NOTE — Patient Instructions (Addendum)
Increase lasix(furosemide) to 40mg  daily. You can take 2 of your 20mg  tablets daily at the same time and use your current supply.  Start lisinopril 5mg  daily.  Your physician recommends that you return for a FASTING lipid profile /liver profile/TSH/BMET/BNP in 1 week.  Your physician recommends that you schedule a follow-up appointment in: 4-5 weeks with Dr Shirlee Latch.  Your physician has requested that you have an echocardiogram. Echocardiography is a painless test that uses sound waves to create images of your heart. It provides your doctor with information about the size and shape of your heart and how well your heart's chambers and valves are working. This procedure takes approximately one hour. There are no restrictions for this procedure. IN 4-5 weeks--This can be done the same day you see Dr Shirlee Latch in 4-5 weeks.

## 2013-04-10 NOTE — Progress Notes (Signed)
Patient ID: [name], male   DOB: 1924/12/11, 79 y.o.   MRN: [id number] PCP: Dr. Alwyn Ren  79 yo presents for cardiology followup after admission in the Spring to [facility] with atrial fibrillation/RVR and acute systolic CHF.  He was admitted in 4/14 with atrial fibrillation/RVR and volume overloaded.  Echo showed EF 25-30%.  He had a LHC showed 60% pLAD stenosis that was thought to not be hemodynamically significant.  TEE showed possible LAA thrombus, so DCCV was not done initially.  He was started on amiodarone and Toprol XL for HR control with plan to anticoagulate for 4 weeks followed by DCCV.  While in the hospital, he had AKI due to postrenal obstruction (BPH) and now has an indwelling foley.  Urology is following.  Creatinine improved back to normal range.  Patient additionally has significant depression.  He was diuresed, rate controlled, anticoagulated, and discharged to [facility] nursing home.  In 6/14, he had TEE-guided DCCV to NSR.  The TEE showed no LAA thrombus and EF remained 25-30%.  He is now home from the nursing home.  At recent coumadin visit, he was noted to be back in atrial fibrillation, but he is back in NSR today.  Toprol XL dose has been decreased due to bradycardia in the 40s.   Mr Aldrete is symptomatically stable.  He still has the foley in.   He has 24 hour care at home.  He is walking with a walker around the house, not really getting dyspneic in the house.  No chest pain.  No PND but does sleep with 3 pillows.  No lightheadedness, syncope, or falls.  No tachypalpitations.  He has an ulcer on his right foot and is seeing podiatry.  This has been limiting his PT.   Labs (4/14): AST 82, ALT 89 Labs (5/14): K 4.5, creatinine 1.2, HCT 38.4, LFTs normal, TSH normal Labs (6/14): K 4.1, creatinine 0.96  ECG: NSR, 1st degree AV block, LVH, inferior Qs  Past Medical History:  1. Anemia 2. Colonic polyps  3. Hyperlipidemia  4. Hypertension  5. Skin cancer, hx of, Basal cell  2010, 1989  6. Right TKR  7. BPH  8. Panic attacks/generalized anxiety/depression 9. Presyncope in 2011: Echo (8/11) with EF 60%, normal RV size and systolic function, no significant valvular abnormalities. Carotid dopplers (8/11): minimal carotid stenosis. Event monitor (10/11) for 3 wks: episodes of sinus bradycardia with heart rate to low 40s occasionally at night (suspect while sleeping). No other significant events.  10. Atrial fibrillation: First noted in 4/14.  Possible LAA thrombus on TEE in 4/14.  TEE (6/14) with no LAA thrombus.  DCCV to NSR in 6/14. Patient on amiodarone.  11. Systolic CHF: Nonischemic cardiomyopathy.  Echo (4/14) with EF 25-30%, diffuse hypokinesis with basal inferior and inferoseptal akinesis, moderate-severe MR, moderately dilated RV with moderately decreased systolic function, PA systolic pressure 43 mmHg.  LHC (4/14) with 60% pLAD stenosis thought not to be hemodynamically significant.  Possible tachycardia-mediated cardiomyopathy.  TEE (6/14) with EF 25-30%, global hypokinesis, mild MR.  12. CAD: LHC (4/14) with 60% pLAD stenosis.  13. Elevated LFTs 14. Mitral regurgitation: Moderate to severe on echo in 4/14 but followup TEE showed only mild-moderate MR.  TEE (6/14) with mild MR.  15. Post-renal AKI: Has indwelling foley now for BPH.   Family History:  Father: MI @ 53, CHF S/P TURP @ 63  Mother: d 67  Siblings: bro d 65 CVAs; bro d 45 CAD; bro d 79  lung CA   Social History:  Retired  Married  Former Smoker:quit 1985   ROS: All systems reviewed and negative except as per HPI.   Current Outpatient Prescriptions  Medication Sig Dispense Refill  . acetaminophen (RA ARTHRITIS PAIN RELIEF) 650 MG CR tablet Take 1 tablet (650 mg total) by mouth every 6 (six) hours as needed for pain.      [name] amiodarone (PACERONE) 200 MG tablet Take 0.5 tablets (100 mg total) by mouth daily.  30 tablet  1  . buPROPion (WELLBUTRIN) 75 MG tablet TAKE 1 TABLET TWICE A DAY  60 tablet   4  . clonazePAM (KLONOPIN) 0.5 MG tablet TAKE 1 TAB IN THE MORNING & 2 TABS AT BEDTIME (THIS MED IS "AS NEEDED" & NOT TAKEN ON REGULAR BASIS)  90 tablet  0  . docusate sodium (COLACE) 100 MG capsule Take 100 mg by mouth 2 (two) times daily.      . finasteride (PROSCAR) 5 MG tablet Take 1 tablet (5 mg total) by mouth daily.  30 tablet  0  . L-Methylfolate-B12-B6-B2 (L-METHYL-MC PO) Take by mouth. B6/B12 AM and PM      . metoprolol succinate (TOPROL-XL) 50 MG 24 hr tablet Take 50 mg by mouth as directed. 1/2 tablet daily      . Multiple Vitamin (MULTIVITAMIN) tablet Take 1 tablet by mouth daily.      . pravastatin (PRAVACHOL) 80 MG tablet Take 80 mg by mouth daily. Night time      . Probiotic Product (ALIGN) 4 MG CAPS Take 1 capsule by mouth.      . warfarin (COUMADIN) 2.5 MG tablet Take as directed by anticoagulation clinic  90 tablet  1  . furosemide (LASIX) 40 MG tablet Take 1 tablet (40 mg total) by mouth daily.  30 tablet  3  . lisinopril (PRINIVIL,ZESTRIL) 5 MG tablet Take 1 tablet (5 mg total) by mouth daily.  30 tablet  3   No current facility-administered medications for this visit.    BP 136/60  Pulse 54  Ht 6' (1.829 m)  Wt 81.103 kg (178 lb 12.8 oz)  BMI 24.24 kg/m2 General: NAD, chronically ill appearing Neck: JVP 8 cm, no thyromegaly or thyroid nodule.  Lungs: Clear to auscultation bilaterally with normal respiratory effort. CV: Nondisplaced PMI.  Heart tachy, irregular S1/S2, no S3/S4, no murmur.  1+ edema 1/4 up lower legs bilaterally.  No carotid bruit.  Abdomen: Soft, nontender, no hepatosplenomegaly, no distention.  Neurologic: Alert and oriented x 3.  Psych: Normal affect. Extremities: No clubbing or cyanosis.   Assessment/Plan: 1. Chronic systolic CHF: EF 11-91%.  Possible tachycardia-mediated CMP.  LHC showed 60% pLAD but suspect not hemodynamically signficant.  Initially no ACEI with post-renal AKI but creatinine now back to normal.  He is mildly volume overloaded  on exam today.  Toprol XL was cut back due to bradycardia now that he is in NSR. - Continue Toprol XL at 25 mg daily as HR is often in the 40s (no lightheadedness).  - Increase Lasix to 40 mg daily with BMET/BNP in 1 week. - Add lisinopril 5 mg daily.  - Will re-assess EF by echo in 9/14 while in back in NSR.  2. Atrial fibrillation: In NSR today with HR 54 after DCCV.  HR runs low at times so Toprol XL cut back. He remains on amiodarone at 100 mg daily.  Last LFTs and TSH were normal.  - Check LFTs/TSH with labs in 1 week.  -  Needs yearly eye exam while on amiodarone.    3. CAD: 60% pLAD stenosis on cath, doubt hemodynamic significance.  Continue coumadin, atorvastatin, Toprol XL.   4. CKD: Creatinine improved with indwelling foley.  Patient to followup with urology regarding timing of foley discontinuation.  5. Hyperlipidemia: Check lipids today.   [name] 04/10/2013

## 2013-04-14 ENCOUNTER — Other Ambulatory Visit: Payer: Self-pay | Admitting: Internal Medicine

## 2013-04-15 ENCOUNTER — Other Ambulatory Visit (INDEPENDENT_AMBULATORY_CARE_PROVIDER_SITE_OTHER): Payer: Medicare Other

## 2013-04-15 DIAGNOSIS — I4891 Unspecified atrial fibrillation: Secondary | ICD-10-CM

## 2013-04-15 DIAGNOSIS — I5022 Chronic systolic (congestive) heart failure: Secondary | ICD-10-CM

## 2013-04-15 LAB — BASIC METABOLIC PANEL
BUN: 27 mg/dL — ABNORMAL HIGH (ref 6–23)
CO2: 27 mEq/L (ref 19–32)
GFR: 52.3 mL/min — ABNORMAL LOW (ref >60.00)
Glucose, Bld: 159 mg/dL — ABNORMAL HIGH (ref 70–99)
Potassium: 4.3 mEq/L (ref 3.5–5.1)

## 2013-04-15 LAB — LIPID PANEL
HDL: 42.8 mg/dL (ref >39.00)
LDL Cholesterol: 24 mg/dL (ref 0–99)
Total CHOL/HDL Ratio: 2
Triglycerides: 98 mg/dL (ref 0.0–149.0)
VLDL: 19.6 mg/dL (ref 0.0–40.0)

## 2013-04-15 LAB — HEPATIC FUNCTION PANEL
AST: 29 U/L (ref 0–37)
Albumin: 3.1 g/dL — ABNORMAL LOW (ref 3.5–5.2)
Total Bilirubin: 0.3 mg/dL (ref 0.3–1.2)

## 2013-04-15 LAB — TSH: TSH: 3.93 u[IU]/mL (ref 0.35–5.50)

## 2013-04-16 ENCOUNTER — Other Ambulatory Visit: Payer: Self-pay | Admitting: *Deleted

## 2013-04-16 DIAGNOSIS — I4891 Unspecified atrial fibrillation: Secondary | ICD-10-CM

## 2013-04-16 DIAGNOSIS — I5022 Chronic systolic (congestive) heart failure: Secondary | ICD-10-CM

## 2013-04-23 ENCOUNTER — Encounter (HOSPITAL_COMMUNITY): Payer: Self-pay

## 2013-04-23 ENCOUNTER — Emergency Department (HOSPITAL_COMMUNITY)
Admission: EM | Admit: 2013-04-23 | Discharge: 2013-04-23 | Disposition: A | Discharge status: Home / Self Care | Payer: Medicare Other | Attending: Emergency Medicine | Admitting: Emergency Medicine

## 2013-04-23 ENCOUNTER — Other Ambulatory Visit: Payer: Medicare Other

## 2013-04-23 DIAGNOSIS — Z86718 Personal history of other venous thrombosis and embolism: Secondary | ICD-10-CM | POA: Insufficient documentation

## 2013-04-23 DIAGNOSIS — I5020 Unspecified systolic (congestive) heart failure: Secondary | ICD-10-CM | POA: Insufficient documentation

## 2013-04-23 DIAGNOSIS — Z862 Personal history of diseases of the blood and blood-forming organs and certain disorders involving the immune mechanism: Secondary | ICD-10-CM | POA: Insufficient documentation

## 2013-04-23 DIAGNOSIS — F410 Panic disorder [episodic paroxysmal anxiety] without agoraphobia: Secondary | ICD-10-CM | POA: Insufficient documentation

## 2013-04-23 DIAGNOSIS — Z79899 Other long term (current) drug therapy: Secondary | ICD-10-CM | POA: Insufficient documentation

## 2013-04-23 DIAGNOSIS — T839XXA Unspecified complication of genitourinary prosthetic device, implant and graft, initial encounter: Secondary | ICD-10-CM

## 2013-04-23 DIAGNOSIS — Z4682 Encounter for fitting and adjustment of non-vascular catheter: Secondary | ICD-10-CM | POA: Insufficient documentation

## 2013-04-23 DIAGNOSIS — Z87448 Personal history of other diseases of urinary system: Secondary | ICD-10-CM | POA: Insufficient documentation

## 2013-04-23 DIAGNOSIS — I10 Essential (primary) hypertension: Secondary | ICD-10-CM | POA: Insufficient documentation

## 2013-04-23 DIAGNOSIS — Z7901 Long term (current) use of anticoagulants: Secondary | ICD-10-CM | POA: Insufficient documentation

## 2013-04-23 DIAGNOSIS — Z8589 Personal history of malignant neoplasm of other organs and systems: Secondary | ICD-10-CM | POA: Insufficient documentation

## 2013-04-23 DIAGNOSIS — E785 Hyperlipidemia, unspecified: Secondary | ICD-10-CM | POA: Insufficient documentation

## 2013-04-23 DIAGNOSIS — I2510 Atherosclerotic heart disease of native coronary artery without angina pectoris: Secondary | ICD-10-CM | POA: Insufficient documentation

## 2013-04-23 DIAGNOSIS — Z860100 Personal history of colon polyps, unspecified: Secondary | ICD-10-CM | POA: Insufficient documentation

## 2013-04-23 DIAGNOSIS — Z87828 Personal history of other (healed) physical injury and trauma: Secondary | ICD-10-CM | POA: Insufficient documentation

## 2013-04-23 DIAGNOSIS — Z87891 Personal history of nicotine dependence: Secondary | ICD-10-CM | POA: Insufficient documentation

## 2013-04-23 NOTE — ED Notes (Signed)
Bed: WA08 Expected date:  Expected time:  Means of arrival:  Comments: EMS, foley cath dislodged, blood in urine

## 2013-04-23 NOTE — ED Notes (Signed)
Per EMS, pt is from home.  Caretaker noticed blood in foley bag.  Per caretaker, cath is dislodged.  Caretaker did not manipulate or remove cath.  Called MD and told to come to ER to replace cath.  Pt denies pain.  Vitals:  130/74, hr 62, resp 18

## 2013-04-23 NOTE — ED Notes (Signed)
Replaced foley bag with new bag to promote drainage.  Previous bag not draining from tubing.

## 2013-04-23 NOTE — ED Provider Notes (Addendum)
CSN: [id number]     Arrival date & time 04/23/13  0223 History   First MD Initiated Contact with Patient 04/23/13 [phone]     Chief Complaint  Patient presents with  . Foley Displacement    (Consider location/radiation/quality/duration/timing/severity/associated sxs/prior Treatment) The history is provided by the patient.  pt with chronic foley catheter. Caregiver notes this evening appeared as though urine stopped draining into bag, and pt w suprapubic discomfort, mild, dull.  No fever or chills. States had cath just recently replaced/changed at urologist office last week. States otherwise recent health at baseline.    Past Medical History  Diagnosis Date  . Anemia     nos  . History of colonic polyps   . Hyperlipidemia   . Hypertension   . Cancer [phone]    hx of basal cell  . BPH (benign prostatic hyperplasia)   . Anxiety attack     panic   . Pre-syncope     echo 03/2010,with EF 60%,mild RV dilation,no significant abnormalities...event monitor for 3wks/episodes os sinus bradycardia with heart rate to low 40's occasionally at night(suspect while sleeping)  . Atrial fibrillation     a. Dx 11/2012 - TEE with possible LAA thrombus, started on amio/coumadin.  . Systolic CHF     a. EF 25-30% 0/2725 ?tachy-mediated. b. Not on ACEI at dc due to AKI, can consider as OP.  [name] CAD (coronary artery disease)     a. Mod CAD by cath 12/10/12 - 60% long proximal LAD, 60% prox PDA.  [name] AKI (acute kidney injury)     a. Peak Cr 4.17 11/2012 - felt to be post-obstructive.  . Urinary retention     a. Post obstructive, dc'd with foley 12/2012.  [name] Renal cyst     a. By renal US 11/2012, not candidate for further eval.  . Thrombus of left atrial appendage     a. Possible LAA thrombus by TEE 11/2012.   Past Surgical History  Procedure Laterality Date  . Colonoscopy w/ polypectomy  2007  . Cholecystectomy    . Total knee arthroplasty    . Mohs surgery  2010  . Tee without cardioversion N/A 12/11/2012   Procedure: TRANSESOPHAGEAL ECHOCARDIOGRAM (TEE);  Surgeon: Vesta Mixer, MD;  Location: [facility] ENDOSCOPY;  Service: Cardiovascular;  Laterality: N/A;  ja/bev.  Rhae Hammock without cardioversion N/A 01/21/2013    Procedure: TRANSESOPHAGEAL ECHOCARDIOGRAM (TEE);  Surgeon: Laurey Morale, MD;  Location: [facility] ENDOSCOPY;  Service: Cardiovascular;  Laterality: N/A;  . Cardioversion N/A 01/21/2013    Procedure: CARDIOVERSION;  Surgeon: Laurey Morale, MD;  Location: [facility] ENDOSCOPY;  Service: Cardiovascular;  Laterality: N/A;   Family History  Problem Relation Age of Onset  . Heart attack Father   . Heart failure Father 17    S/P Turp  . Heart disease Father 66    MI.Marland KitchenMarland KitchenCHF S/P TURP @ 70  . Stroke Brother 22  . Coronary artery disease Brother   . Lung cancer Brother    History  Substance Use Topics  . Smoking status: Former Smoker    Quit date: 08/22/1983  . Smokeless tobacco: Not on file  . Alcohol Use: No    Review of Systems  Constitutional: Negative for fever and chills.  Respiratory: Negative for shortness of breath.   Gastrointestinal: Negative for nausea, vomiting and abdominal distention.  Genitourinary: Negative for dysuria and flank pain.    Allergies  Darifenacin hydrobromide and Fluoxetine hcl  Home Medications   Current Outpatient Rx  Name  Route  Sig  Dispense  Refill  . amiodarone (PACERONE) 200 MG tablet   Oral   Take 0.5 tablets (100 mg total) by mouth daily.   30 tablet   1   . buPROPion (WELLBUTRIN) 75 MG tablet      TAKE 1 TABLET TWICE A DAY   60 tablet   4   . clonazePAM (KLONOPIN) 0.5 MG tablet   Oral   Take 0.5-1 mg by mouth 2 (two) times daily as needed for anxiety. Take 1 tablet in the mornings Take 2 tablets at bedtime         . docusate sodium (COLACE) 100 MG capsule   Oral   Take 100 mg by mouth 2 (two) times daily.         . finasteride (PROSCAR) 5 MG tablet   Oral   Take 1 tablet (5 mg total) by mouth daily.   30 tablet   0     Follow up  with urologist for refills on this medic ...   . furosemide (LASIX) 20 MG tablet   Oral   Take 40 mg by mouth daily.         [name] L-Methylfolate-B12-B6-B2 (L-METHYL-MC PO)   Oral   Take 1 tablet by mouth 2 (two) times daily.          [name] lisinopril (PRINIVIL,ZESTRIL) 2.5 MG tablet   Oral   Take 2.5 mg by mouth daily. 1/2 of a 5mg  tablet daily         . metoprolol succinate (TOPROL-XL) 50 MG 24 hr tablet   Oral   Take 25 mg by mouth daily. 1/2 tablet daily         . Multiple Vitamin (MULTIVITAMIN) tablet   Oral   Take 1 tablet by mouth daily.         . pravastatin (PRAVACHOL) 80 MG tablet   Oral   Take 80 mg by mouth every evening. Night time         . Probiotic Product (ALIGN) 4 MG CAPS   Oral   Take 1 capsule by mouth.         . warfarin (COUMADIN) 2.5 MG tablet   Oral   Take 1.25-2.5 mg by mouth daily. Take 1 tablet Sunday,Tuesday,Thursday, & Saturday Take 0.5 tablet on all other days         . acetaminophen (RA ARTHRITIS PAIN RELIEF) 650 MG CR tablet   Oral   Take 1 tablet (650 mg total) by mouth every 6 (six) hours as needed for pain.          BP 139/58  Pulse 52  Temp(Src) 98.1 F (36.7 C) (Oral)  Resp 20  SpO2 98% Physical Exam  Nursing note and vitals reviewed. Constitutional: No distress.  HENT:  Mouth/Throat: Oropharynx is clear and moist.  Eyes: Conjunctivae are normal. Pupils are equal, round, and reactive to light.  Neck: Neck supple. No tracheal deviation present.  Cardiovascular: Normal rate.   Pulmonary/Chest: Effort normal. No accessory muscle usage. No respiratory distress.  Abdominal: Soft. He exhibits no distension and no mass. There is no tenderness. There is no rebound and no guarding.  Genitourinary:  Normal ext genitalia. Mild irritation at meatus. No pus or purulent drainage. Clear urine draining into bag.   Musculoskeletal: Normal range of motion.  Neurological: He is alert.  Skin: Skin is warm and dry. He is not  diaphoretic.  Psychiatric: He has a normal mood and affect.  ED Course  Procedures (including critical care time)  MDM  Foley bag replaced, clear/non bloody urine draining freely into bag. ?whether old bag was occluded at site of tube entering bag.  approx 400 cc urine in bag since this evening.   Pt asymptomatic, abd soft nt, no fevers, no blood in urine. Pt appears stable for d/c.        Suzi Roots, MD 04/23/13 [phone]

## 2013-04-24 ENCOUNTER — Other Ambulatory Visit (INDEPENDENT_AMBULATORY_CARE_PROVIDER_SITE_OTHER): Payer: Medicare Other

## 2013-04-24 ENCOUNTER — Ambulatory Visit (INDEPENDENT_AMBULATORY_CARE_PROVIDER_SITE_OTHER): Payer: Medicare Other | Admitting: *Deleted

## 2013-04-24 DIAGNOSIS — I4891 Unspecified atrial fibrillation: Secondary | ICD-10-CM

## 2013-04-24 DIAGNOSIS — I5022 Chronic systolic (congestive) heart failure: Secondary | ICD-10-CM

## 2013-04-24 DIAGNOSIS — Z7901 Long term (current) use of anticoagulants: Secondary | ICD-10-CM

## 2013-04-24 LAB — BASIC METABOLIC PANEL
Calcium: 9 mg/dL (ref 8.4–10.5)
Chloride: 104 mEq/L (ref 96–112)
Creatinine, Ser: 1.1 mg/dL (ref 0.4–1.5)
Sodium: 138 mEq/L (ref 135–145)

## 2013-04-24 LAB — POCT INR: INR: 1.5

## 2013-04-28 ENCOUNTER — Other Ambulatory Visit: Payer: Self-pay | Admitting: *Deleted

## 2013-05-08 ENCOUNTER — Ambulatory Visit (INDEPENDENT_AMBULATORY_CARE_PROVIDER_SITE_OTHER): Payer: Medicare Other | Admitting: *Deleted

## 2013-05-08 DIAGNOSIS — I4891 Unspecified atrial fibrillation: Secondary | ICD-10-CM

## 2013-05-08 DIAGNOSIS — Z7901 Long term (current) use of anticoagulants: Secondary | ICD-10-CM

## 2013-05-08 LAB — POCT INR: INR: 1.7

## 2013-05-21 ENCOUNTER — Telehealth: Payer: Self-pay | Admitting: *Deleted

## 2013-05-21 NOTE — Telephone Encounter (Signed)
Okay x1. My concerns about  This medication have been expressed frankly to him in the past (see chart entries). He becomes extremely emotional when I mentioned weaning or decreasing dose of this medication.

## 2013-05-21 NOTE — Telephone Encounter (Signed)
Patients caregiver left message on triage line requesting refill on Klonopin.  Last OV 7/37/14 Last refill 04/09/13 #90 Contract on file, UDS results placed on ledge for your review Okay to refill?

## 2013-05-22 ENCOUNTER — Ambulatory Visit (INDEPENDENT_AMBULATORY_CARE_PROVIDER_SITE_OTHER): Payer: Medicare Other | Admitting: Cardiology

## 2013-05-22 ENCOUNTER — Encounter: Payer: Self-pay | Admitting: Cardiology

## 2013-05-22 ENCOUNTER — Other Ambulatory Visit (HOSPITAL_COMMUNITY): Payer: Self-pay | Admitting: Cardiology

## 2013-05-22 ENCOUNTER — Ambulatory Visit (HOSPITAL_COMMUNITY): Payer: Medicare Other | Attending: Cardiology | Admitting: Radiology

## 2013-05-22 ENCOUNTER — Ambulatory Visit (INDEPENDENT_AMBULATORY_CARE_PROVIDER_SITE_OTHER): Payer: Medicare Other | Admitting: *Deleted

## 2013-05-22 ENCOUNTER — Other Ambulatory Visit: Payer: Self-pay | Admitting: *Deleted

## 2013-05-22 VITALS — BP 110/53 | HR 44 | Ht 72.0 in | Wt 184.0 lb

## 2013-05-22 DIAGNOSIS — I5022 Chronic systolic (congestive) heart failure: Secondary | ICD-10-CM

## 2013-05-22 DIAGNOSIS — Z7901 Long term (current) use of anticoagulants: Secondary | ICD-10-CM

## 2013-05-22 DIAGNOSIS — R42 Dizziness and giddiness: Secondary | ICD-10-CM | POA: Insufficient documentation

## 2013-05-22 DIAGNOSIS — I4891 Unspecified atrial fibrillation: Secondary | ICD-10-CM | POA: Insufficient documentation

## 2013-05-22 DIAGNOSIS — I509 Heart failure, unspecified: Secondary | ICD-10-CM

## 2013-05-22 DIAGNOSIS — F411 Generalized anxiety disorder: Secondary | ICD-10-CM

## 2013-05-22 DIAGNOSIS — I2510 Atherosclerotic heart disease of native coronary artery without angina pectoris: Secondary | ICD-10-CM | POA: Insufficient documentation

## 2013-05-22 DIAGNOSIS — R5381 Other malaise: Secondary | ICD-10-CM | POA: Insufficient documentation

## 2013-05-22 DIAGNOSIS — Z87891 Personal history of nicotine dependence: Secondary | ICD-10-CM | POA: Insufficient documentation

## 2013-05-22 DIAGNOSIS — R55 Syncope and collapse: Secondary | ICD-10-CM | POA: Insufficient documentation

## 2013-05-22 DIAGNOSIS — E785 Hyperlipidemia, unspecified: Secondary | ICD-10-CM | POA: Insufficient documentation

## 2013-05-22 DIAGNOSIS — I5023 Acute on chronic systolic (congestive) heart failure: Secondary | ICD-10-CM

## 2013-05-22 DIAGNOSIS — I10 Essential (primary) hypertension: Secondary | ICD-10-CM | POA: Insufficient documentation

## 2013-05-22 MED ORDER — LISINOPRIL 5 MG PO TABS: 5.0000 mg | ORAL_TABLET | Freq: Every day | ORAL | Status: DC

## 2013-05-22 MED ORDER — CLONAZEPAM 0.5 MG PO TABS: 0.5000 mg | ORAL_TABLET | Freq: Two times a day (BID) | ORAL | Status: DC | PRN

## 2013-05-22 NOTE — Telephone Encounter (Signed)
Rf for klonopin printed and placed on ledge for signature

## 2013-05-22 NOTE — Progress Notes (Signed)
Patient ID: [name], male   DOB: 02/01/1925, 79 y.o.   MRN: [id number] PCP: Dr. Alwyn Ren  79 yo presents for cardiology followup after admission in the Spring to [facility] with atrial fibrillation/RVR and acute systolic CHF.  He was admitted in 4/14 with atrial fibrillation/RVR and volume overloaded.  Echo showed EF 25-30%.  He had a LHC showed 60% pLAD stenosis that was thought to not be hemodynamically significant.  TEE showed possible LAA thrombus, so DCCV was not done initially.  He was started on amiodarone and Toprol XL for HR control with plan to anticoagulate for 4 weeks followed by DCCV.  While in the hospital, he had AKI due to postrenal obstruction (BPH) and now has an indwelling foley.  Urology is following.  Creatinine improved back to normal range.  Patient additionally has significant depression.  He was diuresed, rate controlled, anticoagulated, and discharged to [facility] nursing home.  In 6/14, he had TEE-guided DCCV to NSR.  The TEE showed no LAA thrombus and EF remained 25-30%.  He is now home from the nursing home.  He remains in NSR today. Toprol XL dose has been decreased due to bradycardia in the 40s.   [name] is symptomatically stable.  He still has the foley in.   He has 24 hour care at home.  He is walking with a walker around the house, not really getting dyspneic in the house.  No chest pain.  No PND but does sleep with 3 pillows.  No lightheadedness, syncope, or falls.  No tachypalpitations.  The right foot ulcer is healing (follows with podiatry).  He had an echo done today to reassess EF.  This looks improved, EF around 40%.   Labs (4/14): AST 82, ALT 89 Labs (5/14): K 4.5, creatinine 1.2, HCT 38.4, LFTs normal, TSH normal Labs (6/14): K 4.1, creatinine 0.96 Labs (8/14): LDL 24, HDL 43 Labs (9/14): K 3.9, creatinine 1.1  ECG: NSR, LVH, poor anterior R wave progression  Past Medical History:  1. Anemia 2. Colonic polyps  3. Hyperlipidemia  4. Hypertension  5.  Skin cancer, hx of, Basal cell 2010, 1989  6. Right TKR  7. BPH  8. Panic attacks/generalized anxiety/depression 9. Presyncope in 2011: Echo (8/11) with EF 60%, normal RV size and systolic function, no significant valvular abnormalities. Carotid dopplers (8/11): minimal carotid stenosis. Event monitor (10/11) for 3 wks: episodes of sinus bradycardia with heart rate to low 40s occasionally at night (suspect while sleeping). No other significant events.  10. Atrial fibrillation: First noted in 4/14.  Possible LAA thrombus on TEE in 4/14.  TEE (6/14) with no LAA thrombus.  DCCV to NSR in 6/14. Patient on amiodarone.  11. Systolic CHF: Nonischemic cardiomyopathy.  Echo (4/14) with EF 25-30%, diffuse hypokinesis with basal inferior and inferoseptal akinesis, moderate-severe William, moderately dilated RV with moderately decreased systolic function, PA systolic pressure 43 mmHg.  LHC (4/14) with 60% pLAD stenosis thought not to be hemodynamically significant.  Possible tachycardia-mediated cardiomyopathy.  TEE (6/14) with EF 25-30%, global hypokinesis, mild William.  12. CAD: LHC (4/14) with 60% pLAD stenosis.  13. Elevated LFTs 14. Mitral regurgitation: Moderate to severe on echo in 4/14 but followup TEE showed only mild-moderate William.  TEE (6/14) with mild William.  15. Post-renal AKI: Has indwelling foley now for BPH.   Family History:  Father: MI @ 19, CHF S/P TURP @ 57  Mother: d 58  Siblings: bro d 29 CVAs; bro d 65 CAD; bro  d 21 lung CA   Social History:  Retired  Married  Former Smoker:quit 1985   ROS: All systems reviewed and negative except as per HPI.   Current Outpatient Prescriptions  Medication Sig Dispense Refill  . acetaminophen (RA ARTHRITIS PAIN RELIEF) 650 MG CR tablet Take 1 tablet (650 mg total) by mouth every 6 (six) hours as needed for pain.      [name] amiodarone (PACERONE) 200 MG tablet Take 0.5 tablets (100 mg total) by mouth daily.  30 tablet  1  . buPROPion (WELLBUTRIN) 75 MG tablet TAKE  1 TABLET TWICE A DAY  60 tablet  4  . clonazePAM (KLONOPIN) 0.5 MG tablet Take 1-2 tablets (0.5-1 mg total) by mouth 2 (two) times daily as needed for anxiety. Take 1 tablet in the mornings Take 2 tablets at bedtime  90 tablet  0  . docusate sodium (COLACE) 100 MG capsule Take 100 mg by mouth 2 (two) times daily.      . finasteride (PROSCAR) 5 MG tablet Take 1 tablet (5 mg total) by mouth daily.  30 tablet  0  . furosemide (LASIX) 20 MG tablet Take 40 mg by mouth daily.      [name] L-Methylfolate-B12-B6-B2 (L-METHYL-MC PO) Take 1 tablet by mouth 2 (two) times daily.       . Multiple Vitamin (MULTIVITAMIN) tablet Take 1 tablet by mouth daily.      . NON FORMULARY Phenazotyridine      . pravastatin (PRAVACHOL) 80 MG tablet Take 80 mg by mouth every evening. Night time      . Probiotic Product (ALIGN) 4 MG CAPS Take 1 capsule by mouth.      . warfarin (COUMADIN) 2.5 MG tablet Take 1.25-2.5 mg by mouth daily. Take 1 tablet Sunday,Tuesday,Thursday, & Saturday Take 0.5 tablet on all other days      . lisinopril (PRINIVIL,ZESTRIL) 5 MG tablet Take 1 tablet (5 mg total) by mouth daily.  30 tablet  4   No current facility-administered medications for this visit.    BP 110/53  Pulse 44  Ht 6' (1.829 m)  Wt 83.462 kg (184 lb)  BMI 24.95 kg/m2 General: NAD, chronically ill appearing Neck: JVP 7 cm, no thyromegaly or thyroid nodule.  Lungs: Clear to auscultation bilaterally with normal respiratory effort. CV: Nondisplaced PMI.  Heart regular S1/S2, no S3/S4, 2/6 SEM.  Trace ankle edema bilaterally.  No carotid bruit.  Abdomen: Soft, nontender, no hepatosplenomegaly, no distention.  Neurologic: Alert and oriented x 3.  Psych: Normal affect. Extremities: No clubbing or cyanosis. Right foot ulcer healing.   Assessment/Plan: 1. Chronic systolic CHF: EF 72%, somewhat improved.  Possible tachycardia-mediated CMP.  LHC showed 60% pLAD but suspect not hemodynamically signficant. He looks euvolemic on exam.   - He continues to be bradycardic with HR in 40s.  I will stop Toprol XL.  - Continue current Lasix. - Increase lisinopril to 5 mg daily.   - BMET in 2 wks.  2. Atrial fibrillation: In NSR today. He remains on amiodarone at 100 mg daily. Stopping Toprol XL today due to bradycardia.  - Check LFTs/TSH with labs in 2 weeks.  - Needs yearly eye exam while on amiodarone.    3. CAD: 60% pLAD stenosis on cath, doubt hemodynamic significance.  Continue coumadin, atorvastatin, Toprol XL.   4. CKD: Creatinine stable.  5. Hyperlipidemia: Continue statin.  6. I am going to try to get home PT started again.   [name] 05/22/2013

## 2013-05-22 NOTE — Progress Notes (Signed)
Limited Echocardiogram performed.  

## 2013-05-22 NOTE — Patient Instructions (Addendum)
Stop metoprolol (Toprol).   Increase lisinopril to 5mg  daily.   Your physician recommends that you return for lab work in: 2 weeks--Liver profile/TSH/BMET.  You have been referred to [facility] for home physical therapy. Garner Gavel works for [facility] now and they do not accept your insurance.   Your physician recommends that you schedule a follow-up appointment in: 3 months with Dr Shirlee Latch.

## 2013-05-26 ENCOUNTER — Telehealth: Payer: Self-pay | Admitting: *Deleted

## 2013-05-26 NOTE — Telephone Encounter (Signed)
H[facility] Newcomer ','<<< Less Detail       [name]     MRN: [id number] DOB: [birth year]     Pt Home: [phone]     Sent: Caleen Essex May 23, 2013 9:15 AM     To: Jacqlyn Krauss, RN                          Message    Elmon Else. Unfortunately, in this patient's area, I can only accept home health referrals for patients with straight Medicare and some Medicare replacement plans. This pt has a [facility] plan so I won't be able to staff this one. We hope to be back open to all payors soon.    Please let me know if you have any questions.    Thank you   ----- Message -----   From: Jacqlyn Krauss, RN   Sent: 05/22/2013 4:03 PM   To: [place] Newcomer      I placed an order in Epic for home PT for him. Thanks Cyndy Freeze, Melissa ','<<< Less Detail       [name]     MRN: [id number] DOB: 12-09-1924     Pt Home: [phone]     Sent: Caleen Essex May 23, 2013 9:15 AM     To: Jacqlyn Krauss, RN                          Message    Elmon Else. Unfortunately, in this patient's area, I can only accept home health referrals for patients with straight Medicare and some Medicare replacement plans. This pt has a [facility] plan so I won't be able to staff this one. We hope to be back open to all payors soon.    Please let me know if you have any questions.    Thank you   ----- Message -----   From: Jacqlyn Krauss, RN   Sent: 05/22/2013 4:03 PM   To: [place] Newcomer      I placed an order in Epic for home PT for him. Thanks Sherene Sires ','<<< Less Detail       [name]     MRN: [id number] DOB: Oct 14, 1924     Pt Home: [phone]                                      Message    Elmon Else. Unfortunately, in this patient's area, I can only accept home health referrals for patients with straight Medicare and some Medicare replacement plans. This pt has a [facility] plan so I won't be able to staff this one. We  hope to be back open to all payors soon.    Please let me know if you have any questions.    Thank you   ----- Message -----   From: Jacqlyn Krauss, RN   Sent: 05/22/2013 4:03 PM   To: [place] Newcomer      I placed an order in Epic for home PT for him. Thanks Thurston Hole   05/26/13 I spoke Willaim Sheng at [facility] -she will let me know if they are able to provide home PT services for patient. Ph [phone], Fax [phone]. Jodene Nam, pt's home assistant  is aware I am making referral to [facility] for home PT services.

## 2013-05-28 NOTE — Telephone Encounter (Signed)
Spoke with Drinda Butts at [facility].

## 2013-05-28 NOTE — Telephone Encounter (Signed)
Drinda Butts states they are unable to provide just  home PT services, a comprehensive  home health case would need to be opened for this patient. I spoke with Elmarie Shiley, pt's private 24 hour home care assistant. She states patient does not need other home health services, just PT. She will check on some other medical resources she knows to see if they are aware of other agencies that may be able to provide just PT services in the home.

## 2013-06-05 ENCOUNTER — Ambulatory Visit (INDEPENDENT_AMBULATORY_CARE_PROVIDER_SITE_OTHER): Payer: Medicare Other | Admitting: *Deleted

## 2013-06-05 DIAGNOSIS — Z7901 Long term (current) use of anticoagulants: Secondary | ICD-10-CM

## 2013-06-05 DIAGNOSIS — I4891 Unspecified atrial fibrillation: Secondary | ICD-10-CM

## 2013-06-05 LAB — POCT INR: INR: 1.6

## 2013-06-10 ENCOUNTER — Other Ambulatory Visit: Payer: Self-pay | Admitting: Cardiology

## 2013-06-16 ENCOUNTER — Ambulatory Visit (INDEPENDENT_AMBULATORY_CARE_PROVIDER_SITE_OTHER): Payer: Medicare Other | Admitting: *Deleted

## 2013-06-16 DIAGNOSIS — I4891 Unspecified atrial fibrillation: Secondary | ICD-10-CM

## 2013-06-16 DIAGNOSIS — Z7901 Long term (current) use of anticoagulants: Secondary | ICD-10-CM

## 2013-06-17 ENCOUNTER — Other Ambulatory Visit: Payer: Self-pay | Admitting: Cardiology

## 2013-06-23 ENCOUNTER — Other Ambulatory Visit: Payer: Self-pay | Admitting: Internal Medicine

## 2013-06-24 ENCOUNTER — Other Ambulatory Visit: Payer: Self-pay | Admitting: *Deleted

## 2013-06-24 DIAGNOSIS — F411 Generalized anxiety disorder: Secondary | ICD-10-CM

## 2013-06-24 MED ORDER — CLONAZEPAM 0.5 MG PO TABS: ORAL_TABLET | ORAL | Status: DC

## 2013-06-24 NOTE — Telephone Encounter (Signed)
clonazePAM (KLONOPIN) 0.5 MG tablet Last refill: 05/22/2013 #90 Last OV: 03/20/2013 No UDS on file; contract on file

## 2013-06-24 NOTE — Telephone Encounter (Signed)
OK but 3 / day max

## 2013-06-24 NOTE — Telephone Encounter (Signed)
Clonazepam refilled. Pt notified

## 2013-06-24 NOTE — Telephone Encounter (Signed)
Done

## 2013-06-25 ENCOUNTER — Other Ambulatory Visit: Payer: Self-pay | Admitting: Internal Medicine

## 2013-06-26 ENCOUNTER — Other Ambulatory Visit: Payer: Self-pay

## 2013-07-03 ENCOUNTER — Ambulatory Visit (INDEPENDENT_AMBULATORY_CARE_PROVIDER_SITE_OTHER): Payer: Medicare Other | Admitting: *Deleted

## 2013-07-03 DIAGNOSIS — I4891 Unspecified atrial fibrillation: Secondary | ICD-10-CM

## 2013-07-03 DIAGNOSIS — Z7901 Long term (current) use of anticoagulants: Secondary | ICD-10-CM

## 2013-07-03 LAB — POCT INR: INR: 2.3

## 2013-07-10 ENCOUNTER — Other Ambulatory Visit: Payer: Self-pay | Admitting: Podiatrist

## 2013-07-14 ENCOUNTER — Other Ambulatory Visit: Payer: Self-pay | Admitting: Cardiology

## 2013-07-14 ENCOUNTER — Other Ambulatory Visit: Payer: Self-pay | Admitting: Podiatrist

## 2013-07-14 MED ORDER — L-METHYLFOLATE-B6-B12 3-35-2 MG PO TABS: 1.0000 | ORAL_TABLET | Freq: Two times a day (BID) | ORAL | Status: DC

## 2013-07-21 ENCOUNTER — Other Ambulatory Visit: Payer: Self-pay | Admitting: Internal Medicine

## 2013-07-22 NOTE — Telephone Encounter (Signed)
clonazePAM (KLONOPIN) 0.5 MG tablet Take one tablet by mouth in the morning and two tablets by mouth before bedtime as needed Last refill: 06/24/2013 #90, 0 refills Last OV: 03/20/2013 Needs UDS

## 2013-07-22 NOTE — Telephone Encounter (Signed)
OK 

## 2013-07-23 ENCOUNTER — Encounter: Payer: Self-pay | Admitting: Internal Medicine

## 2013-07-24 ENCOUNTER — Ambulatory Visit (INDEPENDENT_AMBULATORY_CARE_PROVIDER_SITE_OTHER): Payer: Medicare Other | Admitting: *Deleted

## 2013-07-24 DIAGNOSIS — Z7901 Long term (current) use of anticoagulants: Secondary | ICD-10-CM

## 2013-07-24 DIAGNOSIS — I4891 Unspecified atrial fibrillation: Secondary | ICD-10-CM

## 2013-07-24 LAB — POCT INR: INR: 2.1

## 2013-08-05 ENCOUNTER — Other Ambulatory Visit: Payer: Self-pay | Admitting: Cardiology

## 2013-08-07 ENCOUNTER — Ambulatory Visit (INDEPENDENT_AMBULATORY_CARE_PROVIDER_SITE_OTHER): Payer: Medicare Other | Admitting: Podiatrist

## 2013-08-07 ENCOUNTER — Encounter: Payer: Self-pay | Admitting: Podiatrist

## 2013-08-07 VITALS — BP 138/70 | HR 65 | Resp 12

## 2013-08-07 DIAGNOSIS — Q828 Other specified congenital malformations of skin: Secondary | ICD-10-CM

## 2013-08-07 DIAGNOSIS — B351 Tinea unguium: Secondary | ICD-10-CM

## 2013-08-07 DIAGNOSIS — I739 Peripheral vascular disease, unspecified: Secondary | ICD-10-CM

## 2013-08-07 DIAGNOSIS — M79609 Pain in unspecified limb: Secondary | ICD-10-CM

## 2013-08-07 NOTE — Progress Notes (Signed)
  Chief Complaint  Patient presents with  . Callouses    callus trim''  . Nail Problem    toenails trim.     HPI: Patient is 79 y.o. male who presents today for continued foot care bilateral. Patient had an ulceration submetatarsal 5 of the right foot which has gone on to heal with the use of an offloading shoe and wound care. He also had a sore on his heel which she also states feels better too. Patient's toenails are elongated and symptomatic and uncomfortable with ambulation and shoe gear. He comes in today with his daughter and caregiver    Physical Exam  GENERAL APPEARANCE: Alert, conversant. Appropriately groomed. Seen today in his wheelchair.  VASCULAR: Pedal pulses palpable decreased and faint bilateral.  Capillary refill time increased to all digits,  Proximal to distal cooling it warm to warm. Multiple varicosities are noted bilateral. NEUROLOGIC: sensation is intact epicritically and protectively to 5.07 monofilament at 3/5 sites bilateral.  Light touch is intact bilateral,  MUSCULOSKELETAL: Significant deformity present. Large exostoses present fifth metatarsals bilaterally. These are sites for ulceration and breakdown.  DERMATOLOGIC: Keratotic lesions present bilateral fifth metatarsals. Healed ulceration right fifth metatarsal is seen. Patient's toenails are elongated, thickened, dystrophic, symptomatic and mycotic.   Assessment: Pre-ulcerative lesions bilateral feet fifth metatarsals at the bony prominences, symptomatic onychomycosis  Plan: Debrided the pre-ulcerative lesions down to healthy intact skin. Debrided the toenails without complication as well. He will be seen back for routine care and prevention of ulceration. He may stay in his Darco shoe as it helps with both the offloading of the bony prominence as well as the right heel to feel better.  Amond Speranza P

## 2013-08-16 ENCOUNTER — Other Ambulatory Visit: Payer: Self-pay | Admitting: Internal Medicine

## 2013-08-18 NOTE — Telephone Encounter (Signed)
clonazePAM (KLONOPIN) 0.5 MG tablet  Last OV: 03/20/2013 Last refill: 07/21/2013 #90, 0 refills UDS up-to-date, low risk

## 2013-08-18 NOTE — Telephone Encounter (Signed)
OK X1 

## 2013-08-18 NOTE — Telephone Encounter (Signed)
Clonazepam script faxed to patients pharmacy. JG//CMA 

## 2013-08-25 ENCOUNTER — Other Ambulatory Visit: Payer: Self-pay | Admitting: Cardiology

## 2013-08-26 ENCOUNTER — Ambulatory Visit (INDEPENDENT_AMBULATORY_CARE_PROVIDER_SITE_OTHER): Payer: Medicare Other | Admitting: *Deleted

## 2013-08-26 DIAGNOSIS — Z7901 Long term (current) use of anticoagulants: Secondary | ICD-10-CM

## 2013-08-26 DIAGNOSIS — I4891 Unspecified atrial fibrillation: Secondary | ICD-10-CM

## 2013-08-26 LAB — POCT INR: INR: 2.1

## 2013-09-07 ENCOUNTER — Other Ambulatory Visit: Payer: Self-pay | Admitting: Internal Medicine

## 2013-09-08 NOTE — Telephone Encounter (Signed)
Bupropion refilled per protocol. JG//CMA 

## 2013-09-09 ENCOUNTER — Other Ambulatory Visit: Payer: Self-pay | Admitting: Cardiology

## 2013-09-11 ENCOUNTER — Encounter: Payer: Self-pay | Admitting: Internal Medicine

## 2013-09-17 ENCOUNTER — Other Ambulatory Visit: Payer: Self-pay | Admitting: Internal Medicine

## 2013-09-18 NOTE — Telephone Encounter (Signed)
Ok X1 

## 2013-09-18 NOTE — Telephone Encounter (Signed)
Requesting Klonopin 0.5mg   Last refill:08-18-13:#90 Last OV:03-20-13 UDS:Low Risk Please advise.//AB/CMA

## 2013-09-19 NOTE — Telephone Encounter (Signed)
Rx printed and faxed to the pharmacy.//AB/CMA 

## 2013-09-30 ENCOUNTER — Encounter (HOSPITAL_COMMUNITY): Payer: Self-pay | Admitting: Emergency Medicine

## 2013-09-30 ENCOUNTER — Emergency Department (HOSPITAL_COMMUNITY)
Admission: EM | Admit: 2013-09-30 | Discharge: 2013-09-30 | Disposition: A | Discharge status: Home / Self Care | Payer: Medicare Other | Attending: Emergency Medicine | Admitting: Emergency Medicine

## 2013-09-30 DIAGNOSIS — Z85828 Personal history of other malignant neoplasm of skin: Secondary | ICD-10-CM | POA: Insufficient documentation

## 2013-09-30 DIAGNOSIS — Z860100 Personal history of colon polyps, unspecified: Secondary | ICD-10-CM | POA: Insufficient documentation

## 2013-09-30 DIAGNOSIS — Z79899 Other long term (current) drug therapy: Secondary | ICD-10-CM | POA: Insufficient documentation

## 2013-09-30 DIAGNOSIS — I5020 Unspecified systolic (congestive) heart failure: Secondary | ICD-10-CM | POA: Insufficient documentation

## 2013-09-30 DIAGNOSIS — T83091A Other mechanical complication of indwelling urethral catheter, initial encounter: Secondary | ICD-10-CM | POA: Insufficient documentation

## 2013-09-30 DIAGNOSIS — E785 Hyperlipidemia, unspecified: Secondary | ICD-10-CM | POA: Insufficient documentation

## 2013-09-30 DIAGNOSIS — Z87891 Personal history of nicotine dependence: Secondary | ICD-10-CM | POA: Insufficient documentation

## 2013-09-30 DIAGNOSIS — Z7901 Long term (current) use of anticoagulants: Secondary | ICD-10-CM | POA: Insufficient documentation

## 2013-09-30 DIAGNOSIS — N390 Urinary tract infection, site not specified: Secondary | ICD-10-CM | POA: Insufficient documentation

## 2013-09-30 DIAGNOSIS — I4891 Unspecified atrial fibrillation: Secondary | ICD-10-CM | POA: Insufficient documentation

## 2013-09-30 DIAGNOSIS — Z862 Personal history of diseases of the blood and blood-forming organs and certain disorders involving the immune mechanism: Secondary | ICD-10-CM | POA: Insufficient documentation

## 2013-09-30 DIAGNOSIS — F410 Panic disorder [episodic paroxysmal anxiety] without agoraphobia: Secondary | ICD-10-CM | POA: Insufficient documentation

## 2013-09-30 DIAGNOSIS — I10 Essential (primary) hypertension: Secondary | ICD-10-CM | POA: Insufficient documentation

## 2013-09-30 DIAGNOSIS — N400 Enlarged prostate without lower urinary tract symptoms: Secondary | ICD-10-CM | POA: Insufficient documentation

## 2013-09-30 DIAGNOSIS — Y838 Other surgical procedures as the cause of abnormal reaction of the patient, or of later complication, without mention of misadventure at the time of the procedure: Secondary | ICD-10-CM | POA: Insufficient documentation

## 2013-09-30 DIAGNOSIS — I2510 Atherosclerotic heart disease of native coronary artery without angina pectoris: Secondary | ICD-10-CM | POA: Insufficient documentation

## 2013-09-30 LAB — URINALYSIS, ROUTINE W REFLEX MICROSCOPIC
Bilirubin Urine: NEGATIVE
Glucose, UA: NEGATIVE mg/dL
Ketones, ur: NEGATIVE mg/dL
Nitrite: POSITIVE — AB
Protein, ur: NEGATIVE mg/dL
SPECIFIC GRAVITY, URINE: 1.009 (ref 1.005–1.030)
UROBILINOGEN UA: 0.2 mg/dL (ref 0.0–1.0)
pH: 6 (ref 5.0–8.0)

## 2013-09-30 LAB — BASIC METABOLIC PANEL
BUN: 27 mg/dL — ABNORMAL HIGH (ref 6–23)
CHLORIDE: 101 meq/L (ref 96–112)
CO2: 24 mEq/L (ref 19–32)
Calcium: 9.1 mg/dL (ref 8.4–10.5)
Creatinine, Ser: 1.23 mg/dL (ref 0.50–1.35)
GFR calc Af Amer: 59 mL/min — ABNORMAL LOW (ref >90)
GFR, EST NON AFRICAN AMERICAN: 51 mL/min — AB (ref >90)
GLUCOSE: 104 mg/dL — AB (ref 70–99)
POTASSIUM: 4.2 meq/L (ref 3.7–5.3)
SODIUM: 137 meq/L (ref 137–147)

## 2013-09-30 LAB — URINE MICROSCOPIC-ADD ON

## 2013-09-30 MED ORDER — LIDOCAINE HCL 2 % EX GEL
Freq: Once | CUTANEOUS | Status: AC
  Administered 2013-09-30: 10 via TOPICAL
  Filled 2013-09-30: qty 10

## 2013-09-30 MED ORDER — CEPHALEXIN 500 MG PO CAPS: 500.0000 mg | ORAL_CAPSULE | Freq: Four times a day (QID) | ORAL | Status: DC

## 2013-09-30 NOTE — ED Provider Notes (Signed)
CSN: [id number]     Arrival date & time 09/30/13  1253 History   First MD Initiated Contact with Patient 09/30/13 1311     Chief Complaint  Patient presents with  . foley blockage      (Consider location/radiation/quality/duration/timing/severity/associated sxs/prior Treatment) The history is provided by the patient and a caregiver.   Patient here with trouble irrigating the Foley x24 hours. He complains of suprapubic pressure without fever chills or vomiting. Has a history of a chronic indwelling Foley catheter. Color the urine has been normal. Foley was last inserted 2 weeks ago. It didn't irrigate the Foley again this morning was unsuccessful and patient came here. Symptoms have been persistent. Past Medical History  Diagnosis Date  . Anemia     nos  . History of colonic polyps   . Hyperlipidemia   . Hypertension   . Cancer [phone]    hx of basal cell  . BPH (benign prostatic hyperplasia)   . Anxiety attack     panic   . Pre-syncope     echo 03/2010,with EF 60%,mild RV dilation,no significant abnormalities...event monitor for 3wks/episodes os sinus bradycardia with heart rate to low 40's occasionally at night(suspect while sleeping)  . Atrial fibrillation     a. Dx 11/2012 - TEE with possible LAA thrombus, started on amio/coumadin.  . Systolic CHF     a. EF 80-99% 11/2012 ?tachy-mediated. b. Not on ACEI at dc due to AKI, can consider as OP.  [name] CAD (coronary artery disease)     a. Mod CAD by cath 12/10/12 - 60% long proximal LAD, 60% prox PDA.  [name] AKI (acute kidney injury)     a. Peak Cr 4.17 11/2012 - felt to be post-obstructive.  . Urinary retention     a. Post obstructive, dc'd with foley 12/2012.  [name] Renal cyst     a. By renal US 11/2012, not candidate for further eval.  . Thrombus of left atrial appendage     a. Possible LAA thrombus by TEE 11/2012.   Past Surgical History  Procedure Laterality Date  . Colonoscopy w/ polypectomy  2007  . Cholecystectomy    . Total knee  arthroplasty    . Mohs surgery  2010  . Tee without cardioversion N/A 12/11/2012    Procedure: TRANSESOPHAGEAL ECHOCARDIOGRAM (TEE);  Surgeon: Thayer Headings, MD;  Location: [place];  Service: Cardiovascular;  Laterality: N/A;  ja/bev.  Darden Dates without cardioversion N/A 01/21/2013    Procedure: TRANSESOPHAGEAL ECHOCARDIOGRAM (TEE);  Surgeon: Larey Dresser, MD;  Location: [place];  Service: Cardiovascular;  Laterality: N/A;  . Cardioversion N/A 01/21/2013    Procedure: CARDIOVERSION;  Surgeon: Larey Dresser, MD;  Location[facility] END[facility]COPY;  Service: Cardiovascular;  Laterality: N/A;   Family History  Problem Relation Age of Onset  . Heart attack Father   . Heart failure Father 65    S/P Turp  . Heart disease Father 58    MI.Marland KitchenMarland KitchenCHF S/P TURP @ 74  . Stroke Brother 82  . Coronary artery disease Brother   . Lung cancer Brother    History  Substance Use Topics  . Smoking status: Former Smoker    Quit date: 08/22/1983  . Smokeless tobacco: Not on file  . Alcohol Use: No    Review of Systems  All other systems reviewed and are negative.      Allergies  Darifenacin hydrobromide and Fluoxetine hcl  Home Medications   Current Outpatient Rx  Name  Route  Sig  Dispense  Refill  . acetaminophen (TYLENOL) 325 MG tablet   Oral   Take 650 mg by mouth every 6 (six) hours as needed for moderate pain.         [name] amiodarone (PACERONE) 200 MG tablet   Oral   Take 100 mg by mouth daily.         [name] buPROPion (WELLBUTRIN) 75 MG tablet   Oral   Take 75 mg by mouth 2 (two) times daily.         . clonazePAM (KLONOPIN) 0.5 MG tablet   Oral   Take 0.5-1 mg by mouth 2 (two) times daily as needed for anxiety (1 in the morning, 2 in the evening as needed).         . docusate sodium (COLACE) 100 MG capsule   Oral   Take 100 mg by mouth 2 (two) times daily.         . finasteride (PROSCAR) 5 MG tablet   Oral   Take 1 tablet (5 mg total) by mouth daily.   30 tablet   0      Follow up with urologist for refills on this medic ...   . furosemide (LASIX) 40 MG tablet   Oral   Take 40 mg by mouth daily.         [name] L-Methylfolate-B12-B6-B2 (L-METHYL-MC PO)   Oral   Take 1 tablet by mouth 2 (two) times daily.          [name] l-methylfolate-B6-B12 (METANX) 3-35-2 MG TABS   Oral   Take 1 tablet by mouth 2 (two) times daily.   60 tablet   4   . lisinopril (PRINIVIL,ZESTRIL) 5 MG tablet   Oral   Take 1 tablet (5 mg total) by mouth daily.   30 tablet   4   . Meth-Hyo-M Bl-Na Phos-Ph Sal (URIBEL PO)   Oral   Take 1 capsule by mouth 4 (four) times daily as needed (bladder spasm).         . Multiple Vitamin (MULTIVITAMIN) tablet   Oral   Take 1 tablet by mouth daily.         . phenazopyridine (PYRIDIUM) 100 MG tablet   Oral   Take 100 mg by mouth 3 (three) times daily as needed for pain.         . pravastatin (PRAVACHOL) 80 MG tablet   Oral   Take 80 mg by mouth every evening. Night time         . Probiotic Product (ALIGN) 4 MG CAPS   Oral   Take 1 capsule by mouth.         . warfarin (COUMADIN) 2.5 MG tablet   Oral   Take 2.5-3.75 mg by mouth daily. 2.5mg  on M, T, W, F, Sat. 3.75mg  on Sun, Thurs.          BP 117/65  Pulse 63  Temp(Src) 97.6 F (36.4 C) (Oral)  Resp 16  SpO2 96% Physical Exam  Nursing note and vitals reviewed. Constitutional: He is oriented to person, place, and time. He appears well-developed and well-nourished.  Non-toxic appearance. No distress.  HENT:  Head: Normocephalic and atraumatic.  Eyes: Conjunctivae, EOM and lids are normal. Pupils are equal, round, and reactive to light.  Neck: Normal range of motion. Neck supple. No tracheal deviation present. No mass present.  Cardiovascular: Normal rate, regular rhythm and normal heart sounds.  Exam reveals no gallop.   No murmur heard. Pulmonary/Chest:  Effort normal and breath sounds normal. No stridor. No respiratory distress. He has no decreased breath sounds.  He has no wheezes. He has no rhonchi. He has no rales.  Abdominal: Soft. Normal appearance and bowel sounds are normal. He exhibits no distension. There is tenderness in the suprapubic area. There is no rigidity, no rebound, no guarding and no CVA tenderness.    Genitourinary: No phimosis or paraphimosis. No discharge found.  Musculoskeletal: Normal range of motion. He exhibits no edema and no tenderness.  Neurological: He is alert and oriented to person, place, and time. He has normal strength. No cranial nerve deficit or sensory deficit. GCS eye subscore is 4. GCS verbal subscore is 5. GCS motor subscore is 6.  Skin: Skin is warm and dry. No abrasion and no rash noted.  Psychiatric: He has a normal mood and affect. His speech is normal and behavior is normal.    ED Course  Procedures (including critical care time) Labs Review Labs Reviewed  URINE CULTURE  BASIC METABOLIC PANEL  URINALYSIS, ROUTINE W REFLEX MICROSCOPIC   Imaging Review No results found.  EKG Interpretation   None       MDM   Final diagnoses:  None    Patient had Foley catheter changed by nursing and will be treated for UTI    Leota Jacobsen, MD 09/30/13 [phone]

## 2013-09-30 NOTE — ED Notes (Signed)
Bed: WA20 Expected date:  Expected time:  Means of arrival:  Comments: EMS 

## 2013-09-30 NOTE — ED Notes (Signed)
Per EMS pt from home, pt's urinary catheter won't drain since today, pt complaining of bladder discomfort, states he feels like he needs to urinate. Home health nurse tried to irrigate foley.

## 2013-09-30 NOTE — Discharge Instructions (Signed)
Urinary Tract Infection  Urinary tract infections (UTIs) can develop anywhere along your urinary tract. Your urinary tract is your body's drainage system for removing wastes and extra water. Your urinary tract includes two kidneys, two ureters, a bladder, and a urethra. Your kidneys are a pair of bean-shaped organs. Each kidney is about the size of your fist. They are located below your ribs, one on each side of your spine.  CAUSES  Infections are caused by microbes, which are microscopic organisms, including fungi, viruses, and bacteria. These organisms are so small that they can only be seen through a microscope. Bacteria are the microbes that most commonly cause UTIs.  SYMPTOMS   Symptoms of UTIs may vary by age and gender of the patient and by the location of the infection. Symptoms in young women typically include a frequent and intense urge to urinate and a painful, burning feeling in the bladder or urethra during urination. Older women and men are more likely to be tired, shaky, and weak and have muscle aches and abdominal pain. A fever may mean the infection is in your kidneys. Other symptoms of a kidney infection include pain in your back or sides below the ribs, nausea, and vomiting.  DIAGNOSIS  To diagnose a UTI, your caregiver will ask you about your symptoms. Your caregiver also will ask to provide a urine sample. The urine sample will be tested for bacteria and white blood cells. White blood cells are made by your body to help fight infection.  TREATMENT   Typically, UTIs can be treated with medication. Because most UTIs are caused by a bacterial infection, they usually can be treated with the use of antibiotics. The choice of antibiotic and length of treatment depend on your symptoms and the type of bacteria causing your infection.  HOME CARE INSTRUCTIONS   If you were prescribed antibiotics, take them exactly as your caregiver instructs you. Finish the medication even if you feel better after you  have only taken some of the medication.   Drink enough water and fluids to keep your urine clear or pale yellow.   Avoid caffeine, tea, and carbonated beverages. They tend to irritate your bladder.   Empty your bladder often. Avoid holding urine for long periods of time.   Empty your bladder before and after sexual intercourse.   After a bowel movement, women should cleanse from front to back. Use each tissue only once.  SEEK MEDICAL CARE IF:    You have back pain.   You develop a fever.   Your symptoms do not begin to resolve within 3 days.  SEEK IMMEDIATE MEDICAL CARE IF:    You have severe back pain or lower abdominal pain.   You develop chills.   You have nausea or vomiting.   You have continued burning or discomfort with urination.  MAKE SURE YOU:    Understand these instructions.   Will watch your condition.   Will get help right away if you are not doing well or get worse.  Document Released: 05/17/2005 Document Revised: 02/06/2012 Document Reviewed: 09/15/2011  [employer] Patient Information 2014 [employer]

## 2013-09-30 NOTE — ED Notes (Signed)
Pt states has a urinary catheter, gets changed once a month by urologist, states was last changed about 2 weeks ago, woke up last night having a constant ache where catheter is, states took tylenol this morning and it helped a little, states catheter is still draining like normal, denies any injury to catheter.

## 2013-10-02 LAB — URINE CULTURE

## 2013-10-03 ENCOUNTER — Telehealth (HOSPITAL_COMMUNITY): Payer: Self-pay | Admitting: *Deleted

## 2013-10-03 NOTE — Progress Notes (Signed)
ED Antimicrobial Stewardship Positive Culture Follow Up   [name] is an 80 y.o. male who presented to [facility] on 09/30/2013 with a chief complaint of  Chief Complaint  Patient presents with  . foley blockage     Recent Results (from the past 720 hour(s))  URINE CULTURE     Status: None   Collection Time    09/30/13  3:00 PM      Result Value Ref Range Status   Specimen Description URINE, CATHETERIZED   Final   Special Requests NONE   Final   Culture  Setup Time     Final   Value: 09/30/2013 21:45     Performed at [place]     Final   Value: >=100,000 COLONIES/ML     Performed at [facility]   Culture     Final   Value: ENTEROBACTER AEROGENES     Performed at [facility]   Report Status 10/02/2013 FINAL   Final   Organism ID, Bacteria ENTEROBACTER AEROGENES   Final    [x]  Treated with keflex, organism resistant to prescribed antimicrobial  80 yo who was seen for foley blockage. He has a hx urinary retention. His UA looks dirty but he does have foley in. He was sent out on keflex but will not cover enterobacter that has now grown out.  New antibiotic prescription:  Stop keflex Start Septra DS 1 PO BID x 7 days  ED Provider: Hart Robinsons, PA   Onnie Boer, PharmD Pager: [phone] Infectious Diseases Pharmacist Phone# [phone]

## 2013-10-03 NOTE — ED Notes (Signed)
Post ED Visit - Positive Culture Follow-up: Successful Patient Follow-Up  Culture assessed and recommendations reviewed by: []  Wes Carlisle, Pharm.D., BCPS []  Heide Guile, Pharm.D., BCPS []  Alycia Rossetti, Pharm.D., BCPS [x]  [place], Pharm.D., BCPS, AAHIVP []  Legrand Como, Pharm.D., BCPS, AAHIVP  Positive urine culture  []  Patient discharged without antimicrobial prescription and treatment is now indicated [x]  Organism is resistant to prescribed ED discharge antimicrobial []  Patient with positive blood cultures  Changes discussed with ED provider: Lianne Bushy New antibiotic prescription Septra DS 1 po BID x 7 days   Varney Baas 10/03/2013, 4:37 PM

## 2013-10-03 NOTE — ED Notes (Unsigned)
rx called to pharmacy by [facility]

## 2013-10-03 NOTE — ED Notes (Signed)
Patient informed of positive results and requests that rx be called to [facility]

## 2013-10-08 ENCOUNTER — Telehealth: Payer: Self-pay

## 2013-10-08 NOTE — Telephone Encounter (Signed)
Pt's family member called reports pt going to ED 09/30/13 and pt being rx Cephalexin for UTI.  ED called back on 10/03/13 and changed abx to Bactrim DS BID x 7 days.  Advised this abx can increase effects of Coumadin and raise INR, thin the blood.  Denies any signs or symptoms at present of abnormal bleeding.  Pt has 3 more days left of abx.  Pt's normal dosage of Coumadin is 2.5mg  QD/3.75mg  Su,Th.  Advised to have pt take 1.25mg  x 3 days until we can check INR on 10/10/13.  Original call was to cancel appt on 10/10/13 secondary to shakiness and unsteady feet.  Advised family member we need to keep appt if at all possible because of medication interaction.  Pt's family member verbalized understanding will keep appt 10/10/13.  Advised to monitor of any abnormal bleeding and seek immediate medical attention if bleeding occurs.

## 2013-10-10 ENCOUNTER — Ambulatory Visit (INDEPENDENT_AMBULATORY_CARE_PROVIDER_SITE_OTHER): Payer: Medicare Other | Admitting: Pharmacist

## 2013-10-10 DIAGNOSIS — I4891 Unspecified atrial fibrillation: Secondary | ICD-10-CM

## 2013-10-10 DIAGNOSIS — Z7901 Long term (current) use of anticoagulants: Secondary | ICD-10-CM

## 2013-10-10 LAB — POCT INR: INR: 3.1

## 2013-10-13 ENCOUNTER — Other Ambulatory Visit: Payer: Self-pay | Admitting: Internal Medicine

## 2013-10-13 ENCOUNTER — Other Ambulatory Visit: Payer: Self-pay | Admitting: Cardiology

## 2013-10-15 NOTE — Telephone Encounter (Signed)
Requesting Clonazepam 0.5mg  Take 1 tablet by mouth every morning and take 2 tablets at bedtime as needed Last refill:09-30-13:#90 Last OV:03-20-13 UDS:04-09-13-Low risk(due) Please advise.//AB/CMA

## 2013-10-15 NOTE — Telephone Encounter (Signed)
OK X1 

## 2013-10-15 NOTE — Telephone Encounter (Signed)
Rx printed and faxed to the pharmacy.//AB/CMA 

## 2013-10-21 ENCOUNTER — Other Ambulatory Visit: Payer: Self-pay | Admitting: Cardiology

## 2013-10-24 ENCOUNTER — Ambulatory Visit (INDEPENDENT_AMBULATORY_CARE_PROVIDER_SITE_OTHER): Payer: Medicare Other | Admitting: Pharmacist

## 2013-10-24 DIAGNOSIS — Z7901 Long term (current) use of anticoagulants: Secondary | ICD-10-CM

## 2013-10-24 DIAGNOSIS — I4891 Unspecified atrial fibrillation: Secondary | ICD-10-CM

## 2013-10-24 LAB — POCT INR: INR: 2

## 2013-10-30 ENCOUNTER — Ambulatory Visit: Payer: Medicare Other | Admitting: Podiatrist

## 2013-10-31 ENCOUNTER — Ambulatory Visit: Payer: Medicare Other | Admitting: Podiatrist

## 2013-11-05 ENCOUNTER — Emergency Department (HOSPITAL_COMMUNITY)
Admission: EM | Admit: 2013-11-05 | Discharge: 2013-11-05 | Disposition: A | Discharge status: Home / Self Care | Payer: Medicare Other | Attending: Emergency Medicine | Admitting: Emergency Medicine

## 2013-11-05 ENCOUNTER — Encounter (HOSPITAL_COMMUNITY): Payer: Self-pay | Admitting: Emergency Medicine

## 2013-11-05 DIAGNOSIS — Z87891 Personal history of nicotine dependence: Secondary | ICD-10-CM | POA: Insufficient documentation

## 2013-11-05 DIAGNOSIS — N401 Enlarged prostate with lower urinary tract symptoms: Secondary | ICD-10-CM | POA: Insufficient documentation

## 2013-11-05 DIAGNOSIS — Y846 Urinary catheterization as the cause of abnormal reaction of the patient, or of later complication, without mention of misadventure at the time of the procedure: Secondary | ICD-10-CM | POA: Insufficient documentation

## 2013-11-05 DIAGNOSIS — Z85828 Personal history of other malignant neoplasm of skin: Secondary | ICD-10-CM | POA: Insufficient documentation

## 2013-11-05 DIAGNOSIS — Z79899 Other long term (current) drug therapy: Secondary | ICD-10-CM | POA: Insufficient documentation

## 2013-11-05 DIAGNOSIS — Z860100 Personal history of colon polyps, unspecified: Secondary | ICD-10-CM | POA: Insufficient documentation

## 2013-11-05 DIAGNOSIS — I10 Essential (primary) hypertension: Secondary | ICD-10-CM | POA: Insufficient documentation

## 2013-11-05 DIAGNOSIS — Z7901 Long term (current) use of anticoagulants: Secondary | ICD-10-CM | POA: Insufficient documentation

## 2013-11-05 DIAGNOSIS — D649 Anemia, unspecified: Secondary | ICD-10-CM | POA: Insufficient documentation

## 2013-11-05 DIAGNOSIS — I2510 Atherosclerotic heart disease of native coronary artery without angina pectoris: Secondary | ICD-10-CM | POA: Insufficient documentation

## 2013-11-05 DIAGNOSIS — I5020 Unspecified systolic (congestive) heart failure: Secondary | ICD-10-CM | POA: Insufficient documentation

## 2013-11-05 DIAGNOSIS — Z9889 Other specified postprocedural states: Secondary | ICD-10-CM | POA: Insufficient documentation

## 2013-11-05 DIAGNOSIS — I4891 Unspecified atrial fibrillation: Secondary | ICD-10-CM | POA: Insufficient documentation

## 2013-11-05 DIAGNOSIS — F410 Panic disorder [episodic paroxysmal anxiety] without agoraphobia: Secondary | ICD-10-CM | POA: Insufficient documentation

## 2013-11-05 DIAGNOSIS — T83091A Other mechanical complication of indwelling urethral catheter, initial encounter: Secondary | ICD-10-CM | POA: Insufficient documentation

## 2013-11-05 DIAGNOSIS — E785 Hyperlipidemia, unspecified: Secondary | ICD-10-CM | POA: Insufficient documentation

## 2013-11-05 DIAGNOSIS — Z86718 Personal history of other venous thrombosis and embolism: Secondary | ICD-10-CM | POA: Insufficient documentation

## 2013-11-05 DIAGNOSIS — N138 Other obstructive and reflux uropathy: Secondary | ICD-10-CM | POA: Insufficient documentation

## 2013-11-05 MED ORDER — LIDOCAINE HCL 2 % EX GEL
Freq: Once | CUTANEOUS | Status: AC
  Administered 2013-11-05: 10 via URETHRAL
  Filled 2013-11-05: qty 10

## 2013-11-05 NOTE — ED Provider Notes (Signed)
CSN: [id number]     Arrival date & time 11/05/13  0034 History   First MD Initiated Contact with Patient 11/05/13 [phone]     Chief Complaint  Patient presents with  . Urinary Retention     (Consider location/radiation/quality/duration/timing/severity/associated sxs/prior Treatment) HPI 80 year old male has an indwelling Foley catheter. There was very little output during the course of the day today. Catheter was irrigated at home and that home health nurse was not able to successfully irrigated so they came into the ED. This catheter has been in place for about 2 weeks. He normally gets catheter is replaced once a month. While on the way here, the catheter started draining spontaneously and he has had 500 mL of urine output since then. He has no complaints currently. Past Medical History  Diagnosis Date  . Anemia     nos  . History of colonic polyps   . Hyperlipidemia   . Hypertension   . Cancer [phone]    hx of basal cell  . BPH (benign prostatic hyperplasia)   . Anxiety attack     panic   . Pre-syncope     echo 03/2010,with EF 60%,mild RV dilation,no significant abnormalities...event monitor for 3wks/episodes os sinus bradycardia with heart rate to low 40's occasionally at night(suspect while sleeping)  . Atrial fibrillation     a. Dx 11/2012 - TEE with possible LAA thrombus, started on amio/coumadin.  . Systolic CHF     a. EF 123XX123 11/2012 ?tachy-mediated. b. Not on ACEI at dc due to AKI, can consider as OP.  [name] CAD (coronary artery disease)     a. Mod CAD by cath 12/10/12 - 60% long proximal LAD, 60% prox PDA.  [name] AKI (acute kidney injury)     a. Peak Cr 4.17 11/2012 - felt to be post-obstructive.  . Urinary retention     a. Post obstructive, dc'd with foley 12/2012.  [name] Renal cyst     a. By renal US 11/2012, not candidate for further eval.  . Thrombus of left atrial appendage     a. Possible LAA thrombus by TEE 11/2012.   Past Surgical History  Procedure Laterality Date  .  Colonoscopy w/ polypectomy  2007  . Cholecystectomy    . Total knee arthroplasty    . Mohs surgery  2010  . Tee without cardioversion N/A 12/11/2012    Procedure: TRANSESOPHAGEAL ECHOCARDIOGRAM (TEE);  Surgeon: Thayer Headings, MD;  Location: [facility];  Service: Cardiovascular;  Laterality: N/A;  ja/bev.  Darden Dates without cardioversion N/A 01/21/2013    Procedure: TRANSESOPHAGEAL ECHOCARDIOGRAM (TEE);  Surgeon: Larey Dresser, MD;  Location: [place];  Service: Cardiovascular;  Laterality: N/A;  . Cardioversion N/A 01/21/2013    Procedure: CARDIOVERSION;  Surgeon: Larey Dresser, MD;  Location: [facility] ENDOSCOPY;  Service: Cardiovascular;  Laterality: N/A;   Family History  Problem Relation Age of Onset  . Heart attack Father   . Heart failure Father 19    S/P Turp  . Heart disease Father 33    MI.Marland KitchenMarland KitchenCHF S/P TURP @ 1  . Stroke Brother 28  . Coronary artery disease Brother   . Lung cancer Brother    History  Substance Use Topics  . Smoking status: Former Smoker    Quit date: 08/22/1983  . Smokeless tobacco: Not on file  . Alcohol Use: No    Review of Systems    Allergies  Darifenacin hydrobromide and Fluoxetine hcl  Home Medications   Current Outpatient Rx  Name  Route  Sig  Dispense  Refill  . acetaminophen (TYLENOL) 325 MG tablet   Oral   Take 650 mg by mouth every 6 (six) hours as needed for moderate pain.         [name] amiodarone (PACERONE) 200 MG tablet   Oral   Take 100 mg by mouth daily.         [name] buPROPion (WELLBUTRIN) 75 MG tablet   Oral   Take 75 mg by mouth 2 (two) times daily.         . clonazePAM (KLONOPIN) 0.5 MG tablet   Oral   Take 0.5-1 mg by mouth 2 (two) times daily. 0.5 pm in the am and 1mg  in the pm         . finasteride (PROSCAR) 5 MG tablet   Oral   Take 1 tablet (5 mg total) by mouth daily.   30 tablet   0     Follow up with urologist for refills on this medic ...   . furosemide (LASIX) 40 MG tablet   Oral   Take 40 mg by  mouth daily.         [name] L-Methylfolate-B12-B6-B2 (L-METHYL-MC PO)   Oral   Take 1 tablet by mouth 2 (two) times daily.          [name] lisinopril (PRINIVIL,ZESTRIL) 5 MG tablet   Oral   Take 1 tablet (5 mg total) by mouth daily.   30 tablet   4   . Meth-Hyo-M Bl-Na Phos-Ph Sal (URIBEL PO)   Oral   Take 1 capsule by mouth 4 (four) times daily as needed (bladder spasm).         . Multiple Vitamin (MULTIVITAMIN) tablet   Oral   Take 1 tablet by mouth daily.         . phenazopyridine (PYRIDIUM) 100 MG tablet   Oral   Take 100 mg by mouth 3 (three) times daily as needed for pain.         . pravastatin (PRAVACHOL) 80 MG tablet   Oral   Take 80 mg by mouth every evening. Night time         . Probiotic Product (ALIGN) 4 MG CAPS   Oral   Take 1 capsule by mouth.         . warfarin (COUMADIN) 2.5 MG tablet   Oral   Take 2.5-3.75 mg by mouth daily. 2.5mg  on M, T, W, F, Sat. 3.75mg  on Sun, Thurs.          BP 122/63  Pulse 71  Temp(Src) 97.8 F (36.6 C) (Oral)  Resp 16  SpO2 99% Physical Exam 80 year old male, resting comfortably and in no acute distress. Vital signs are normal. Oxygen saturation is 99%, which is normal. Head is normocephalic and atraumatic. PERRLA, EOMI. Oropharynx is clear. Neck is nontender and supple without adenopathy or JVD. Back is nontender and there is no CVA tenderness. Lungs are clear without rales, wheezes, or rhonchi. Chest is nontender. Heart has regular rate and rhythm without murmur. Abdomen is soft, flat, nontender without masses or hepatosplenomegaly and peristalsis is normoactive. Genitalia: Foley catheter is in place with a significant amount of sediment present in the drainage tube. There is no bladder distention. Extremities have no cyanosis or edema, full range of motion is present. Skin is warm and dry without rash. Neurologic: Mental status is normal, cranial nerves are intact, there are no motor or sensory deficits.  ED  Course  Procedures (including critical care time) Labs Review Labs Reviewed - No data to display Imaging Review No results found.   EKG Interpretation None      MDM   Final diagnoses:  Obstruction of Foley catheter    Foley catheter obstruction which has resolved prior to his arriving in the ED. However, there is a lot of sediment in the drainage tube and I strongly suspect that his catheter it is nearly obstructed and he would be better served with getting a new catheter in place. Catheter is exchanged and he is discharged.    Delora Fuel, MD 59/93/57 [phone]

## 2013-11-05 NOTE — Discharge Instructions (Signed)
Foley Catheter Care, Adult  A Foley catheter is a soft, flexible tube that is placed into the bladder to drain urine. A Foley catheter may be inserted if:   You leak urine or are not able to control when you urinate (urinary incontinence).   You are not able to urinate when you need to (urinary retention).   You had prostate surgery or surgery on the genitals.   You have certain medical conditions, such as multiple sclerosis, dementia, or a spinal cord injury.  If you are going home with a Foley catheter in place, follow the instructions below.  TAKING CARE OF THE CATHETER  1. Wash your hands with soap and water.  2. Using mild soap and warm water on a clean washcloth:   Clean the area on your body closest to the catheter insertion site using a circular motion, moving away from the catheter. Never wipe toward the catheter because this could sweep bacteria up into the urethra and cause infection.   Remove all traces of soap. Pat the area dry with a clean towel. For males, reposition the foreskin.  3. Attach the catheter to your leg so there is no tension on the catheter. Use adhesive tape or a leg strap. If you are using adhesive tape, remove any sticky residue left behind by the previous tape you used.  4. Keep the drainage bag below the level of the bladder, but keep it off the floor.  5. Check throughout the day to be sure the catheter is working and urine is draining freely. Make sure the tubing does not become kinked.  6. Do not pull on the catheter or try to remove it. Pulling could damage internal tissues.  TAKING CARE OF THE DRAINAGE BAGS  You will be given two drainage bags to take home. One is a large overnight drainage bag, and the other is a smaller leg bag that fits underneath clothing. You may wear the overnight bag at any time, but you should never wear the smaller leg bag at night. Follow the instructions below for how to empty, change, and clean your drainage bags.  Emptying the Drainage  Bag  You must empty your drainage bag when it is    full or at least 2 3 times a day.  1. Wash your hands with soap and water.  2. Keep the drainage bag below your hips, below the level of your bladder. This stops urine from going back into the tubing and into your bladder.  3. Hold the dirty bag over the toilet or a clean container.  4. Open the pour spout at the bottom of the bag and empty the urine into the toilet or container. Do not let the pour spout touch the toilet, container, or any other surface. Doing so can place bacteria on the bag, which can cause an infection.  5. Clean the pour spout with a gauze pad or cotton ball that has rubbing alcohol on it.  6. Close the pour spout.  7. Attach the bag to your leg with adhesive tape or a leg strap.  8. Wash your hands well.  Changing the Drainage Bag  Change your drainage bag once a month or sooner if it starts to smell bad or look dirty. Below are steps to follow when changing the drainage bag.  1. Wash your hands with soap and water.  2. Pinch off the rubber catheter so that urine does not spill out.  3. Disconnect the catheter tube from   the drainage tube at the connection valve. Do not let the tubes touch any surface.  4. Clean the end of the catheter tube with an alcohol wipe. Use a different alcohol wipe to clean the end of the drainage tube.  5. Connect the catheter tube to the drainage tube of the clean drainage bag.  6. Attach the new bag to the leg with adhesive tape or a leg strap. Avoid attaching the new bag too tightly.  7. Wash your hands well.  Cleaning the Drainage Bag  1. Wash your hands with soap and water.  2. Wash the bag in warm, soapy water.  3. Rinse the bag thoroughly with warm water.  4. Fill the bag with a solution of white vinegar and water (1 cup vinegar to 1 qt warm water [.2 L vinegar to 1 L warm water]). Close the bag and soak it for 30 minutes in the solution.  5. Rinse the bag with warm water.  6. Hang the bag to dry with the  pour spout open and hanging downward.  7. Store the clean bag (once it is dry) in a clean plastic bag.  8. Wash your hands well.  PREVENTING INFECTION   Wash your hands before and after handling your catheter.   Take showers daily and wash the area where the catheter enters your body. Do not take baths. Replace wet leg straps with dry ones, if this applies.   Do not use powders, sprays, or lotions on the genital area. Only use creams, lotions, or ointments as directed by your caregiver.   For females, wipe from front to back after each bowel movement.   Drink enough fluids to keep your urine clear or pale yellow unless you have a fluid restriction.   Do not let the drainage bag or tubing touch or lie on the floor.   Wear cotton underwear to absorb moisture and to keep your skin drier.  SEEK MEDICAL CARE IF:    Your urine is cloudy or smells unusually bad.   Your catheter becomes clogged.   You are not draining urine into the bag or your bladder feels full.   Your catheter starts to leak.  SEEK IMMEDIATE MEDICAL CARE IF:    You have pain, swelling, redness, or pus where the catheter enters the body.   You have pain in the abdomen, legs, lower back, or bladder.   You have a fever.   You see blood fill the catheter, or your urine is pink or red.   You have nausea, vomiting, or chills.   Your catheter gets pulled out.  MAKE SURE YOU:    Understand these instructions.   Will watch your condition.   Will get help right away if you are not doing well or get worse.  Document Released: 08/07/2005 Document Revised: 12/02/2012 Document Reviewed: 07/29/2012  Exit[employer] Patient Information 2014 [employer].

## 2013-11-05 NOTE — ED Notes (Signed)
Bed: PQ33 Expected date:  Expected time:  Means of arrival:  Comments: EMS blocked foley cath

## 2013-11-05 NOTE — ED Notes (Signed)
No urine shown on bladder scan. 525cc of yellow urine with sediment emptied from catheter upon arrival.

## 2013-11-05 NOTE — ED Notes (Signed)
Patient's home health nurse attempted to irrigate foley and could not after patient had not been able to pass urine.

## 2013-11-10 ENCOUNTER — Other Ambulatory Visit: Payer: Self-pay | Admitting: Cardiology

## 2013-11-17 ENCOUNTER — Ambulatory Visit (INDEPENDENT_AMBULATORY_CARE_PROVIDER_SITE_OTHER): Payer: Medicare Other | Admitting: Pharmacist

## 2013-11-17 DIAGNOSIS — I4891 Unspecified atrial fibrillation: Secondary | ICD-10-CM

## 2013-11-17 DIAGNOSIS — Z7901 Long term (current) use of anticoagulants: Secondary | ICD-10-CM

## 2013-11-17 LAB — POCT INR: INR: 2.1

## 2013-11-20 ENCOUNTER — Telehealth: Payer: Self-pay | Admitting: *Deleted

## 2013-11-20 MED ORDER — CLONAZEPAM 0.5 MG PO TABS: ORAL_TABLET | ORAL | Status: DC

## 2013-11-20 NOTE — Telephone Encounter (Signed)
Rx printed and faxed to the pharmacy.  Pt needs office visit.//AB/CMA

## 2013-11-20 NOTE — Telephone Encounter (Signed)
Requesting Clonazepam 0.5mg -Not sure of the directions. Last refill:10-15-13 Last OV:03-20-13 UDS:04-09-13-Low risk Please advise.//AB/CMA

## 2013-11-20 NOTE — Telephone Encounter (Signed)
OK X1 but refills require OV 

## 2013-11-24 ENCOUNTER — Other Ambulatory Visit: Payer: Self-pay | Admitting: Cardiology

## 2013-12-08 ENCOUNTER — Other Ambulatory Visit: Payer: Self-pay | Admitting: Cardiology

## 2013-12-15 ENCOUNTER — Other Ambulatory Visit: Payer: Self-pay | Admitting: Podiatrist

## 2013-12-15 ENCOUNTER — Other Ambulatory Visit: Payer: Self-pay | Admitting: Cardiology

## 2013-12-19 ENCOUNTER — Ambulatory Visit (INDEPENDENT_AMBULATORY_CARE_PROVIDER_SITE_OTHER): Payer: Medicare Other | Admitting: Internal Medicine

## 2013-12-19 VITALS — BP 96/60 | HR 80 | Temp 97.0°F | Resp 15

## 2013-12-19 DIAGNOSIS — G479 Sleep disorder, unspecified: Secondary | ICD-10-CM

## 2013-12-19 MED ORDER — CLONAZEPAM 0.5 MG PO TABS: ORAL_TABLET | ORAL | Status: DC

## 2013-12-19 NOTE — Progress Notes (Signed)
Pre visit review using our clinic review tool, if applicable. No additional management support is needed unless otherwise documented below in the visit note. 

## 2013-12-19 NOTE — Patient Instructions (Signed)
To prevent sleep dysfunction follow these instructions for sleep hygiene. Do not read, watch TV, or eat in bed. Do not get into bed until you are ready to turn off the light &  to go to sleep. Do not ingest stimulants ( decongestants, diet pills, nicotine, caffeine) after the evening meal.Do not take daytime naps.

## 2013-12-21 NOTE — Progress Notes (Signed)
   Subjective:    Patient ID: [name], male    DOB: 05-03-1925, 80 y.o.   MRN: [id number]  HPI He is here today for refill of his clonazepam. This specifically was to discuss the potential adverse effects of this drug particularly if used as maintenance agent. He had been taking one pill in the morning and two pills at night for sleep. He indeed was not taking the clonazepam for anxiety in the morning but simply using it as a maintenance medication.  I reviewed with him, his daughter, wife the data from Dr. Elliot Cousin' studies concerning risk of habituation ,altered mental status and increased risk of falls. This was of specific concern because of his relative hypotension & advanced age with expected decrease in renal & hepatic function.   Review of Systems He is on very low dose lisinopril prophylactically. He's also amiodarone and furosemide. He is actively being monitored by a urologist. He expresses no active concerns.       Objective:   Physical Exam He was well groomed and appeared adequately nourished. Clinically he was alert and focused. This is in  contrast to prior visits when he appeared disheveled, agitated and extremely anxious. No scleral icterus or jaundice present. He has no lymphadenopathy about the head, neck, axilla Breath sounds are decreased there is no increase worker breathing Rhythm is essentially regular with slight flow murmur. Foley catheter in place He is in a wheelchair. Strength and tone appeared grossly normal. He was receptive to discussion concerning the clonazepam. He expressed willingness to take the clonazepam in the morning only if anxious.        Assessment & Plan:  #1 sleep disorder #2 anxiety/ depression  Clinically significantly improved See orders & AVS

## 2013-12-22 ENCOUNTER — Other Ambulatory Visit: Payer: Self-pay | Admitting: Cardiology

## 2013-12-28 ENCOUNTER — Other Ambulatory Visit: Payer: Self-pay | Admitting: Cardiology

## 2013-12-29 ENCOUNTER — Ambulatory Visit (INDEPENDENT_AMBULATORY_CARE_PROVIDER_SITE_OTHER): Payer: Medicare Other | Admitting: *Deleted

## 2013-12-29 DIAGNOSIS — I4891 Unspecified atrial fibrillation: Secondary | ICD-10-CM

## 2013-12-29 DIAGNOSIS — Z7901 Long term (current) use of anticoagulants: Secondary | ICD-10-CM

## 2013-12-29 LAB — POCT INR: INR: 2.3

## 2014-01-07 ENCOUNTER — Other Ambulatory Visit: Payer: Self-pay | Admitting: Internal Medicine

## 2014-01-07 ENCOUNTER — Other Ambulatory Visit: Payer: Self-pay | Admitting: Cardiology

## 2014-01-07 NOTE — Telephone Encounter (Signed)
OK X1 

## 2014-01-07 NOTE — Telephone Encounter (Signed)
Last ov 12/19/2013

## 2014-01-13 ENCOUNTER — Other Ambulatory Visit: Payer: Self-pay

## 2014-01-13 NOTE — Telephone Encounter (Signed)
Patient last seen 12/19/13 and med was refilled at this time also

## 2014-01-13 NOTE — Telephone Encounter (Signed)
OK X1 

## 2014-01-14 ENCOUNTER — Ambulatory Visit (INDEPENDENT_AMBULATORY_CARE_PROVIDER_SITE_OTHER): Payer: Medicare Other | Admitting: Cardiology

## 2014-01-14 ENCOUNTER — Encounter: Payer: Self-pay | Admitting: Cardiology

## 2014-01-14 VITALS — BP 110/58

## 2014-01-14 DIAGNOSIS — E785 Hyperlipidemia, unspecified: Secondary | ICD-10-CM

## 2014-01-14 DIAGNOSIS — I5022 Chronic systolic (congestive) heart failure: Secondary | ICD-10-CM

## 2014-01-14 DIAGNOSIS — I4891 Unspecified atrial fibrillation: Secondary | ICD-10-CM

## 2014-01-14 DIAGNOSIS — I509 Heart failure, unspecified: Secondary | ICD-10-CM

## 2014-01-14 DIAGNOSIS — R0609 Other forms of dyspnea: Secondary | ICD-10-CM

## 2014-01-14 DIAGNOSIS — R0989 Other specified symptoms and signs involving the circulatory and respiratory systems: Secondary | ICD-10-CM

## 2014-01-14 LAB — HEPATIC FUNCTION PANEL
ALBUMIN: 3.3 g/dL — AB (ref 3.5–5.2)
ALT: 14 U/L (ref 0–53)
AST: 19 U/L (ref 0–37)
Alkaline Phosphatase: 59 U/L (ref 39–117)
Bilirubin, Direct: 0 mg/dL (ref 0.0–0.3)
TOTAL PROTEIN: 6.5 g/dL (ref 6.0–8.3)
Total Bilirubin: 0.4 mg/dL (ref 0.2–1.2)

## 2014-01-14 LAB — BASIC METABOLIC PANEL
BUN: 28 mg/dL — AB (ref 6–23)
CALCIUM: 9 mg/dL (ref 8.4–10.5)
CO2: 25 mEq/L (ref 19–32)
Chloride: 103 mEq/L (ref 96–112)
Creatinine, Ser: 1.3 mg/dL (ref 0.4–1.5)
GFR: 57.51 mL/min — ABNORMAL LOW (ref >60.00)
Glucose, Bld: 124 mg/dL — ABNORMAL HIGH (ref 70–99)
Potassium: 4.3 mEq/L (ref 3.5–5.1)
Sodium: 137 mEq/L (ref 135–145)

## 2014-01-14 LAB — BRAIN NATRIURETIC PEPTIDE: Pro B Natriuretic peptide (BNP): 298 pg/mL — ABNORMAL HIGH (ref 0.0–100.0)

## 2014-01-14 LAB — TSH: TSH: 1.5 u[IU]/mL (ref 0.35–4.50)

## 2014-01-14 MED ORDER — CLONAZEPAM 0.5 MG PO TABS: ORAL_TABLET | ORAL | Status: DC

## 2014-01-14 NOTE — Progress Notes (Signed)
Patient ID: [name], male   DOB: 06-Mar-1925, 80 y.o.   MRN: [id number] PCP: Dr. Linna Darner  80 yo presents for cardiology followup after admission in the Spring to [facility] with atrial fibrillation/RVR and acute systolic CHF.  He was admitted in 4/14 with atrial fibrillation/RVR and volume overloaded.  Echo showed EF 25-30%.  He had a LHC showed 60% pLAD stenosis that was thought to not be hemodynamically significant.  TEE showed possible LAA thrombus, so DCCV was not done initially.  He was started on amiodarone and Toprol XL for HR control with plan to anticoagulate for 4 weeks followed by DCCV.  While in the hospital, he had AKI due to postrenal obstruction (BPH) and now has an indwelling foley.  Urology is following.  Creatinine improved back to normal range.  Patient additionally has significant depression.  He was diuresed, rate controlled, anticoagulated, and discharged to [place] home.  In 6/14, he had TEE-guided DCCV to NSR.  The TEE showed no LAA thrombus and EF remained 25-30%.  Echo in 10/14 showed EF 40% with diffuse hypokinesis.  He is now home from the nursing home. Toprol XL dose has been decreased due to bradycardia in the 40s.  HR is now higher, he is in an accelerated junctional rhythm today with HR in the 70s.   [name] is symptomatically stable.  He still has the foley in.   He has 24 hour care at home.  He is walking with a walker around the house.  Mild dyspnea with longer walking.  No chest pain.  No PND but does sleep with 3 pillows.  No lightheadedness, syncope, or falls.  No tachypalpitations.  He does not have much energy and is not sleeping well.   Labs (4/14): AST 82, ALT 89 Labs (5/14): K 4.5, creatinine 1.2, HCT 38.4, LFTs normal, TSH normal Labs (6/14): K 4.1, creatinine 0.96 Labs (8/14): LDL 24, HDL 43 Labs (9/14): K 3.9, creatinine 1.1 Labs (2/15): K 4.2, creatinine 1.23  ECG: accelerated junctional rhythm with HR 78  Past Medical History:  1.  Anemia 2. Colonic polyps  3. Hyperlipidemia  4. Hypertension  5. Skin cancer, hx of, Basal cell 2010, 1989  6. Right TKR  7. BPH  8. Panic attacks/generalized anxiety/depression 9. Presyncope in 2011: Echo (8/11) with EF 60%, normal RV size and systolic function, no significant valvular abnormalities. Carotid dopplers (8/11): minimal carotid stenosis. Event monitor (10/11) for 3 wks: episodes of sinus bradycardia with heart rate to low 40s occasionally at night (suspect while sleeping). No other significant events.  10. Atrial fibrillation: First noted in 4/14.  Possible LAA thrombus on TEE in 4/14.  TEE (6/14) with no LAA thrombus.  DCCV to NSR in 6/14. Patient on amiodarone.  11. Systolic CHF: Nonischemic cardiomyopathy.  Echo (4/14) with EF 25-30%, diffuse hypokinesis with basal inferior and inferoseptal akinesis, moderate-severe William, moderately dilated RV with moderately decreased systolic function, PA systolic pressure 43 mmHg.  LHC (4/14) with 60% pLAD stenosis thought not to be hemodynamically significant.  Possible tachycardia-mediated cardiomyopathy.  TEE (6/14) with EF 25-30%, global hypokinesis, mild William.  Echo (10/14) with EF 40%, diffuse hypokinesis, mild LV dilation, moderately dilated RV with mildly decreased systolic function.  12. CAD: LHC (4/14) with 60% pLAD stenosis.  13. Elevated LFTs 14. Mitral regurgitation: Moderate to severe on echo in 4/14 but followup TEE showed only mild-moderate William.  TEE (6/14) with mild William.  15. Post-renal AKI: Has indwelling foley now for  BPH.   Family History:  Father: MI @ 29, CHF S/P TURP @ 64  Mother: d 42  Siblings: bro d 76 CVAs; bro d 68 CAD; bro d 34 lung CA   Social History:  Retired  Married  Former Smoker:quit 1985   ROS: All systems reviewed and negative except as per HPI.   Current Outpatient Prescriptions  Medication Sig Dispense Refill  . acetaminophen (TYLENOL) 325 MG tablet Take 650 mg by mouth every 6 (six) hours as needed  for moderate pain.      [name] amiodarone (PACERONE) 200 MG tablet TAKE ONE-HALF TABLET (100 MG TOTAL) BY MOUTH DAILY.  15 tablet  0  . buPROPion (WELLBUTRIN) 75 MG tablet TAKE 1 TABLET TWICE A DAY  60 tablet  0  . clonazePAM (KLONOPIN) 0.5 MG tablet take 0.5 pm in the am  AS NEEDED and 0.5 mg 1-2 pills @ bedtime AS NEEDED ONLY  90 tablet  0  . finasteride (PROSCAR) 5 MG tablet Take 1 tablet (5 mg total) by mouth daily.  30 tablet  0  . furosemide (LASIX) 40 MG tablet TAKE 1 TABLET (40 MG TOTAL) BY MOUTH DAILY.  30 tablet  0  . l-methylfolate-B6-B12 (METANX) 3-35-2 MG TABS TAKE 1 TABLET TWICE A DAY  60 tablet  4  . lisinopril (PRINIVIL,ZESTRIL) 5 MG tablet TAKE 1 TABLET (5 MG TOTAL) BY MOUTH DAILY.  30 tablet  1  . Meth-Hyo-M Bl-Na Phos-Ph Sal (URIBEL PO) Take 1 capsule by mouth 4 (four) times daily as needed (bladder spasm).      . Multiple Vitamin (MULTIVITAMIN) tablet Take 1 tablet by mouth daily.      . phenazopyridine (PYRIDIUM) 100 MG tablet Take 100 mg by mouth 3 (three) times daily as needed for pain.      . pravastatin (PRAVACHOL) 80 MG tablet TAKE 1 TABLET (80 MG TOTAL) BY MOUTH DAILY.  30 tablet  0  . Probiotic Product (ALIGN) 4 MG CAPS Take 1 capsule by mouth.      . warfarin (COUMADIN) 2.5 MG tablet Take 2.5-3.75 mg by mouth daily. 2.5mg  on M, T, W, F, Sat. 3.75mg  on Sun, Thurs.       No current facility-administered medications for this visit.    BP 110/58 General: NAD, chronically ill appearing Neck: JVP 7 cm, no thyromegaly or thyroid nodule.  Lungs: Clear to auscultation bilaterally with normal respiratory effort. CV: Nondisplaced PMI.  Heart regular S1/S2, no S3/S4, 2/6 SEM.  1+ ankle edema bilaterally.  No carotid bruit.  Abdomen: Soft, nontender, no hepatosplenomegaly, no distention.  Neurologic: Alert and oriented x 3.  Psych: Normal affect. Extremities: No clubbing or cyanosis. Right foot ulcer healing.   Assessment/Plan: 1. Chronic systolic CHF: EF 74% with mildly  decreased RV function on 10/14 echo.  Possible tachycardia-mediated CMP.  LHC showed 60% pLAD but suspect not hemodynamically significant. He looks euvolemic on exam.  Not very active, symptoms probably NYHA class III.  - He is off Toprol XL with bradycardia.  - Continue current Lasix. - Continue lisinopril 5 mg daily.   - Check BMET/BNP today.  2. Atrial fibrillation: He is in an accelerated junctional rhythm today with HR 78.  - Check LFTs/TSH today.  - Needs yearly eye exam while on amiodarone.    - I will arrange PFTs given amiodarone use.  3. CAD: 60% pLAD stenosis on cath, doubt hemodynamic significance.  Continue coumadin, atorvastatin.    4. CKD: Creatinine stable.  5. Hyperlipidemia: Continue  statin, good LDL in 8/14.   Larey Dresser 01/14/2014

## 2014-01-14 NOTE — Patient Instructions (Addendum)
Your physician recommends that you return for lab work today--BMET/BNP/TSH/Liver profile.  Your physician has recommended that you have a pulmonary function test. Pulmonary Function Tests are a group of tests that measure how well air moves in and out of your lungs.  Your physician recommends that you schedule a follow-up appointment in: 3 months with Dr Aundra Dubin  Dr Aundra Dubin recommends that you see your eye doctor every year for a check-up since you take amiodarone.

## 2014-01-15 NOTE — Addendum Note (Signed)
Addended by: Katrine Coho on: 01/15/2014 10:05 AM   Modules accepted: Orders

## 2014-01-19 ENCOUNTER — Other Ambulatory Visit: Payer: Self-pay | Admitting: Cardiology

## 2014-01-23 ENCOUNTER — Other Ambulatory Visit: Payer: Self-pay | Admitting: Cardiology

## 2014-02-03 ENCOUNTER — Telehealth: Payer: Self-pay | Admitting: Internal Medicine

## 2014-02-03 NOTE — Telephone Encounter (Signed)
[name] has been notified and for now will keep patient appt

## 2014-02-03 NOTE — Telephone Encounter (Signed)
Evaluation needed to rule out early sepsis from UTI or CAP

## 2014-02-03 NOTE — Telephone Encounter (Signed)
Left message to return call 

## 2014-02-03 NOTE — Telephone Encounter (Signed)
[name] request phone call from the assistant concern about [name] health condition. Please call her [phone].

## 2014-02-03 NOTE — Telephone Encounter (Signed)
Phone call to [place]. She states patient has an appointment tomorrow with Dr Linna Darner but does not feel up to coming. Patient can not get rid of his cough he's had for 2 weeks, not eating, no fever, no disorientation, vitals are good, not walking well. Catheter was to be changed this week but patient does not feel like going. Please advise what to do. Call EMS for transport to ER?

## 2014-02-04 ENCOUNTER — Ambulatory Visit: Payer: Medicare Other | Admitting: Internal Medicine

## 2014-02-06 ENCOUNTER — Other Ambulatory Visit: Payer: Self-pay | Admitting: Internal Medicine

## 2014-02-06 ENCOUNTER — Ambulatory Visit: Payer: Medicare Other | Admitting: Internal Medicine

## 2014-02-06 ENCOUNTER — Other Ambulatory Visit: Payer: Self-pay | Admitting: Cardiology

## 2014-02-06 NOTE — Telephone Encounter (Signed)
Done erx 

## 2014-02-06 NOTE — Telephone Encounter (Signed)
MD out of office, refill okay?

## 2014-02-16 ENCOUNTER — Other Ambulatory Visit: Payer: Self-pay

## 2014-02-16 ENCOUNTER — Telehealth: Payer: Self-pay | Admitting: Cardiology

## 2014-02-16 MED ORDER — CLONAZEPAM 0.5 MG PO TABS: ORAL_TABLET | ORAL | Status: DC

## 2014-02-16 NOTE — Telephone Encounter (Signed)
New problem     Pt's daughter called and needs a call back for pt to have home health care RN prescribe to him.   Pt is unable to leave the home.   Please give daughter a call back.

## 2014-02-16 NOTE — Telephone Encounter (Signed)
LMOM for daughter to call us back with [facility] agencies name, on VM informed her that they have to be in home for other treatments and not only INRs.

## 2014-02-16 NOTE — Telephone Encounter (Signed)
60

## 2014-02-16 NOTE — Telephone Encounter (Signed)
Pt's daughter, [name], asking if arrangements can be made for [facility] to do pro-times. Due to his medical condition it is hard for him to get to office  for pro-times.   [name] aware I will forward to [facility] to follow-up with her about this.

## 2014-02-17 NOTE — Telephone Encounter (Signed)
LMOM for daughter that [facility] has to in home for skill/treatment not only for INR checks. Did inform her that we could assist with getting father into and out of clinic with W/C if that could help so we can get his INR checked.

## 2014-02-25 ENCOUNTER — Ambulatory Visit (INDEPENDENT_AMBULATORY_CARE_PROVIDER_SITE_OTHER): Payer: Medicare Other | Admitting: Pharmacist

## 2014-02-25 DIAGNOSIS — Z7901 Long term (current) use of anticoagulants: Secondary | ICD-10-CM

## 2014-02-25 DIAGNOSIS — I4891 Unspecified atrial fibrillation: Secondary | ICD-10-CM

## 2014-02-25 LAB — POCT INR: INR: 1.1

## 2014-03-05 ENCOUNTER — Other Ambulatory Visit: Payer: Self-pay | Admitting: Cardiology

## 2014-03-05 ENCOUNTER — Other Ambulatory Visit: Payer: Self-pay

## 2014-03-06 MED ORDER — CLONAZEPAM 0.5 MG PO TABS: ORAL_TABLET | ORAL | Status: DC

## 2014-03-06 NOTE — Telephone Encounter (Signed)
OK X1 

## 2014-03-09 ENCOUNTER — Telehealth: Payer: Self-pay | Admitting: Internal Medicine

## 2014-03-09 MED ORDER — CLONAZEPAM 0.5 MG PO TABS: ORAL_TABLET | ORAL | Status: DC

## 2014-03-09 NOTE — Telephone Encounter (Signed)
I called and spoke to [facility] [phone] to clarify Clonazepam instructions as listed below.

## 2014-03-09 NOTE — Telephone Encounter (Signed)
1 in am & 1-2 in eve PRN only

## 2014-03-09 NOTE — Telephone Encounter (Signed)
Initial message is unclear. So phone call placed to [place] to get clarification. She states [facility] has directions that he takes Clonazepam 1 in the morning and 1 at night. But patient has been taking 1 in the morning and 2 at night. Please clarify directions so [facility] can be advised. Thanks

## 2014-03-09 NOTE — Telephone Encounter (Signed)
Joy (care giver) called stated [facility] will not refill Mr. Odland's Clonazepam dosage. Pt is taking 1 in the morning and 2 at night. Please give her a call

## 2014-03-11 ENCOUNTER — Ambulatory Visit (INDEPENDENT_AMBULATORY_CARE_PROVIDER_SITE_OTHER): Payer: Medicare Other | Admitting: *Deleted

## 2014-03-11 DIAGNOSIS — Z7901 Long term (current) use of anticoagulants: Secondary | ICD-10-CM

## 2014-03-11 DIAGNOSIS — I4891 Unspecified atrial fibrillation: Secondary | ICD-10-CM

## 2014-03-11 LAB — POCT INR: INR: 2.1

## 2014-03-23 ENCOUNTER — Encounter (INDEPENDENT_AMBULATORY_CARE_PROVIDER_SITE_OTHER): Payer: Medicare Other

## 2014-03-23 DIAGNOSIS — I5022 Chronic systolic (congestive) heart failure: Secondary | ICD-10-CM

## 2014-03-23 DIAGNOSIS — R0989 Other specified symptoms and signs involving the circulatory and respiratory systems: Secondary | ICD-10-CM

## 2014-03-23 DIAGNOSIS — R0609 Other forms of dyspnea: Secondary | ICD-10-CM

## 2014-03-23 DIAGNOSIS — I4891 Unspecified atrial fibrillation: Secondary | ICD-10-CM

## 2014-03-25 ENCOUNTER — Telehealth: Payer: Self-pay | Admitting: Internal Medicine

## 2014-03-25 ENCOUNTER — Telehealth: Payer: Self-pay | Admitting: *Deleted

## 2014-03-25 MED ORDER — CLONAZEPAM 0.5 MG PO TABS: ORAL_TABLET | ORAL | Status: DC

## 2014-03-25 NOTE — Telephone Encounter (Signed)
Caregiver states pt stated he use to get # 90 on his clonazepam. Pt states he takes 1 in the morning & 2 at bedtime. MD gave refill on 03/10/14 for # 60 which is not enough.Marland KitchenJohny Chess

## 2014-03-25 NOTE — Telephone Encounter (Signed)
Caregiver called in states patients klonopin tablets was cut from 90 to 60.  On the bottle they have the instructions is to take 1 tab in the am and 1-2 tabs in the pm.  This is causing the patient some anxiety.  Please advise.

## 2014-03-25 NOTE — Telephone Encounter (Signed)
OK for extra 30  Do not exceed 3/day

## 2014-03-26 NOTE — Telephone Encounter (Signed)
Called pt twice line still busy. Called caretaker Joy @ [phone] no answer LMOM RTC...[name]

## 2014-03-26 NOTE — Telephone Encounter (Signed)
Notified Joy with md response. [facility]. Also she want to pick up current med list inform will leave at front desk...Johny Chess

## 2014-04-08 ENCOUNTER — Other Ambulatory Visit: Payer: Self-pay | Admitting: Cardiology

## 2014-04-09 ENCOUNTER — Other Ambulatory Visit: Payer: Self-pay

## 2014-04-09 MED ORDER — CLONAZEPAM 0.5 MG PO TABS: ORAL_TABLET | ORAL | Status: DC

## 2014-04-09 NOTE — Telephone Encounter (Signed)
Please advise how to proceed. 03/06/14 #60 of Cl[facility]azepam [facility]. Caregiver expresses concern since patient normally gets #90 taking 1 in the morning then 1-2 in the evening as needed. An additional #30 of Clonazepam [facility] on [facility]14 and picked up from the pharmacy on 03/27/14.

## 2014-04-09 NOTE — Telephone Encounter (Signed)
On 03/26/14 please verify #60 sent to pharmacy. If so that is 4 pills/ day . It was not to exceed 2-3 /day.

## 2014-04-09 NOTE — Telephone Encounter (Signed)
This med was filled 03/25/14 #30 no refills  12/19/13 patient last seen by you

## 2014-04-09 NOTE — Telephone Encounter (Signed)
#  90; must last until @ least 05/10/14

## 2014-04-09 NOTE — Telephone Encounter (Signed)
[facility] to [facility]. [facility]epam was last picked up by the [facility] at [facility] 03/27/14 #30

## 2014-04-17 ENCOUNTER — Encounter: Payer: Self-pay | Admitting: Cardiology

## 2014-04-17 ENCOUNTER — Other Ambulatory Visit: Payer: Self-pay | Admitting: *Deleted

## 2014-04-17 ENCOUNTER — Ambulatory Visit (INDEPENDENT_AMBULATORY_CARE_PROVIDER_SITE_OTHER): Payer: Medicare Other

## 2014-04-17 ENCOUNTER — Ambulatory Visit (INDEPENDENT_AMBULATORY_CARE_PROVIDER_SITE_OTHER): Payer: Medicare Other | Admitting: Cardiology

## 2014-04-17 VITALS — BP 114/54 | HR 65 | Ht 72.0 in

## 2014-04-17 DIAGNOSIS — I10 Essential (primary) hypertension: Secondary | ICD-10-CM

## 2014-04-17 DIAGNOSIS — I480 Paroxysmal atrial fibrillation: Secondary | ICD-10-CM

## 2014-04-17 DIAGNOSIS — E785 Hyperlipidemia, unspecified: Secondary | ICD-10-CM

## 2014-04-17 DIAGNOSIS — I4891 Unspecified atrial fibrillation: Secondary | ICD-10-CM

## 2014-04-17 DIAGNOSIS — R0609 Other forms of dyspnea: Secondary | ICD-10-CM

## 2014-04-17 DIAGNOSIS — I5022 Chronic systolic (congestive) heart failure: Secondary | ICD-10-CM

## 2014-04-17 DIAGNOSIS — R0989 Other specified symptoms and signs involving the circulatory and respiratory systems: Secondary | ICD-10-CM

## 2014-04-17 DIAGNOSIS — I509 Heart failure, unspecified: Secondary | ICD-10-CM

## 2014-04-17 DIAGNOSIS — Z7901 Long term (current) use of anticoagulants: Secondary | ICD-10-CM

## 2014-04-17 LAB — LIPID PANEL
CHOL/HDL RATIO: 2
Cholesterol: 108 mg/dL (ref 0–200)
HDL: 46.4 mg/dL (ref >39.00)
LDL Cholesterol: 38 mg/dL (ref 0–99)
NonHDL: 61.6
Triglycerides: 117 mg/dL (ref 0.0–149.0)
VLDL: 23.4 mg/dL (ref 0.0–40.0)

## 2014-04-17 LAB — BASIC METABOLIC PANEL
BUN: 31 mg/dL — ABNORMAL HIGH (ref 6–23)
CO2: 29 mEq/L (ref 19–32)
Calcium: 9.4 mg/dL (ref 8.4–10.5)
Chloride: 102 mEq/L (ref 96–112)
Creatinine, Ser: 1.7 mg/dL — ABNORMAL HIGH (ref 0.4–1.5)
GFR: 40.96 mL/min — AB (ref >60.00)
Glucose, Bld: 126 mg/dL — ABNORMAL HIGH (ref 70–99)
POTASSIUM: 5.1 meq/L (ref 3.5–5.1)
SODIUM: 138 meq/L (ref 135–145)

## 2014-04-17 LAB — HEPATIC FUNCTION PANEL
ALK PHOS: 70 U/L (ref 39–117)
ALT: 16 U/L (ref 0–53)
AST: 22 U/L (ref 0–37)
Albumin: 3.4 g/dL — ABNORMAL LOW (ref 3.5–5.2)
BILIRUBIN DIRECT: 0 mg/dL (ref 0.0–0.3)
BILIRUBIN TOTAL: 0.6 mg/dL (ref 0.2–1.2)
Total Protein: 7 g/dL (ref 6.0–8.3)

## 2014-04-17 LAB — BRAIN NATRIURETIC PEPTIDE: PRO B NATRI PEPTIDE: 224 pg/mL — AB (ref 0.0–100.0)

## 2014-04-17 LAB — POCT INR: INR: 2.2

## 2014-04-17 LAB — TSH: TSH: 1.81 u[IU]/mL (ref 0.35–4.50)

## 2014-04-17 MED ORDER — LISINOPRIL 5 MG PO TABS: ORAL_TABLET | ORAL | Status: DC

## 2014-04-17 NOTE — Patient Instructions (Signed)
Increase lisinopril to 7.5mg  daily. This will be 1 and 1/2 of a 5mg  tablet daily at the same time.  Your physician recommends that you return for lab work today--Lipid profile/Liver profile/BMET/BNP/TSH.  Your physician recommends that you schedule a follow-up appointment in: 3 months with Dr Aundra Dubin.

## 2014-04-17 NOTE — Progress Notes (Signed)
Patient ID: [name], male   DOB: Oct 26, 1924, 80 y.o.   MRN: [id number] PCP: Dr. Linna Darner  80 yo presents for cardiology followup after admission in the Spring to [facility] with atrial fibrillation/RVR and acute systolic CHF.  He was admitted in 4/14 with atrial fibrillation/RVR and volume overloaded.  Echo showed EF 25-30%.  He had a LHC showed 60% pLAD stenosis that was thought to not be hemodynamically significant.  TEE showed possible LAA thrombus, so DCCV was not done initially.  He was started on amiodarone and Toprol XL for HR control with plan to anticoagulate for 4 weeks followed by DCCV.  While in the hospital, he had AKI due to postrenal obstruction (BPH) and now has an indwelling foley.  Urology is following.  Creatinine improved back to normal range.  Patient additionally has significant depression.  He was diuresed, rate controlled, anticoagulated, and discharged to [place] home.  In 6/14, he had TEE-guided DCCV to NSR.  The TEE showed no LAA thrombus and EF remained 25-30%.  Echo in 10/14 showed EF 40% with diffuse hypokinesis.  Toprol XL stopped due to bradycardia in the 40s.  HR is now higher, he is in NSR today.   [name] is symptomatically stable.  He still has the foley in.   He has 24 hour care at home.  He is walking with a walker around the house.  Mild dyspnea with longer walking.  Not very active at all.  No chest pain.  No PND but does sleep with 3 pillows.  No lightheadedness, syncope, or falls.  No tachypalpitations.  Still problems with insomnia.  ECG: NSR, borderline low voltage  Labs (4/14): AST 82, ALT 89 Labs (5/14): K 4.5, creatinine 1.2, HCT 38.4, LFTs normal, TSH normal Labs (6/14): K 4.1, creatinine 0.96 Labs (8/14): LDL 24, HDL 43 Labs (9/14): K 3.9, creatinine 1.1 Labs (2/15): K 4.2, creatinine 1.23 Labs (5/15): K 4.3, creatinine 1.3, BNP 298, LFTs/TSH normal  Past Medical History:  1. Anemia 2. Colonic polyps  3. Hyperlipidemia  4.  Hypertension  5. Skin cancer, hx of, Basal cell 2010, 1989  6. Right TKR  7. BPH  8. Panic attacks/generalized anxiety/depression 9. Presyncope in 2011: Echo (8/11) with EF 60%, normal RV size and systolic function, no significant valvular abnormalities. Carotid dopplers (8/11): minimal carotid stenosis. Event monitor (10/11) for 3 wks: episodes of sinus bradycardia with heart rate to low 40s occasionally at night (suspect while sleeping). No other significant events.  10. Atrial fibrillation: First noted in 4/14.  Possible LAA thrombus on TEE in 4/14.  TEE (6/14) with no LAA thrombus.  DCCV to NSR in 6/14. Patient on amiodarone.  11. Systolic CHF: Nonischemic cardiomyopathy.  Echo (4/14) with EF 25-30%, diffuse hypokinesis with basal inferior and inferoseptal akinesis, moderate-severe William, moderately dilated RV with moderately decreased systolic function, PA systolic pressure 43 mmHg.  LHC (4/14) with 60% pLAD stenosis thought not to be hemodynamically significant.  Possible tachycardia-mediated cardiomyopathy.  TEE (6/14) with EF 25-30%, global hypokinesis, mild William.  Echo (10/14) with EF 40%, diffuse hypokinesis, mild LV dilation, moderately dilated RV with mildly decreased systolic function.  12. CAD: LHC (4/14) with 60% pLAD stenosis.  13. Elevated LFTs 14. Mitral regurgitation: Moderate to severe on echo in 4/14 but followup TEE showed only mild-moderate William.  TEE (6/14) with mild William.  15. Post-renal AKI: Has indwelling foley now for BPH.   Family History:  Father: MI @ 46, CHF S/P TURP @  70  Mother: d 42  Siblings: bro d 13 CVAs; bro d 83 CAD; bro d 43 lung CA   Social History:  Retired  Married  Former [place] reviewed and negative except as per HPI.   Current Outpatient Prescriptions  Medication Sig Dispense Refill  . acetaminophen (TYLENOL) 325 MG tablet Take 650 mg by mouth every 6 (six) hours as needed for moderate pain.      [name] amiodarone (PACERONE)  200 MG tablet TAKE ONE-HALF TABLET (100 MG TOTAL) BY MOUTH DAILY.  15 tablet  0  . buPROPion (WELLBUTRIN) 75 MG tablet TAKE 1 TABLET TWICE A DAY  60 tablet  2  . clonazePAM (KLONOPIN) 0.5 MG tablet take 1 tablet in the morning and 1-2 tablets in the evening AS NEEDED ONLY  90 tablet  0  . finasteride (PROSCAR) 5 MG tablet Take 1 tablet (5 mg total) by mouth daily.  30 tablet  0  . furosemide (LASIX) 40 MG tablet TAKE 1 TABLET (40 MG TOTAL) BY MOUTH DAILY.  30 tablet  0  . l-methylfolate-B6-B12 (METANX) 3-35-2 MG TABS TAKE 1 TABLET TWICE A DAY  60 tablet  4  . Meth-Hyo-M Bl-Na Phos-Ph Sal (URIBEL PO) Take 1 capsule by mouth 4 (four) times daily as needed (bladder spasm).      . Multiple Vitamin (MULTIVITAMIN) tablet Take 1 tablet by mouth daily.      . phenazopyridine (PYRIDIUM) 100 MG tablet Take 100 mg by mouth 3 (three) times daily as needed for pain.      . pravastatin (PRAVACHOL) 80 MG tablet TAKE 1 TABLET BY MOUTH DAILY.  30 tablet  6  . Probiotic Product (ALIGN) 4 MG CAPS Take 1 capsule by mouth.      . warfarin (COUMADIN) 2.5 MG tablet TAKE AS DIRECTED BY ANTICOAGULATION CLINIC  100 tablet  1  . lisinopril (PRINIVIL,ZESTRIL) 5 MG tablet 1 and 1/2 tablets (7.5mg ) daily  45 tablet  6   No current facility-administered medications for this visit.    BP 114/54  Pulse 65  Ht 6' (1.829 m)  SpO2 94% General: NAD, chronically ill appearing Neck: JVP 7 cm, no thyromegaly or thyroid nodule.  Lungs: Clear to auscultation bilaterally with normal respiratory effort. CV: Nondisplaced PMI.  Heart regular S1/S2, no S3/S4, 2/6 SEM.  No edema.  No carotid bruit.  Abdomen: Soft, nontender, no hepatosplenomegaly, no distention.  Neurologic: Alert and oriented x 3.  Psych: Normal affect. Extremities: No clubbing or cyanosis. Right foot ulcer healing.   Assessment/Plan: 1. Chronic systolic CHF: EF 23% with mildly decreased RV function on 10/14 echo.  Possible tachycardia-mediated CMP.  LHC showed 60%  pLAD but suspect not hemodynamically significant. He looks euvolemic on exam.  Not very active, symptoms probably NYHA class III.  - He is off Toprol XL with bradycardia.  - Continue current Lasix. - Increase lisinopril to 7.5 mg daily.  - Check BMET/BNP today.  2. Atrial fibrillation: He is in NSR today on amiodarone.    - Check LFTs/TSH today.  - Needs yearly eye exam while on amiodarone.    - He was unable to perform PFTs (had planned given amiodarone use).  - Continue warfarin and amiodarone.  3. CAD: 60% pLAD stenosis on cath, doubt hemodynamic significance.  Continue coumadin, atorvastatin.    4. CKD: Creatinine stable.  5. Hyperlipidemia: Continue statin, check lipids today.   Loralie Champagne 04/17/2014

## 2014-05-04 ENCOUNTER — Other Ambulatory Visit: Payer: Self-pay | Admitting: Cardiology

## 2014-05-04 ENCOUNTER — Other Ambulatory Visit: Payer: Self-pay | Admitting: Internal Medicine

## 2014-05-06 ENCOUNTER — Ambulatory Visit: Payer: Medicare Other | Admitting: Podiatrist

## 2014-05-06 ENCOUNTER — Ambulatory Visit (INDEPENDENT_AMBULATORY_CARE_PROVIDER_SITE_OTHER): Payer: Medicare Other | Admitting: Podiatrist

## 2014-05-06 DIAGNOSIS — B351 Tinea unguium: Secondary | ICD-10-CM

## 2014-05-06 DIAGNOSIS — M79609 Pain in unspecified limb: Secondary | ICD-10-CM

## 2014-05-06 DIAGNOSIS — Q828 Other specified congenital malformations of skin: Secondary | ICD-10-CM

## 2014-05-06 DIAGNOSIS — M216X9 Other acquired deformities of unspecified foot: Secondary | ICD-10-CM

## 2014-05-06 DIAGNOSIS — M79676 Pain in unspecified toe(s): Secondary | ICD-10-CM

## 2014-05-06 DIAGNOSIS — I739 Peripheral vascular disease, unspecified: Secondary | ICD-10-CM

## 2014-05-13 ENCOUNTER — Other Ambulatory Visit: Payer: Self-pay | Admitting: Podiatrist

## 2014-05-13 ENCOUNTER — Other Ambulatory Visit: Payer: Self-pay | Admitting: Cardiology

## 2014-05-14 ENCOUNTER — Other Ambulatory Visit: Payer: Self-pay | Admitting: Podiatrist

## 2014-05-14 MED ORDER — L-METHYLFOLATE-B6-B12 3-35-2 MG PO TABS: ORAL_TABLET | ORAL | Status: DC

## 2014-05-15 ENCOUNTER — Other Ambulatory Visit: Payer: Self-pay

## 2014-05-15 MED ORDER — CLONAZEPAM 0.5 MG PO TABS: ORAL_TABLET | ORAL | Status: DC

## 2014-05-15 NOTE — Telephone Encounter (Signed)
Clonazepam has been called to pharmacy  

## 2014-05-15 NOTE — Telephone Encounter (Signed)
5.1.15 office visit 8.20.15 med last filled

## 2014-05-15 NOTE — Telephone Encounter (Signed)
OK; prn only  Must last until  06/14/14

## 2014-05-18 NOTE — Progress Notes (Signed)
Chief Complaint   Patient presents with   .  Callouses     callus trim''   .  Nail Problem     toenails trim.   HPI: Patient is 80 y.o. male who presents today for continued foot care bilateral. Patient had an ulceration submetatarsal 5 of the right foot which has gone on to heal with the use of an offloading shoe and wound care. He also had a sore on his heel which she also states feels better too. Patient's toenails are elongated and symptomatic and uncomfortable with ambulation and shoe gear. He comes in today with his daughter and caregiver  Physical Exam  GENERAL APPEARANCE: Alert, conversant. Appropriately groomed. Seen today in his wheelchair.  VASCULAR: Pedal pulses palpable decreased and faint bilateral. Capillary refill time increased to all digits, Proximal to distal cooling it warm to warm. Multiple varicosities are noted bilateral.  NEUROLOGIC: sensation is intact epicritically and protectively to 5.07 monofilament at 3/5 sites bilateral. Light touch is intact bilateral,  MUSCULOSKELETAL: Significant deformity present. Large exostoses present fifth metatarsals bilaterally. These are sites for ulceration and breakdown.  DERMATOLOGIC: Keratotic lesions present bilateral fifth metatarsals. Healed ulceration right fifth metatarsal is seen. Patient's toenails are elongated, thickened, dystrophic, symptomatic and mycotic.  Assessment: Pre-ulcerative lesions bilateral feet fifth metatarsals at the bony prominences, symptomatic onychomycosis  Plan: Debrided the pre-ulcerative lesions down to healthy intact skin. Debrided the toenails without complication as well. He will be seen back for routine care and prevention of ulceration. He is taking the metanx and states it is helping his foot pain.  Will refill the medication per his request.

## 2014-05-20 ENCOUNTER — Encounter (HOSPITAL_COMMUNITY): Payer: Self-pay | Admitting: Emergency Medicine

## 2014-05-20 ENCOUNTER — Emergency Department (HOSPITAL_COMMUNITY)
Admission: EM | Admit: 2014-05-20 | Discharge: 2014-05-20 | Disposition: A | Discharge status: Home / Self Care | Payer: Medicare Other | Attending: Emergency Medicine | Admitting: Emergency Medicine

## 2014-05-20 DIAGNOSIS — IMO0002 Reserved for concepts with insufficient information to code with codable children: Secondary | ICD-10-CM | POA: Insufficient documentation

## 2014-05-20 DIAGNOSIS — I10 Essential (primary) hypertension: Secondary | ICD-10-CM | POA: Diagnosis not present

## 2014-05-20 DIAGNOSIS — E785 Hyperlipidemia, unspecified: Secondary | ICD-10-CM | POA: Diagnosis not present

## 2014-05-20 DIAGNOSIS — Z87891 Personal history of nicotine dependence: Secondary | ICD-10-CM | POA: Diagnosis not present

## 2014-05-20 DIAGNOSIS — Q619 Cystic kidney disease, unspecified: Secondary | ICD-10-CM | POA: Insufficient documentation

## 2014-05-20 DIAGNOSIS — Y846 Urinary catheterization as the cause of abnormal reaction of the patient, or of later complication, without mention of misadventure at the time of the procedure: Secondary | ICD-10-CM | POA: Diagnosis not present

## 2014-05-20 DIAGNOSIS — Z8589 Personal history of malignant neoplasm of other organs and systems: Secondary | ICD-10-CM | POA: Insufficient documentation

## 2014-05-20 DIAGNOSIS — Y9289 Other specified places as the place of occurrence of the external cause: Secondary | ICD-10-CM | POA: Diagnosis not present

## 2014-05-20 DIAGNOSIS — I2510 Atherosclerotic heart disease of native coronary artery without angina pectoris: Secondary | ICD-10-CM | POA: Diagnosis not present

## 2014-05-20 DIAGNOSIS — N390 Urinary tract infection, site not specified: Secondary | ICD-10-CM | POA: Diagnosis not present

## 2014-05-20 DIAGNOSIS — Z86711 Personal history of pulmonary embolism: Secondary | ICD-10-CM | POA: Insufficient documentation

## 2014-05-20 DIAGNOSIS — R296 Repeated falls: Secondary | ICD-10-CM | POA: Diagnosis not present

## 2014-05-20 DIAGNOSIS — Z7901 Long term (current) use of anticoagulants: Secondary | ICD-10-CM | POA: Diagnosis not present

## 2014-05-20 DIAGNOSIS — I5022 Chronic systolic (congestive) heart failure: Secondary | ICD-10-CM | POA: Insufficient documentation

## 2014-05-20 DIAGNOSIS — N289 Disorder of kidney and ureter, unspecified: Secondary | ICD-10-CM | POA: Diagnosis not present

## 2014-05-20 DIAGNOSIS — D649 Anemia, unspecified: Secondary | ICD-10-CM | POA: Insufficient documentation

## 2014-05-20 DIAGNOSIS — I4891 Unspecified atrial fibrillation: Secondary | ICD-10-CM | POA: Insufficient documentation

## 2014-05-20 DIAGNOSIS — T83091A Other mechanical complication of indwelling urethral catheter, initial encounter: Secondary | ICD-10-CM | POA: Diagnosis present

## 2014-05-20 DIAGNOSIS — F410 Panic disorder [episodic paroxysmal anxiety] without agoraphobia: Secondary | ICD-10-CM | POA: Diagnosis not present

## 2014-05-20 DIAGNOSIS — Y9389 Activity, other specified: Secondary | ICD-10-CM | POA: Insufficient documentation

## 2014-05-20 DIAGNOSIS — Z860100 Personal history of colon polyps, unspecified: Secondary | ICD-10-CM | POA: Insufficient documentation

## 2014-05-20 DIAGNOSIS — Z79899 Other long term (current) drug therapy: Secondary | ICD-10-CM | POA: Diagnosis not present

## 2014-05-20 LAB — CBC WITH DIFFERENTIAL/PLATELET
Basophils Absolute: 0 10*3/uL (ref 0.0–0.1)
Basophils Relative: 0 % (ref 0–1)
EOS ABS: 0 10*3/uL (ref 0.0–0.7)
Eosinophils Relative: 0 % (ref 0–5)
HCT: 34.7 % — ABNORMAL LOW (ref 39.0–52.0)
HEMOGLOBIN: 11.1 g/dL — AB (ref 13.0–17.0)
Lymphocytes Relative: 4 % — ABNORMAL LOW (ref 12–46)
Lymphs Abs: 0.7 10*3/uL (ref 0.7–4.0)
MCH: 29.1 pg (ref 26.0–34.0)
MCHC: 32 g/dL (ref 30.0–36.0)
MCV: 91.1 fL (ref 78.0–100.0)
MONOS PCT: 12 % (ref 3–12)
Monocytes Absolute: 2.1 10*3/uL — ABNORMAL HIGH (ref 0.1–1.0)
NEUTROS PCT: 84 % — AB (ref 43–77)
Neutro Abs: 15 10*3/uL — ABNORMAL HIGH (ref 1.7–7.7)
PLATELETS: 225 10*3/uL (ref 150–400)
RBC: 3.81 MIL/uL — AB (ref 4.22–5.81)
RDW: 15 % (ref 11.5–15.5)
WBC: 17.8 10*3/uL — ABNORMAL HIGH (ref 4.0–10.5)

## 2014-05-20 LAB — I-STAT CHEM 8, ED
BUN: 43 mg/dL — ABNORMAL HIGH (ref 6–23)
CREATININE: 2.2 mg/dL — AB (ref 0.50–1.35)
Calcium, Ion: 1.21 mmol/L (ref 1.13–1.30)
Chloride: 105 mEq/L (ref 96–112)
GLUCOSE: 112 mg/dL — AB (ref 70–99)
HCT: 36 % — ABNORMAL LOW (ref 39.0–52.0)
Hemoglobin: 12.2 g/dL — ABNORMAL LOW (ref 13.0–17.0)
POTASSIUM: 4.3 meq/L (ref 3.7–5.3)
Sodium: 138 mEq/L (ref 137–147)
TCO2: 20 mmol/L (ref 0–100)

## 2014-05-20 LAB — URINALYSIS, ROUTINE W REFLEX MICROSCOPIC
BILIRUBIN URINE: NEGATIVE
Glucose, UA: NEGATIVE mg/dL
Ketones, ur: NEGATIVE mg/dL
NITRITE: NEGATIVE
PH: 7 (ref 5.0–8.0)
Protein, ur: 30 mg/dL — AB
SPECIFIC GRAVITY, URINE: 1.01 (ref 1.005–1.030)
Urobilinogen, UA: 1 mg/dL (ref 0.0–1.0)

## 2014-05-20 LAB — PROTIME-INR
INR: 2.3 — AB (ref 0.00–1.49)
PROTHROMBIN TIME: 25.3 s — AB (ref 11.6–15.2)

## 2014-05-20 LAB — CK: Total CK: 589 U/L — ABNORMAL HIGH (ref 7–232)

## 2014-05-20 LAB — URINE MICROSCOPIC-ADD ON

## 2014-05-20 MED ORDER — CEPHALEXIN 500 MG PO CAPS: 500.0000 mg | ORAL_CAPSULE | Freq: Two times a day (BID) | ORAL | Status: DC

## 2014-05-20 NOTE — ED Notes (Signed)
Pt states he needed his urinary catheter irrigated and needed to go use the restroom in the middle of the night. Pt states pt was returning from the restroom when he had difficulty laying in the bed. Pt reports sliding from the edge of the bed to the floor. Pt denies "falling" from the bed. Pt denies injury from this incident. No bleeding, bruising, or swelling noted at this time. Pt is neurologically intact and reports pain in his abdomen and bladder. Will continue to monitor. Awaiting orders from provider.

## 2014-05-20 NOTE — ED Notes (Signed)
Bed: WA21 Expected date:  Expected time:  Means of arrival:  Comments: EMS/catheter issues

## 2014-05-20 NOTE — Progress Notes (Signed)
  CARE MANAGEMENT ED NOTE 05/20/2014  Patient:  [name], [name]   Account Number:  [id number]  Date Initiated:  05/20/2014  Documentation initiated by:  Jackelyn Poling  Subjective/Objective Assessment:   80 yr old united health care [facility]) medicare complete [place] pt Reports sliding to floor at home found by staff working for him     Subjective/Objective Assessment Detail:   [place] NOTES  Aide with pt IS NOT from a home health agency  The male at pt bedside informed CM she does not work for an Chief Strategy Officer but was hired by the pt and family to provide care    pcp Dr hopper     Action/Plan:   Confirmed pt does not have home health services but a Magazine features editor   Action/Plan Detail:   Anticipated DC Date:       Status Recommendation to Physician:   Result of Recommendation:    Other ED Services  Consult Working [place]  Other  Outpatient Services - Pt will follow up    Choice offered to / List presented to:            Status of service:  Completed, signed off  ED Comments:   ED Comments Detail:

## 2014-05-20 NOTE — ED Provider Notes (Signed)
CSN: [id number]     Arrival date & time 05/20/14  1142 History   First MD Initiated Contact with Patient 05/20/14 1248     Chief Complaint  Patient presents with  . Fall  . Urethral Catheter Problems      (Consider location/radiation/quality/duration/timing/severity/associated sxs/prior Treatment) Patient is a 80 y.o. male presenting with fall. The history is provided by the patient.  Fall Associated symptoms include abdominal pain. Pertinent negatives include no chest pain, no headaches and no shortness of breath.   patient presents after a "fall". He states he got to the bathroom and last night and is not strong enough to get his feet back in bed. He states his wife tried to help him, but she is not strong enough to help with them. He states that he eventually tired and sat down onto his knees on the ground. States he stayed down there until this morning. He states he began to develop lower abdominal pain. He states that his Foley catheter was not draining. He states he did not hit his head. He has mild pain in his knees. His abdominal pain has improved after Foley catheter has been replaced. Initially found to have 600 cc of urine with the catheter. No fevers. No nausea vomiting. No back pain.  Past Medical History  Diagnosis Date  . Anemia     nos  . History of colonic polyps   . Hyperlipidemia   . Hypertension   . Cancer [phone]    hx of basal cell  . BPH (benign prostatic hyperplasia)   . Anxiety attack     panic   . Pre-syncope     echo 03/2010,with EF 60%,mild RV dilation,no significant abnormalities...event monitor for 3wks/episodes os sinus bradycardia with heart rate to low 40's occasionally at night(suspect while sleeping)  . Atrial fibrillation     a. Dx 11/2012 - TEE with possible LAA thrombus, started on amio/coumadin.  . Systolic CHF     a. EF 99-24% 11/2012 ?tachy-mediated. b. Not on ACEI at dc due to AKI, can consider as OP.  [name] CAD (coronary artery disease)     a.  Mod CAD by cath 12/10/12 - 60% long proximal LAD, 60% prox PDA.  [name] AKI (acute kidney injury)     a. Peak Cr 4.17 11/2012 - felt to be post-obstructive.  . Urinary retention     a. Post obstructive, dc'd with foley 12/2012.  [name] Renal cyst     a. By renal US 11/2012, not candidate for further eval.  . Thrombus of left atrial appendage     a. Possible LAA thrombus by TEE 11/2012.   Past Surgical History  Procedure Laterality Date  . Colonoscopy w/ polypectomy  2007  . Cholecystectomy    . Total knee arthroplasty    . Mohs surgery  2010  . Tee without cardioversion N/A 12/11/2012    Procedure: TRANSESOPHAGEAL ECHOCARDIOGRAM (TEE);  Surgeon: Thayer Headings, MD;  Location: [facility];  Service: Cardiovascular;  Laterality: N/A;  ja/bev.  Darden Dates without cardioversion N/A 01/21/2013    Procedure: TRANSESOPHAGEAL ECHOCARDIOGRAM (TEE);  Surgeon: Larey Dresser, MD;  Location: [facility];  Service: Cardiovascular;  Laterality: N/A;  . Cardioversion N/A 01/21/2013    Procedure: CARDIOVERSION;  Surgeon: Larey Dresser, MD;  Location: [facility] ENDOSCOPY;  Service: Cardiovascular;  Laterality: N/A;   Family History  Problem Relation Age of Onset  . Heart attack Father   . Heart failure Father 54    S/P  Turp  . Heart disease Father 60    MI.Marland KitchenMarland KitchenCHF S/P TURP @ 80  . Stroke Brother 79  . Coronary artery disease Brother   . Lung cancer Brother    History  Substance Use Topics  . Smoking status: Former Smoker    Quit date: 08/22/1983  . Smokeless tobacco: Not on file  . Alcohol Use: No    Review of Systems  Constitutional: Negative for fever and chills.  Respiratory: Negative for shortness of breath.   Cardiovascular: Negative for chest pain.  Gastrointestinal: Positive for abdominal pain. Negative for vomiting and diarrhea.  Genitourinary: Positive for difficulty urinating.  Musculoskeletal: Positive for back pain. Negative for neck pain.  Skin: Positive for wound.  Neurological: Negative for  headaches.  Psychiatric/Behavioral: Negative for confusion.      Allergies  Darifenacin hydrobromide and Fluoxetine hcl  Home Medications   Prior to Admission medications   Medication Sig Start Date End Date Taking? Authorizing Provider  acetaminophen (TYLENOL) 325 MG tablet Take 650 mg by mouth every 6 (six) hours as needed for moderate pain (pain).    Yes Historical Provider, MD  amiodarone (PACERONE) 200 MG tablet Take 100 mg by mouth daily.   Yes Historical Provider, MD  buPROPion (WELLBUTRIN) 75 MG tablet Take 75 mg by mouth 2 (two) times daily.   Yes Historical Provider, MD  clonazePAM (KLONOPIN) 0.5 MG tablet Take 0.5-15 mg by mouth 2 (two) times daily as needed for anxiety (anxiety). Take 1 tablet in the am & take 2 tablets in the pm   Yes Historical Provider, MD  finasteride (PROSCAR) 5 MG tablet Take 1 tablet (5 mg total) by mouth daily. 12/20/12  Yes Dayna N Dunn, PA-C  furosemide (LASIX) 40 MG tablet Take 1 tablet (40 mg total) by mouth every other day. 04/17/14  Yes Larey Dresser, MD  l-methylfolate-B6-B12 [facility]) 3-35-2 MG TABS Take 1 tablet by mouth 2 (two) times daily.   Yes Historical Provider, MD  lisinopril (PRINIVIL,ZESTRIL) 5 MG tablet Take 2.5 mg by mouth daily.  04/17/14  Yes Larey Dresser, MD  Meth-Hyo-M Barnett Hatter Phos-Ph Sal (URIBEL PO) Take 1 capsule by mouth daily as needed (bladder spasm).    Yes Historical Provider, MD  Multiple Vitamin (MULTIVITAMIN) tablet Take 1 tablet by mouth daily.   Yes Historical Provider, MD  phenazopyridine (PYRIDIUM) 100 MG tablet Take 100 mg by mouth 3 (three) times daily as needed for pain (bladder spasms).    Yes Historical Provider, MD  pravastatin (PRAVACHOL) 80 MG tablet Take 80 mg by mouth daily.   Yes Historical Provider, MD  Probiotic Product (ALIGN) 4 MG CAPS Take 1 capsule by mouth daily.    Yes Historical Provider, MD  warfarin (COUMADIN) 2.5 MG tablet Take 2.5-3.75 mg by mouth daily.   Yes Historical Provider, MD   cephALEXin (KEFLEX) 500 MG capsule Take 1 capsule (500 mg total) by mouth 2 (two) times daily. 05/20/14   Jasper Riling. Almir Botts, MD   BP 115/61  Pulse 71  Temp(Src) 97.8 F (36.6 C) (Oral)  Resp 16  SpO2 98% Physical Exam  Constitutional: He is oriented to person, place, and time. He appears well-developed.  HENT:  Head: Normocephalic.  Eyes: Pupils are equal, round, and reactive to light.  Neck: Neck supple.  Cardiovascular: Normal rate and regular rhythm.   Pulmonary/Chest: Effort normal.  Abdominal: Soft. He exhibits distension.  Mild abdominal distention without rebound or guarding. No hernias palpated  Musculoskeletal:  Superficial abrasions to bilateral  knees. Left knee in brace.   Neurological: He is alert and oriented to person, place, and time.  Skin: Skin is warm.    ED Course  Procedures (including critical care time) Labs Review Labs Reviewed  URINALYSIS, ROUTINE W REFLEX MICROSCOPIC - Abnormal; Notable for the following:    Color, Urine GREEN (*)    APPearance CLOUDY (*)    Hgb urine dipstick LARGE (*)    Protein, ur 30 (*)    Leukocytes, UA LARGE (*)    All other components within normal limits  CBC WITH DIFFERENTIAL - Abnormal; Notable for the following:    WBC 17.8 (*)    RBC 3.81 (*)    Hemoglobin 11.1 (*)    HCT 34.7 (*)    Neutrophils Relative % 84 (*)    Neutro Abs 15.0 (*)    Lymphocytes Relative 4 (*)    Monocytes Absolute 2.1 (*)    All other components within normal limits  CK - Abnormal; Notable for the following:    Total CK 589 (*)    All other components within normal limits  PROTIME-INR - Abnormal; Notable for the following:    Prothrombin Time 25.3 (*)    INR 2.30 (*)    All other components within normal limits  I-STAT CHEM 8, ED - Abnormal; Notable for the following:    BUN 43 (*)    Creatinine, Ser 2.20 (*)    Glucose, Bld 112 (*)    Hemoglobin 12.2 (*)    HCT 36.0 (*)    All other components within normal limits  URINE  CULTURE  URINE MICROSCOPIC-ADD ON    Imaging Review No results found.   EKG Interpretation None      MDM   Final diagnoses:  Obstructed Foley catheter, initial encounter  Renal insufficiency  Lower urinary tract infection    Patient with some time on the floor after gently being lowered. Abrasion to knees. His Foley was obstructed and has improved with replacement. Creatinine has increased to 2.2 from a more normal baseline. As a possible urinary tract infection. White count is elevated. Will treat with antibiotics and cultures have sent. No apparent severe injury. Just abrasions on knees. Will need short-term followup for following of the creatinine.    Jasper Riling. Alvino Chapel, MD 05/20/14 1537

## 2014-05-20 NOTE — ED Notes (Signed)
Pt reports returning from the restroom when he was having difficulty getting back into bed. Pt reports slid down onto the floor to prevent a fall. Pt reports laying on the ground for a couple hours. Pt denies injury from this incident. Pt states his pain is currently on his abdomen, bladder, and penis. Pt states "feels like my bladder is very full." Pt states the nausea medication has been effective.

## 2014-05-20 NOTE — ED Notes (Signed)
Urethral catheter irrigated. Wife and caregiver given discharge instructions. Everyone acknowledges understanding of AVS and to take antibiotics-full regimen. No other questions/concerns. Catheter bag date/timed/initialed. Still draining urine. All belongings sent home with family. Patient A&Ox4. Moving all extremities.

## 2014-05-20 NOTE — ED Notes (Signed)
Per EMS-Found on the ground by home health aide. Did not hit his head. A&Ox4. Moving all extremities equally. No deformities noted. On Coumadin. Reports getting up to go to the bathroom around 0200 am. Couldn't get into the bed. Reports sliding himself to the ground and laid down on the ground until getting there. Small laceration to left forearm. Reports neck feeling "stiff." Passed [facility]. Burning sensation in penis, has been unable to void recently. Usually has 2000cc output in the morning. Last night, home health aide reportedly did not irrigate catheter. Has not been able to void since then. C/o bladder pain. VS: BP 116/62 HR 84 SpO2 94% on RA. CBG 185. 20G PIV in left hand. Denies dizziness. 4mg  Zofran given. No vomiting noted, only nausea.

## 2014-05-20 NOTE — Discharge Instructions (Signed)

## 2014-05-23 LAB — URINE CULTURE: Colony Count: >=100000

## 2014-05-25 ENCOUNTER — Telehealth (HOSPITAL_COMMUNITY): Payer: Self-pay

## 2014-05-25 NOTE — ED Notes (Unsigned)
Post ED Visit - Positive Culture Follow-up: Successful Patient Follow-Up  Culture assessed and recommendations reviewed by: []  Wes [place], Pharm.D., BCPS []  Heide Guile, Pharm.D., BCPS []  Alycia Rossetti, Pharm.D., BCPS []  [place], [place].D., BCPS, AAHIVP []  Legrand Como, Pharm.D., BCPS, AAHIVP []  Hassie Bruce, Pharm.D. []  Milus Glazier, Pharm.D.  Positive urine culture  []  Patient discharged without antimicrobial prescription and treatment is now indicated [x]  Organism is resistant to prescribed ED discharge antimicrobial []  Patient with positive blood cultures  Changes discussed with ED provider: Antonieta Pert New antibiotic prescription stop cephalexin, cipro 250 po bid x 7 days. [phone] left on voicemail.   Contacted patient, date 05/25/2014, time 1108   Ileene Musa 05/25/2014, 11:06 AM

## 2014-05-25 NOTE — Progress Notes (Signed)
ED Antimicrobial Stewardship Positive Culture Follow Up   [name] is an 80 y.o. male who presented to [facility] on 05/20/2014 with a chief complaint of  Chief Complaint  Patient presents with  . Fall  . Urethral Catheter Problems     Recent Results (from the past 720 hour(s))  URINE CULTURE     Status: None   Collection Time    05/20/14  2:21 PM      Result Value Ref Range Status   Specimen Description URINE, CATHETERIZED   Final   Special Requests NONE   Final   Culture  Setup Time     Final   Value: 05/21/2014 01:25     Performed at [facility]     Final   Value: >=100,000 COLONIES/ML     Performed at [facility]   Culture     Final   Value: ENTEROBACTER AEROGENES     Performed at [facility]   Report Status 05/23/2014 FINAL   Final   Organism ID, Bacteria ENTEROBACTER AEROGENES   Final    [x]  Treated with cephalexin, organism resistant to prescribed antimicrobial []  Patient discharged originally without antimicrobial agent and treatment is now indicated  New antibiotic prescription: ciprofloxacin 250mg  po BID x 7 days.  Will inform warfarin clinic of the antibiotic prescription.  Next INR check is October 9.  ED Provider: Irena Cords, PA-C   [name] 05/25/2014, 9:33 AM Infectious Diseases Pharmacist Phone# [phone]

## 2014-06-23 ENCOUNTER — Other Ambulatory Visit: Payer: Self-pay | Admitting: Cardiology

## 2014-06-23 ENCOUNTER — Other Ambulatory Visit: Payer: Self-pay

## 2014-06-23 NOTE — Telephone Encounter (Signed)
#  30 PRN only , do not take routinely

## 2014-06-23 NOTE — Telephone Encounter (Signed)
Are these the correct directions?

## 2014-06-23 NOTE — Telephone Encounter (Deleted)
[facility]  

## 2014-06-24 ENCOUNTER — Other Ambulatory Visit: Payer: Self-pay | Admitting: Internal Medicine

## 2014-06-24 MED ORDER — CLONAZEPAM 0.5 MG PO TABS: ORAL_TABLET | ORAL | Status: DC

## 2014-06-24 NOTE — Telephone Encounter (Signed)
   Because of history of falls, most recently 05/20/14; this medication should be taken at the lowest possible dose and infrequently as possible. It can significantly increase the risk of falling, particularly if taken at night.

## 2014-06-24 NOTE — Telephone Encounter (Signed)
Need clarification on exact directions

## 2014-06-24 NOTE — Telephone Encounter (Signed)
Clonazepam called to pharmacy  

## 2014-06-29 ENCOUNTER — Other Ambulatory Visit: Payer: Self-pay | Admitting: Cardiology

## 2014-07-05 ENCOUNTER — Other Ambulatory Visit: Payer: Self-pay | Admitting: Cardiology

## 2014-07-06 ENCOUNTER — Other Ambulatory Visit: Payer: Self-pay | Admitting: Cardiology

## 2014-07-06 ENCOUNTER — Other Ambulatory Visit: Payer: Self-pay

## 2014-07-06 ENCOUNTER — Encounter: Payer: Self-pay | Admitting: Internal Medicine

## 2014-07-06 DIAGNOSIS — Z9181 History of falling: Secondary | ICD-10-CM | POA: Insufficient documentation

## 2014-07-06 NOTE — Telephone Encounter (Signed)
#  30  Take as infrequently as possible due to increased fall risk

## 2014-07-07 MED ORDER — CLONAZEPAM 0.5 MG PO TABS: ORAL_TABLET | ORAL | Status: DC

## 2014-07-07 NOTE — Telephone Encounter (Signed)
[facility] 

## 2014-07-13 ENCOUNTER — Telehealth: Payer: Self-pay | Admitting: Cardiology

## 2014-07-13 NOTE — Telephone Encounter (Signed)
Telephoned [name] caregiver by about home health and INR checks. She states that Dr Tammi Klippel has recently given an order for Catheter Care with [facility]. I telephoned [facility] and they received order today and not sure when Clinical manager will assign visit. Thus, told Caregiver to please let us know when they make first visit and we can give order to check INR while they are in home providing other care.

## 2014-07-13 NOTE — Telephone Encounter (Signed)
New Msg   Patients caregiver calling to get home health nurse order. Caregiver is Roselee Nova please contact at [phone] or [phone].

## 2014-07-28 ENCOUNTER — Other Ambulatory Visit: Payer: Self-pay

## 2014-07-28 ENCOUNTER — Other Ambulatory Visit: Payer: Self-pay | Admitting: Cardiology

## 2014-07-28 MED ORDER — CLONAZEPAM 0.5 MG PO TABS: ORAL_TABLET | ORAL | Status: DC

## 2014-07-28 NOTE — Telephone Encounter (Signed)
OK with same warning

## 2014-07-28 NOTE — Telephone Encounter (Signed)
[facility]  

## 2014-07-29 LAB — POCT INR: INR: 2.3

## 2014-07-30 ENCOUNTER — Telehealth: Payer: Self-pay | Admitting: Cardiology

## 2014-07-30 ENCOUNTER — Ambulatory Visit (INDEPENDENT_AMBULATORY_CARE_PROVIDER_SITE_OTHER): Payer: Medicare Other | Admitting: Pharmacist Clinician (PhC)/ Clinical Pharmacy Specialist

## 2014-07-30 ENCOUNTER — Encounter (HOSPITAL_COMMUNITY): Payer: Self-pay | Admitting: Cardiology

## 2014-07-30 DIAGNOSIS — I4891 Unspecified atrial fibrillation: Secondary | ICD-10-CM

## 2014-07-30 NOTE — Telephone Encounter (Signed)
Error

## 2014-07-31 NOTE — Telephone Encounter (Signed)
Spoke with Merry Proud at [facility] who is calling to let Dr. Aundra Dubin know they will start seeing pt for catheter changes. Will forward to Dr. Aundra Dubin.

## 2014-07-31 NOTE — Telephone Encounter (Signed)
New message     Talk to the nurse.  Pt is being admitted to [facility].

## 2014-08-02 ENCOUNTER — Other Ambulatory Visit: Payer: Self-pay | Admitting: Cardiology

## 2014-08-02 ENCOUNTER — Other Ambulatory Visit: Payer: Self-pay | Admitting: Internal Medicine

## 2014-08-03 ENCOUNTER — Other Ambulatory Visit: Payer: Self-pay | Admitting: Internal Medicine

## 2014-08-03 NOTE — Telephone Encounter (Signed)
OK #60 

## 2014-08-04 ENCOUNTER — Other Ambulatory Visit: Payer: Self-pay | Admitting: *Deleted

## 2014-08-04 MED ORDER — FUROSEMIDE 40 MG PO TABS: 40.0000 mg | ORAL_TABLET | ORAL | Status: DC

## 2014-08-04 MED ORDER — LISINOPRIL 5 MG PO TABS: 2.5000 mg | ORAL_TABLET | Freq: Every day | ORAL | Status: DC

## 2014-08-05 NOTE — Telephone Encounter (Signed)
Merry Proud advised Dr Aundra Dubin would not manage foley catheter. Merry Proud states that he called the wrong doctor about foley catheter management, he will contact the appropriate MD for foley catheter management.

## 2014-08-06 ENCOUNTER — Ambulatory Visit: Payer: Medicare Other | Admitting: Podiatrist

## 2014-08-17 ENCOUNTER — Other Ambulatory Visit: Payer: Self-pay

## 2014-08-17 NOTE — Telephone Encounter (Signed)
#  30 as written This medication is among those which experts have documented to have a very  high risk of affecting  mental  alertness  & balance. This results in increased risk of falling with serious health or life threatening injury. Such medication should be taken as infrequently as possible and @  the lowest possible dose.

## 2014-08-18 MED ORDER — CLONAZEPAM 0.5 MG PO TABS: ORAL_TABLET | ORAL | Status: DC

## 2014-08-18 NOTE — Telephone Encounter (Signed)
[phone]

## 2014-08-23 ENCOUNTER — Other Ambulatory Visit: Payer: Self-pay | Admitting: Internal Medicine

## 2014-08-23 ENCOUNTER — Other Ambulatory Visit: Payer: Self-pay | Admitting: Cardiology

## 2014-08-24 DIAGNOSIS — I509 Heart failure, unspecified: Secondary | ICD-10-CM | POA: Diagnosis not present

## 2014-08-24 DIAGNOSIS — I4891 Unspecified atrial fibrillation: Secondary | ICD-10-CM | POA: Diagnosis not present

## 2014-08-24 DIAGNOSIS — S80211D Abrasion, right knee, subsequent encounter: Secondary | ICD-10-CM | POA: Diagnosis not present

## 2014-08-24 DIAGNOSIS — R338 Other retention of urine: Secondary | ICD-10-CM | POA: Diagnosis not present

## 2014-08-24 DIAGNOSIS — S80212D Abrasion, left knee, subsequent encounter: Secondary | ICD-10-CM | POA: Diagnosis not present

## 2014-08-24 DIAGNOSIS — G629 Polyneuropathy, unspecified: Secondary | ICD-10-CM | POA: Diagnosis not present

## 2014-08-24 DIAGNOSIS — N401 Enlarged prostate with lower urinary tract symptoms: Secondary | ICD-10-CM | POA: Diagnosis not present

## 2014-08-24 DIAGNOSIS — Z466 Encounter for fitting and adjustment of urinary device: Secondary | ICD-10-CM | POA: Diagnosis not present

## 2014-08-24 DIAGNOSIS — I10 Essential (primary) hypertension: Secondary | ICD-10-CM | POA: Diagnosis not present

## 2014-08-24 DIAGNOSIS — N319 Neuromuscular dysfunction of bladder, unspecified: Secondary | ICD-10-CM | POA: Diagnosis not present

## 2014-08-24 NOTE — Telephone Encounter (Signed)
Ok X1 

## 2014-08-25 ENCOUNTER — Other Ambulatory Visit: Payer: Self-pay | Admitting: Cardiology

## 2014-09-01 ENCOUNTER — Telehealth: Payer: Self-pay

## 2014-09-01 NOTE — Telephone Encounter (Signed)
May refill 

## 2014-09-02 ENCOUNTER — Other Ambulatory Visit: Payer: Self-pay

## 2014-09-02 MED ORDER — AMIODARONE HCL 200 MG PO TABS: ORAL_TABLET | ORAL | Status: DC

## 2014-09-03 ENCOUNTER — Other Ambulatory Visit: Payer: Self-pay

## 2014-09-03 MED ORDER — CLONAZEPAM 0.5 MG PO TABS: ORAL_TABLET | ORAL | Status: DC

## 2014-09-03 NOTE — Telephone Encounter (Signed)
Clonazepam script has been faxed to [facility]

## 2014-09-03 NOTE — Telephone Encounter (Signed)
OK X1 This medication is among those which experts have documented to have a very  high risk of affecting  mental  alertness  & balance. This results in increased risk of falling with serious health or life threatening injury. Such medication should be taken as infrequently as possible and @  the lowest possible dose.

## 2014-09-04 ENCOUNTER — Ambulatory Visit (INDEPENDENT_AMBULATORY_CARE_PROVIDER_SITE_OTHER): Payer: Self-pay | Admitting: *Deleted

## 2014-09-04 DIAGNOSIS — G629 Polyneuropathy, unspecified: Secondary | ICD-10-CM | POA: Diagnosis not present

## 2014-09-04 DIAGNOSIS — I509 Heart failure, unspecified: Secondary | ICD-10-CM | POA: Diagnosis not present

## 2014-09-04 DIAGNOSIS — S80211D Abrasion, right knee, subsequent encounter: Secondary | ICD-10-CM | POA: Diagnosis not present

## 2014-09-04 DIAGNOSIS — R338 Other retention of urine: Secondary | ICD-10-CM | POA: Diagnosis not present

## 2014-09-04 DIAGNOSIS — N401 Enlarged prostate with lower urinary tract symptoms: Secondary | ICD-10-CM | POA: Diagnosis not present

## 2014-09-04 DIAGNOSIS — I4891 Unspecified atrial fibrillation: Secondary | ICD-10-CM | POA: Diagnosis not present

## 2014-09-04 DIAGNOSIS — S80212D Abrasion, left knee, subsequent encounter: Secondary | ICD-10-CM | POA: Diagnosis not present

## 2014-09-04 DIAGNOSIS — Z466 Encounter for fitting and adjustment of urinary device: Secondary | ICD-10-CM | POA: Diagnosis not present

## 2014-09-04 DIAGNOSIS — I10 Essential (primary) hypertension: Secondary | ICD-10-CM | POA: Diagnosis not present

## 2014-09-04 DIAGNOSIS — N319 Neuromuscular dysfunction of bladder, unspecified: Secondary | ICD-10-CM | POA: Diagnosis not present

## 2014-09-04 LAB — POCT INR: INR: 2.8

## 2014-09-06 ENCOUNTER — Other Ambulatory Visit: Payer: Self-pay | Admitting: Cardiology

## 2014-09-07 ENCOUNTER — Other Ambulatory Visit: Payer: Self-pay

## 2014-09-07 NOTE — Telephone Encounter (Signed)
Refused #30 written 1/14

## 2014-09-07 NOTE — Telephone Encounter (Signed)
Hopp,   Mr. Ratz has been taking the Clonazepam since around 2012.  It appears he has been taking between 30-90 tablets every 30-60 days.  His previous dose was 1 tablet in the AM and 2 in the evening.  It appears that it has been reduced to 1/2 tablet in the AM and 1/2 tablet in the PM.    Josh

## 2014-09-09 DIAGNOSIS — N401 Enlarged prostate with lower urinary tract symptoms: Secondary | ICD-10-CM | POA: Diagnosis not present

## 2014-09-09 DIAGNOSIS — S80211D Abrasion, right knee, subsequent encounter: Secondary | ICD-10-CM | POA: Diagnosis not present

## 2014-09-09 DIAGNOSIS — Z466 Encounter for fitting and adjustment of urinary device: Secondary | ICD-10-CM | POA: Diagnosis not present

## 2014-09-09 DIAGNOSIS — I10 Essential (primary) hypertension: Secondary | ICD-10-CM | POA: Diagnosis not present

## 2014-09-09 DIAGNOSIS — S80212D Abrasion, left knee, subsequent encounter: Secondary | ICD-10-CM | POA: Diagnosis not present

## 2014-09-09 DIAGNOSIS — R338 Other retention of urine: Secondary | ICD-10-CM | POA: Diagnosis not present

## 2014-09-09 DIAGNOSIS — I4891 Unspecified atrial fibrillation: Secondary | ICD-10-CM | POA: Diagnosis not present

## 2014-09-09 DIAGNOSIS — G629 Polyneuropathy, unspecified: Secondary | ICD-10-CM | POA: Diagnosis not present

## 2014-09-09 DIAGNOSIS — I509 Heart failure, unspecified: Secondary | ICD-10-CM | POA: Diagnosis not present

## 2014-09-09 DIAGNOSIS — N319 Neuromuscular dysfunction of bladder, unspecified: Secondary | ICD-10-CM | POA: Diagnosis not present

## 2014-09-23 ENCOUNTER — Other Ambulatory Visit: Payer: Self-pay | Admitting: Cardiology

## 2014-09-24 ENCOUNTER — Other Ambulatory Visit: Payer: Self-pay

## 2014-09-24 DIAGNOSIS — R338 Other retention of urine: Secondary | ICD-10-CM | POA: Diagnosis not present

## 2014-09-24 DIAGNOSIS — S80211D Abrasion, right knee, subsequent encounter: Secondary | ICD-10-CM | POA: Diagnosis not present

## 2014-09-24 DIAGNOSIS — I10 Essential (primary) hypertension: Secondary | ICD-10-CM | POA: Diagnosis not present

## 2014-09-24 DIAGNOSIS — N319 Neuromuscular dysfunction of bladder, unspecified: Secondary | ICD-10-CM | POA: Diagnosis not present

## 2014-09-24 DIAGNOSIS — I509 Heart failure, unspecified: Secondary | ICD-10-CM | POA: Diagnosis not present

## 2014-09-24 DIAGNOSIS — Z466 Encounter for fitting and adjustment of urinary device: Secondary | ICD-10-CM | POA: Diagnosis not present

## 2014-09-24 DIAGNOSIS — S80212D Abrasion, left knee, subsequent encounter: Secondary | ICD-10-CM | POA: Diagnosis not present

## 2014-09-24 DIAGNOSIS — G629 Polyneuropathy, unspecified: Secondary | ICD-10-CM | POA: Diagnosis not present

## 2014-09-24 DIAGNOSIS — N401 Enlarged prostate with lower urinary tract symptoms: Secondary | ICD-10-CM | POA: Diagnosis not present

## 2014-09-24 DIAGNOSIS — I4891 Unspecified atrial fibrillation: Secondary | ICD-10-CM | POA: Diagnosis not present

## 2014-09-24 MED ORDER — CLONAZEPAM 0.5 MG PO TABS: ORAL_TABLET | ORAL | Status: DC

## 2014-09-24 NOTE — Telephone Encounter (Signed)
[facility] [phone]

## 2014-09-24 NOTE — Telephone Encounter (Signed)
#  30 1/2 bid prn only

## 2014-09-25 ENCOUNTER — Telehealth: Payer: Self-pay | Admitting: Cardiology

## 2014-09-25 NOTE — Telephone Encounter (Signed)
LMTCB ---Will route to primary covering nurse and Dr Aundra Dubin for further review, recommendation and follow-up.

## 2014-09-25 NOTE — Telephone Encounter (Signed)
New message        Pt needs orders to be re certified with [place]. Please give scott a call.

## 2014-09-25 NOTE — Telephone Encounter (Signed)
Follow Up  Nicki Reaper returning [facility] phone call. Please call back and discus.

## 2014-09-25 NOTE — Telephone Encounter (Signed)
Left detailed message for Scott at [facility] to call back in regards to pts [place] orders.

## 2014-09-25 NOTE — Telephone Encounter (Signed)
Not sure what this message means.  I am fine with renewing his home health orders.

## 2014-09-28 DIAGNOSIS — N401 Enlarged prostate with lower urinary tract symptoms: Secondary | ICD-10-CM | POA: Diagnosis not present

## 2014-09-28 DIAGNOSIS — I4891 Unspecified atrial fibrillation: Secondary | ICD-10-CM | POA: Diagnosis not present

## 2014-09-28 DIAGNOSIS — I509 Heart failure, unspecified: Secondary | ICD-10-CM | POA: Diagnosis not present

## 2014-09-28 DIAGNOSIS — R338 Other retention of urine: Secondary | ICD-10-CM | POA: Diagnosis not present

## 2014-09-28 DIAGNOSIS — S80212S Abrasion, left knee, sequela: Secondary | ICD-10-CM | POA: Diagnosis not present

## 2014-09-28 DIAGNOSIS — G629 Polyneuropathy, unspecified: Secondary | ICD-10-CM | POA: Diagnosis not present

## 2014-09-28 DIAGNOSIS — S80211D Abrasion, right knee, subsequent encounter: Secondary | ICD-10-CM | POA: Diagnosis not present

## 2014-09-28 DIAGNOSIS — I10 Essential (primary) hypertension: Secondary | ICD-10-CM | POA: Diagnosis not present

## 2014-09-28 DIAGNOSIS — N319 Neuromuscular dysfunction of bladder, unspecified: Secondary | ICD-10-CM | POA: Diagnosis not present

## 2014-09-28 DIAGNOSIS — Z466 Encounter for fitting and adjustment of urinary device: Secondary | ICD-10-CM | POA: Diagnosis not present

## 2014-09-28 NOTE — Telephone Encounter (Signed)
Scott with [facility] given OK to continue to go to home to do PTs/INRs.

## 2014-10-01 ENCOUNTER — Other Ambulatory Visit: Payer: Self-pay | Admitting: Internal Medicine

## 2014-10-01 NOTE — Telephone Encounter (Signed)
OK #60 

## 2014-10-02 ENCOUNTER — Ambulatory Visit (INDEPENDENT_AMBULATORY_CARE_PROVIDER_SITE_OTHER): Payer: Self-pay | Admitting: Internal Medicine

## 2014-10-02 DIAGNOSIS — I509 Heart failure, unspecified: Secondary | ICD-10-CM | POA: Diagnosis not present

## 2014-10-02 DIAGNOSIS — R338 Other retention of urine: Secondary | ICD-10-CM | POA: Diagnosis not present

## 2014-10-02 DIAGNOSIS — I10 Essential (primary) hypertension: Secondary | ICD-10-CM | POA: Diagnosis not present

## 2014-10-02 DIAGNOSIS — I4891 Unspecified atrial fibrillation: Secondary | ICD-10-CM

## 2014-10-02 DIAGNOSIS — N401 Enlarged prostate with lower urinary tract symptoms: Secondary | ICD-10-CM | POA: Diagnosis not present

## 2014-10-02 DIAGNOSIS — G629 Polyneuropathy, unspecified: Secondary | ICD-10-CM | POA: Diagnosis not present

## 2014-10-02 DIAGNOSIS — S80211D Abrasion, right knee, subsequent encounter: Secondary | ICD-10-CM | POA: Diagnosis not present

## 2014-10-02 DIAGNOSIS — Z466 Encounter for fitting and adjustment of urinary device: Secondary | ICD-10-CM | POA: Diagnosis not present

## 2014-10-02 DIAGNOSIS — S80212S Abrasion, left knee, sequela: Secondary | ICD-10-CM | POA: Diagnosis not present

## 2014-10-02 DIAGNOSIS — N319 Neuromuscular dysfunction of bladder, unspecified: Secondary | ICD-10-CM | POA: Diagnosis not present

## 2014-10-02 LAB — POCT INR: INR: 2.6

## 2014-10-13 ENCOUNTER — Other Ambulatory Visit: Payer: Self-pay

## 2014-10-13 ENCOUNTER — Other Ambulatory Visit: Payer: Self-pay | Admitting: Podiatrist

## 2014-10-13 MED ORDER — CLONAZEPAM 0.5 MG PO TABS: ORAL_TABLET | ORAL | Status: DC

## 2014-10-13 NOTE — Telephone Encounter (Signed)
#  30 written 09/24/14 for 1/2 bid prn Can not be refilled until 10/23/14

## 2014-10-13 NOTE — Telephone Encounter (Signed)
Last seen by you on 12/19/13 Med last phoned to pharmacy 09/24/14

## 2014-10-13 NOTE — Telephone Encounter (Signed)
Clonazepam has been called to pharmacy

## 2014-10-15 ENCOUNTER — Encounter: Payer: Self-pay | Admitting: Gastroenterology

## 2014-10-21 ENCOUNTER — Telehealth: Payer: Self-pay | Admitting: Internal Medicine

## 2014-10-21 NOTE — Telephone Encounter (Signed)
Patient had his clonazepam medication changed from 1/2 pill in morning and a whole pill at night to 1/2 a pill two times day.  The patient wants to know why this was changed.

## 2014-10-21 NOTE — Telephone Encounter (Signed)
This is high risk , controlled substance as per Dr Sheria Lang' s list with very high fall risk. He was in ER in 9/15 with falls.  He can discuss these guidelines with his Pharmacist. Every time it is filled ; the computer issues warning as to risk in elderly.It is meant to be taken as little as possible, as needed only because of these risks If he falls again I will not Rx it @ all

## 2014-10-22 DIAGNOSIS — I509 Heart failure, unspecified: Secondary | ICD-10-CM | POA: Diagnosis not present

## 2014-10-22 DIAGNOSIS — S80211D Abrasion, right knee, subsequent encounter: Secondary | ICD-10-CM | POA: Diagnosis not present

## 2014-10-22 DIAGNOSIS — R338 Other retention of urine: Secondary | ICD-10-CM | POA: Diagnosis not present

## 2014-10-22 DIAGNOSIS — S80212S Abrasion, left knee, sequela: Secondary | ICD-10-CM | POA: Diagnosis not present

## 2014-10-22 DIAGNOSIS — N319 Neuromuscular dysfunction of bladder, unspecified: Secondary | ICD-10-CM | POA: Diagnosis not present

## 2014-10-22 DIAGNOSIS — N401 Enlarged prostate with lower urinary tract symptoms: Secondary | ICD-10-CM | POA: Diagnosis not present

## 2014-10-22 DIAGNOSIS — G629 Polyneuropathy, unspecified: Secondary | ICD-10-CM | POA: Diagnosis not present

## 2014-10-22 DIAGNOSIS — I10 Essential (primary) hypertension: Secondary | ICD-10-CM | POA: Diagnosis not present

## 2014-10-22 DIAGNOSIS — Z466 Encounter for fitting and adjustment of urinary device: Secondary | ICD-10-CM | POA: Diagnosis not present

## 2014-10-22 DIAGNOSIS — I4891 Unspecified atrial fibrillation: Secondary | ICD-10-CM | POA: Diagnosis not present

## 2014-10-22 NOTE — Telephone Encounter (Signed)
Left message for patient to return call.

## 2014-10-23 ENCOUNTER — Telehealth: Payer: Self-pay | Admitting: Internal Medicine

## 2014-10-23 ENCOUNTER — Telehealth: Payer: Self-pay | Admitting: *Deleted

## 2014-10-23 MED ORDER — L-METHYLFOLATE-B6-B12 3-35-2 MG PO TABS: 1.0000 | ORAL_TABLET | Freq: Two times a day (BID) | ORAL | Status: DC

## 2014-10-23 NOTE — Telephone Encounter (Signed)
[name] [phone]  Wound care order for a wound on his bottom  Request for a home PT Evaluation

## 2014-10-23 NOTE — Telephone Encounter (Signed)
Refill request for Metanx generic, L-Methyl-B6-B12.  Dr. Cala Bradford the refill plus 12 additional refills.

## 2014-10-23 NOTE — Telephone Encounter (Signed)
OK as per their protocol

## 2014-10-23 NOTE — Telephone Encounter (Signed)
Left a message for Rondel Oh requested orders.

## 2014-10-26 ENCOUNTER — Other Ambulatory Visit: Payer: Self-pay | Admitting: Cardiology

## 2014-10-29 ENCOUNTER — Other Ambulatory Visit: Payer: Self-pay | Admitting: Internal Medicine

## 2014-10-29 NOTE — Telephone Encounter (Signed)
Done erx  Needs ROV with Dr Linna Darner please in the next 2 mo

## 2014-10-29 NOTE — Telephone Encounter (Signed)
MD out of office. Okay for refill?

## 2014-10-30 ENCOUNTER — Ambulatory Visit (INDEPENDENT_AMBULATORY_CARE_PROVIDER_SITE_OTHER): Payer: Self-pay | Admitting: Cardiology

## 2014-10-30 DIAGNOSIS — S80212S Abrasion, left knee, sequela: Secondary | ICD-10-CM | POA: Diagnosis not present

## 2014-10-30 DIAGNOSIS — Z466 Encounter for fitting and adjustment of urinary device: Secondary | ICD-10-CM | POA: Diagnosis not present

## 2014-10-30 DIAGNOSIS — I509 Heart failure, unspecified: Secondary | ICD-10-CM | POA: Diagnosis not present

## 2014-10-30 DIAGNOSIS — N319 Neuromuscular dysfunction of bladder, unspecified: Secondary | ICD-10-CM | POA: Diagnosis not present

## 2014-10-30 DIAGNOSIS — I4891 Unspecified atrial fibrillation: Secondary | ICD-10-CM | POA: Diagnosis not present

## 2014-10-30 DIAGNOSIS — S80211D Abrasion, right knee, subsequent encounter: Secondary | ICD-10-CM | POA: Diagnosis not present

## 2014-10-30 DIAGNOSIS — G629 Polyneuropathy, unspecified: Secondary | ICD-10-CM | POA: Diagnosis not present

## 2014-10-30 DIAGNOSIS — I10 Essential (primary) hypertension: Secondary | ICD-10-CM | POA: Diagnosis not present

## 2014-10-30 DIAGNOSIS — N401 Enlarged prostate with lower urinary tract symptoms: Secondary | ICD-10-CM | POA: Diagnosis not present

## 2014-10-30 DIAGNOSIS — R338 Other retention of urine: Secondary | ICD-10-CM | POA: Diagnosis not present

## 2014-10-30 LAB — POCT INR: INR: 2.5

## 2014-11-01 ENCOUNTER — Other Ambulatory Visit: Payer: Self-pay | Admitting: Internal Medicine

## 2014-11-03 DIAGNOSIS — N401 Enlarged prostate with lower urinary tract symptoms: Secondary | ICD-10-CM | POA: Diagnosis not present

## 2014-11-03 DIAGNOSIS — I509 Heart failure, unspecified: Secondary | ICD-10-CM | POA: Diagnosis not present

## 2014-11-03 DIAGNOSIS — R338 Other retention of urine: Secondary | ICD-10-CM | POA: Diagnosis not present

## 2014-11-03 DIAGNOSIS — Z466 Encounter for fitting and adjustment of urinary device: Secondary | ICD-10-CM | POA: Diagnosis not present

## 2014-11-03 DIAGNOSIS — G629 Polyneuropathy, unspecified: Secondary | ICD-10-CM | POA: Diagnosis not present

## 2014-11-03 DIAGNOSIS — N319 Neuromuscular dysfunction of bladder, unspecified: Secondary | ICD-10-CM | POA: Diagnosis not present

## 2014-11-03 DIAGNOSIS — I4891 Unspecified atrial fibrillation: Secondary | ICD-10-CM | POA: Diagnosis not present

## 2014-11-03 DIAGNOSIS — S80211D Abrasion, right knee, subsequent encounter: Secondary | ICD-10-CM | POA: Diagnosis not present

## 2014-11-03 DIAGNOSIS — I10 Essential (primary) hypertension: Secondary | ICD-10-CM | POA: Diagnosis not present

## 2014-11-03 DIAGNOSIS — S80212S Abrasion, left knee, sequela: Secondary | ICD-10-CM | POA: Diagnosis not present

## 2014-11-04 NOTE — Telephone Encounter (Signed)
OK #30 Decrease to one each am

## 2014-11-04 NOTE — Telephone Encounter (Signed)
Caregiver states [name] can not get out of house

## 2014-11-10 ENCOUNTER — Telehealth: Payer: Self-pay | Admitting: Adult Health

## 2014-11-10 DIAGNOSIS — I509 Heart failure, unspecified: Secondary | ICD-10-CM | POA: Diagnosis not present

## 2014-11-10 DIAGNOSIS — S80211D Abrasion, right knee, subsequent encounter: Secondary | ICD-10-CM | POA: Diagnosis not present

## 2014-11-10 DIAGNOSIS — S80212S Abrasion, left knee, sequela: Secondary | ICD-10-CM | POA: Diagnosis not present

## 2014-11-10 DIAGNOSIS — N319 Neuromuscular dysfunction of bladder, unspecified: Secondary | ICD-10-CM | POA: Diagnosis not present

## 2014-11-10 DIAGNOSIS — I4891 Unspecified atrial fibrillation: Secondary | ICD-10-CM | POA: Diagnosis not present

## 2014-11-10 DIAGNOSIS — I10 Essential (primary) hypertension: Secondary | ICD-10-CM | POA: Diagnosis not present

## 2014-11-10 DIAGNOSIS — G629 Polyneuropathy, unspecified: Secondary | ICD-10-CM | POA: Diagnosis not present

## 2014-11-10 DIAGNOSIS — R338 Other retention of urine: Secondary | ICD-10-CM | POA: Diagnosis not present

## 2014-11-10 DIAGNOSIS — N401 Enlarged prostate with lower urinary tract symptoms: Secondary | ICD-10-CM | POA: Diagnosis not present

## 2014-11-10 DIAGNOSIS — Z466 Encounter for fitting and adjustment of urinary device: Secondary | ICD-10-CM | POA: Diagnosis not present

## 2014-11-10 NOTE — Telephone Encounter (Signed)
Caregiver [name] is returning your call. Please call [phone].  Thanks!

## 2014-11-10 NOTE — Telephone Encounter (Signed)
TELEPHONE NOTE ON WRONG CHART.  PLEASE DO NOT CALL!!

## 2014-11-12 ENCOUNTER — Other Ambulatory Visit: Payer: Self-pay

## 2014-11-12 MED ORDER — CLONAZEPAM 0.5 MG PO TABS: ORAL_TABLET | ORAL | Status: DC

## 2014-11-12 NOTE — Telephone Encounter (Signed)
Clonazepam has been called to [facility]

## 2014-11-12 NOTE — Telephone Encounter (Signed)
330  1/2 bid prn only  High fall risk

## 2014-11-13 ENCOUNTER — Encounter (HOSPITAL_COMMUNITY): Payer: Self-pay

## 2014-11-13 ENCOUNTER — Emergency Department (HOSPITAL_COMMUNITY)
Admission: EM | Admit: 2014-11-13 | Discharge: 2014-11-13 | Disposition: A | Discharge status: Home / Self Care | Payer: Medicare Other | Attending: Emergency Medicine | Admitting: Emergency Medicine

## 2014-11-13 DIAGNOSIS — N390 Urinary tract infection, site not specified: Secondary | ICD-10-CM

## 2014-11-13 DIAGNOSIS — Z792 Long term (current) use of antibiotics: Secondary | ICD-10-CM | POA: Insufficient documentation

## 2014-11-13 DIAGNOSIS — E785 Hyperlipidemia, unspecified: Secondary | ICD-10-CM | POA: Insufficient documentation

## 2014-11-13 DIAGNOSIS — I10 Essential (primary) hypertension: Secondary | ICD-10-CM | POA: Diagnosis not present

## 2014-11-13 DIAGNOSIS — Z8659 Personal history of other mental and behavioral disorders: Secondary | ICD-10-CM | POA: Insufficient documentation

## 2014-11-13 DIAGNOSIS — Z862 Personal history of diseases of the blood and blood-forming organs and certain disorders involving the immune mechanism: Secondary | ICD-10-CM | POA: Diagnosis not present

## 2014-11-13 DIAGNOSIS — T83511A Infection and inflammatory reaction due to indwelling urethral catheter, initial encounter: Secondary | ICD-10-CM

## 2014-11-13 DIAGNOSIS — T8351XA Infection and inflammatory reaction due to indwelling urinary catheter, initial encounter: Secondary | ICD-10-CM | POA: Diagnosis not present

## 2014-11-13 DIAGNOSIS — Z79899 Other long term (current) drug therapy: Secondary | ICD-10-CM | POA: Insufficient documentation

## 2014-11-13 DIAGNOSIS — R109 Unspecified abdominal pain: Secondary | ICD-10-CM | POA: Diagnosis present

## 2014-11-13 DIAGNOSIS — Q6100 Congenital renal cyst, unspecified: Secondary | ICD-10-CM | POA: Diagnosis not present

## 2014-11-13 DIAGNOSIS — Z7901 Long term (current) use of anticoagulants: Secondary | ICD-10-CM | POA: Diagnosis not present

## 2014-11-13 DIAGNOSIS — T83198A Other mechanical complication of other urinary devices and implants, initial encounter: Secondary | ICD-10-CM | POA: Diagnosis not present

## 2014-11-13 DIAGNOSIS — Z85828 Personal history of other malignant neoplasm of skin: Secondary | ICD-10-CM | POA: Diagnosis not present

## 2014-11-13 DIAGNOSIS — I2510 Atherosclerotic heart disease of native coronary artery without angina pectoris: Secondary | ICD-10-CM | POA: Insufficient documentation

## 2014-11-13 DIAGNOSIS — R1032 Left lower quadrant pain: Secondary | ICD-10-CM | POA: Diagnosis not present

## 2014-11-13 DIAGNOSIS — C801 Malignant (primary) neoplasm, unspecified: Secondary | ICD-10-CM | POA: Diagnosis not present

## 2014-11-13 DIAGNOSIS — N139 Obstructive and reflux uropathy, unspecified: Secondary | ICD-10-CM | POA: Diagnosis not present

## 2014-11-13 DIAGNOSIS — Z87891 Personal history of nicotine dependence: Secondary | ICD-10-CM | POA: Insufficient documentation

## 2014-11-13 DIAGNOSIS — I5020 Unspecified systolic (congestive) heart failure: Secondary | ICD-10-CM | POA: Diagnosis not present

## 2014-11-13 DIAGNOSIS — Y846 Urinary catheterization as the cause of abnormal reaction of the patient, or of later complication, without mention of misadventure at the time of the procedure: Secondary | ICD-10-CM | POA: Diagnosis not present

## 2014-11-13 DIAGNOSIS — T83091A Other mechanical complication of indwelling urethral catheter, initial encounter: Secondary | ICD-10-CM

## 2014-11-13 DIAGNOSIS — T83098A Other mechanical complication of other indwelling urethral catheter, initial encounter: Secondary | ICD-10-CM | POA: Diagnosis not present

## 2014-11-13 LAB — URINALYSIS, ROUTINE W REFLEX MICROSCOPIC
BILIRUBIN URINE: NEGATIVE
Glucose, UA: NEGATIVE mg/dL
Ketones, ur: NEGATIVE mg/dL
Nitrite: NEGATIVE
PH: 7.5 (ref 5.0–8.0)
Protein, ur: NEGATIVE mg/dL
SPECIFIC GRAVITY, URINE: 1.012 (ref 1.005–1.030)
UROBILINOGEN UA: 0.2 mg/dL (ref 0.0–1.0)

## 2014-11-13 LAB — CBC WITH DIFFERENTIAL/PLATELET
Basophils Absolute: 0 10*3/uL (ref 0.0–0.1)
Basophils Relative: 0 % (ref 0–1)
EOS PCT: 2 % (ref 0–5)
Eosinophils Absolute: 0.2 10*3/uL (ref 0.0–0.7)
HCT: 34 % — ABNORMAL LOW (ref 39.0–52.0)
Hemoglobin: 10.6 g/dL — ABNORMAL LOW (ref 13.0–17.0)
LYMPHS ABS: 0.9 10*3/uL (ref 0.7–4.0)
LYMPHS PCT: 8 % — AB (ref 12–46)
MCH: 29.9 pg (ref 26.0–34.0)
MCHC: 31.2 g/dL (ref 30.0–36.0)
MCV: 96 fL (ref 78.0–100.0)
Monocytes Absolute: 1 10*3/uL (ref 0.1–1.0)
Monocytes Relative: 9 % (ref 3–12)
NEUTROS ABS: 9 10*3/uL — AB (ref 1.7–7.7)
Neutrophils Relative %: 81 % — ABNORMAL HIGH (ref 43–77)
PLATELETS: 216 10*3/uL (ref 150–400)
RBC: 3.54 MIL/uL — AB (ref 4.22–5.81)
RDW: 14.8 % (ref 11.5–15.5)
WBC: 11.1 10*3/uL — AB (ref 4.0–10.5)

## 2014-11-13 LAB — PROTIME-INR
INR: 2.01 — ABNORMAL HIGH (ref 0.00–1.49)
PROTHROMBIN TIME: 22.9 s — AB (ref 11.6–15.2)

## 2014-11-13 LAB — I-STAT CHEM 8, ED
BUN: 33 mg/dL — AB (ref 6–23)
CHLORIDE: 104 mmol/L (ref 96–112)
CREATININE: 1.2 mg/dL (ref 0.50–1.35)
Calcium, Ion: 1.17 mmol/L (ref 1.13–1.30)
Glucose, Bld: 116 mg/dL — ABNORMAL HIGH (ref 70–99)
HCT: 34 % — ABNORMAL LOW (ref 39.0–52.0)
Hemoglobin: 11.6 g/dL — ABNORMAL LOW (ref 13.0–17.0)
Potassium: 4.6 mmol/L (ref 3.5–5.1)
SODIUM: 139 mmol/L (ref 135–145)
TCO2: 19 mmol/L (ref 0–100)

## 2014-11-13 LAB — URINE MICROSCOPIC-ADD ON

## 2014-11-13 MED ORDER — SULFAMETHOXAZOLE-TRIMETHOPRIM 800-160 MG PO TABS: 1.0000 | ORAL_TABLET | Freq: Two times a day (BID) | ORAL | Status: DC

## 2014-11-13 MED ORDER — CIPROFLOXACIN HCL 500 MG PO TABS
500.0000 mg | ORAL_TABLET | Freq: Once | ORAL | Status: AC
  Administered 2014-11-13: 500 mg via ORAL
  Filled 2014-11-13: qty 1

## 2014-11-13 NOTE — Progress Notes (Signed)
CSW met with pt at bedside. Daughter and wife were present. Patient confirms that he presents to [facility] due to urinary retention. Patient did not say much during the interview. Patient was extremely hard of hearing.  Daughter confirms that the pt is from home. She states that he lives in [place] with his wife. Also, she states that the pt has a caregiver 7 days a week from 9:00am to 9:00pm. She states that the pt does receive help completing some of his ADL's such as bathing.  Daughter states that she can be considered a support for pt. However, she informed CSW that she does not live local. She informed CSW that she lives in [place].  Daughter/[name] Hascall [phone] Wife/Jean Luce [phone]   , LCSWA [phone] ED CSW 11/13/2014 9:06 PM      

## 2014-11-13 NOTE — ED Provider Notes (Signed)
CSN: [id number]     Arrival date & time 11/13/14  1854 History   First MD Initiated Contact with Patient 11/13/14 1857     Chief Complaint  Patient presents with  . Foley blocked   . Abdominal Pain     (Consider location/radiation/quality/duration/timing/severity/associated sxs/prior Treatment) Patient is a 81 y.o. male presenting with abdominal pain. The history is provided by the patient.  Abdominal Pain Associated symptoms: no chest pain, no constipation, no fatigue, no shortness of breath and no vomiting    patient presents with abdominal pain and decreased output of his Foley catheter. Decreased urination with it since earlier today. No fevers. Dull lower abdominal pain. Previous history of obstructions of his Foley catheter. No diarrhea. No vomiting. Very dark and somewhat black urine Foley.  Past Medical History  Diagnosis Date  . Anemia     nos  . History of colonic polyps   . Hyperlipidemia   . Hypertension   . Cancer [phone]    hx of basal cell  . BPH (benign prostatic hyperplasia)   . Anxiety attack     panic   . Pre-syncope     echo 03/2010,with EF 60%,mild RV dilation,no significant abnormalities...event monitor for 3wks/episodes os sinus bradycardia with heart rate to low 40's occasionally at night(suspect while sleeping)  . Atrial fibrillation     a. Dx 11/2012 - TEE with possible LAA thrombus, started on amio/coumadin.  . Systolic CHF     a. EF 22-02% 11/2012 ?tachy-mediated. b. Not on ACEI at dc due to AKI, can consider as OP.  [name] CAD (coronary artery disease)     a. Mod CAD by cath 12/10/12 - 60% long proximal LAD, 60% prox PDA.  [name] AKI (acute kidney injury)     a. Peak Cr 4.17 11/2012 - felt to be post-obstructive.  . Urinary retention     a. Post obstructive, dc'd with foley 12/2012.  [name] Renal cyst     a. By renal US 11/2012, not candidate for further eval.  . Thrombus of left atrial appendage     a. Possible LAA thrombus by TEE 11/2012.   Past Surgical History   Procedure Laterality Date  . Colonoscopy w/ polypectomy  2007  . Cholecystectomy    . Total knee arthroplasty    . Mohs surgery  2010  . Tee without cardioversion N/A 12/11/2012    Procedure: TRANSESOPHAGEAL ECHOCARDIOGRAM (TEE);  Surgeon: Thayer Headings, MD;  Location: [facility];  Service: Cardiovascular;  Laterality: N/A;  ja/bev.  Darden Dates without cardioversion N/A 01/21/2013    Procedure: TRANSESOPHAGEAL ECHOCARDIOGRAM (TEE);  Surgeon: Larey Dresser, MD;  Location: [facility];  Service: Cardiovascular;  Laterality: N/A;  . Cardioversion N/A 01/21/2013    Procedure: CARDIOVERSION;  Surgeon: Larey Dresser, MD;  Location: [facility];  Service: Cardiovascular;  Laterality: N/A;  . Left heart catheterization with coronary angiogram N/A 12/10/2012    Procedure: LEFT HEART CATHETERIZATION WITH CORONARY ANGIOGRAM;  Surgeon: Larey Dresser, MD;  Location: [facility] CATH LAB;  Service: Cardiovascular;  Laterality: N/A;   Family History  Problem Relation Age of Onset  . Heart attack Father   . Heart failure Father 39    S/P Turp  . Heart disease Father 71    MI.Marland KitchenMarland KitchenCHF S/P TURP @ 65  . Stroke Brother 61  . Coronary artery disease Brother   . Lung cancer Brother    History  Substance Use Topics  . Smoking status: Former Smoker  Quit date: 08/22/1983  . Smokeless tobacco: Not on file  . Alcohol Use: No    Review of Systems  Constitutional: Negative for fatigue.  Respiratory: Negative for shortness of breath.   Cardiovascular: Negative for chest pain.  Gastrointestinal: Positive for abdominal pain. Negative for vomiting and constipation.  Musculoskeletal: Negative for back pain.      Allergies  Darifenacin hydrobromide and Fluoxetine hcl  Home Medications   Prior to Admission medications   Medication Sig Start Date End Date Taking? Authorizing Provider  acetaminophen (TYLENOL) 325 MG tablet Take 650 mg by mouth every 6 (six) hours as needed for moderate pain (pain).    Yes  Historical Provider, MD  amiodarone (PACERONE) 200 MG tablet TAKE 1/2 TABLET (100 MG TOTAL) BY MOUTH DAILY. 09/02/14  Yes Larey Dresser, MD  buPROPion (WELLBUTRIN) 75 MG tablet Take 75 mg by mouth 2 (two) times daily.   Yes Historical Provider, MD  clonazePAM (KLONOPIN) 0.5 MG tablet Take 1/2 tablet BID prn High risk of falls with this medication 11/12/14  Yes Hendricks Limes, MD  finasteride (PROSCAR) 5 MG tablet Take 1 tablet (5 mg total) by mouth daily. 12/20/12  Yes Dayna N Dunn, PA-C  furosemide (LASIX) 40 MG tablet TAKE 1 TABLET (40 MG TOTAL) BY MOUTH EVERY OTHER DAY. 09/25/14  Yes Larey Dresser, MD  l-methylfolate-B6-B12 (METANX) 3-35-2 MG TABS Take 1 tablet by mouth 2 (two) times daily.   Yes Historical Provider, MD  lisinopril (PRINIVIL,ZESTRIL) 5 MG tablet Take 0.5 tablets (2.5 mg total) by mouth daily. 08/04/14  Yes Larey Dresser, MD  Multiple Vitamin (MULTIVITAMIN) tablet Take 1 tablet by mouth daily.   Yes Historical Provider, MD  pravastatin (PRAVACHOL) 80 MG tablet TAKE 1 TABLET BY MOUTH DAILY. 08/03/14  Yes Larey Dresser, MD  Probiotic Product (ALIGN) 4 MG CAPS Take 1 capsule by mouth daily.    Yes Historical Provider, MD  warfarin (COUMADIN) 2.5 MG tablet Take 2.5 mg by mouth daily. Take 1 tablet (2.5 mg) by mouth on Mon, Tues, Wed, Fri, Sat & Take 1.5 tablets (3.75 mg) by mouth on Thurs and Sun.   Yes Historical Provider, MD  cephALEXin (KEFLEX) 500 MG capsule Take 1 capsule (500 mg total) by mouth 2 (two) times daily. Patient not taking: Reported on 11/13/2014 05/20/14   Davonna Belling, MD  Meth-Hyo-M Bl-Na Phos-Ph Sal (URIBEL PO) Take 1 capsule by mouth daily as needed (bladder spasm).     Historical Provider, MD  phenazopyridine (PYRIDIUM) 100 MG tablet Take 100 mg by mouth 3 (three) times daily as needed for pain (bladder spasms).     Historical Provider, MD  sulfamethoxazole-trimethoprim (BACTRIM DS,SEPTRA DS) 800-160 MG per tablet Take 1 tablet by mouth 2 (two) times daily.  11/13/14   Davonna Belling, MD  warfarin (COUMADIN) 2.5 MG tablet TAKE AS DIRECTED BY ANTICOAGULATION CLINIC Patient not taking: Reported on 11/13/2014 09/07/14   Larey Dresser, MD   BP 119/58 mmHg  Pulse 63  Temp(Src) 97.5 F (36.4 C) (Oral)  Resp 18  Ht 6' (1.829 m)  Wt 185 lb (83.915 kg)  BMI 25.08 kg/m2  SpO2 95% Physical Exam  Constitutional: He appears well-developed.  HENT:  Head: Normocephalic.  Eyes: Pupils are equal, round, and reactive to light.  Cardiovascular: Normal rate and regular rhythm.   Abdominal: He exhibits distension. There is no tenderness.  Genitourinary:  Very dark urine in Foley catheter. Some lower abdominal distention. Cloudy urine. Foley catheter in place.  Musculoskeletal: He exhibits no tenderness.    ED Course  Procedures (including critical care time) Labs Review Labs Reviewed  URINALYSIS, ROUTINE W REFLEX MICROSCOPIC - Abnormal; Notable for the following:    APPearance CLOUDY (*)    Hgb urine dipstick MODERATE (*)    Leukocytes, UA LARGE (*)    All other components within normal limits  CBC WITH DIFFERENTIAL/PLATELET - Abnormal; Notable for the following:    WBC 11.1 (*)    RBC 3.54 (*)    Hemoglobin 10.6 (*)    HCT 34.0 (*)    Neutrophils Relative % 81 (*)    Neutro Abs 9.0 (*)    Lymphocytes Relative 8 (*)    All other components within normal limits  PROTIME-INR - Abnormal; Notable for the following:    Prothrombin Time 22.9 (*)    INR 2.01 (*)    All other components within normal limits  URINE MICROSCOPIC-ADD ON - Abnormal; Notable for the following:    Bacteria, UA MANY (*)    Crystals TRIPLE PHOSPHATE CRYSTALS (*)    All other components within normal limits  I-STAT CHEM 8, ED - Abnormal; Notable for the following:    BUN 33 (*)    Glucose, Bld 116 (*)    Hemoglobin 11.6 (*)    HCT 34.0 (*)    All other components within normal limits  URINE CULTURE    Imaging Review No results found.   EKG Interpretation None       MDM   Final diagnoses:  Obstructed Foley catheter, initial encounter  Urinary tract infection associated with catheterization of urinary tract, initial encounter    Patient was obstructed Foley. Urine shows possible infection. He does have a chronic Foley but has had symptoms of increased spasms and feeling somewhat fatigued over the last day or 2. Culture sent and will treat with some antibiotics. Initial dose of Cipro given orally but will switch to Bactrim due to previous susceptibilities and the patient's comorbidities. Patient was instructed to follow-up with Dr. Tresa Moore on Monday to review the culture and decide about antibiotics.    Davonna Belling, MD 11/13/14 [phone]

## 2014-11-13 NOTE — ED Notes (Signed)
Bed: HW29 Expected date:  Expected time:  Means of arrival:  Comments: Hold EMS Foley blocked

## 2014-11-13 NOTE — ED Notes (Signed)
Pt states pain started last night and into this morning. Increased pain today. Noticed "blockage 2 hours ago". Attempted to irrigate without success. Here in September for the same. DX then with UTI. "they need to replace my foley "

## 2014-11-13 NOTE — ED Notes (Signed)
Per [facility]- Pt resides at home with care taker. Pt c/o of urinary retention several hours ago. Care taker attempted to relieve foley without success. Pt c/o abdominal pressure. Denies N/V. Denies any other complaints

## 2014-11-13 NOTE — Discharge Instructions (Signed)

## 2014-11-17 LAB — URINE CULTURE: Colony Count: >=100000

## 2014-11-18 ENCOUNTER — Telehealth (HOSPITAL_BASED_OUTPATIENT_CLINIC_OR_DEPARTMENT_OTHER): Payer: Self-pay | Admitting: Emergency Medicine

## 2014-11-18 NOTE — Telephone Encounter (Signed)
Post ED Visit - Positive Culture Follow-up: Successful Patient Follow-Up  Culture assessed and recommendations reviewed by: []  [place], Pharm.D., BCPS []  Heide Guile, Pharm.D., BCPS []  Alycia Rossetti, Pharm.D., BCPS []  [place], [place].D., BCPS, AAHIVP [x]  Legrand Como, Pharm.D., BCPS, AAHIVP []  Hassie Bruce, Pharm.D. []  Milus Glazier, Pharm.D.  Positive urine culture Enterococcus  []  Patient discharged without antimicrobial prescription and treatment is now indicated [x]  Organism is resistant to prescribed ED discharge antimicrobial []  Patient with positive blood cultures  Changes discussed with ED provider: [facility] New antibiotic prescription Doxycycline 100mg  po bid x 7 days [facility] [place]  Contacted patient, date 11/18/14 1115   Hazle Nordmann 11/18/2014, 2:24 PM

## 2014-11-18 NOTE — Progress Notes (Signed)
ED Antimicrobial Stewardship Positive Culture Follow Up   [name] is an 81 y.o. male who presented to [facility] on 11/13/2014 with a chief complaint of  Chief Complaint  Patient presents with  . Foley blocked   . Abdominal Pain    Recent Results (from the past 720 hour(s))  Urine culture     Status: None   Collection Time: 11/13/14  7:44 PM  Result Value Ref Range Status   Specimen Description URINE, CATHETERIZED  Final   Special Requests NONE  Final   Colony Count   Final    >=100,000 COLONIES/ML Performed at [facility]    Culture   Final    METHICILLIN RESISTANT STAPHYLOCOCCUS AUREUS Note: RIFAMPIN AND GENTAMICIN SHOULD NOT BE USED AS SINGLE DRUGS FOR TREATMENT OF STAPH INFECTIONS. ENTEROCOCCUS SPECIES Performed at [facility]    Report Status 11/17/2014 FINAL  Final   Organism ID, Bacteria METHICILLIN RESISTANT STAPHYLOCOCCUS AUREUS  Final   Organism ID, Bacteria ENTEROCOCCUS SPECIES  Final      Susceptibility   Enterococcus species - MIC*    AMPICILLIN <=2 SENSITIVE Sensitive     LEVOFLOXACIN 1 SENSITIVE Sensitive     NITROFURANTOIN <=16 SENSITIVE Sensitive     VANCOMYCIN 1 SENSITIVE Sensitive     TETRACYCLINE <=1 SENSITIVE Sensitive     * ENTEROCOCCUS SPECIES   Methicillin resistant staphylococcus aureus - MIC*    GENTAMICIN <=0.5 SENSITIVE Sensitive     LEVOFLOXACIN 4 INTERMEDIATE Intermediate     NITROFURANTOIN <=16 SENSITIVE Sensitive     OXACILLIN >=4 RESISTANT Resistant     PENICILLIN >=0.5 RESISTANT Resistant     RIFAMPIN <=0.5 SENSITIVE Sensitive     TRIMETH/SULFA <=10 SENSITIVE Sensitive     VANCOMYCIN 1 SENSITIVE Sensitive     TETRACYCLINE <=1 SENSITIVE Sensitive     * METHICILLIN RESISTANT STAPHYLOCOCCUS AUREUS    [x]  Treated with Septra, organism resistant to prescribed antimicrobial  New antibiotic prescription: Doxycycline 100mg  PO BID x 7 days.  Patient should stop Septra.  ED Provider: Alvina Chou,  PA-C   Norva Riffle 11/18/2014, 11:23 AM Infectious Diseases Pharmacist Phone# [phone]

## 2014-11-19 ENCOUNTER — Ambulatory Visit (INDEPENDENT_AMBULATORY_CARE_PROVIDER_SITE_OTHER): Payer: Medicare Other | Admitting: Pharmacist

## 2014-11-19 DIAGNOSIS — I10 Essential (primary) hypertension: Secondary | ICD-10-CM | POA: Diagnosis not present

## 2014-11-19 DIAGNOSIS — S80212S Abrasion, left knee, sequela: Secondary | ICD-10-CM | POA: Diagnosis not present

## 2014-11-19 DIAGNOSIS — G629 Polyneuropathy, unspecified: Secondary | ICD-10-CM | POA: Diagnosis not present

## 2014-11-19 DIAGNOSIS — Z466 Encounter for fitting and adjustment of urinary device: Secondary | ICD-10-CM | POA: Diagnosis not present

## 2014-11-19 DIAGNOSIS — I4891 Unspecified atrial fibrillation: Secondary | ICD-10-CM

## 2014-11-19 DIAGNOSIS — I509 Heart failure, unspecified: Secondary | ICD-10-CM | POA: Diagnosis not present

## 2014-11-19 DIAGNOSIS — N401 Enlarged prostate with lower urinary tract symptoms: Secondary | ICD-10-CM | POA: Diagnosis not present

## 2014-11-19 DIAGNOSIS — S80211D Abrasion, right knee, subsequent encounter: Secondary | ICD-10-CM | POA: Diagnosis not present

## 2014-11-19 DIAGNOSIS — N319 Neuromuscular dysfunction of bladder, unspecified: Secondary | ICD-10-CM | POA: Diagnosis not present

## 2014-11-19 DIAGNOSIS — R338 Other retention of urine: Secondary | ICD-10-CM | POA: Diagnosis not present

## 2014-11-19 LAB — POCT INR: INR: 2

## 2014-11-24 ENCOUNTER — Telehealth: Payer: Self-pay | Admitting: Cardiology

## 2014-11-24 ENCOUNTER — Ambulatory Visit (INDEPENDENT_AMBULATORY_CARE_PROVIDER_SITE_OTHER): Payer: Medicare Other | Admitting: Cardiology

## 2014-11-24 DIAGNOSIS — N319 Neuromuscular dysfunction of bladder, unspecified: Secondary | ICD-10-CM | POA: Diagnosis not present

## 2014-11-24 DIAGNOSIS — N401 Enlarged prostate with lower urinary tract symptoms: Secondary | ICD-10-CM | POA: Diagnosis not present

## 2014-11-24 DIAGNOSIS — I10 Essential (primary) hypertension: Secondary | ICD-10-CM | POA: Diagnosis not present

## 2014-11-24 DIAGNOSIS — I4891 Unspecified atrial fibrillation: Secondary | ICD-10-CM

## 2014-11-24 DIAGNOSIS — I509 Heart failure, unspecified: Secondary | ICD-10-CM | POA: Diagnosis not present

## 2014-11-24 DIAGNOSIS — G629 Polyneuropathy, unspecified: Secondary | ICD-10-CM | POA: Diagnosis not present

## 2014-11-24 DIAGNOSIS — S80212S Abrasion, left knee, sequela: Secondary | ICD-10-CM | POA: Diagnosis not present

## 2014-11-24 DIAGNOSIS — Z466 Encounter for fitting and adjustment of urinary device: Secondary | ICD-10-CM | POA: Diagnosis not present

## 2014-11-24 DIAGNOSIS — R338 Other retention of urine: Secondary | ICD-10-CM | POA: Diagnosis not present

## 2014-11-24 DIAGNOSIS — S80211D Abrasion, right knee, subsequent encounter: Secondary | ICD-10-CM | POA: Diagnosis not present

## 2014-11-24 LAB — POCT INR: INR: 1.8

## 2014-11-24 NOTE — Telephone Encounter (Signed)
Returned call to [place], [employer].  LMOM TCB

## 2014-11-24 NOTE — Telephone Encounter (Signed)
Spoke with Nicki Reaper, RN [facility] gave verbal order to recheck pt INR 1 week or as instructed by Coumadin clinic.

## 2014-11-24 NOTE — Telephone Encounter (Signed)
New message      Need to recertify pt for every other week for PT/INR draws.  Please call

## 2014-11-28 IMAGING — US US RENAL
1 series · 13 of 25 positions shown · non-contrast
Comparison: 10/22/2006

CLINICAL DATA: Acute renal insufficiency after cardiac
catheterization.

RENAL/URINARY TRACT ULTRASOUND COMPLETE

[Series 1: us renal · 0.35mm/px · 13 of 41 slices shown]
[im 1/41]
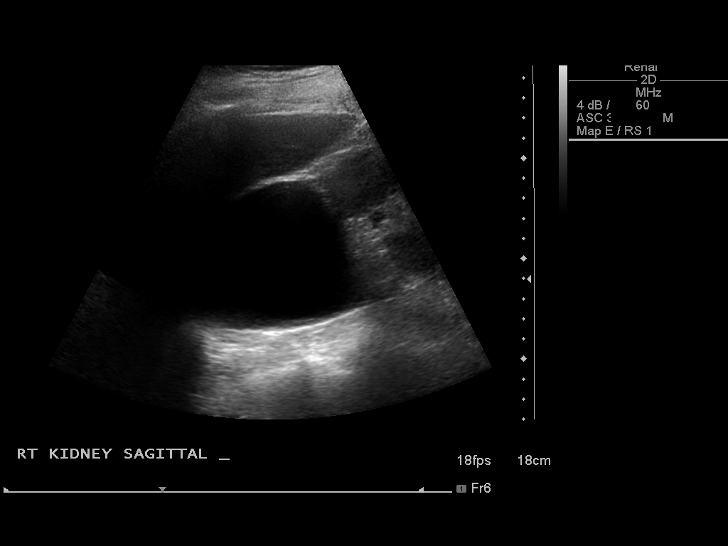
[im 4/41]
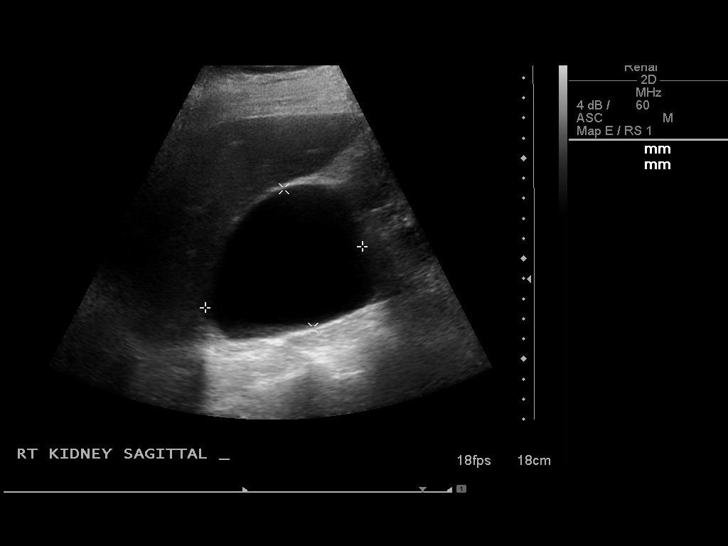
[im 7/41]
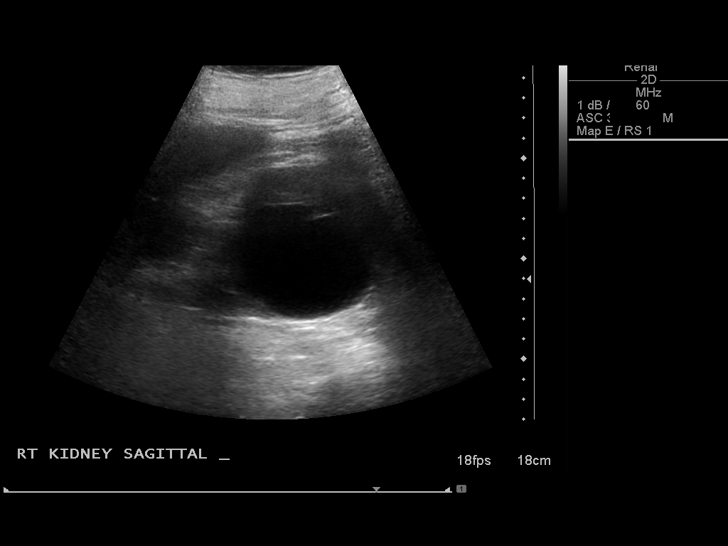
[im 11/41]
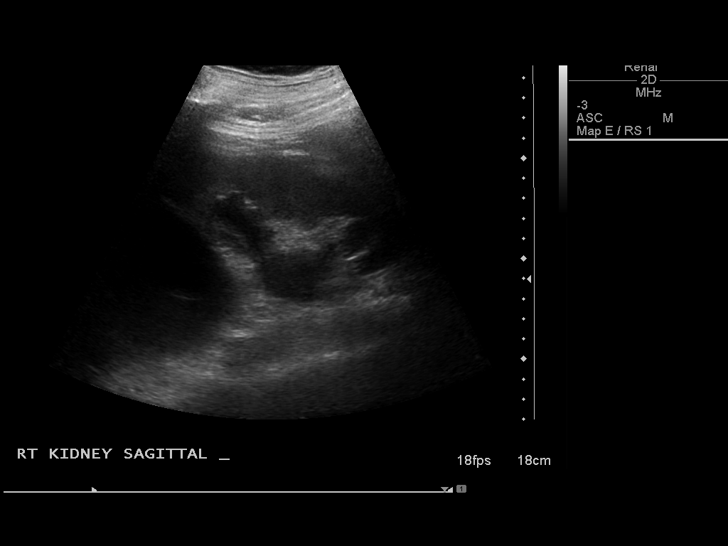
[im 14/41]
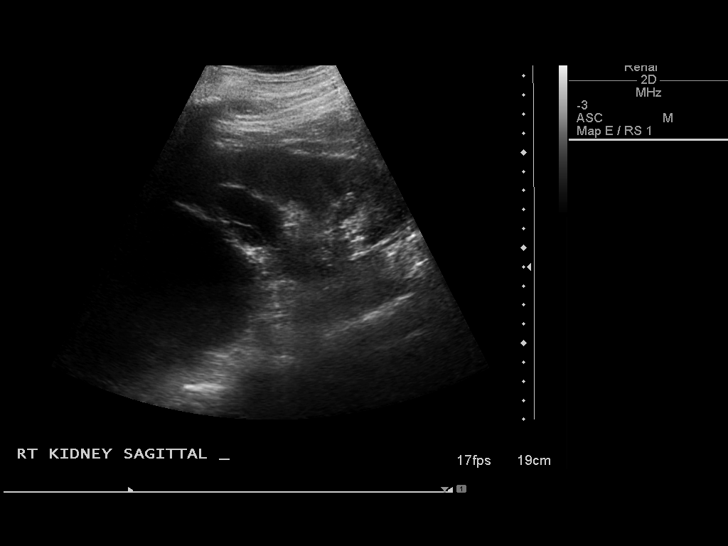
[im 17/41]
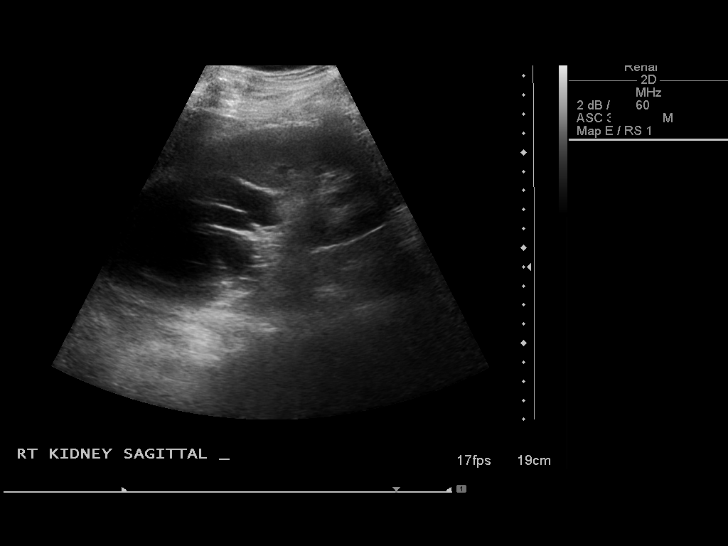
[im 21/41]
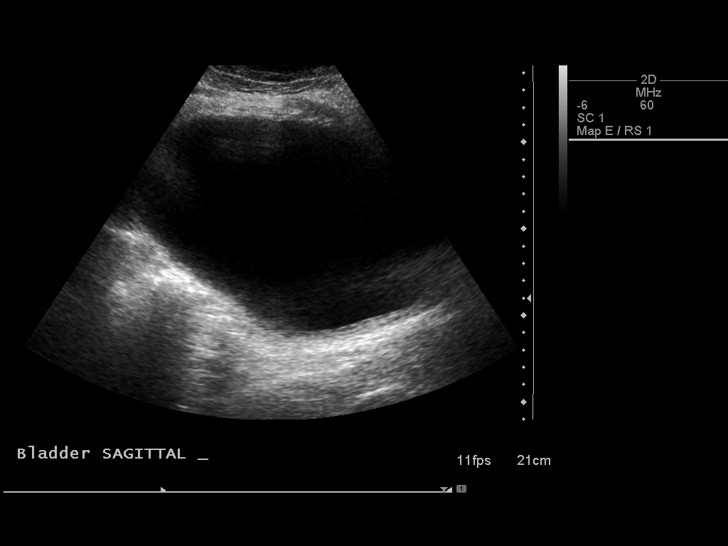
[im 24/41]
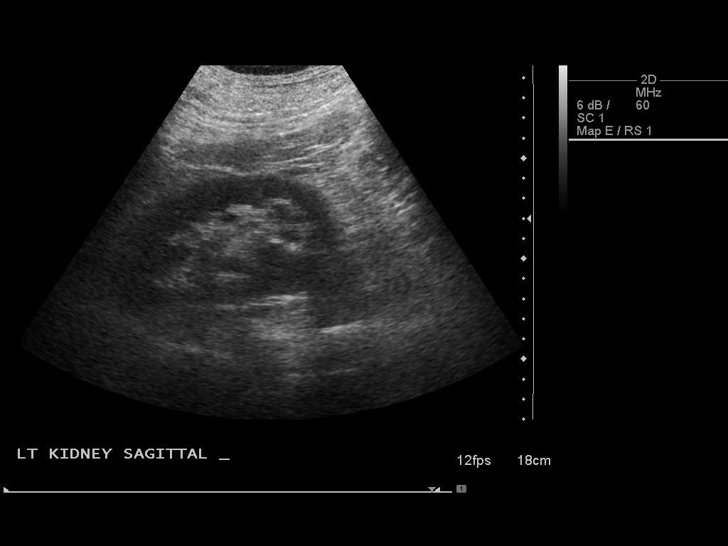
[im 27/41]
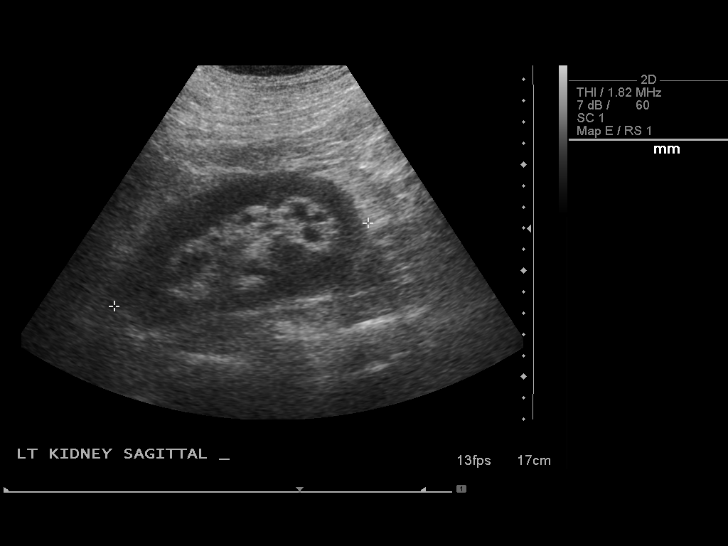
[im 31/41]
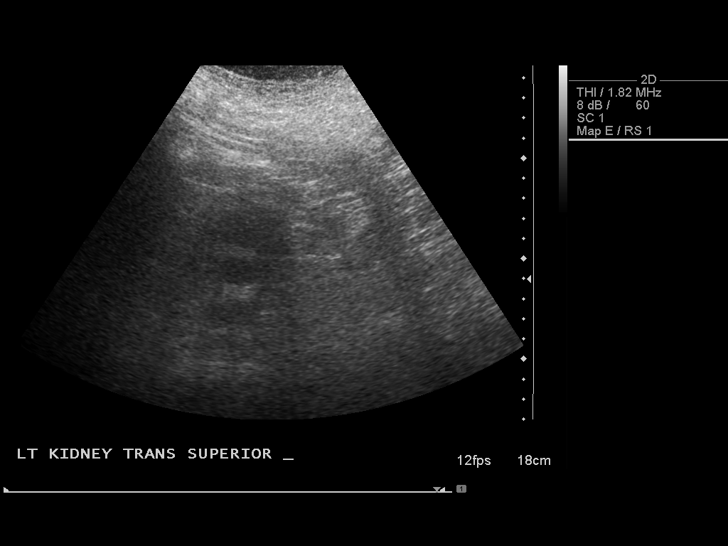
[im 34/41]
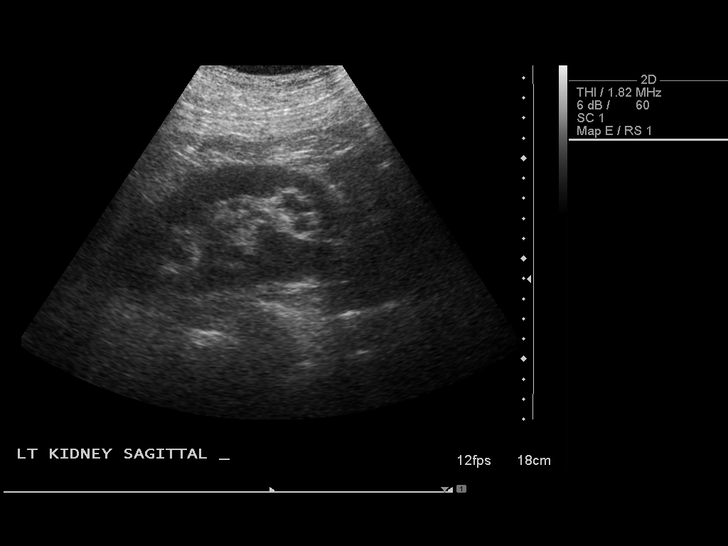
[im 37/41]
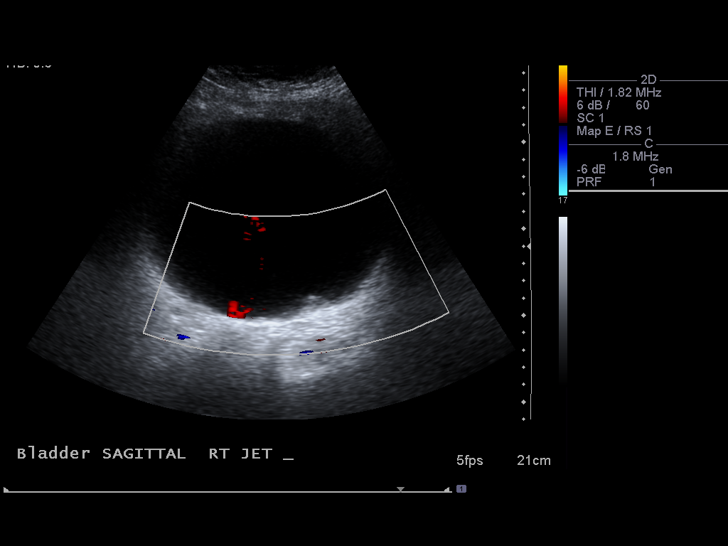
[im 41/41]
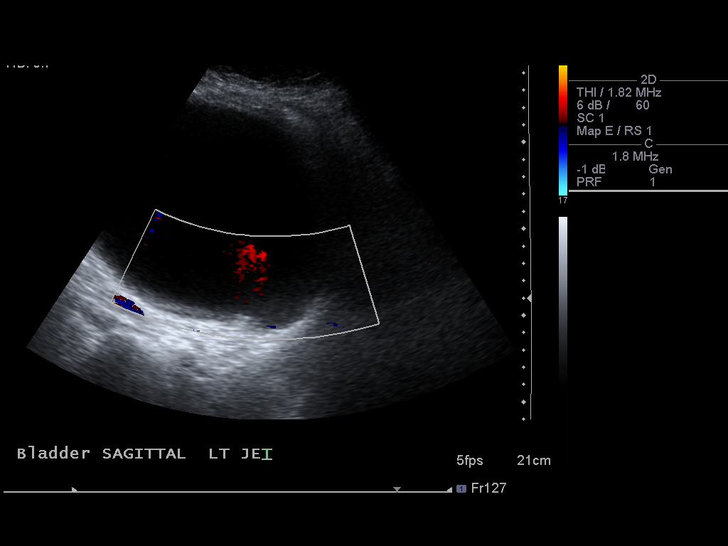

[13 of 25 positions shown; findings below may reference images not displayed]

FINDINGS: Right Kidney:  15.2 cm.  Mild to moderate hydronephrosis.  Upper
pole right renal cyst measures 8.4 cm and is enlarged since the
prior exam.  There is an interpolar right renal 6.9 cm cyst.  This
may have complexity in its inferior portion on image 19.
Alternatively this apparent complexity could be artifactual. Normal
renal cortical thickness for age.

Left Kidney:  12.6 cm.  Normal renal cortical thickness for age.
Minimal pelvicaliectasis.  Favored to be a secondary to the extent
of bladder distention.

Bladder:  Borderline bladder distention.  Bilateral ureteric jets
identified.
IMPRESSION: 1.  Mild-moderate right and minimal left-sided hydronephrosis.
Left-sided pelvicaliectasis is favored to be within normal
variation, given the extent of borderline bladder distention.
Right sided hydronephrosis is without identifiable cause.  Consider
further characterization with CT.
2.  Right renal lesions.  Dominant upper pole lesion which is
likely a cyst.  An interpolar lesion may have complexity in its
inferior portion.  This also would be better evaluated with CT.  If
the patient cannot receive CT contrast, unenhanced non emergent MRI
may also be informative.

## 2014-12-01 ENCOUNTER — Ambulatory Visit (INDEPENDENT_AMBULATORY_CARE_PROVIDER_SITE_OTHER): Payer: Medicare Other | Admitting: Pharmacist Clinician (PhC)/ Clinical Pharmacy Specialist

## 2014-12-01 DIAGNOSIS — I10 Essential (primary) hypertension: Secondary | ICD-10-CM | POA: Diagnosis not present

## 2014-12-01 DIAGNOSIS — I4891 Unspecified atrial fibrillation: Secondary | ICD-10-CM | POA: Diagnosis not present

## 2014-12-01 DIAGNOSIS — Z5181 Encounter for therapeutic drug level monitoring: Secondary | ICD-10-CM | POA: Diagnosis not present

## 2014-12-01 DIAGNOSIS — R338 Other retention of urine: Secondary | ICD-10-CM | POA: Diagnosis not present

## 2014-12-01 DIAGNOSIS — I509 Heart failure, unspecified: Secondary | ICD-10-CM | POA: Diagnosis not present

## 2014-12-01 DIAGNOSIS — Z7901 Long term (current) use of anticoagulants: Secondary | ICD-10-CM | POA: Diagnosis not present

## 2014-12-01 DIAGNOSIS — G629 Polyneuropathy, unspecified: Secondary | ICD-10-CM | POA: Diagnosis not present

## 2014-12-01 DIAGNOSIS — N319 Neuromuscular dysfunction of bladder, unspecified: Secondary | ICD-10-CM | POA: Diagnosis not present

## 2014-12-01 DIAGNOSIS — N401 Enlarged prostate with lower urinary tract symptoms: Secondary | ICD-10-CM | POA: Diagnosis not present

## 2014-12-01 DIAGNOSIS — Z466 Encounter for fitting and adjustment of urinary device: Secondary | ICD-10-CM | POA: Diagnosis not present

## 2014-12-01 LAB — POCT INR: INR: 2.4

## 2014-12-03 ENCOUNTER — Telehealth: Payer: Self-pay | Admitting: *Deleted

## 2014-12-03 NOTE — Telephone Encounter (Signed)
[name] through [facility] might-- her number is [phone]

## 2014-12-03 NOTE — Telephone Encounter (Addendum)
Pt's dtr, [name] states pt is homebound and has home health care through [facility], but they do not do toenail debridement.  Pt states she is in [place] and would like to know if there is anyone who trims the toenails for homebound pts.  Dr. Shaune Pollack information concerning [facility] called to pt's dtr, [name].

## 2014-12-08 ENCOUNTER — Telehealth: Payer: Self-pay | Admitting: Internal Medicine

## 2014-12-08 ENCOUNTER — Ambulatory Visit (INDEPENDENT_AMBULATORY_CARE_PROVIDER_SITE_OTHER): Payer: Medicare Other | Admitting: Interventional Cardiology

## 2014-12-08 DIAGNOSIS — I4891 Unspecified atrial fibrillation: Secondary | ICD-10-CM | POA: Diagnosis not present

## 2014-12-08 DIAGNOSIS — Z5181 Encounter for therapeutic drug level monitoring: Secondary | ICD-10-CM | POA: Diagnosis not present

## 2014-12-08 DIAGNOSIS — R338 Other retention of urine: Secondary | ICD-10-CM | POA: Diagnosis not present

## 2014-12-08 DIAGNOSIS — Z7901 Long term (current) use of anticoagulants: Secondary | ICD-10-CM | POA: Diagnosis not present

## 2014-12-08 DIAGNOSIS — I10 Essential (primary) hypertension: Secondary | ICD-10-CM | POA: Diagnosis not present

## 2014-12-08 DIAGNOSIS — G629 Polyneuropathy, unspecified: Secondary | ICD-10-CM | POA: Diagnosis not present

## 2014-12-08 DIAGNOSIS — Z466 Encounter for fitting and adjustment of urinary device: Secondary | ICD-10-CM | POA: Diagnosis not present

## 2014-12-08 DIAGNOSIS — N401 Enlarged prostate with lower urinary tract symptoms: Secondary | ICD-10-CM | POA: Diagnosis not present

## 2014-12-08 DIAGNOSIS — I509 Heart failure, unspecified: Secondary | ICD-10-CM | POA: Diagnosis not present

## 2014-12-08 DIAGNOSIS — N319 Neuromuscular dysfunction of bladder, unspecified: Secondary | ICD-10-CM | POA: Diagnosis not present

## 2014-12-08 LAB — POCT INR: INR: 2.1

## 2014-12-08 NOTE — Telephone Encounter (Signed)
Phone call to [place] with [facility] and advised okay for skilled nursing weekly.

## 2014-12-08 NOTE — Telephone Encounter (Signed)
Ok

## 2014-12-08 NOTE — Telephone Encounter (Signed)
Requesting skilled nursing visits increased to weekly for ulcerated wound on bottom.

## 2014-12-14 ENCOUNTER — Other Ambulatory Visit: Payer: Self-pay | Admitting: Cardiology

## 2014-12-15 ENCOUNTER — Other Ambulatory Visit: Payer: Self-pay

## 2014-12-15 DIAGNOSIS — I10 Essential (primary) hypertension: Secondary | ICD-10-CM | POA: Diagnosis not present

## 2014-12-15 DIAGNOSIS — I4891 Unspecified atrial fibrillation: Secondary | ICD-10-CM | POA: Diagnosis not present

## 2014-12-15 DIAGNOSIS — N319 Neuromuscular dysfunction of bladder, unspecified: Secondary | ICD-10-CM | POA: Diagnosis not present

## 2014-12-15 DIAGNOSIS — G629 Polyneuropathy, unspecified: Secondary | ICD-10-CM | POA: Diagnosis not present

## 2014-12-15 DIAGNOSIS — Z466 Encounter for fitting and adjustment of urinary device: Secondary | ICD-10-CM | POA: Diagnosis not present

## 2014-12-15 DIAGNOSIS — Z7901 Long term (current) use of anticoagulants: Secondary | ICD-10-CM | POA: Diagnosis not present

## 2014-12-15 DIAGNOSIS — R338 Other retention of urine: Secondary | ICD-10-CM | POA: Diagnosis not present

## 2014-12-15 DIAGNOSIS — Z5181 Encounter for therapeutic drug level monitoring: Secondary | ICD-10-CM | POA: Diagnosis not present

## 2014-12-15 DIAGNOSIS — N401 Enlarged prostate with lower urinary tract symptoms: Secondary | ICD-10-CM | POA: Diagnosis not present

## 2014-12-15 DIAGNOSIS — I509 Heart failure, unspecified: Secondary | ICD-10-CM | POA: Diagnosis not present

## 2014-12-15 MED ORDER — CLONAZEPAM 0.5 MG PO TABS: ORAL_TABLET | ORAL | Status: DC

## 2014-12-15 NOTE — Telephone Encounter (Signed)
OK X1 

## 2014-12-15 NOTE — Telephone Encounter (Signed)
[facility] [address]

## 2014-12-22 ENCOUNTER — Ambulatory Visit (INDEPENDENT_AMBULATORY_CARE_PROVIDER_SITE_OTHER): Payer: Medicare Other | Admitting: Cardiology

## 2014-12-22 DIAGNOSIS — Z466 Encounter for fitting and adjustment of urinary device: Secondary | ICD-10-CM | POA: Diagnosis not present

## 2014-12-22 DIAGNOSIS — I10 Essential (primary) hypertension: Secondary | ICD-10-CM | POA: Diagnosis not present

## 2014-12-22 DIAGNOSIS — R338 Other retention of urine: Secondary | ICD-10-CM | POA: Diagnosis not present

## 2014-12-22 DIAGNOSIS — I4891 Unspecified atrial fibrillation: Secondary | ICD-10-CM | POA: Diagnosis not present

## 2014-12-22 DIAGNOSIS — I509 Heart failure, unspecified: Secondary | ICD-10-CM | POA: Diagnosis not present

## 2014-12-22 DIAGNOSIS — G629 Polyneuropathy, unspecified: Secondary | ICD-10-CM | POA: Diagnosis not present

## 2014-12-22 DIAGNOSIS — N401 Enlarged prostate with lower urinary tract symptoms: Secondary | ICD-10-CM | POA: Diagnosis not present

## 2014-12-22 DIAGNOSIS — N319 Neuromuscular dysfunction of bladder, unspecified: Secondary | ICD-10-CM | POA: Diagnosis not present

## 2014-12-22 DIAGNOSIS — Z7901 Long term (current) use of anticoagulants: Secondary | ICD-10-CM | POA: Diagnosis not present

## 2014-12-22 DIAGNOSIS — Z5181 Encounter for therapeutic drug level monitoring: Secondary | ICD-10-CM | POA: Diagnosis not present

## 2014-12-22 LAB — POCT INR: INR: 3

## 2014-12-26 ENCOUNTER — Other Ambulatory Visit: Payer: Self-pay | Admitting: Internal Medicine

## 2014-12-27 DIAGNOSIS — R338 Other retention of urine: Secondary | ICD-10-CM | POA: Diagnosis not present

## 2014-12-27 DIAGNOSIS — Z466 Encounter for fitting and adjustment of urinary device: Secondary | ICD-10-CM | POA: Diagnosis not present

## 2014-12-27 DIAGNOSIS — N401 Enlarged prostate with lower urinary tract symptoms: Secondary | ICD-10-CM | POA: Diagnosis not present

## 2014-12-27 DIAGNOSIS — I509 Heart failure, unspecified: Secondary | ICD-10-CM | POA: Diagnosis not present

## 2014-12-27 DIAGNOSIS — Z7901 Long term (current) use of anticoagulants: Secondary | ICD-10-CM | POA: Diagnosis not present

## 2014-12-27 DIAGNOSIS — I4891 Unspecified atrial fibrillation: Secondary | ICD-10-CM | POA: Diagnosis not present

## 2014-12-27 DIAGNOSIS — Z5181 Encounter for therapeutic drug level monitoring: Secondary | ICD-10-CM | POA: Diagnosis not present

## 2014-12-27 DIAGNOSIS — I10 Essential (primary) hypertension: Secondary | ICD-10-CM | POA: Diagnosis not present

## 2014-12-27 DIAGNOSIS — G629 Polyneuropathy, unspecified: Secondary | ICD-10-CM | POA: Diagnosis not present

## 2014-12-27 DIAGNOSIS — N319 Neuromuscular dysfunction of bladder, unspecified: Secondary | ICD-10-CM | POA: Diagnosis not present

## 2014-12-29 ENCOUNTER — Telehealth: Payer: Self-pay | Admitting: Internal Medicine

## 2014-12-29 DIAGNOSIS — R338 Other retention of urine: Secondary | ICD-10-CM | POA: Diagnosis not present

## 2014-12-29 DIAGNOSIS — I4891 Unspecified atrial fibrillation: Secondary | ICD-10-CM | POA: Diagnosis not present

## 2014-12-29 DIAGNOSIS — Z466 Encounter for fitting and adjustment of urinary device: Secondary | ICD-10-CM | POA: Diagnosis not present

## 2014-12-29 DIAGNOSIS — I509 Heart failure, unspecified: Secondary | ICD-10-CM | POA: Diagnosis not present

## 2014-12-29 DIAGNOSIS — Z7901 Long term (current) use of anticoagulants: Secondary | ICD-10-CM | POA: Diagnosis not present

## 2014-12-29 DIAGNOSIS — N401 Enlarged prostate with lower urinary tract symptoms: Secondary | ICD-10-CM | POA: Diagnosis not present

## 2014-12-29 DIAGNOSIS — Z5181 Encounter for therapeutic drug level monitoring: Secondary | ICD-10-CM | POA: Diagnosis not present

## 2014-12-29 DIAGNOSIS — N319 Neuromuscular dysfunction of bladder, unspecified: Secondary | ICD-10-CM | POA: Diagnosis not present

## 2014-12-29 DIAGNOSIS — I10 Essential (primary) hypertension: Secondary | ICD-10-CM | POA: Diagnosis not present

## 2014-12-29 DIAGNOSIS — G629 Polyneuropathy, unspecified: Secondary | ICD-10-CM | POA: Diagnosis not present

## 2014-12-29 MED ORDER — BUPROPION HCL 75 MG PO TABS: 75.0000 mg | ORAL_TABLET | Freq: Two times a day (BID) | ORAL | Status: DC

## 2014-12-29 NOTE — Telephone Encounter (Signed)
#  60  I am transitioning to retirement from primary care. You should transfer your primary care to another physician in the next 3 months, by August 3,2016,to guarantee continuity of care . Your medical records can be sent to your new physician or will be accessible through the electronic medical record. [facility] [employer] is an organization which may serve your needs best as they actually make home visits.

## 2014-12-29 NOTE — Telephone Encounter (Signed)
[name] called in said that pt need his buPROPion (WELLBUTRIN) 75 MG tablet [[id number]]  . Pt is home bound and not able to get out.  Best number [phone]

## 2015-01-02 ENCOUNTER — Other Ambulatory Visit: Payer: Self-pay | Admitting: Cardiology

## 2015-01-05 DIAGNOSIS — G629 Polyneuropathy, unspecified: Secondary | ICD-10-CM | POA: Diagnosis not present

## 2015-01-05 DIAGNOSIS — R338 Other retention of urine: Secondary | ICD-10-CM | POA: Diagnosis not present

## 2015-01-05 DIAGNOSIS — Z5181 Encounter for therapeutic drug level monitoring: Secondary | ICD-10-CM | POA: Diagnosis not present

## 2015-01-05 DIAGNOSIS — I509 Heart failure, unspecified: Secondary | ICD-10-CM | POA: Diagnosis not present

## 2015-01-05 DIAGNOSIS — Z7901 Long term (current) use of anticoagulants: Secondary | ICD-10-CM | POA: Diagnosis not present

## 2015-01-05 DIAGNOSIS — N319 Neuromuscular dysfunction of bladder, unspecified: Secondary | ICD-10-CM | POA: Diagnosis not present

## 2015-01-05 DIAGNOSIS — I4891 Unspecified atrial fibrillation: Secondary | ICD-10-CM | POA: Diagnosis not present

## 2015-01-05 DIAGNOSIS — I10 Essential (primary) hypertension: Secondary | ICD-10-CM | POA: Diagnosis not present

## 2015-01-05 DIAGNOSIS — N401 Enlarged prostate with lower urinary tract symptoms: Secondary | ICD-10-CM | POA: Diagnosis not present

## 2015-01-05 DIAGNOSIS — Z466 Encounter for fitting and adjustment of urinary device: Secondary | ICD-10-CM | POA: Diagnosis not present

## 2015-01-11 DIAGNOSIS — Z466 Encounter for fitting and adjustment of urinary device: Secondary | ICD-10-CM | POA: Diagnosis not present

## 2015-01-11 DIAGNOSIS — Z7901 Long term (current) use of anticoagulants: Secondary | ICD-10-CM | POA: Diagnosis not present

## 2015-01-11 DIAGNOSIS — G629 Polyneuropathy, unspecified: Secondary | ICD-10-CM | POA: Diagnosis not present

## 2015-01-11 DIAGNOSIS — N401 Enlarged prostate with lower urinary tract symptoms: Secondary | ICD-10-CM | POA: Diagnosis not present

## 2015-01-11 DIAGNOSIS — R338 Other retention of urine: Secondary | ICD-10-CM | POA: Diagnosis not present

## 2015-01-11 DIAGNOSIS — I509 Heart failure, unspecified: Secondary | ICD-10-CM | POA: Diagnosis not present

## 2015-01-11 DIAGNOSIS — Z5181 Encounter for therapeutic drug level monitoring: Secondary | ICD-10-CM | POA: Diagnosis not present

## 2015-01-11 DIAGNOSIS — N319 Neuromuscular dysfunction of bladder, unspecified: Secondary | ICD-10-CM | POA: Diagnosis not present

## 2015-01-11 DIAGNOSIS — I10 Essential (primary) hypertension: Secondary | ICD-10-CM | POA: Diagnosis not present

## 2015-01-11 DIAGNOSIS — I4891 Unspecified atrial fibrillation: Secondary | ICD-10-CM | POA: Diagnosis not present

## 2015-01-12 ENCOUNTER — Ambulatory Visit (INDEPENDENT_AMBULATORY_CARE_PROVIDER_SITE_OTHER): Payer: Medicare Other | Admitting: Interventional Cardiology

## 2015-01-12 DIAGNOSIS — N401 Enlarged prostate with lower urinary tract symptoms: Secondary | ICD-10-CM | POA: Diagnosis not present

## 2015-01-12 DIAGNOSIS — I509 Heart failure, unspecified: Secondary | ICD-10-CM | POA: Diagnosis not present

## 2015-01-12 DIAGNOSIS — N319 Neuromuscular dysfunction of bladder, unspecified: Secondary | ICD-10-CM | POA: Diagnosis not present

## 2015-01-12 DIAGNOSIS — R338 Other retention of urine: Secondary | ICD-10-CM | POA: Diagnosis not present

## 2015-01-12 DIAGNOSIS — I4891 Unspecified atrial fibrillation: Secondary | ICD-10-CM

## 2015-01-12 DIAGNOSIS — Z466 Encounter for fitting and adjustment of urinary device: Secondary | ICD-10-CM | POA: Diagnosis not present

## 2015-01-12 DIAGNOSIS — I10 Essential (primary) hypertension: Secondary | ICD-10-CM | POA: Diagnosis not present

## 2015-01-12 DIAGNOSIS — Z5181 Encounter for therapeutic drug level monitoring: Secondary | ICD-10-CM | POA: Diagnosis not present

## 2015-01-12 DIAGNOSIS — Z7901 Long term (current) use of anticoagulants: Secondary | ICD-10-CM | POA: Diagnosis not present

## 2015-01-12 DIAGNOSIS — G629 Polyneuropathy, unspecified: Secondary | ICD-10-CM | POA: Diagnosis not present

## 2015-01-12 LAB — POCT INR: INR: 2.1

## 2015-01-17 ENCOUNTER — Other Ambulatory Visit: Payer: Self-pay | Admitting: Cardiology

## 2015-01-20 DIAGNOSIS — Z5181 Encounter for therapeutic drug level monitoring: Secondary | ICD-10-CM | POA: Diagnosis not present

## 2015-01-20 DIAGNOSIS — Z7901 Long term (current) use of anticoagulants: Secondary | ICD-10-CM | POA: Diagnosis not present

## 2015-01-20 DIAGNOSIS — I509 Heart failure, unspecified: Secondary | ICD-10-CM | POA: Diagnosis not present

## 2015-01-20 DIAGNOSIS — R338 Other retention of urine: Secondary | ICD-10-CM | POA: Diagnosis not present

## 2015-01-20 DIAGNOSIS — I10 Essential (primary) hypertension: Secondary | ICD-10-CM | POA: Diagnosis not present

## 2015-01-20 DIAGNOSIS — G629 Polyneuropathy, unspecified: Secondary | ICD-10-CM | POA: Diagnosis not present

## 2015-01-20 DIAGNOSIS — N319 Neuromuscular dysfunction of bladder, unspecified: Secondary | ICD-10-CM | POA: Diagnosis not present

## 2015-01-20 DIAGNOSIS — N401 Enlarged prostate with lower urinary tract symptoms: Secondary | ICD-10-CM | POA: Diagnosis not present

## 2015-01-20 DIAGNOSIS — I4891 Unspecified atrial fibrillation: Secondary | ICD-10-CM | POA: Diagnosis not present

## 2015-01-20 DIAGNOSIS — Z466 Encounter for fitting and adjustment of urinary device: Secondary | ICD-10-CM | POA: Diagnosis not present

## 2015-01-21 ENCOUNTER — Other Ambulatory Visit: Payer: Self-pay | Admitting: Cardiology

## 2015-01-22 ENCOUNTER — Telehealth: Payer: Self-pay | Admitting: Internal Medicine

## 2015-01-22 NOTE — Telephone Encounter (Signed)
OK 

## 2015-01-22 NOTE — Telephone Encounter (Signed)
Verbal authorization given to [facility]

## 2015-01-22 NOTE — Telephone Encounter (Signed)
Verbal authorization ok?

## 2015-01-22 NOTE — Telephone Encounter (Signed)
Would like resumption of home care order for wound on bottom.

## 2015-01-24 ENCOUNTER — Other Ambulatory Visit: Payer: Self-pay | Admitting: Internal Medicine

## 2015-01-25 ENCOUNTER — Telehealth: Payer: Self-pay | Admitting: Internal Medicine

## 2015-01-25 NOTE — Telephone Encounter (Signed)
States patient only has one pill left

## 2015-01-25 NOTE — Telephone Encounter (Signed)
Requesting a refill of clonazePAM (KLONOPIN) 0.5 MG tablet [[id number]]

## 2015-01-26 ENCOUNTER — Other Ambulatory Visit: Payer: Self-pay

## 2015-01-26 DIAGNOSIS — R338 Other retention of urine: Secondary | ICD-10-CM | POA: Diagnosis not present

## 2015-01-26 DIAGNOSIS — Z7901 Long term (current) use of anticoagulants: Secondary | ICD-10-CM | POA: Diagnosis not present

## 2015-01-26 DIAGNOSIS — N319 Neuromuscular dysfunction of bladder, unspecified: Secondary | ICD-10-CM | POA: Diagnosis not present

## 2015-01-26 DIAGNOSIS — I10 Essential (primary) hypertension: Secondary | ICD-10-CM | POA: Diagnosis not present

## 2015-01-26 DIAGNOSIS — I509 Heart failure, unspecified: Secondary | ICD-10-CM | POA: Diagnosis not present

## 2015-01-26 DIAGNOSIS — Z5181 Encounter for therapeutic drug level monitoring: Secondary | ICD-10-CM | POA: Diagnosis not present

## 2015-01-26 DIAGNOSIS — Z466 Encounter for fitting and adjustment of urinary device: Secondary | ICD-10-CM | POA: Diagnosis not present

## 2015-01-26 DIAGNOSIS — G629 Polyneuropathy, unspecified: Secondary | ICD-10-CM | POA: Diagnosis not present

## 2015-01-26 DIAGNOSIS — N401 Enlarged prostate with lower urinary tract symptoms: Secondary | ICD-10-CM | POA: Diagnosis not present

## 2015-01-26 DIAGNOSIS — I4891 Unspecified atrial fibrillation: Secondary | ICD-10-CM | POA: Diagnosis not present

## 2015-01-26 MED ORDER — CLONAZEPAM 0.5 MG PO TABS: ORAL_TABLET | ORAL | Status: DC

## 2015-01-26 NOTE — Telephone Encounter (Signed)
Are you ok with refilling this---please advise, thanks

## 2015-01-26 NOTE — Telephone Encounter (Signed)
OK # 30  1/2 bid prn

## 2015-01-26 NOTE — Telephone Encounter (Signed)
clonazepem rx sent to pharm

## 2015-01-26 NOTE — Telephone Encounter (Signed)
Patient calling again about the authorization

## 2015-01-26 NOTE — Telephone Encounter (Signed)
Patient needing an authorization for medication listed below. [facility] [address]

## 2015-02-02 DIAGNOSIS — Z466 Encounter for fitting and adjustment of urinary device: Secondary | ICD-10-CM | POA: Diagnosis not present

## 2015-02-02 DIAGNOSIS — I509 Heart failure, unspecified: Secondary | ICD-10-CM | POA: Diagnosis not present

## 2015-02-02 DIAGNOSIS — R338 Other retention of urine: Secondary | ICD-10-CM | POA: Diagnosis not present

## 2015-02-02 DIAGNOSIS — I10 Essential (primary) hypertension: Secondary | ICD-10-CM | POA: Diagnosis not present

## 2015-02-02 DIAGNOSIS — N319 Neuromuscular dysfunction of bladder, unspecified: Secondary | ICD-10-CM | POA: Diagnosis not present

## 2015-02-02 DIAGNOSIS — G629 Polyneuropathy, unspecified: Secondary | ICD-10-CM | POA: Diagnosis not present

## 2015-02-02 DIAGNOSIS — N401 Enlarged prostate with lower urinary tract symptoms: Secondary | ICD-10-CM | POA: Diagnosis not present

## 2015-02-02 DIAGNOSIS — I4891 Unspecified atrial fibrillation: Secondary | ICD-10-CM | POA: Diagnosis not present

## 2015-02-02 DIAGNOSIS — Z7901 Long term (current) use of anticoagulants: Secondary | ICD-10-CM | POA: Diagnosis not present

## 2015-02-02 DIAGNOSIS — Z5181 Encounter for therapeutic drug level monitoring: Secondary | ICD-10-CM | POA: Diagnosis not present

## 2015-02-05 DIAGNOSIS — Z7901 Long term (current) use of anticoagulants: Secondary | ICD-10-CM | POA: Diagnosis not present

## 2015-02-05 DIAGNOSIS — N401 Enlarged prostate with lower urinary tract symptoms: Secondary | ICD-10-CM | POA: Diagnosis not present

## 2015-02-05 DIAGNOSIS — I10 Essential (primary) hypertension: Secondary | ICD-10-CM | POA: Diagnosis not present

## 2015-02-05 DIAGNOSIS — G629 Polyneuropathy, unspecified: Secondary | ICD-10-CM | POA: Diagnosis not present

## 2015-02-05 DIAGNOSIS — N319 Neuromuscular dysfunction of bladder, unspecified: Secondary | ICD-10-CM | POA: Diagnosis not present

## 2015-02-05 DIAGNOSIS — I4891 Unspecified atrial fibrillation: Secondary | ICD-10-CM | POA: Diagnosis not present

## 2015-02-05 DIAGNOSIS — Z466 Encounter for fitting and adjustment of urinary device: Secondary | ICD-10-CM | POA: Diagnosis not present

## 2015-02-05 DIAGNOSIS — Z5181 Encounter for therapeutic drug level monitoring: Secondary | ICD-10-CM | POA: Diagnosis not present

## 2015-02-05 DIAGNOSIS — R338 Other retention of urine: Secondary | ICD-10-CM | POA: Diagnosis not present

## 2015-02-05 DIAGNOSIS — I509 Heart failure, unspecified: Secondary | ICD-10-CM | POA: Diagnosis not present

## 2015-02-09 ENCOUNTER — Ambulatory Visit (INDEPENDENT_AMBULATORY_CARE_PROVIDER_SITE_OTHER): Payer: Medicare Other | Admitting: Cardiovascular Disease

## 2015-02-09 ENCOUNTER — Other Ambulatory Visit: Payer: Self-pay | Admitting: Cardiology

## 2015-02-09 DIAGNOSIS — I10 Essential (primary) hypertension: Secondary | ICD-10-CM | POA: Diagnosis not present

## 2015-02-09 DIAGNOSIS — I4891 Unspecified atrial fibrillation: Secondary | ICD-10-CM

## 2015-02-09 DIAGNOSIS — R338 Other retention of urine: Secondary | ICD-10-CM | POA: Diagnosis not present

## 2015-02-09 DIAGNOSIS — Z5181 Encounter for therapeutic drug level monitoring: Secondary | ICD-10-CM | POA: Diagnosis not present

## 2015-02-09 DIAGNOSIS — N401 Enlarged prostate with lower urinary tract symptoms: Secondary | ICD-10-CM | POA: Diagnosis not present

## 2015-02-09 DIAGNOSIS — G629 Polyneuropathy, unspecified: Secondary | ICD-10-CM | POA: Diagnosis not present

## 2015-02-09 DIAGNOSIS — Z466 Encounter for fitting and adjustment of urinary device: Secondary | ICD-10-CM | POA: Diagnosis not present

## 2015-02-09 DIAGNOSIS — Z7901 Long term (current) use of anticoagulants: Secondary | ICD-10-CM | POA: Diagnosis not present

## 2015-02-09 DIAGNOSIS — I509 Heart failure, unspecified: Secondary | ICD-10-CM | POA: Diagnosis not present

## 2015-02-09 DIAGNOSIS — N319 Neuromuscular dysfunction of bladder, unspecified: Secondary | ICD-10-CM | POA: Diagnosis not present

## 2015-02-09 LAB — POCT INR: INR: 2.2

## 2015-02-16 ENCOUNTER — Other Ambulatory Visit: Payer: Self-pay | Admitting: Cardiology

## 2015-02-17 ENCOUNTER — Telehealth: Payer: Self-pay | Admitting: Emergency Medicine

## 2015-02-17 NOTE — Telephone Encounter (Signed)
Can't be refilled until 7/1;Rx should have lasted until 7/7  My retirement date is 08/21/2015; but I will be in office only T, [place] during the months Oct-Dec.You should transition your care to another PCP by Oct 1,2016.

## 2015-02-17 NOTE — Telephone Encounter (Signed)
RX refill from pharm for Clonazepam, Last OV 05/15. Last refill 01/26/15 please advise

## 2015-02-19 ENCOUNTER — Telehealth: Payer: Self-pay | Admitting: Internal Medicine

## 2015-02-19 ENCOUNTER — Other Ambulatory Visit: Payer: Self-pay | Admitting: Emergency Medicine

## 2015-02-19 MED ORDER — CLONAZEPAM 0.5 MG PO TABS: ORAL_TABLET | ORAL | Status: DC

## 2015-02-19 NOTE — Telephone Encounter (Signed)
Refill for clonazePAM (KLONOPIN) 0.5 MG tablet [[id number] needs pre authorization. [facility] [address]

## 2015-02-19 NOTE — Telephone Encounter (Signed)
OK X1  My retirement date is 08/21/2015; but I will be in office only T, Weds & Thurs during the months Oct-Dec.You should transition your care to another PCP by Oct 1,2016.   

## 2015-02-19 NOTE — Telephone Encounter (Signed)
Please advise, last OV was 05/15

## 2015-02-22 ENCOUNTER — Other Ambulatory Visit: Payer: Self-pay | Admitting: Internal Medicine

## 2015-02-22 DIAGNOSIS — G629 Polyneuropathy, unspecified: Secondary | ICD-10-CM | POA: Diagnosis not present

## 2015-02-22 DIAGNOSIS — N401 Enlarged prostate with lower urinary tract symptoms: Secondary | ICD-10-CM | POA: Diagnosis not present

## 2015-02-22 DIAGNOSIS — Z5181 Encounter for therapeutic drug level monitoring: Secondary | ICD-10-CM | POA: Diagnosis not present

## 2015-02-22 DIAGNOSIS — Z7901 Long term (current) use of anticoagulants: Secondary | ICD-10-CM | POA: Diagnosis not present

## 2015-02-22 DIAGNOSIS — I509 Heart failure, unspecified: Secondary | ICD-10-CM | POA: Diagnosis not present

## 2015-02-22 DIAGNOSIS — Z466 Encounter for fitting and adjustment of urinary device: Secondary | ICD-10-CM | POA: Diagnosis not present

## 2015-02-22 DIAGNOSIS — R338 Other retention of urine: Secondary | ICD-10-CM | POA: Diagnosis not present

## 2015-02-22 DIAGNOSIS — I10 Essential (primary) hypertension: Secondary | ICD-10-CM | POA: Diagnosis not present

## 2015-02-22 DIAGNOSIS — I4891 Unspecified atrial fibrillation: Secondary | ICD-10-CM | POA: Diagnosis not present

## 2015-02-22 DIAGNOSIS — N319 Neuromuscular dysfunction of bladder, unspecified: Secondary | ICD-10-CM | POA: Diagnosis not present

## 2015-02-23 ENCOUNTER — Other Ambulatory Visit: Payer: Self-pay | Admitting: Emergency Medicine

## 2015-02-23 MED ORDER — BUPROPION HCL 75 MG PO TABS: ORAL_TABLET | ORAL | Status: DC

## 2015-02-23 NOTE — Telephone Encounter (Signed)
Last OV 12/19/13. Please advise

## 2015-02-23 NOTE — Telephone Encounter (Signed)
OK X1  My retirement date is 08/21/2015; but I will be in office only T, Weds & Thurs during the months Oct-Dec.You should transition your care to another PCP by Oct 1,2016.   

## 2015-02-24 ENCOUNTER — Other Ambulatory Visit: Payer: Self-pay | Admitting: Internal Medicine

## 2015-02-24 NOTE — Telephone Encounter (Signed)
Patients last office visit may/2015---please advise, thanks

## 2015-02-24 NOTE — Telephone Encounter (Signed)
Verify already done  My retirement date is 08/21/2015; but I will be in office only T, Weds & Thurs during the months Oct-Dec.You should transition your care to another PCP by Oct 1,2016.

## 2015-03-02 DIAGNOSIS — R338 Other retention of urine: Secondary | ICD-10-CM | POA: Diagnosis not present

## 2015-03-02 DIAGNOSIS — I10 Essential (primary) hypertension: Secondary | ICD-10-CM | POA: Diagnosis not present

## 2015-03-02 DIAGNOSIS — I509 Heart failure, unspecified: Secondary | ICD-10-CM | POA: Diagnosis not present

## 2015-03-02 DIAGNOSIS — Z5181 Encounter for therapeutic drug level monitoring: Secondary | ICD-10-CM | POA: Diagnosis not present

## 2015-03-02 DIAGNOSIS — Z7901 Long term (current) use of anticoagulants: Secondary | ICD-10-CM | POA: Diagnosis not present

## 2015-03-02 DIAGNOSIS — N319 Neuromuscular dysfunction of bladder, unspecified: Secondary | ICD-10-CM | POA: Diagnosis not present

## 2015-03-02 DIAGNOSIS — I4891 Unspecified atrial fibrillation: Secondary | ICD-10-CM | POA: Diagnosis not present

## 2015-03-02 DIAGNOSIS — Z466 Encounter for fitting and adjustment of urinary device: Secondary | ICD-10-CM | POA: Diagnosis not present

## 2015-03-02 DIAGNOSIS — G629 Polyneuropathy, unspecified: Secondary | ICD-10-CM | POA: Diagnosis not present

## 2015-03-02 DIAGNOSIS — N401 Enlarged prostate with lower urinary tract symptoms: Secondary | ICD-10-CM | POA: Diagnosis not present

## 2015-03-05 DIAGNOSIS — N319 Neuromuscular dysfunction of bladder, unspecified: Secondary | ICD-10-CM | POA: Diagnosis not present

## 2015-03-05 DIAGNOSIS — N401 Enlarged prostate with lower urinary tract symptoms: Secondary | ICD-10-CM | POA: Diagnosis not present

## 2015-03-05 DIAGNOSIS — Z5181 Encounter for therapeutic drug level monitoring: Secondary | ICD-10-CM | POA: Diagnosis not present

## 2015-03-05 DIAGNOSIS — Z466 Encounter for fitting and adjustment of urinary device: Secondary | ICD-10-CM | POA: Diagnosis not present

## 2015-03-05 DIAGNOSIS — I10 Essential (primary) hypertension: Secondary | ICD-10-CM | POA: Diagnosis not present

## 2015-03-05 DIAGNOSIS — Z7901 Long term (current) use of anticoagulants: Secondary | ICD-10-CM | POA: Diagnosis not present

## 2015-03-05 DIAGNOSIS — I509 Heart failure, unspecified: Secondary | ICD-10-CM | POA: Diagnosis not present

## 2015-03-05 DIAGNOSIS — I4891 Unspecified atrial fibrillation: Secondary | ICD-10-CM | POA: Diagnosis not present

## 2015-03-05 DIAGNOSIS — G629 Polyneuropathy, unspecified: Secondary | ICD-10-CM | POA: Diagnosis not present

## 2015-03-05 DIAGNOSIS — R338 Other retention of urine: Secondary | ICD-10-CM | POA: Diagnosis not present

## 2015-03-09 ENCOUNTER — Ambulatory Visit (INDEPENDENT_AMBULATORY_CARE_PROVIDER_SITE_OTHER): Payer: Medicare Other | Admitting: Cardiology

## 2015-03-09 DIAGNOSIS — I4891 Unspecified atrial fibrillation: Secondary | ICD-10-CM

## 2015-03-09 DIAGNOSIS — I509 Heart failure, unspecified: Secondary | ICD-10-CM | POA: Diagnosis not present

## 2015-03-09 DIAGNOSIS — Z466 Encounter for fitting and adjustment of urinary device: Secondary | ICD-10-CM | POA: Diagnosis not present

## 2015-03-09 DIAGNOSIS — Z7901 Long term (current) use of anticoagulants: Secondary | ICD-10-CM | POA: Diagnosis not present

## 2015-03-09 DIAGNOSIS — R338 Other retention of urine: Secondary | ICD-10-CM | POA: Diagnosis not present

## 2015-03-09 DIAGNOSIS — I10 Essential (primary) hypertension: Secondary | ICD-10-CM | POA: Diagnosis not present

## 2015-03-09 DIAGNOSIS — G629 Polyneuropathy, unspecified: Secondary | ICD-10-CM | POA: Diagnosis not present

## 2015-03-09 DIAGNOSIS — Z5181 Encounter for therapeutic drug level monitoring: Secondary | ICD-10-CM | POA: Diagnosis not present

## 2015-03-09 DIAGNOSIS — N319 Neuromuscular dysfunction of bladder, unspecified: Secondary | ICD-10-CM | POA: Diagnosis not present

## 2015-03-09 DIAGNOSIS — N401 Enlarged prostate with lower urinary tract symptoms: Secondary | ICD-10-CM | POA: Diagnosis not present

## 2015-03-09 LAB — POCT INR: INR: 2.4

## 2015-03-12 ENCOUNTER — Encounter: Payer: Self-pay | Admitting: Internal Medicine

## 2015-03-16 DIAGNOSIS — G629 Polyneuropathy, unspecified: Secondary | ICD-10-CM | POA: Diagnosis not present

## 2015-03-16 DIAGNOSIS — I4891 Unspecified atrial fibrillation: Secondary | ICD-10-CM | POA: Diagnosis not present

## 2015-03-16 DIAGNOSIS — Z5181 Encounter for therapeutic drug level monitoring: Secondary | ICD-10-CM | POA: Diagnosis not present

## 2015-03-16 DIAGNOSIS — R338 Other retention of urine: Secondary | ICD-10-CM | POA: Diagnosis not present

## 2015-03-16 DIAGNOSIS — N401 Enlarged prostate with lower urinary tract symptoms: Secondary | ICD-10-CM | POA: Diagnosis not present

## 2015-03-16 DIAGNOSIS — Z7901 Long term (current) use of anticoagulants: Secondary | ICD-10-CM | POA: Diagnosis not present

## 2015-03-16 DIAGNOSIS — N319 Neuromuscular dysfunction of bladder, unspecified: Secondary | ICD-10-CM | POA: Diagnosis not present

## 2015-03-16 DIAGNOSIS — I10 Essential (primary) hypertension: Secondary | ICD-10-CM | POA: Diagnosis not present

## 2015-03-16 DIAGNOSIS — Z466 Encounter for fitting and adjustment of urinary device: Secondary | ICD-10-CM | POA: Diagnosis not present

## 2015-03-16 DIAGNOSIS — I509 Heart failure, unspecified: Secondary | ICD-10-CM | POA: Diagnosis not present

## 2015-03-23 ENCOUNTER — Other Ambulatory Visit: Payer: Self-pay | Admitting: Internal Medicine

## 2015-03-23 DIAGNOSIS — G629 Polyneuropathy, unspecified: Secondary | ICD-10-CM | POA: Diagnosis not present

## 2015-03-23 DIAGNOSIS — I10 Essential (primary) hypertension: Secondary | ICD-10-CM | POA: Diagnosis not present

## 2015-03-23 DIAGNOSIS — Z7901 Long term (current) use of anticoagulants: Secondary | ICD-10-CM | POA: Diagnosis not present

## 2015-03-23 DIAGNOSIS — N401 Enlarged prostate with lower urinary tract symptoms: Secondary | ICD-10-CM | POA: Diagnosis not present

## 2015-03-23 DIAGNOSIS — I4891 Unspecified atrial fibrillation: Secondary | ICD-10-CM | POA: Diagnosis not present

## 2015-03-23 DIAGNOSIS — Z5181 Encounter for therapeutic drug level monitoring: Secondary | ICD-10-CM | POA: Diagnosis not present

## 2015-03-23 DIAGNOSIS — I509 Heart failure, unspecified: Secondary | ICD-10-CM | POA: Diagnosis not present

## 2015-03-23 DIAGNOSIS — Z466 Encounter for fitting and adjustment of urinary device: Secondary | ICD-10-CM | POA: Diagnosis not present

## 2015-03-23 DIAGNOSIS — R338 Other retention of urine: Secondary | ICD-10-CM | POA: Diagnosis not present

## 2015-03-23 DIAGNOSIS — N319 Neuromuscular dysfunction of bladder, unspecified: Secondary | ICD-10-CM | POA: Diagnosis not present

## 2015-03-24 ENCOUNTER — Telehealth: Payer: Self-pay | Admitting: Internal Medicine

## 2015-03-24 NOTE — Telephone Encounter (Signed)
Requesting orders to continue care for every other week for next 9 weeks.  Has reminded patient that since Dr. Linna Darner is retiring patient needs to establish care with another provider.  Patient is homebound so do not know how patient will be able to establish care with another provider.

## 2015-03-24 NOTE — Telephone Encounter (Signed)
OK   There is a medical group called [facility]; these physicians and their staff make home visits.  After October 1, I will not have regular office hours and will not be able to continue to complete records and fill maintenance medications.

## 2015-03-24 NOTE — Telephone Encounter (Signed)
Please advise, thanks.

## 2015-03-25 ENCOUNTER — Other Ambulatory Visit: Payer: Self-pay | Admitting: Emergency Medicine

## 2015-03-25 ENCOUNTER — Telehealth: Payer: Self-pay | Admitting: Emergency Medicine

## 2015-03-25 MED ORDER — CLONAZEPAM 0.5 MG PO TABS: ORAL_TABLET | ORAL | Status: DC

## 2015-03-25 NOTE — Telephone Encounter (Signed)
Refill Request for Clonazepam, last OV 5/15. Please advise

## 2015-03-25 NOTE — Telephone Encounter (Signed)
OK X1 

## 2015-03-30 NOTE — Telephone Encounter (Signed)
i talked with scott/home care--gave orders for continuing care until October 1st per dr hoppers note---emphasized need for patient to transition over to medical group '[facility]'--scott stated patient wants to stay with [facility], and have Dr Quay Burow take over his care after dr hopper leaves--i explained office policy that patient has to establish care with new provider and if he is unable to come here, it will be better patient care to transition over to provider that can establish care with a home visit ([facility]),  Nicki Reaper stated he has explained this before to patient but will re-state again to patient at his next visit with patient tomorrow in the home

## 2015-04-03 DIAGNOSIS — G629 Polyneuropathy, unspecified: Secondary | ICD-10-CM | POA: Diagnosis not present

## 2015-04-03 DIAGNOSIS — R338 Other retention of urine: Secondary | ICD-10-CM | POA: Diagnosis not present

## 2015-04-03 DIAGNOSIS — Z5181 Encounter for therapeutic drug level monitoring: Secondary | ICD-10-CM | POA: Diagnosis not present

## 2015-04-03 DIAGNOSIS — I10 Essential (primary) hypertension: Secondary | ICD-10-CM | POA: Diagnosis not present

## 2015-04-03 DIAGNOSIS — I509 Heart failure, unspecified: Secondary | ICD-10-CM | POA: Diagnosis not present

## 2015-04-03 DIAGNOSIS — I4891 Unspecified atrial fibrillation: Secondary | ICD-10-CM | POA: Diagnosis not present

## 2015-04-03 DIAGNOSIS — Z7901 Long term (current) use of anticoagulants: Secondary | ICD-10-CM | POA: Diagnosis not present

## 2015-04-03 DIAGNOSIS — N319 Neuromuscular dysfunction of bladder, unspecified: Secondary | ICD-10-CM | POA: Diagnosis not present

## 2015-04-03 DIAGNOSIS — N401 Enlarged prostate with lower urinary tract symptoms: Secondary | ICD-10-CM | POA: Diagnosis not present

## 2015-04-03 DIAGNOSIS — Z466 Encounter for fitting and adjustment of urinary device: Secondary | ICD-10-CM | POA: Diagnosis not present

## 2015-04-05 ENCOUNTER — Telehealth: Payer: Self-pay | Admitting: Internal Medicine

## 2015-04-05 NOTE — Telephone Encounter (Signed)
[name] from [place] calling on behalf of [facility] is wanting to know if you received a fax for a physician queary that was sent over last week Can you call her at [phone] and let her know if you received it.

## 2015-04-06 ENCOUNTER — Ambulatory Visit (INDEPENDENT_AMBULATORY_CARE_PROVIDER_SITE_OTHER): Payer: Medicare Other | Admitting: Cardiology

## 2015-04-06 DIAGNOSIS — G629 Polyneuropathy, unspecified: Secondary | ICD-10-CM | POA: Diagnosis not present

## 2015-04-06 DIAGNOSIS — Z5181 Encounter for therapeutic drug level monitoring: Secondary | ICD-10-CM | POA: Diagnosis not present

## 2015-04-06 DIAGNOSIS — I10 Essential (primary) hypertension: Secondary | ICD-10-CM | POA: Diagnosis not present

## 2015-04-06 DIAGNOSIS — Z7901 Long term (current) use of anticoagulants: Secondary | ICD-10-CM | POA: Diagnosis not present

## 2015-04-06 DIAGNOSIS — Z466 Encounter for fitting and adjustment of urinary device: Secondary | ICD-10-CM | POA: Diagnosis not present

## 2015-04-06 DIAGNOSIS — N319 Neuromuscular dysfunction of bladder, unspecified: Secondary | ICD-10-CM | POA: Diagnosis not present

## 2015-04-06 DIAGNOSIS — I509 Heart failure, unspecified: Secondary | ICD-10-CM | POA: Diagnosis not present

## 2015-04-06 DIAGNOSIS — I4891 Unspecified atrial fibrillation: Secondary | ICD-10-CM | POA: Diagnosis not present

## 2015-04-06 DIAGNOSIS — R338 Other retention of urine: Secondary | ICD-10-CM | POA: Diagnosis not present

## 2015-04-06 DIAGNOSIS — N401 Enlarged prostate with lower urinary tract symptoms: Secondary | ICD-10-CM | POA: Diagnosis not present

## 2015-04-06 LAB — POCT INR: INR: 2.7

## 2015-04-09 DIAGNOSIS — N319 Neuromuscular dysfunction of bladder, unspecified: Secondary | ICD-10-CM | POA: Diagnosis not present

## 2015-04-09 NOTE — Telephone Encounter (Signed)
LVM for [name] to call back.

## 2015-04-13 ENCOUNTER — Other Ambulatory Visit: Payer: Self-pay | Admitting: Cardiology

## 2015-04-14 ENCOUNTER — Other Ambulatory Visit: Payer: Self-pay | Admitting: Cardiology

## 2015-04-14 NOTE — Telephone Encounter (Signed)
Faxed forms to insurance 04/13/15

## 2015-04-16 ENCOUNTER — Telehealth: Payer: Self-pay | Admitting: Cardiology

## 2015-04-16 NOTE — Telephone Encounter (Signed)
[name] advised I do not see DPR on file, transferred to medical records to facilitate completing DPR. I called pt at  phone number listed for pt, call answered by a sitter that states pt is asleep.

## 2015-04-16 NOTE — Telephone Encounter (Signed)
New Prob   Pts daughter requesting to speak to nurse regarding his inability to follow up. States pt is home bound and can not get to the office. [name] is currently out of his medications and was told he can not get any refills until he comes in for follow up. Please advise.

## 2015-04-19 ENCOUNTER — Telehealth: Payer: Self-pay

## 2015-04-19 ENCOUNTER — Telehealth: Payer: Self-pay | Admitting: Cardiology

## 2015-04-19 MED ORDER — AMIODARONE HCL 200 MG PO TABS: 100.0000 mg | ORAL_TABLET | Freq: Every day | ORAL | Status: AC

## 2015-04-19 MED ORDER — FUROSEMIDE 40 MG PO TABS: 40.0000 mg | ORAL_TABLET | ORAL | Status: DC

## 2015-04-19 MED ORDER — PRAVASTATIN SODIUM 80 MG PO TABS: 80.0000 mg | ORAL_TABLET | Freq: Every day | ORAL | Status: DC

## 2015-04-19 NOTE — Telephone Encounter (Signed)
Try [facility]

## 2015-04-19 NOTE — Telephone Encounter (Signed)
Patient's daughter calls about refilling his Rx here. Has not been here in a year, but is home bound: [facility] for INR's and HH. She states he can't get in here because a transportation Co requires a ramp, which they don't have. Uses a walker and they have many stairs to maneuver. I could consult with [facility], but want your thoughts on this.

## 2015-04-19 NOTE — Telephone Encounter (Addendum)
error 

## 2015-04-20 ENCOUNTER — Other Ambulatory Visit: Payer: Self-pay | Admitting: Internal Medicine

## 2015-04-20 DIAGNOSIS — I10 Essential (primary) hypertension: Secondary | ICD-10-CM | POA: Diagnosis not present

## 2015-04-20 DIAGNOSIS — Z5181 Encounter for therapeutic drug level monitoring: Secondary | ICD-10-CM | POA: Diagnosis not present

## 2015-04-20 DIAGNOSIS — Z7901 Long term (current) use of anticoagulants: Secondary | ICD-10-CM | POA: Diagnosis not present

## 2015-04-20 DIAGNOSIS — I509 Heart failure, unspecified: Secondary | ICD-10-CM | POA: Diagnosis not present

## 2015-04-20 DIAGNOSIS — N319 Neuromuscular dysfunction of bladder, unspecified: Secondary | ICD-10-CM | POA: Diagnosis not present

## 2015-04-20 DIAGNOSIS — R338 Other retention of urine: Secondary | ICD-10-CM | POA: Diagnosis not present

## 2015-04-20 DIAGNOSIS — Z466 Encounter for fitting and adjustment of urinary device: Secondary | ICD-10-CM | POA: Diagnosis not present

## 2015-04-20 DIAGNOSIS — I4891 Unspecified atrial fibrillation: Secondary | ICD-10-CM | POA: Diagnosis not present

## 2015-04-20 DIAGNOSIS — G629 Polyneuropathy, unspecified: Secondary | ICD-10-CM | POA: Diagnosis not present

## 2015-04-20 DIAGNOSIS — N401 Enlarged prostate with lower urinary tract symptoms: Secondary | ICD-10-CM | POA: Diagnosis not present

## 2015-04-21 ENCOUNTER — Telehealth: Payer: Self-pay | Admitting: Emergency Medicine

## 2015-04-27 MED ORDER — CLONAZEPAM 0.5 MG PO TABS: ORAL_TABLET | ORAL | Status: DC

## 2015-04-27 NOTE — Telephone Encounter (Signed)
Left msg on triage stating dad is needing refill on his clonazepam. Also they know md is retiring parents are homebound wanting md to recommend md that does home visit...Johny Chess

## 2015-04-27 NOTE — Telephone Encounter (Signed)
[facility] is such an organization OK for Clonazepam   My retirement date is 08/21/2015; but I will be in office on a limited schedule Oct-Dec. To guarantee continuity of care you should transition your care to another PCP by Oct 1,2016.

## 2015-04-27 NOTE — Telephone Encounter (Signed)
Called daughter back no answer left md response, and 2 additional organizations that makes house calls [facility] # [phone], House doctors # [phone], and [facility] @ [phone]...[name]

## 2015-04-27 NOTE — Telephone Encounter (Signed)
Not[facility], she is still inquiring about physician making home visit Dr. Linna Darner do you know any md that makes home visits. She states they are not able to come to office...[name]

## 2015-04-27 NOTE — Telephone Encounter (Signed)
Lorre Nick, the name of group is [facility]

## 2015-05-04 ENCOUNTER — Ambulatory Visit (INDEPENDENT_AMBULATORY_CARE_PROVIDER_SITE_OTHER): Payer: Medicare Other | Admitting: Internal Medicine

## 2015-05-04 DIAGNOSIS — R338 Other retention of urine: Secondary | ICD-10-CM | POA: Diagnosis not present

## 2015-05-04 DIAGNOSIS — N401 Enlarged prostate with lower urinary tract symptoms: Secondary | ICD-10-CM | POA: Diagnosis not present

## 2015-05-04 DIAGNOSIS — N319 Neuromuscular dysfunction of bladder, unspecified: Secondary | ICD-10-CM | POA: Diagnosis not present

## 2015-05-04 DIAGNOSIS — I4891 Unspecified atrial fibrillation: Secondary | ICD-10-CM

## 2015-05-04 DIAGNOSIS — I10 Essential (primary) hypertension: Secondary | ICD-10-CM | POA: Diagnosis not present

## 2015-05-04 DIAGNOSIS — Z466 Encounter for fitting and adjustment of urinary device: Secondary | ICD-10-CM | POA: Diagnosis not present

## 2015-05-04 DIAGNOSIS — G629 Polyneuropathy, unspecified: Secondary | ICD-10-CM | POA: Diagnosis not present

## 2015-05-04 DIAGNOSIS — Z5181 Encounter for therapeutic drug level monitoring: Secondary | ICD-10-CM | POA: Diagnosis not present

## 2015-05-04 DIAGNOSIS — Z7901 Long term (current) use of anticoagulants: Secondary | ICD-10-CM | POA: Diagnosis not present

## 2015-05-04 DIAGNOSIS — I509 Heart failure, unspecified: Secondary | ICD-10-CM | POA: Diagnosis not present

## 2015-05-04 LAB — POCT INR: INR: 2.6

## 2015-05-11 ENCOUNTER — Telehealth: Payer: Self-pay | Admitting: Internal Medicine

## 2015-05-11 NOTE — Telephone Encounter (Signed)
Please advise 

## 2015-05-11 NOTE — Telephone Encounter (Signed)
[name] from [name] called with concerns about his home health care after the first of October. She is under the impression [name] won't be able to sign off on orders after then.  Can you please call her at [phone] to go over this with her.

## 2015-05-12 ENCOUNTER — Encounter: Payer: Self-pay | Admitting: Internal Medicine

## 2015-05-14 ENCOUNTER — Other Ambulatory Visit: Payer: Self-pay | Admitting: Internal Medicine

## 2015-05-17 NOTE — Telephone Encounter (Signed)
Patients last office visit may,2015---please advise, thanks 

## 2015-05-17 NOTE — Telephone Encounter (Signed)
Last refill w/o physician follow up

## 2015-05-18 DIAGNOSIS — Z5181 Encounter for therapeutic drug level monitoring: Secondary | ICD-10-CM | POA: Diagnosis not present

## 2015-05-18 DIAGNOSIS — I509 Heart failure, unspecified: Secondary | ICD-10-CM | POA: Diagnosis not present

## 2015-05-18 DIAGNOSIS — R338 Other retention of urine: Secondary | ICD-10-CM | POA: Diagnosis not present

## 2015-05-18 DIAGNOSIS — Z466 Encounter for fitting and adjustment of urinary device: Secondary | ICD-10-CM | POA: Diagnosis not present

## 2015-05-18 DIAGNOSIS — I10 Essential (primary) hypertension: Secondary | ICD-10-CM | POA: Diagnosis not present

## 2015-05-18 DIAGNOSIS — Z7901 Long term (current) use of anticoagulants: Secondary | ICD-10-CM | POA: Diagnosis not present

## 2015-05-18 DIAGNOSIS — N401 Enlarged prostate with lower urinary tract symptoms: Secondary | ICD-10-CM | POA: Diagnosis not present

## 2015-05-18 DIAGNOSIS — I4891 Unspecified atrial fibrillation: Secondary | ICD-10-CM | POA: Diagnosis not present

## 2015-05-18 DIAGNOSIS — G629 Polyneuropathy, unspecified: Secondary | ICD-10-CM | POA: Diagnosis not present

## 2015-05-18 DIAGNOSIS — N319 Neuromuscular dysfunction of bladder, unspecified: Secondary | ICD-10-CM | POA: Diagnosis not present

## 2015-05-19 DIAGNOSIS — R339 Retention of urine, unspecified: Secondary | ICD-10-CM | POA: Diagnosis not present

## 2015-05-19 DIAGNOSIS — E784 Other hyperlipidemia: Secondary | ICD-10-CM | POA: Diagnosis not present

## 2015-05-19 DIAGNOSIS — I10 Essential (primary) hypertension: Secondary | ICD-10-CM | POA: Diagnosis not present

## 2015-05-27 DIAGNOSIS — Z79899 Other long term (current) drug therapy: Secondary | ICD-10-CM | POA: Diagnosis not present

## 2015-05-28 ENCOUNTER — Telehealth: Payer: Self-pay | Admitting: Internal Medicine

## 2015-05-28 ENCOUNTER — Other Ambulatory Visit: Payer: Self-pay | Admitting: Emergency Medicine

## 2015-05-28 MED ORDER — CLONAZEPAM 0.5 MG PO TABS: ORAL_TABLET | ORAL | Status: DC

## 2015-05-28 NOTE — Telephone Encounter (Signed)
Patient Name: [name]  DOB: 29-Mar-1925    Initial Comment Caller States phil w/ [facility] has been trying to reach the office for 2 days and no response to request a refill. Facility Call Back # [phone]   Nurse Assessment  Nurse: Mallie Mussel, RN, Alveta Heimlich Date/Time Eilene Ghazi Time): 05/28/2015 12:33:48 PM  Please select the assessment type ---Refill  Additional Documentation ---Per Abbe Amsterdam, the patient needs a refill of his Clonazepam 0.5mg  1/2 Tablet PO BID PRN. He normally gets 30 at a time.  Does the patient have enough medication to last until the office opens? ---Unable to obtain loaner dose from Pharmacy  Does the client directives allow for assistance with medications after hours? ---No  Additional Documentation ---Advised him that the office is in a staff meeting until 1:00pm. I will forward the request to the office for him. He verbalized understanding.     Guidelines    Guideline Title Affirmed Question Affirmed Notes       Final Disposition User

## 2015-06-01 ENCOUNTER — Ambulatory Visit (INDEPENDENT_AMBULATORY_CARE_PROVIDER_SITE_OTHER): Payer: Medicare Other | Admitting: Cardiovascular Disease

## 2015-06-01 DIAGNOSIS — N3289 Other specified disorders of bladder: Secondary | ICD-10-CM | POA: Diagnosis not present

## 2015-06-01 DIAGNOSIS — N401 Enlarged prostate with lower urinary tract symptoms: Secondary | ICD-10-CM | POA: Diagnosis not present

## 2015-06-01 DIAGNOSIS — I4891 Unspecified atrial fibrillation: Secondary | ICD-10-CM | POA: Diagnosis not present

## 2015-06-01 DIAGNOSIS — I110 Hypertensive heart disease with heart failure: Secondary | ICD-10-CM | POA: Diagnosis not present

## 2015-06-01 DIAGNOSIS — R338 Other retention of urine: Secondary | ICD-10-CM | POA: Diagnosis not present

## 2015-06-01 DIAGNOSIS — Z466 Encounter for fitting and adjustment of urinary device: Secondary | ICD-10-CM | POA: Diagnosis not present

## 2015-06-01 DIAGNOSIS — Z5181 Encounter for therapeutic drug level monitoring: Secondary | ICD-10-CM | POA: Diagnosis not present

## 2015-06-01 DIAGNOSIS — I509 Heart failure, unspecified: Secondary | ICD-10-CM | POA: Diagnosis not present

## 2015-06-01 DIAGNOSIS — Z7901 Long term (current) use of anticoagulants: Secondary | ICD-10-CM | POA: Diagnosis not present

## 2015-06-01 DIAGNOSIS — N319 Neuromuscular dysfunction of bladder, unspecified: Secondary | ICD-10-CM | POA: Diagnosis not present

## 2015-06-01 LAB — POCT INR: INR: 2.7

## 2015-06-09 ENCOUNTER — Other Ambulatory Visit: Payer: Self-pay | Admitting: Internal Medicine

## 2015-06-10 ENCOUNTER — Other Ambulatory Visit: Payer: Self-pay | Admitting: Emergency Medicine

## 2015-06-10 MED ORDER — BUPROPION HCL 75 MG PO TABS: 75.0000 mg | ORAL_TABLET | Freq: Two times a day (BID) | ORAL | Status: AC

## 2015-06-14 DIAGNOSIS — N319 Neuromuscular dysfunction of bladder, unspecified: Secondary | ICD-10-CM | POA: Diagnosis not present

## 2015-06-14 DIAGNOSIS — Z5181 Encounter for therapeutic drug level monitoring: Secondary | ICD-10-CM | POA: Diagnosis not present

## 2015-06-14 DIAGNOSIS — N401 Enlarged prostate with lower urinary tract symptoms: Secondary | ICD-10-CM | POA: Diagnosis not present

## 2015-06-14 DIAGNOSIS — N3289 Other specified disorders of bladder: Secondary | ICD-10-CM | POA: Diagnosis not present

## 2015-06-14 DIAGNOSIS — I509 Heart failure, unspecified: Secondary | ICD-10-CM | POA: Diagnosis not present

## 2015-06-14 DIAGNOSIS — Z466 Encounter for fitting and adjustment of urinary device: Secondary | ICD-10-CM | POA: Diagnosis not present

## 2015-06-14 DIAGNOSIS — I110 Hypertensive heart disease with heart failure: Secondary | ICD-10-CM | POA: Diagnosis not present

## 2015-06-14 DIAGNOSIS — R338 Other retention of urine: Secondary | ICD-10-CM | POA: Diagnosis not present

## 2015-06-14 DIAGNOSIS — Z7901 Long term (current) use of anticoagulants: Secondary | ICD-10-CM | POA: Diagnosis not present

## 2015-06-14 DIAGNOSIS — I4891 Unspecified atrial fibrillation: Secondary | ICD-10-CM | POA: Diagnosis not present

## 2015-06-29 ENCOUNTER — Ambulatory Visit (INDEPENDENT_AMBULATORY_CARE_PROVIDER_SITE_OTHER): Payer: Medicare Other | Admitting: Cardiovascular Disease

## 2015-06-29 DIAGNOSIS — Z5181 Encounter for therapeutic drug level monitoring: Secondary | ICD-10-CM | POA: Diagnosis not present

## 2015-06-29 DIAGNOSIS — R338 Other retention of urine: Secondary | ICD-10-CM | POA: Diagnosis not present

## 2015-06-29 DIAGNOSIS — N319 Neuromuscular dysfunction of bladder, unspecified: Secondary | ICD-10-CM | POA: Diagnosis not present

## 2015-06-29 DIAGNOSIS — Z7901 Long term (current) use of anticoagulants: Secondary | ICD-10-CM | POA: Diagnosis not present

## 2015-06-29 DIAGNOSIS — Z466 Encounter for fitting and adjustment of urinary device: Secondary | ICD-10-CM | POA: Diagnosis not present

## 2015-06-29 DIAGNOSIS — I509 Heart failure, unspecified: Secondary | ICD-10-CM | POA: Diagnosis not present

## 2015-06-29 DIAGNOSIS — N3289 Other specified disorders of bladder: Secondary | ICD-10-CM | POA: Diagnosis not present

## 2015-06-29 DIAGNOSIS — I4891 Unspecified atrial fibrillation: Secondary | ICD-10-CM | POA: Diagnosis not present

## 2015-06-29 DIAGNOSIS — I110 Hypertensive heart disease with heart failure: Secondary | ICD-10-CM | POA: Diagnosis not present

## 2015-06-29 DIAGNOSIS — N401 Enlarged prostate with lower urinary tract symptoms: Secondary | ICD-10-CM | POA: Diagnosis not present

## 2015-06-29 LAB — POCT INR: INR: 3.3

## 2015-07-02 DIAGNOSIS — N319 Neuromuscular dysfunction of bladder, unspecified: Secondary | ICD-10-CM | POA: Diagnosis not present

## 2015-07-02 DIAGNOSIS — N401 Enlarged prostate with lower urinary tract symptoms: Secondary | ICD-10-CM | POA: Diagnosis not present

## 2015-07-02 DIAGNOSIS — Z5181 Encounter for therapeutic drug level monitoring: Secondary | ICD-10-CM | POA: Diagnosis not present

## 2015-07-02 DIAGNOSIS — Z466 Encounter for fitting and adjustment of urinary device: Secondary | ICD-10-CM | POA: Diagnosis not present

## 2015-07-02 DIAGNOSIS — N3289 Other specified disorders of bladder: Secondary | ICD-10-CM | POA: Diagnosis not present

## 2015-07-02 DIAGNOSIS — I110 Hypertensive heart disease with heart failure: Secondary | ICD-10-CM | POA: Diagnosis not present

## 2015-07-02 DIAGNOSIS — I509 Heart failure, unspecified: Secondary | ICD-10-CM | POA: Diagnosis not present

## 2015-07-02 DIAGNOSIS — I4891 Unspecified atrial fibrillation: Secondary | ICD-10-CM | POA: Diagnosis not present

## 2015-07-02 DIAGNOSIS — Z7901 Long term (current) use of anticoagulants: Secondary | ICD-10-CM | POA: Diagnosis not present

## 2015-07-02 DIAGNOSIS — R338 Other retention of urine: Secondary | ICD-10-CM | POA: Diagnosis not present

## 2015-07-03 DIAGNOSIS — Z7901 Long term (current) use of anticoagulants: Secondary | ICD-10-CM | POA: Diagnosis not present

## 2015-07-03 DIAGNOSIS — N319 Neuromuscular dysfunction of bladder, unspecified: Secondary | ICD-10-CM | POA: Diagnosis not present

## 2015-07-03 DIAGNOSIS — I4891 Unspecified atrial fibrillation: Secondary | ICD-10-CM | POA: Diagnosis not present

## 2015-07-03 DIAGNOSIS — R338 Other retention of urine: Secondary | ICD-10-CM | POA: Diagnosis not present

## 2015-07-03 DIAGNOSIS — I110 Hypertensive heart disease with heart failure: Secondary | ICD-10-CM | POA: Diagnosis not present

## 2015-07-03 DIAGNOSIS — N3289 Other specified disorders of bladder: Secondary | ICD-10-CM | POA: Diagnosis not present

## 2015-07-03 DIAGNOSIS — Z5181 Encounter for therapeutic drug level monitoring: Secondary | ICD-10-CM | POA: Diagnosis not present

## 2015-07-03 DIAGNOSIS — I509 Heart failure, unspecified: Secondary | ICD-10-CM | POA: Diagnosis not present

## 2015-07-03 DIAGNOSIS — N401 Enlarged prostate with lower urinary tract symptoms: Secondary | ICD-10-CM | POA: Diagnosis not present

## 2015-07-03 DIAGNOSIS — Z466 Encounter for fitting and adjustment of urinary device: Secondary | ICD-10-CM | POA: Diagnosis not present

## 2015-07-05 DIAGNOSIS — Z466 Encounter for fitting and adjustment of urinary device: Secondary | ICD-10-CM | POA: Diagnosis not present

## 2015-07-05 DIAGNOSIS — Z7901 Long term (current) use of anticoagulants: Secondary | ICD-10-CM | POA: Diagnosis not present

## 2015-07-05 DIAGNOSIS — N401 Enlarged prostate with lower urinary tract symptoms: Secondary | ICD-10-CM | POA: Diagnosis not present

## 2015-07-05 DIAGNOSIS — I4891 Unspecified atrial fibrillation: Secondary | ICD-10-CM | POA: Diagnosis not present

## 2015-07-05 DIAGNOSIS — I509 Heart failure, unspecified: Secondary | ICD-10-CM | POA: Diagnosis not present

## 2015-07-05 DIAGNOSIS — N3289 Other specified disorders of bladder: Secondary | ICD-10-CM | POA: Diagnosis not present

## 2015-07-05 DIAGNOSIS — I110 Hypertensive heart disease with heart failure: Secondary | ICD-10-CM | POA: Diagnosis not present

## 2015-07-05 DIAGNOSIS — N319 Neuromuscular dysfunction of bladder, unspecified: Secondary | ICD-10-CM | POA: Diagnosis not present

## 2015-07-05 DIAGNOSIS — R338 Other retention of urine: Secondary | ICD-10-CM | POA: Diagnosis not present

## 2015-07-05 DIAGNOSIS — Z5181 Encounter for therapeutic drug level monitoring: Secondary | ICD-10-CM | POA: Diagnosis not present

## 2015-07-13 ENCOUNTER — Ambulatory Visit (INDEPENDENT_AMBULATORY_CARE_PROVIDER_SITE_OTHER): Payer: Medicare Other | Admitting: Cardiology

## 2015-07-13 ENCOUNTER — Other Ambulatory Visit: Payer: Self-pay | Admitting: Internal Medicine

## 2015-07-13 ENCOUNTER — Other Ambulatory Visit: Payer: Self-pay | Admitting: Cardiology

## 2015-07-13 DIAGNOSIS — I110 Hypertensive heart disease with heart failure: Secondary | ICD-10-CM | POA: Diagnosis not present

## 2015-07-13 DIAGNOSIS — I509 Heart failure, unspecified: Secondary | ICD-10-CM | POA: Diagnosis not present

## 2015-07-13 DIAGNOSIS — Z466 Encounter for fitting and adjustment of urinary device: Secondary | ICD-10-CM | POA: Diagnosis not present

## 2015-07-13 DIAGNOSIS — Z7901 Long term (current) use of anticoagulants: Secondary | ICD-10-CM | POA: Diagnosis not present

## 2015-07-13 DIAGNOSIS — I4891 Unspecified atrial fibrillation: Secondary | ICD-10-CM

## 2015-07-13 DIAGNOSIS — N3289 Other specified disorders of bladder: Secondary | ICD-10-CM | POA: Diagnosis not present

## 2015-07-13 DIAGNOSIS — Z5181 Encounter for therapeutic drug level monitoring: Secondary | ICD-10-CM | POA: Diagnosis not present

## 2015-07-13 DIAGNOSIS — R338 Other retention of urine: Secondary | ICD-10-CM | POA: Diagnosis not present

## 2015-07-13 DIAGNOSIS — N401 Enlarged prostate with lower urinary tract symptoms: Secondary | ICD-10-CM | POA: Diagnosis not present

## 2015-07-13 DIAGNOSIS — N319 Neuromuscular dysfunction of bladder, unspecified: Secondary | ICD-10-CM | POA: Diagnosis not present

## 2015-07-13 LAB — POCT INR: INR: 5.9

## 2015-07-20 ENCOUNTER — Ambulatory Visit (INDEPENDENT_AMBULATORY_CARE_PROVIDER_SITE_OTHER): Payer: Medicare Other | Admitting: Pharmacist Clinician (PhC)/ Clinical Pharmacy Specialist

## 2015-07-20 DIAGNOSIS — N319 Neuromuscular dysfunction of bladder, unspecified: Secondary | ICD-10-CM | POA: Diagnosis not present

## 2015-07-20 DIAGNOSIS — I110 Hypertensive heart disease with heart failure: Secondary | ICD-10-CM | POA: Diagnosis not present

## 2015-07-20 DIAGNOSIS — Z7901 Long term (current) use of anticoagulants: Secondary | ICD-10-CM | POA: Diagnosis not present

## 2015-07-20 DIAGNOSIS — R338 Other retention of urine: Secondary | ICD-10-CM | POA: Diagnosis not present

## 2015-07-20 DIAGNOSIS — N401 Enlarged prostate with lower urinary tract symptoms: Secondary | ICD-10-CM | POA: Diagnosis not present

## 2015-07-20 DIAGNOSIS — Z466 Encounter for fitting and adjustment of urinary device: Secondary | ICD-10-CM | POA: Diagnosis not present

## 2015-07-20 DIAGNOSIS — I509 Heart failure, unspecified: Secondary | ICD-10-CM | POA: Diagnosis not present

## 2015-07-20 DIAGNOSIS — I4891 Unspecified atrial fibrillation: Secondary | ICD-10-CM

## 2015-07-20 DIAGNOSIS — N3289 Other specified disorders of bladder: Secondary | ICD-10-CM | POA: Diagnosis not present

## 2015-07-20 DIAGNOSIS — Z5181 Encounter for therapeutic drug level monitoring: Secondary | ICD-10-CM | POA: Diagnosis not present

## 2015-07-20 LAB — POCT INR: INR: 2.1

## 2015-07-24 DIAGNOSIS — N3289 Other specified disorders of bladder: Secondary | ICD-10-CM | POA: Diagnosis not present

## 2015-07-24 DIAGNOSIS — N401 Enlarged prostate with lower urinary tract symptoms: Secondary | ICD-10-CM | POA: Diagnosis not present

## 2015-07-24 DIAGNOSIS — Z466 Encounter for fitting and adjustment of urinary device: Secondary | ICD-10-CM | POA: Diagnosis not present

## 2015-07-24 DIAGNOSIS — I4891 Unspecified atrial fibrillation: Secondary | ICD-10-CM | POA: Diagnosis not present

## 2015-07-24 DIAGNOSIS — N319 Neuromuscular dysfunction of bladder, unspecified: Secondary | ICD-10-CM | POA: Diagnosis not present

## 2015-07-24 DIAGNOSIS — Z7901 Long term (current) use of anticoagulants: Secondary | ICD-10-CM | POA: Diagnosis not present

## 2015-07-24 DIAGNOSIS — Z5181 Encounter for therapeutic drug level monitoring: Secondary | ICD-10-CM | POA: Diagnosis not present

## 2015-07-24 DIAGNOSIS — R338 Other retention of urine: Secondary | ICD-10-CM | POA: Diagnosis not present

## 2015-07-24 DIAGNOSIS — I509 Heart failure, unspecified: Secondary | ICD-10-CM | POA: Diagnosis not present

## 2015-07-24 DIAGNOSIS — I110 Hypertensive heart disease with heart failure: Secondary | ICD-10-CM | POA: Diagnosis not present

## 2015-07-27 ENCOUNTER — Ambulatory Visit (INDEPENDENT_AMBULATORY_CARE_PROVIDER_SITE_OTHER): Payer: Medicare Other | Admitting: Interventional Cardiology

## 2015-07-27 DIAGNOSIS — Z7901 Long term (current) use of anticoagulants: Secondary | ICD-10-CM

## 2015-07-27 DIAGNOSIS — I4891 Unspecified atrial fibrillation: Secondary | ICD-10-CM

## 2015-07-27 DIAGNOSIS — Z5181 Encounter for therapeutic drug level monitoring: Secondary | ICD-10-CM | POA: Diagnosis not present

## 2015-07-27 DIAGNOSIS — I509 Heart failure, unspecified: Secondary | ICD-10-CM | POA: Diagnosis not present

## 2015-07-27 DIAGNOSIS — I110 Hypertensive heart disease with heart failure: Secondary | ICD-10-CM | POA: Diagnosis not present

## 2015-07-27 DIAGNOSIS — N319 Neuromuscular dysfunction of bladder, unspecified: Secondary | ICD-10-CM | POA: Diagnosis not present

## 2015-07-27 DIAGNOSIS — N3289 Other specified disorders of bladder: Secondary | ICD-10-CM | POA: Diagnosis not present

## 2015-07-27 DIAGNOSIS — R338 Other retention of urine: Secondary | ICD-10-CM | POA: Diagnosis not present

## 2015-07-27 DIAGNOSIS — N401 Enlarged prostate with lower urinary tract symptoms: Secondary | ICD-10-CM | POA: Diagnosis not present

## 2015-07-27 DIAGNOSIS — Z466 Encounter for fitting and adjustment of urinary device: Secondary | ICD-10-CM | POA: Diagnosis not present

## 2015-07-27 LAB — POCT INR: INR: 3.4

## 2015-07-29 ENCOUNTER — Other Ambulatory Visit: Payer: Self-pay | Admitting: Cardiology

## 2015-07-29 NOTE — Telephone Encounter (Signed)
Please advise on refill as patient is overdue for an appointment, but phone note from 04/19/15 indicates that patient is homebound. Thanks, MI

## 2015-07-29 NOTE — Telephone Encounter (Signed)
Dr Aundra Dubin -you have not seen since August 2015-he is homebound and cannot get to office.  Do you want to refill lisinopril?

## 2015-08-10 ENCOUNTER — Ambulatory Visit (INDEPENDENT_AMBULATORY_CARE_PROVIDER_SITE_OTHER): Payer: Medicare Other | Admitting: Cardiology

## 2015-08-10 DIAGNOSIS — I4891 Unspecified atrial fibrillation: Secondary | ICD-10-CM

## 2015-08-10 DIAGNOSIS — Z7901 Long term (current) use of anticoagulants: Secondary | ICD-10-CM

## 2015-08-10 DIAGNOSIS — N319 Neuromuscular dysfunction of bladder, unspecified: Secondary | ICD-10-CM | POA: Diagnosis not present

## 2015-08-10 DIAGNOSIS — R238 Other skin changes: Secondary | ICD-10-CM | POA: Diagnosis not present

## 2015-08-10 DIAGNOSIS — N401 Enlarged prostate with lower urinary tract symptoms: Secondary | ICD-10-CM | POA: Diagnosis not present

## 2015-08-10 DIAGNOSIS — I110 Hypertensive heart disease with heart failure: Secondary | ICD-10-CM | POA: Diagnosis not present

## 2015-08-10 DIAGNOSIS — R338 Other retention of urine: Secondary | ICD-10-CM | POA: Diagnosis not present

## 2015-08-10 DIAGNOSIS — I509 Heart failure, unspecified: Secondary | ICD-10-CM | POA: Diagnosis not present

## 2015-08-10 DIAGNOSIS — Z466 Encounter for fitting and adjustment of urinary device: Secondary | ICD-10-CM | POA: Diagnosis not present

## 2015-08-10 DIAGNOSIS — Z5181 Encounter for therapeutic drug level monitoring: Secondary | ICD-10-CM | POA: Diagnosis not present

## 2015-08-10 LAB — POCT INR: INR: 3.4

## 2015-08-19 DIAGNOSIS — F339 Major depressive disorder, recurrent, unspecified: Secondary | ICD-10-CM | POA: Diagnosis not present

## 2015-08-19 DIAGNOSIS — R339 Retention of urine, unspecified: Secondary | ICD-10-CM | POA: Diagnosis not present

## 2015-08-19 DIAGNOSIS — I10 Essential (primary) hypertension: Secondary | ICD-10-CM | POA: Diagnosis not present

## 2015-08-19 DIAGNOSIS — I4891 Unspecified atrial fibrillation: Secondary | ICD-10-CM | POA: Diagnosis not present

## 2015-08-19 DIAGNOSIS — E784 Other hyperlipidemia: Secondary | ICD-10-CM | POA: Diagnosis not present

## 2015-08-20 ENCOUNTER — Ambulatory Visit (INDEPENDENT_AMBULATORY_CARE_PROVIDER_SITE_OTHER): Payer: Medicare Other | Admitting: Cardiovascular Disease

## 2015-08-20 DIAGNOSIS — R238 Other skin changes: Secondary | ICD-10-CM | POA: Diagnosis not present

## 2015-08-20 DIAGNOSIS — R338 Other retention of urine: Secondary | ICD-10-CM | POA: Diagnosis not present

## 2015-08-20 DIAGNOSIS — Z7901 Long term (current) use of anticoagulants: Secondary | ICD-10-CM

## 2015-08-20 DIAGNOSIS — Z466 Encounter for fitting and adjustment of urinary device: Secondary | ICD-10-CM | POA: Diagnosis not present

## 2015-08-20 DIAGNOSIS — I4891 Unspecified atrial fibrillation: Secondary | ICD-10-CM | POA: Diagnosis not present

## 2015-08-20 DIAGNOSIS — I509 Heart failure, unspecified: Secondary | ICD-10-CM | POA: Diagnosis not present

## 2015-08-20 DIAGNOSIS — Z5181 Encounter for therapeutic drug level monitoring: Secondary | ICD-10-CM | POA: Diagnosis not present

## 2015-08-20 DIAGNOSIS — I110 Hypertensive heart disease with heart failure: Secondary | ICD-10-CM | POA: Diagnosis not present

## 2015-08-20 DIAGNOSIS — N319 Neuromuscular dysfunction of bladder, unspecified: Secondary | ICD-10-CM | POA: Diagnosis not present

## 2015-08-20 DIAGNOSIS — N401 Enlarged prostate with lower urinary tract symptoms: Secondary | ICD-10-CM | POA: Diagnosis not present

## 2015-08-20 LAB — POCT INR: INR: 2.8

## 2015-09-03 LAB — POCT INR: INR: 2.3

## 2015-09-06 ENCOUNTER — Ambulatory Visit (INDEPENDENT_AMBULATORY_CARE_PROVIDER_SITE_OTHER): Payer: Medicare Other | Admitting: Internal Medicine

## 2015-09-06 DIAGNOSIS — Z7901 Long term (current) use of anticoagulants: Secondary | ICD-10-CM

## 2015-09-06 DIAGNOSIS — I4891 Unspecified atrial fibrillation: Secondary | ICD-10-CM

## 2015-09-24 ENCOUNTER — Ambulatory Visit (INDEPENDENT_AMBULATORY_CARE_PROVIDER_SITE_OTHER): Payer: Medicare Other | Admitting: Pharmacist

## 2015-09-24 DIAGNOSIS — Z7901 Long term (current) use of anticoagulants: Secondary | ICD-10-CM

## 2015-09-24 DIAGNOSIS — I4891 Unspecified atrial fibrillation: Secondary | ICD-10-CM

## 2015-09-24 LAB — POCT INR: INR: 2.7

## 2015-10-19 ENCOUNTER — Other Ambulatory Visit: Payer: Self-pay | Admitting: Podiatrist

## 2015-10-20 NOTE — Telephone Encounter (Signed)
Pt needs to make an appt to be established with a new doctor.

## 2015-10-22 ENCOUNTER — Encounter (HOSPITAL_COMMUNITY): Payer: Self-pay

## 2015-10-22 ENCOUNTER — Inpatient Hospital Stay (HOSPITAL_COMMUNITY)
Admission: EM | Admit: 2015-10-22 | Discharge: 2015-10-28 | DRG: 381 | Disposition: A | Discharge status: Home / Self Care | Payer: Medicare Other | Attending: Internal Medicine | Admitting: Internal Medicine

## 2015-10-22 ENCOUNTER — Inpatient Hospital Stay (HOSPITAL_COMMUNITY): Payer: Medicare Other

## 2015-10-22 DIAGNOSIS — I130 Hypertensive heart and chronic kidney disease with heart failure and stage 1 through stage 4 chronic kidney disease, or unspecified chronic kidney disease: Secondary | ICD-10-CM | POA: Diagnosis present

## 2015-10-22 DIAGNOSIS — K2211 Ulcer of esophagus with bleeding: Principal | ICD-10-CM | POA: Diagnosis present

## 2015-10-22 DIAGNOSIS — D72829 Elevated white blood cell count, unspecified: Secondary | ICD-10-CM

## 2015-10-22 DIAGNOSIS — K922 Gastrointestinal hemorrhage, unspecified: Secondary | ICD-10-CM | POA: Diagnosis not present

## 2015-10-22 DIAGNOSIS — I5022 Chronic systolic (congestive) heart failure: Secondary | ICD-10-CM | POA: Diagnosis present

## 2015-10-22 DIAGNOSIS — D649 Anemia, unspecified: Secondary | ICD-10-CM | POA: Diagnosis present

## 2015-10-22 DIAGNOSIS — N183 Chronic kidney disease, stage 3 (moderate): Secondary | ICD-10-CM | POA: Diagnosis present

## 2015-10-22 DIAGNOSIS — I959 Hypotension, unspecified: Secondary | ICD-10-CM | POA: Diagnosis present

## 2015-10-22 DIAGNOSIS — Z85828 Personal history of other malignant neoplasm of skin: Secondary | ICD-10-CM

## 2015-10-22 DIAGNOSIS — R109 Unspecified abdominal pain: Secondary | ICD-10-CM | POA: Diagnosis present

## 2015-10-22 DIAGNOSIS — E861 Hypovolemia: Secondary | ICD-10-CM | POA: Diagnosis present

## 2015-10-22 DIAGNOSIS — K449 Diaphragmatic hernia without obstruction or gangrene: Secondary | ICD-10-CM | POA: Diagnosis present

## 2015-10-22 DIAGNOSIS — I2510 Atherosclerotic heart disease of native coronary artery without angina pectoris: Secondary | ICD-10-CM | POA: Diagnosis present

## 2015-10-22 DIAGNOSIS — I4891 Unspecified atrial fibrillation: Secondary | ICD-10-CM | POA: Diagnosis present

## 2015-10-22 DIAGNOSIS — Z7901 Long term (current) use of anticoagulants: Secondary | ICD-10-CM | POA: Diagnosis not present

## 2015-10-22 DIAGNOSIS — I4581 Long QT syndrome: Secondary | ICD-10-CM | POA: Diagnosis present

## 2015-10-22 DIAGNOSIS — I10 Essential (primary) hypertension: Secondary | ICD-10-CM | POA: Diagnosis not present

## 2015-10-22 DIAGNOSIS — F329 Major depressive disorder, single episode, unspecified: Secondary | ICD-10-CM | POA: Diagnosis present

## 2015-10-22 DIAGNOSIS — E785 Hyperlipidemia, unspecified: Secondary | ICD-10-CM | POA: Diagnosis present

## 2015-10-22 DIAGNOSIS — L89151 Pressure ulcer of sacral region, stage 1: Secondary | ICD-10-CM | POA: Diagnosis not present

## 2015-10-22 DIAGNOSIS — I509 Heart failure, unspecified: Secondary | ICD-10-CM

## 2015-10-22 DIAGNOSIS — N179 Acute kidney failure, unspecified: Secondary | ICD-10-CM | POA: Diagnosis present

## 2015-10-22 DIAGNOSIS — R140 Abdominal distension (gaseous): Secondary | ICD-10-CM

## 2015-10-22 DIAGNOSIS — N400 Enlarged prostate without lower urinary tract symptoms: Secondary | ICD-10-CM | POA: Diagnosis present

## 2015-10-22 DIAGNOSIS — R112 Nausea with vomiting, unspecified: Secondary | ICD-10-CM | POA: Diagnosis present

## 2015-10-22 DIAGNOSIS — Z86010 Personal history of colonic polyps: Secondary | ICD-10-CM

## 2015-10-22 DIAGNOSIS — L8990 Pressure ulcer of unspecified site, unspecified stage: Secondary | ICD-10-CM | POA: Insufficient documentation

## 2015-10-22 DIAGNOSIS — Z87891 Personal history of nicotine dependence: Secondary | ICD-10-CM

## 2015-10-22 DIAGNOSIS — T45511A Poisoning by anticoagulants, accidental (unintentional), initial encounter: Secondary | ICD-10-CM

## 2015-10-22 LAB — COMPREHENSIVE METABOLIC PANEL
ALBUMIN: 2.5 g/dL — AB (ref 3.5–5.0)
ALK PHOS: 117 U/L (ref 38–126)
ALT: 29 U/L (ref 17–63)
ANION GAP: 13 (ref 5–15)
AST: 39 U/L (ref 15–41)
BUN: 45 mg/dL — ABNORMAL HIGH (ref 6–20)
CALCIUM: 8.7 mg/dL — AB (ref 8.9–10.3)
CHLORIDE: 102 mmol/L (ref 101–111)
CO2: 20 mmol/L — AB (ref 22–32)
Creatinine, Ser: 3.41 mg/dL — ABNORMAL HIGH (ref 0.61–1.24)
GFR calc Af Amer: 17 mL/min — ABNORMAL LOW (ref >60)
GFR calc non Af Amer: 15 mL/min — ABNORMAL LOW (ref >60)
GLUCOSE: 118 mg/dL — AB (ref 65–99)
POTASSIUM: 3.9 mmol/L (ref 3.5–5.1)
SODIUM: 135 mmol/L (ref 135–145)
Total Bilirubin: 0.6 mg/dL (ref 0.3–1.2)
Total Protein: 6.9 g/dL (ref 6.5–8.1)

## 2015-10-22 LAB — URINALYSIS, ROUTINE W REFLEX MICROSCOPIC
Glucose, UA: NEGATIVE mg/dL
Ketones, ur: NEGATIVE mg/dL
NITRITE: NEGATIVE
Protein, ur: 30 mg/dL — AB
SPECIFIC GRAVITY, URINE: 1.018 (ref 1.005–1.030)
pH: 7 (ref 5.0–8.0)

## 2015-10-22 LAB — CBC
HCT: 29.8 % — ABNORMAL LOW (ref 39.0–52.0)
HEMATOCRIT: 30.7 % — AB (ref 39.0–52.0)
HEMOGLOBIN: 9.5 g/dL — AB (ref 13.0–17.0)
HEMOGLOBIN: 9.2 g/dL — AB (ref 13.0–17.0)
MCH: 27.9 pg (ref 26.0–34.0)
MCH: 27.7 pg (ref 26.0–34.0)
MCHC: 30.9 g/dL (ref 30.0–36.0)
MCHC: 30.9 g/dL (ref 30.0–36.0)
MCV: 90.3 fL (ref 78.0–100.0)
MCV: 89.8 fL (ref 78.0–100.0)
Platelets: 273 10*3/uL (ref 150–400)
Platelets: 239 10*3/uL (ref 150–400)
RBC: 3.4 MIL/uL — ABNORMAL LOW (ref 4.22–5.81)
RBC: 3.32 MIL/uL — AB (ref 4.22–5.81)
RDW: 16.2 % — ABNORMAL HIGH (ref 11.5–15.5)
RDW: 16.2 % — ABNORMAL HIGH (ref 11.5–15.5)
WBC: 34.1 10*3/uL — ABNORMAL HIGH (ref 4.0–10.5)
WBC: 24 10*3/uL — AB (ref 4.0–10.5)

## 2015-10-22 LAB — URINE MICROSCOPIC-ADD ON

## 2015-10-22 LAB — TYPE AND SCREEN
ABO/RH(D): O POS
Antibody Screen: NEGATIVE

## 2015-10-22 LAB — PROTIME-INR
INR: 4.63 — AB (ref 0.00–1.49)
PROTHROMBIN TIME: 42.4 s — AB (ref 11.6–15.2)

## 2015-10-22 LAB — POC OCCULT BLOOD, ED: FECAL OCCULT BLD: NEGATIVE

## 2015-10-22 LAB — ABO/RH: ABO/RH(D): O POS

## 2015-10-22 MED ORDER — VITAMIN K1 10 MG/ML IJ SOLN
5.0000 mg | Freq: Once | INTRAVENOUS | Status: AC
  Administered 2015-10-22: 5 mg via INTRAVENOUS
  Filled 2015-10-22: qty 0.5

## 2015-10-22 MED ORDER — FUROSEMIDE 10 MG/ML IJ SOLN
20.0000 mg | Freq: Once | INTRAMUSCULAR | Status: AC
  Administered 2015-10-22: 20 mg via INTRAVENOUS
  Filled 2015-10-22: qty 2

## 2015-10-22 MED ORDER — PANTOPRAZOLE SODIUM 40 MG IV SOLR
8.0000 mg/h | INTRAVENOUS | Status: DC
  Administered 2015-10-22 – 2015-10-25 (×6): 8 mg/h via INTRAVENOUS
  Filled 2015-10-22 (×13): qty 80

## 2015-10-22 MED ORDER — SODIUM CHLORIDE 0.9 % IV SOLN
10.0000 mL/h | Freq: Once | INTRAVENOUS | Status: AC
  Administered 2015-10-22: 10 mL/h via INTRAVENOUS

## 2015-10-22 MED ORDER — PANTOPRAZOLE SODIUM 40 MG IV SOLR
40.0000 mg | Freq: Two times a day (BID) | INTRAVENOUS | Status: DC
  Administered 2015-10-26 – 2015-10-28 (×5): 40 mg via INTRAVENOUS
  Filled 2015-10-22 (×5): qty 40

## 2015-10-22 MED ORDER — SODIUM CHLORIDE 0.9 % IV BOLUS (SEPSIS)
1000.0000 mL | Freq: Once | INTRAVENOUS | Status: AC
  Administered 2015-10-22: 1000 mL via INTRAVENOUS

## 2015-10-22 MED ORDER — SODIUM CHLORIDE 0.9 % IV SOLN
80.0000 mg | Freq: Once | INTRAVENOUS | Status: AC
  Administered 2015-10-22: 80 mg via INTRAVENOUS
  Filled 2015-10-22: qty 80

## 2015-10-22 MED ORDER — PANTOPRAZOLE SODIUM 40 MG IV SOLR
40.0000 mg | Freq: Once | INTRAVENOUS | Status: AC
  Administered 2015-10-22: 40 mg via INTRAVENOUS
  Filled 2015-10-22: qty 40

## 2015-10-22 MED ORDER — SODIUM CHLORIDE 0.9 % IV SOLN
Freq: Once | INTRAVENOUS | Status: AC
  Administered 2015-10-22: 19:00:00 via INTRAVENOUS

## 2015-10-22 NOTE — H&P (Signed)
[facility] History and Physical  [name] [id number] DOB: [birth year] DOA: 10/22/2015  Referring physician: Dr Eulis Foster PCP: No primary care provider on file.   Chief Complaint: Coffee ground emesis.   HPI: [name] is a 82 y.o. male with PMH significant for Systolic HF Ef 30 to 35 % by ECHO 2014, BPH, chronic foley catheter, A fib on Coumadin, CKD stage III cr range 1.2 to 1.7 who presents with coffee ground emesis. Patient was found by daughter whit black fluid, covering chest, blanket, and floor. He has not been eating well lately. He was complaining of left side pain. He has chronic abdominal distension. No melena. No bright blood per rectum.   Evaluation in the ED: Hb at 9.5, Cr 3.4, INR at 4.6, WBC at 34. Occult blood negative    Review of Systems:  Negative except as per HPI.    Past Medical History  Diagnosis Date  . Anemia     nos  . History of colonic polyps   . Hyperlipidemia   . Hypertension   . Cancer ([place]) [id number]    hx of basal cell  . BPH (benign prostatic hyperplasia)   . Anxiety attack     panic   . Pre-syncope     echo 03/2010,with EF 60%,mild RV dilation,no significant abnormalities...event monitor for 3wks/episodes os sinus bradycardia with heart rate to low 40's occasionally at night(suspect while sleeping)  . Atrial fibrillation ([place])     a. Dx 11/2012 - TEE with possible LAA thrombus, started on amio/coumadin.  . Systolic CHF ([place])     a. EF 25-30% 11/2012 ?tachy-mediated. b. Not on ACEI at dc due to AKI, can consider as OP.  [name] CAD (coronary artery disease)     a. Mod CAD by cath 12/10/12 - 60% long proximal LAD, 60% prox PDA.  [name] AKI (acute kidney injury) ([place])     a. Peak Cr 4.17 11/2012 - felt to be post-obstructive.  . Urinary retention     a. Post obstructive, dc'd with foley 12/2012.  [name] Renal cyst     a. By renal US 11/2012, not candidate for further eval.  . Thrombus of left atrial appendage     a. Possible LAA thrombus by TEE  11/2012.   Past Surgical History  Procedure Laterality Date  . Colonoscopy w/ polypectomy  2007  . Cholecystectomy    . Total knee arthroplasty    . Mohs surgery  2010  . Tee without cardioversion N/A 12/11/2012    Procedure: TRANSESOPHAGEAL ECHOCARDIOGRAM (TEE);  Surgeon: Thayer Headings, MD;  Location: [place];  Service: Cardiovascular;  Laterality: N/A;  ja/bev.  Darden Dates without cardioversion N/A 01/21/2013    Procedure: TRANSESOPHAGEAL ECHOCARDIOGRAM (TEE);  Surgeon: Larey Dresser, MD;  Location: [place];  Service: Cardiovascular;  Laterality: N/A;  . Cardioversion N/A 01/21/2013    Procedure: CARDIOVERSION;  Surgeon: Larey Dresser, MD;  Location: [place];  Service: Cardiovascular;  Laterality: N/A;  . Left heart catheterization with coronary angiogram N/A 12/10/2012    Procedure: LEFT HEART CATHETERIZATION WITH CORONARY ANGIOGRAM;  Surgeon: Larey Dresser, MD;  Location: [facility] CATH LAB;  Service: Cardiovascular;  Laterality: N/A;   Social History:  reports that he quit smoking about 32 years ago. He does not have any smokeless tobacco history on file. He reports that he does not drink alcohol or use illicit drugs.  Allergies  Allergen Reactions  . Darifenacin Hydrobromide     REACTION: constipation  .  Fluoxetine Hcl     REACTION: Very Depressed while taking    Family History  Problem Relation Age of Onset  . Heart attack Father   . Heart failure Father 36    S/P Turp  . Heart disease Father 64    MI.Marland KitchenMarland KitchenCHF S/P TURP @ 45  . Stroke Brother 61  . Coronary artery disease Brother   . Lung cancer Brother     Prior to Admission medications   Medication Sig Start Date End Date Taking? Authorizing Provider  acetaminophen (TYLENOL) 325 MG tablet Take 650 mg by mouth every 6 (six) hours as needed for moderate pain (pain).     Historical Provider, MD  amiodarone (PACERONE) 200 MG tablet Take 0.5 tablets (100 mg total) by mouth daily. 04/19/15   Larey Dresser, MD  buPROPion  (WELLBUTRIN) 75 MG tablet TAKE 1 TABLET BY MOUTH 2 TIMES DAILY. 03/23/15   Hendricks Limes, MD  buPROPion (WELLBUTRIN) 75 MG tablet Take 1 tablet (75 mg total) by mouth 2 (two) times daily. 06/10/15   Hendricks Limes, MD  cephALEXin (KEFLEX) 500 MG capsule Take 1 capsule (500 mg total) by mouth 2 (two) times daily. Patient not taking: Reported on 11/13/2014 05/20/14   Davonna Belling, MD  clonazePAM Bobbye [place]) 0.5 MG tablet Take 1/2 tablet BID prn High risk of falls with this medication 05/28/15   Hendricks Limes, MD  finasteride (PROSCAR) 5 MG tablet Take 1 tablet (5 mg total) by mouth daily. 12/20/12   Dayna N Dunn, PA-C  furosemide (LASIX) 40 MG tablet TAKE 1 TABLET (40 MG TOTAL) BY MOUTH EVERY OTHER DAY. 09/25/14   Larey Dresser, MD  furosemide (LASIX) 40 MG tablet Take 1 tablet (40 mg total) by mouth every other day. 04/19/15   Larey Dresser, MD  l-methylfolate-B6-B12 (METANX) 3-35-2 MG TABS tablet TAKE 1 TABLET BY MOUTH 2 (TWO) TIMES DAILY. 10/20/15   Wallene Huh, DPM  lisinopril (PRINIVIL,ZESTRIL) 5 MG tablet TAKE 1 TABLET (5 MG TOTAL) BY MOUTH DAILY. 07/29/15   Larey Dresser, MD  Meth-Hyo-M Barnett Hatter Phos-Ph Sal (URIBEL PO) Take 1 capsule by mouth daily as needed (bladder spasm).     Historical Provider, MD  Multiple Vitamin (MULTIVITAMIN) tablet Take 1 tablet by mouth daily.    Historical Provider, MD  phenazopyridine (PYRIDIUM) 100 MG tablet Take 100 mg by mouth 3 (three) times daily as needed for pain (bladder spasms).     Historical Provider, MD  pravastatin (PRAVACHOL) 80 MG tablet TAKE 1 TABLET (80 MG TOTAL) BY MOUTH DAILY. 07/13/15   Larey Dresser, MD  Probiotic Product (ALIGN) 4 MG CAPS Take 1 capsule by mouth daily.     Historical Provider, MD  sulfamethoxazole-trimethoprim (BACTRIM DS,SEPTRA DS) 800-160 MG per tablet Take 1 tablet by mouth 2 (two) times daily. 11/13/14   Davonna Belling, MD  warfarin (COUMADIN) 2.5 MG tablet TAKE AS DIRECTED BY ANTICOAGULATION CLINIC 02/17/15   Larey Dresser, MD   Physical Exam: Filed Vitals:   10/22/15 1515 10/22/15 1530 10/22/15 1545 10/22/15 1615  BP: 91/42 91/56 93/41  93/43  Pulse: 62 61 59 61  Temp:      TempSrc:      Resp: 20 20 19 19   Height:      Weight:      SpO2: 97% 97% 98% 97%    Wt Readings from Last 3 Encounters:  10/22/15 81.647 kg (180 lb)  11/13/14 83.915 kg (185 lb)  05/22/13 83.462 kg (  184 lb)    General:  Appears calm and comfortable, hard of hearing.  Eyes: PERRL, normal lids, irises & conjunctiva ENT: grossly normal hearing, lips & tongue Neck: no LAD, masses or thyromegaly Cardiovascular: RRR, no m/r/g. Trace LE. Brace left LE  Telemetry: SR, no arrhythmias  Respiratory: CTA bilaterally, no w/r/r. Normal respiratory effort. Abdomen: soft, ntnd, distended/  Skin: no rash or induration seen on limited exam Musculoskeletal: grossly normal tone BUE/BLE Psychiatric: grossly normal mood and affect, speech fluent and appropriate Neurologic: grossly non-focal.          Labs on Admission:  Basic Metabolic Panel:  Recent Labs Lab 10/22/15 1424  NA 135  K 3.9  CL 102  CO2 20*  GLUCOSE 118*  BUN 45*  CREATININE 3.41*  CALCIUM 8.7*   Liver Function Tests:  Recent Labs Lab 10/22/15 1424  AST 39  ALT 29  ALKPHOS 117  BILITOT 0.6  PROT 6.9  ALBUMIN 2.5*   No results for input(s): LIPASE, AMYLASE in the last 168 hours. No results for input(s): AMMONIA in the last 168 hours. CBC:  Recent Labs Lab 10/22/15 1424  WBC 34.1*  HGB 9.5*  HCT 30.7*  MCV 90.3  PLT 273   Cardiac Enzymes: No results for input(s): CKTOTAL, CKMB, CKMBINDEX, TROPONINI in the last 168 hours.  BNP (last 3 results) No results for input(s): BNP in the last 8760 hours.  ProBNP (last 3 results) No results for input(s): PROBNP in the last 8760 hours.  CBG: No results for input(s): GLUCAP in the last 168 hours.  Radiological Exams on Admission: No results found.  EKG: Independently reviewed. Afib.    Assessment/Plan Active Problems:   Essential hypertension   AKI (acute kidney injury) ([place])   Chronic systolic CHF (congestive heart failure) ([place])   Long term (current) use of anticoagulants   GI bleed   1-GI bleed, Coffee ground emesis;  Patient with supra-therapeutic INR.  Hold coumadin.  Getting one unit of FFP.  Received Vitamin K.  Will order another unit of FFP Protonix Gtt.  Cycle Hb , transfusion if hb drop further.   2-Acute on chronic renal failure, stage III. Prior cr range 1.2 to 1.7.  Suspect related to hypovolemia, hypotension, GI bleed.  IV fluids. Clear diet  Hold diuretics.  Check UA.  Repeat labs in am.  Strict I and O.  Will need to exchange foley catheter when INR is lower.   3-Leukocytosis.  No fever, repeat labs,. Check Chest x ray and UA.   4-Left side abdominal pain: check KUB.   5-Systolic HF; appears compensated.  Hold diuretics.   6-A fib: hold coumadin in setting of bleeding. Continue with amiodarone.      Code Status: Full Code.  DVT Prophylaxis:SCD, no anticoagulation due to bleeding  Family Communication: care discussed with daughter who was at bedside.  Disposition Plan: expect 3 to 4 days inpatient,   Time spent: 75 minutes.   Niel Hummer A [facility] Pager [phone]

## 2015-10-22 NOTE — ED Notes (Signed)
Pt arrived via EMS from home with a c/o 1 episode of dark tarry stool this AM. Pt is alert, oriented on arrival. Pt is a poor historian.

## 2015-10-22 NOTE — ED Provider Notes (Signed)
CSN: [id number]     Arrival date & time 10/22/15  1416 History   First MD Initiated Contact with Patient 10/22/15 1449     Chief Complaint  Patient presents with  . GI Bleeding     (Consider location/radiation/quality/duration/timing/severity/associated sxs/prior Treatment) HPI  [name] is a 82 year old gentleman with a PMH of atrial fibrillation (on warfarin, amiodarone), sCHF (EF 25-30% during Afib), basal cell carcinoma, and depression who presents with one episode of coffee-ground emesis this morning. The patient stays at his daughter's house, and has assistance with home health aids. The daughter saw him this morning and noticed there was "textural" black fluid all over his chest. He was also complaining of some abdominal pain. His daughter indicates that he has been eating less, although this is a longstanding problem. No new medications. No changes in eating leafy green vegetables. He denies any NSAID use. The patient has never had hematemesis or coffee ground emesis before. The patient did not lose consciousness. He denies any light-headedness, chest pain, or dyspnea.  Past Medical History  Diagnosis Date  . Anemia     nos  . History of colonic polyps   . Hyperlipidemia   . Hypertension   . Cancer ([place]) [id number]    hx of basal cell  . BPH (benign prostatic hyperplasia)   . Anxiety attack     panic   . Pre-syncope     echo 03/2010,with EF 60%,mild RV dilation,no significant abnormalities...event monitor for 3wks/episodes os sinus bradycardia with heart rate to low 40's occasionally at night(suspect while sleeping)  . Atrial fibrillation ([place])     a. Dx 11/2012 - TEE with possible LAA thrombus, started on amio/coumadin.  . Systolic CHF ([place])     a. EF 25-30% 11/2012 ?tachy-mediated. b. Not on ACEI at dc due to AKI, can consider as OP.  [name] CAD (coronary artery disease)     a. Mod CAD by cath 12/10/12 - 60% long proximal LAD, 60% prox PDA.  [name] AKI (acute kidney injury) ([place])     a.  Peak Cr 4.17 11/2012 - felt to be post-obstructive.  . Urinary retention     a. Post obstructive, dc'd with foley 12/2012.  [name] Renal cyst     a. By renal US 11/2012, not candidate for further eval.  . Thrombus of left atrial appendage     a. Possible LAA thrombus by TEE 11/2012.   Past Surgical History  Procedure Laterality Date  . Colonoscopy w/ polypectomy  2007  . Cholecystectomy    . Total knee arthroplasty    . Mohs surgery  2010  . Tee without cardioversion N/A 12/11/2012    Procedure: TRANSESOPHAGEAL ECHOCARDIOGRAM (TEE);  Surgeon: Thayer Headings, MD;  Location: [place];  Service: Cardiovascular;  Laterality: N/A;  ja/bev.  Darden Dates without cardioversion N/A 01/21/2013    Procedure: TRANSESOPHAGEAL ECHOCARDIOGRAM (TEE);  Surgeon: Larey Dresser, MD;  Location: [place];  Service: Cardiovascular;  Laterality: N/A;  . Cardioversion N/A 01/21/2013    Procedure: CARDIOVERSION;  Surgeon: Larey Dresser, MD;  Location: [place];  Service: Cardiovascular;  Laterality: N/A;  . Left heart catheterization with coronary angiogram N/A 12/10/2012    Procedure: LEFT HEART CATHETERIZATION WITH CORONARY ANGIOGRAM;  Surgeon: Larey Dresser, MD;  Location: [facility] CATH LAB;  Service: Cardiovascular;  Laterality: N/A;   Family History  Problem Relation Age of Onset  . Heart attack Father   . Heart failure Father 14    S/P  Turp  . Heart disease Father 58    MI.Marland KitchenMarland KitchenCHF S/P TURP @ 39  . Stroke Brother 48  . Coronary artery disease Brother   . Lung cancer Brother    Social History  Substance Use Topics  . Smoking status: Former Smoker    Quit date: 08/22/1983  . Smokeless tobacco: None  . Alcohol Use: No    Review of Systems  Constitutional: Positive for appetite change and fatigue. Negative for chills.  HENT: Positive for hearing loss. Negative for sore throat.   Respiratory: Negative for cough, chest tightness and shortness of breath.   Cardiovascular: Negative for chest pain and leg  swelling.  Gastrointestinal: Positive for vomiting and abdominal pain. Negative for diarrhea and blood in stool.  Endocrine: Negative for cold intolerance and heat intolerance.  Genitourinary: Negative for dysuria and hematuria.  Musculoskeletal: Positive for neck pain. Negative for back pain.  Skin: Positive for pallor. Negative for rash.  Neurological: Negative for dizziness and light-headedness.  Psychiatric/Behavioral: Negative for dysphoric mood. The patient is not nervous/anxious.     Allergies  Darifenacin hydrobromide and Fluoxetine hcl  Home Medications   Prior to Admission medications   Medication Sig Start Date End Date Taking? Authorizing Provider  acetaminophen (TYLENOL) 325 MG tablet Take 650 mg by mouth every 6 (six) hours as needed for moderate pain (pain).     Historical Provider, MD  amiodarone (PACERONE) 200 MG tablet Take 0.5 tablets (100 mg total) by mouth daily. 04/19/15   Larey Dresser, MD  buPROPion (WELLBUTRIN) 75 MG tablet TAKE 1 TABLET BY MOUTH 2 TIMES DAILY. 03/23/15   Hendricks Limes, MD  buPROPion (WELLBUTRIN) 75 MG tablet Take 1 tablet (75 mg total) by mouth 2 (two) times daily. 06/10/15   Hendricks Limes, MD  cephALEXin (KEFLEX) 500 MG capsule Take 1 capsule (500 mg total) by mouth 2 (two) times daily. Patient not taking: Reported on 11/13/2014 05/20/14   Davonna Belling, MD  clonazePAM Bobbye [place]) 0.5 MG tablet Take 1/2 tablet BID prn High risk of falls with this medication 05/28/15   Hendricks Limes, MD  finasteride (PROSCAR) 5 MG tablet Take 1 tablet (5 mg total) by mouth daily. 12/20/12   Dayna N Dunn, PA-C  furosemide (LASIX) 40 MG tablet TAKE 1 TABLET (40 MG TOTAL) BY MOUTH EVERY OTHER DAY. 09/25/14   Larey Dresser, MD  furosemide (LASIX) 40 MG tablet Take 1 tablet (40 mg total) by mouth every other day. 04/19/15   Larey Dresser, MD  l-methylfolate-B6-B12 (METANX) 3-35-2 MG TABS tablet TAKE 1 TABLET BY MOUTH 2 (TWO) TIMES DAILY. 10/20/15   Wallene Huh,  DPM  lisinopril (PRINIVIL,ZESTRIL) 5 MG tablet TAKE 1 TABLET (5 MG TOTAL) BY MOUTH DAILY. 07/29/15   Larey Dresser, MD  Meth-Hyo-M Barnett Hatter Phos-Ph Sal (URIBEL PO) Take 1 capsule by mouth daily as needed (bladder spasm).     Historical Provider, MD  Multiple Vitamin (MULTIVITAMIN) tablet Take 1 tablet by mouth daily.    Historical Provider, MD  phenazopyridine (PYRIDIUM) 100 MG tablet Take 100 mg by mouth 3 (three) times daily as needed for pain (bladder spasms).     Historical Provider, MD  pravastatin (PRAVACHOL) 80 MG tablet TAKE 1 TABLET (80 MG TOTAL) BY MOUTH DAILY. 07/13/15   Larey Dresser, MD  Probiotic Product (ALIGN) 4 MG CAPS Take 1 capsule by mouth daily.     Historical Provider, MD  sulfamethoxazole-trimethoprim (BACTRIM DS,SEPTRA DS) 800-160 MG per tablet Take 1  tablet by mouth 2 (two) times daily. 11/13/14   Davonna Belling, MD  warfarin (COUMADIN) 2.5 MG tablet TAKE AS DIRECTED BY ANTICOAGULATION CLINIC 02/17/15   Larey Dresser, MD   BP 89/39 mmHg  Pulse 64  Temp(Src) 98 F (36.7 C) (Rectal)  Resp 20  Ht 6' (1.829 m)  Wt 81.647 kg  BMI 24.41 kg/m2  SpO2 98% Physical Exam  Constitutional: He is oriented to person, place, and time. He appears well-developed and well-nourished. No distress.  HENT:  Head: Normocephalic.  Sessile, elongated dark growth from left brow. Dry mucous membranes.  Eyes: EOM are normal. Pupils are equal, round, and reactive to light. No scleral icterus.  Neck: Normal range of motion. Neck supple. No JVD present.  Cardiovascular: Normal rate, regular rhythm and normal heart sounds.   No murmur heard. Pulmonary/Chest: Effort normal and breath sounds normal. No respiratory distress. He has no wheezes. He has no rales.  Abdominal: Soft. Bowel sounds are normal. He exhibits no distension and no mass. There is no rebound.  TTP in the left upper quadrant.  Musculoskeletal: Normal range of motion. He exhibits no edema.  Neurological: He is alert and  oriented to person, place, and time.  Skin: Skin is dry. He is not diaphoretic.  Cold extremities with delayed capillary refill.   Psychiatric: He has a normal mood and affect. His behavior is normal.  Vitals reviewed.   ED Course  Procedures (including critical care time) Labs Review Labs Reviewed  COMPREHENSIVE METABOLIC PANEL  CBC  PROTIME-INR  POC OCCULT BLOOD, ED  TYPE AND SCREEN   HEMOGLOBIN  Date Value Ref Range Status  10/22/2015 9.5* 13.0 - 17.0 g/dL Final  11/13/2014 11.6* 13.0 - 17.0 g/dL Final  11/13/2014 10.6* 13.0 - 17.0 g/dL Final  05/20/2014 12.2* 13.0 - 17.0 g/dL Final   INR  Date Value Ref Range Status  10/22/2015 4.63* 0.00 - 1.49 Final  09/24/2015 2.7  Final    Comment:    [facility]  09/03/2015 2.3  Final    Comment:    [facility]  08/20/2015 2.8  Final    Comment:    scott with [facility]  08/10/2015 3.4  Final    Comment:    [facility]   11/13/2014 2.01* 0.00 - 1.49 Final  05/20/2014 2.30* 0.00 - 1.49 Final  12/25/2012 2.0* 0.9 - 1.1 Final   BMP Latest Ref Rng 10/22/2015 11/13/2014 05/20/2014  Glucose 65 - 99 mg/dL 118(H) 116(H) 112(H)  BUN 6 - 20 mg/dL 45(H) 33(H) 43(H)  Creatinine 0.61 - 1.24 mg/dL 3.41(H) 1.20 2.20(H)  Sodium 135 - 145 mmol/L 135 139 138  Potassium 3.5 - 5.1 mmol/L 3.9 4.6 4.3  Chloride 101 - 111 mmol/L 102 104 105  CO2 22 - 32 mmol/L 20(L) - -  Calcium 8.9 - 10.3 mg/dL 8.7(L) - -      Imaging Review No results found. I have personally reviewed and evaluated these images and lab results as part of my medical decision-making.   EKG Interpretation None      MDM   Final diagnoses:  None   As demonstrated by elevated BUN, Hgb under baseline of >11, history c/w coffee ground emesis, and elevated INR to 4.6, the patient almost certainly has an upper GI bleed, perhaps due to a gastric ulcer. FOBT negative. While he is hypotensive, he is largely stable and not actively bleeding. We will reverse his INR with FFP and 5 mg IV  Vitamin K. His Hgb does not  meet a transfusion threshold at 9.5; however, I will fluid resuscitate 1 L and 75 cc/hr thereafter. Will monitor for dyspnea as his last recorded EF is 30-35%, but in the setting of atrial fibrillation. Mr. Criste also was noted to have an AKI, certainly pre-renal in setting of hypovolemia, with Cr 3.4 from BL of 1.2.   I spoke with Dr. Oletta Lamas from [place], who indicated that an EGD would occur over the weekend. Internal medicine (unassigned) was consulted. Admitted to telemetry bed.  Liberty Handy, MD 10/22/15 1640  Liberty Handy, MD 10/22/15 [place], MD 10/22/15 1757

## 2015-10-22 NOTE — Consult Note (Signed)
Referring Provider: Dr. Tyrell Antonio Primary Care Physician:  No primary care provider on file. Primary Gastroenterologist:  Althia Forts  Reason for Consultation:  Hematemesis  HPI: [name] is a 82 y.o. male who vomited up black fluid last night X 1 and has been having left-sided pain since middle of the week. Black vomit covered his chest and also went onto his blanket and on the floor and his daughter saw it after it had occurred. Denies epigastric pain. Reports chronic abdominal bloating. Denies melena or hematochezia. Poor PO intake per his daughter who is bedside. He denies ever vomiting blood up before and thinks he had an EGD in the past but is not sure. Reports colonoscopy over 10 years ago but records not available. INR 4.6 on chronic Coumadin. Hgb 9.5. Hemoccult negative. Denies any recent Abx (daughter reports last Abx was in 06/2015). Patient is oriented X 3.   Past Medical History  Diagnosis Date  . Anemia     nos  . History of colonic polyps   . Hyperlipidemia   . Hypertension   . Cancer ([place]) [id number]    hx of basal cell  . BPH (benign prostatic hyperplasia)   . Anxiety attack     panic   . Pre-syncope     echo 03/2010,with EF 60%,mild RV dilation,no significant abnormalities...event monitor for 3wks/episodes os sinus bradycardia with heart rate to low 40's occasionally at night(suspect while sleeping)  . Atrial fibrillation ([place])     a. Dx 11/2012 - TEE with possible LAA thrombus, started on amio/coumadin.  . Systolic CHF ([place])     a. EF 25-30% 11/2012 ?tachy-mediated. b. Not on ACEI at dc due to AKI, can consider as OP.  [name] CAD (coronary artery disease)     a. Mod CAD by cath 12/10/12 - 60% long proximal LAD, 60% prox PDA.  [name] AKI (acute kidney injury) ([place])     a. Peak Cr 4.17 11/2012 - felt to be post-obstructive.  . Urinary retention     a. Post obstructive, dc'd with foley 12/2012.  [name] Renal cyst     a. By renal US 11/2012, not candidate for further eval.  .  Thrombus of left atrial appendage     a. Possible LAA thrombus by TEE 11/2012.    Past Surgical History  Procedure Laterality Date  . Colonoscopy w/ polypectomy  2007  . Cholecystectomy    . Total knee arthroplasty    . Mohs surgery  2010  . Tee without cardioversion N/A 12/11/2012    Procedure: TRANSESOPHAGEAL ECHOCARDIOGRAM (TEE);  Surgeon: Thayer Headings, MD;  Location: [place];  Service: Cardiovascular;  Laterality: N/A;  ja/bev.  Darden Dates without cardioversion N/A 01/21/2013    Procedure: TRANSESOPHAGEAL ECHOCARDIOGRAM (TEE);  Surgeon: Larey Dresser, MD;  Location: [place];  Service: Cardiovascular;  Laterality: N/A;  . Cardioversion N/A 01/21/2013    Procedure: CARDIOVERSION;  Surgeon: Larey Dresser, MD;  Location: [place];  Service: Cardiovascular;  Laterality: N/A;  . Left heart catheterization with coronary angiogram N/A 12/10/2012    Procedure: LEFT HEART CATHETERIZATION WITH CORONARY ANGIOGRAM;  Surgeon: Larey Dresser, MD;  Location[facility] C[facility] LAB;  Service: Cardiovascular;  Laterality: N/A;    Prior to Admission medications   Medication Sig Start Date End Date Taking? Authorizing Provider  acetaminophen (TYLENOL) 325 MG tablet Take 650 mg by mouth every 6 (six) hours as needed for moderate pain (pain).    Yes Historical Provider, MD  amiodarone (PACERONE) 200  MG tablet Take 0.5 tablets (100 mg total) by mouth daily. 04/19/15  Yes Larey Dresser, MD  buPROPion (WELLBUTRIN) 75 MG tablet TAKE 1 TABLET BY MOUTH 2 TIMES DAILY. 03/23/15  Yes Hendricks Limes, MD  ferrous sulfate 325 (65 FE) MG tablet Take 325 mg by mouth at bedtime.   Yes Historical Provider, MD  finasteride (PROSCAR) 5 MG tablet Take 1 tablet (5 mg total) by mouth daily. 12/20/12  Yes Dayna N Dunn, PA-C  furosemide (LASIX) 40 MG tablet TAKE 1 TABLET (40 MG TOTAL) BY MOUTH EVERY OTHER DAY. 09/25/14  Yes Larey Dresser, MD  l-methylfolate-B6-B12 (METANX) 3-35-2 MG TABS tablet Take 1 tablet by mouth 2 (two) times  daily.   Yes Historical Provider, MD  lisinopril (PRINIVIL,ZESTRIL) 5 MG tablet TAKE 1 TABLET (5 MG TOTAL) BY MOUTH DAILY. 07/29/15  Yes Larey Dresser, MD  Meth-Hyo-M Barnett Hatter Phos-Ph Sal (URIBEL PO) Take 1 capsule by mouth daily as needed (bladder spasm).    Yes Historical Provider, MD  mirabegron ER (MYRBETRIQ) 25 MG TB24 tablet Take 25 mg by mouth daily.   Yes Historical Provider, MD  Multiple Vitamin (MULTIVITAMIN) tablet Take 1 tablet by mouth daily.   Yes Historical Provider, MD  phenazopyridine (PYRIDIUM) 100 MG tablet Take 100 mg by mouth 3 (three) times daily as needed for pain (bladder spasms).    Yes Historical Provider, MD  pravastatin (PRAVACHOL) 80 MG tablet TAKE 1 TABLET (80 MG TOTAL) BY MOUTH DAILY. 07/13/15  Yes Larey Dresser, MD  Probiotic Product (ALIGN) 4 MG CAPS Take 1 capsule by mouth daily.    Yes Historical Provider, MD  warfarin (COUMADIN) 2.5 MG tablet TAKE AS DIRECTED BY ANTICOAGULATION CLINIC 02/17/15  Yes Larey Dresser, MD  warfarin (COUMADIN) 2.5 MG tablet Take 1.25-2.5 mg by mouth daily. Take 1.25mg  on Sunday,Tuesdays, and Thursdays. Then take 2.5mg  on Monday, Wednesdays, Fridays and Saturdays per daughter   Yes Historical Provider, MD  buPROPion (WELLBUTRIN) 75 MG tablet Take 1 tablet (75 mg total) by mouth 2 (two) times daily. Patient not taking: Reported on 10/22/2015 06/10/15   Hendricks Limes, MD  cephALEXin (KEFLEX) 500 MG capsule Take 1 capsule (500 mg total) by mouth 2 (two) times daily. Patient not taking: Reported on 11/13/2014 05/20/14   Davonna Belling, MD  furosemide (LASIX) 40 MG tablet Take 1 tablet (40 mg total) by mouth every other day. Patient not taking: Reported on 10/22/2015 04/19/15   Larey Dresser, MD  l-methylfolate-B6-B12 (METANX) 3-35-2 MG TABS tablet TAKE 1 TABLET BY MOUTH 2 (TWO) TIMES DAILY. Patient not taking: Reported on 10/22/2015 10/20/15   Wallene Huh, DPM  sulfamethoxazole-trimethoprim (BACTRIM DS,SEPTRA DS) 800-160 MG per tablet Take 1  tablet by mouth 2 (two) times daily. Patient not taking: Reported on 10/22/2015 11/13/14   Davonna Belling, MD    Scheduled Meds: . [START ON 10/26/2015] pantoprazole (PROTONIX) IV  40 mg Intravenous Q12H   Continuous Infusions: . pantoprozole (PROTONIX) infusion 8 mg/hr (10/22/15 1856)   PRN Meds:.  Allergies as of 10/22/2015 - Review Complete 10/22/2015  Allergen Reaction Noted  . Darifenacin hydrobromide  08/31/2010  . Fluoxetine hcl      Family History  Problem Relation Age of Onset  . Heart attack Father   . Heart failure Father 46    S/P Turp  . Heart disease Father 44    MI.Marland KitchenMarland KitchenCHF S/P TURP @ 27  . Stroke Brother 43  . Coronary artery disease Brother   .  Lung cancer Brother     Social History   Social History  . Marital Status: Married    Spouse Name: N/A  . Number of Children: N/A  . Years of Education: N/A   Occupational History  . Not on file.   Social History Main Topics  . Smoking status: Former Smoker    Quit date: 08/22/1983  . Smokeless tobacco: Not on file  . Alcohol Use: No  . Drug Use: No  . Sexual Activity: Not on file   Other Topics Concern  . Not on file   Social History Narrative   MARRIED   REGULAR EXERCISE--YES WALKS 15 MINS 4-5X WK    Review of Systems: All negative except as stated above in HPI.  Physical Exam: Vital signs: Filed Vitals:   10/22/15 1845 10/22/15 2015  BP: 106/47 117/55  Pulse: 67 84  Temp: 97.9   Resp: 22 23     General:   Lethargic, elderly, Well-developed, well-nourished, pleasant and cooperative in NAD Head: atraumatic Eyes: anicteric sclera ENT: oropharynx clear Neck: supple, nontender Lungs:  Clear throughout to auscultation.   No wheezes, crackles, or rhonchi. No acute distress. Heart:  Regular rate and rhythm; no murmurs, clicks, rubs,  or gallops. Abdomen: soft, nondistended, nontender, +BS  Rectal:  Deferred Ext: no edema Neuro: lethargic, oriented X 3  GI:  Lab Results:  Recent Labs   10/22/15 1424  WBC 34.1*  HGB 9.5*  HCT 30.7*  PLT 273   BMET  Recent Labs  10/22/15 1424  NA 135  K 3.9  CL 102  CO2 20*  GLUCOSE 118*  BUN 45*  CREATININE 3.41*  CALCIUM 8.7*   LFT  Recent Labs  10/22/15 1424  PROT 6.9  ALBUMIN 2.5*  AST 39  ALT 29  ALKPHOS 117  BILITOT 0.6   PT/INR  Recent Labs  10/22/15 1424  LABPROT 42.4*  INR 4.63*     Studies/Results: Dg Chest Port 1 View  10/22/2015  CLINICAL DATA:  82 year old male with left-sided chest pain x1 day. Leukocytosis. EXAM: PORTABLE CHEST 1 VIEW COMPARISON:  Radiograph dated 12/06/2012 FINDINGS: Single portable view of the chest demonstrate mild increased interstitial and vascular prominence likely minimal congestive changes. An area of apparent increased density at the left lung base over the cardiac silhouette may represent mild atelectatic changes. Pneumonia is not excluded. Clinical correlation is recommended. There is no pleural effusion, or pneumothorax. The cardiac silhouette is within normal limits. No acute osseous pathology. IMPRESSION: Mild congestive changes. Faint area of increased density at the left lung base may represent atelectatic changes. Pneumonia is not excluded. Clinical correlation and follow-up recommended. Electronically Signed   By: Anner Crete M.D.   On: 10/22/2015 18:15   Dg Abd Portable 1v  10/22/2015  CLINICAL DATA:  Abdominal distention for 1 day EXAM: PORTABLE ABDOMEN - 1 VIEW COMPARISON:  None. FINDINGS: There is no bowel dilation to suggest obstruction or significant adynamic ileus. Soft tissues are unremarkable.  Bony structures are demineralized. IMPRESSION: 1. No acute finding. No evidence of bowel obstruction or significant adynamic ileus. Electronically Signed   By: Lajean Manes M.D.   On: 10/22/2015 17:52    Impression/Plan: 82 yo with an episode of black vomitus in the setting of a supratherapeutic INR. Question peptic ulcer bleed vs. mucosal bleeding from elevated  INR. Malignancy possible as well. INR corrected with FFP X 2 today. Recheck INR in AM. EGD tomorrow and risks/benefits discussed and he agrees to proceed.  NPO. Supportive care.    LOS: 0 days   [place] C.  10/22/2015, 8:44 PM  Pager [phone]  If no answer or after 5 PM call [phone]

## 2015-10-22 NOTE — ED Notes (Signed)
Admitting paged. States pt still meets criteria to need step down admission.

## 2015-10-23 ENCOUNTER — Encounter (HOSPITAL_COMMUNITY): Payer: Self-pay

## 2015-10-23 ENCOUNTER — Encounter (HOSPITAL_COMMUNITY)
Admission: EM | Disposition: A | Discharge status: Home / Self Care | Payer: Self-pay | Source: Home / Self Care | Attending: Internal Medicine

## 2015-10-23 DIAGNOSIS — I10 Essential (primary) hypertension: Secondary | ICD-10-CM

## 2015-10-23 DIAGNOSIS — Z7901 Long term (current) use of anticoagulants: Secondary | ICD-10-CM

## 2015-10-23 HISTORY — PX: ESOPHAGOGASTRODUODENOSCOPY: SHX5428

## 2015-10-23 LAB — CBC
HCT: 25.3 % — ABNORMAL LOW (ref 39.0–52.0)
HCT: 23 % — ABNORMAL LOW (ref 39.0–52.0)
HEMATOCRIT: 25.6 % — AB (ref 39.0–52.0)
HEMOGLOBIN: 8.4 g/dL — AB (ref 13.0–17.0)
Hemoglobin: 8.3 g/dL — ABNORMAL LOW (ref 13.0–17.0)
Hemoglobin: 7.3 g/dL — ABNORMAL LOW (ref 13.0–17.0)
MCH: 29.1 pg (ref 26.0–34.0)
MCH: 29.3 pg (ref 26.0–34.0)
MCH: 28.1 pg (ref 26.0–34.0)
MCHC: 32.8 g/dL (ref 30.0–36.0)
MCHC: 32.8 g/dL (ref 30.0–36.0)
MCHC: 31.7 g/dL (ref 30.0–36.0)
MCV: 88.8 fL (ref 78.0–100.0)
MCV: 89.2 fL (ref 78.0–100.0)
MCV: 88.5 fL (ref 78.0–100.0)
PLATELETS: 221 10*3/uL (ref 150–400)
Platelets: 251 10*3/uL (ref 150–400)
Platelets: 171 10*3/uL (ref 150–400)
RBC: 2.85 MIL/uL — AB (ref 4.22–5.81)
RBC: 2.87 MIL/uL — AB (ref 4.22–5.81)
RBC: 2.6 MIL/uL — ABNORMAL LOW (ref 4.22–5.81)
RDW: 16.2 % — ABNORMAL HIGH (ref 11.5–15.5)
RDW: 16.4 % — ABNORMAL HIGH (ref 11.5–15.5)
RDW: 16.3 % — AB (ref 11.5–15.5)
WBC: 23 10*3/uL — AB (ref 4.0–10.5)
WBC: 19.7 10*3/uL — ABNORMAL HIGH (ref 4.0–10.5)
WBC: 15.2 10*3/uL — AB (ref 4.0–10.5)

## 2015-10-23 LAB — PREPARE FRESH FROZEN PLASMA
UNIT DIVISION: 0
UNIT DIVISION: 0

## 2015-10-23 LAB — GLUCOSE, CAPILLARY: GLUCOSE-CAPILLARY: 105 mg/dL — AB (ref 65–99)

## 2015-10-23 LAB — PROTIME-INR
INR: 1.75 — ABNORMAL HIGH (ref 0.00–1.49)
Prothrombin Time: 20.4 seconds — ABNORMAL HIGH (ref 11.6–15.2)

## 2015-10-23 LAB — MRSA PCR SCREENING: MRSA BY PCR: POSITIVE — AB

## 2015-10-23 LAB — BASIC METABOLIC PANEL
ANION GAP: 13 (ref 5–15)
BUN: 43 mg/dL — ABNORMAL HIGH (ref 6–20)
CALCIUM: 8.7 mg/dL — AB (ref 8.9–10.3)
CO2: 21 mmol/L — ABNORMAL LOW (ref 22–32)
Chloride: 103 mmol/L (ref 101–111)
Creatinine, Ser: 2.68 mg/dL — ABNORMAL HIGH (ref 0.61–1.24)
GFR calc Af Amer: 23 mL/min — ABNORMAL LOW (ref >60)
GFR calc non Af Amer: 20 mL/min — ABNORMAL LOW (ref >60)
GLUCOSE: 101 mg/dL — AB (ref 65–99)
Potassium: 3.9 mmol/L (ref 3.5–5.1)
Sodium: 137 mmol/L (ref 135–145)

## 2015-10-23 SURGERY — EGD (ESOPHAGOGASTRODUODENOSCOPY)
Anesthesia: Moderate Sedation

## 2015-10-23 MED ORDER — L-METHYLFOLATE-B6-B12 3-35-2 MG PO TABS
1.0000 | ORAL_TABLET | Freq: Two times a day (BID) | ORAL | Status: DC
  Administered 2015-10-23 – 2015-10-28 (×10): 1 via ORAL
  Filled 2015-10-23 (×17): qty 1

## 2015-10-23 MED ORDER — FENTANYL CITRATE (PF) 100 MCG/2ML IJ SOLN
INTRAMUSCULAR | Status: DC | PRN
  Administered 2015-10-23: 12.5 ug via INTRAVENOUS

## 2015-10-23 MED ORDER — SODIUM CHLORIDE 0.9% FLUSH
3.0000 mL | Freq: Two times a day (BID) | INTRAVENOUS | Status: DC
  Administered 2015-10-23 – 2015-10-28 (×8): 3 mL via INTRAVENOUS

## 2015-10-23 MED ORDER — PNEUMOCOCCAL VAC POLYVALENT 25 MCG/0.5ML IJ INJ
0.5000 mL | INJECTION | INTRAMUSCULAR | Status: AC
  Filled 2015-10-23: qty 1
  Filled 2015-10-23: qty 0.5

## 2015-10-23 MED ORDER — SODIUM CHLORIDE 0.9 % IV SOLN: 250.0000 mL | INTRAVENOUS | Status: DC | PRN

## 2015-10-23 MED ORDER — BUPROPION HCL 75 MG PO TABS
75.0000 mg | ORAL_TABLET | Freq: Two times a day (BID) | ORAL | Status: DC
  Administered 2015-10-23 – 2015-10-28 (×11): 75 mg via ORAL
  Filled 2015-10-23 (×13): qty 1

## 2015-10-23 MED ORDER — DEXTROSE 5 % IV SOLN
500.0000 mg | INTRAVENOUS | Status: DC
  Administered 2015-10-23 – 2015-10-28 (×6): 500 mg via INTRAVENOUS
  Filled 2015-10-23 (×6): qty 500

## 2015-10-23 MED ORDER — FENTANYL CITRATE (PF) 100 MCG/2ML IJ SOLN
INTRAMUSCULAR | Status: AC
  Filled 2015-10-23: qty 2

## 2015-10-23 MED ORDER — PRAVASTATIN SODIUM 40 MG PO TABS
80.0000 mg | ORAL_TABLET | Freq: Every day | ORAL | Status: DC
  Administered 2015-10-23 – 2015-10-27 (×5): 80 mg via ORAL
  Filled 2015-10-23 (×3): qty 1
  Filled 2015-10-23 (×2): qty 2
  Filled 2015-10-23: qty 1

## 2015-10-23 MED ORDER — PHENAZOPYRIDINE HCL 100 MG PO TABS
100.0000 mg | ORAL_TABLET | Freq: Three times a day (TID) | ORAL | Status: DC | PRN
  Administered 2015-10-27: 100 mg via ORAL
  Filled 2015-10-23 (×2): qty 1

## 2015-10-23 MED ORDER — MIDAZOLAM HCL 10 MG/2ML IJ SOLN
INTRAMUSCULAR | Status: DC | PRN
  Administered 2015-10-23: 1 mg via INTRAVENOUS

## 2015-10-23 MED ORDER — SODIUM CHLORIDE 0.9 % IV SOLN: INTRAVENOUS | Status: DC

## 2015-10-23 MED ORDER — CHLORHEXIDINE GLUCONATE CLOTH 2 % EX PADS
6.0000 | MEDICATED_PAD | Freq: Every day | CUTANEOUS | Status: DC
  Administered 2015-10-24 – 2015-10-28 (×4): 6 via TOPICAL

## 2015-10-23 MED ORDER — SODIUM CHLORIDE 0.9% FLUSH: 3.0000 mL | INTRAVENOUS | Status: DC | PRN

## 2015-10-23 MED ORDER — AMIODARONE HCL 200 MG PO TABS
100.0000 mg | ORAL_TABLET | Freq: Every day | ORAL | Status: DC
  Administered 2015-10-23 – 2015-10-28 (×6): 100 mg via ORAL
  Filled 2015-10-23 (×10): qty 1

## 2015-10-23 MED ORDER — DEXTROSE 5 % IV SOLN
1.0000 g | INTRAVENOUS | Status: DC
  Administered 2015-10-23 – 2015-10-28 (×6): 1 g via INTRAVENOUS
  Filled 2015-10-23 (×6): qty 10

## 2015-10-23 MED ORDER — DIPHENHYDRAMINE HCL 50 MG/ML IJ SOLN
INTRAMUSCULAR | Status: AC
  Filled 2015-10-23: qty 1

## 2015-10-23 MED ORDER — ACETAMINOPHEN 325 MG PO TABS
650.0000 mg | ORAL_TABLET | Freq: Four times a day (QID) | ORAL | Status: DC | PRN
  Administered 2015-10-27 – 2015-10-28 (×2): 650 mg via ORAL
  Filled 2015-10-23 (×2): qty 2

## 2015-10-23 MED ORDER — MUPIROCIN 2 % EX OINT
1.0000 "application " | TOPICAL_OINTMENT | Freq: Two times a day (BID) | CUTANEOUS | Status: AC
  Administered 2015-10-23 – 2015-10-28 (×10): 1 via NASAL
  Filled 2015-10-23 (×2): qty 22

## 2015-10-23 MED ORDER — BUTAMBEN-TETRACAINE-BENZOCAINE 2-2-14 % EX AERO
INHALATION_SPRAY | CUTANEOUS | Status: DC | PRN
  Administered 2015-10-23: 1 via TOPICAL

## 2015-10-23 MED ORDER — RISAQUAD PO CAPS
1.0000 | ORAL_CAPSULE | Freq: Every day | ORAL | Status: DC
  Administered 2015-10-23 – 2015-10-28 (×7): 1 via ORAL
  Filled 2015-10-23 (×9): qty 1

## 2015-10-23 MED ORDER — MIRABEGRON ER 25 MG PO TB24
25.0000 mg | ORAL_TABLET | Freq: Every day | ORAL | Status: DC
  Administered 2015-10-23 – 2015-10-28 (×6): 25 mg via ORAL
  Filled 2015-10-23 (×13): qty 1

## 2015-10-23 MED ORDER — SODIUM CHLORIDE 0.9 % IV SOLN
INTRAVENOUS | Status: DC
  Administered 2015-10-23 – 2015-10-25 (×2): via INTRAVENOUS

## 2015-10-23 MED ORDER — MIDAZOLAM HCL 5 MG/ML IJ SOLN
INTRAMUSCULAR | Status: AC
  Filled 2015-10-23: qty 2

## 2015-10-23 NOTE — H&P (View-Only) (Signed)
Referring Provider: Dr. Tyrell Antonio Primary Care Physician:  No primary care provider on file. Primary Gastroenterologist:  Althia Forts  Reason for Consultation:  Hematemesis  HPI: [name] is a 82 y.o. male who vomited up black fluid last night X 1 and has been having left-sided pain since middle of the week. Black vomit covered his chest and also went onto his blanket and on the floor and his daughter saw it after it had occurred. Denies epigastric pain. Reports chronic abdominal bloating. Denies melena or hematochezia. Poor PO intake per his daughter who is bedside. He denies ever vomiting blood up before and thinks he had an EGD in the past but is not sure. Reports colonoscopy over 10 years ago but records not available. INR 4.6 on chronic Coumadin. Hgb 9.5. Hemoccult negative. Denies any recent Abx (daughter reports last Abx was in 06/2015). Patient is oriented X 3.   Past Medical History  Diagnosis Date  . Anemia     nos  . History of colonic polyps   . Hyperlipidemia   . Hypertension   . Cancer ([place]) [id number]    hx of basal cell  . BPH (benign prostatic hyperplasia)   . Anxiety attack     panic   . Pre-syncope     echo 03/2010,with EF 60%,mild RV dilation,no significant abnormalities...event monitor for 3wks/episodes os sinus bradycardia with heart rate to low 40's occasionally at night(suspect while sleeping)  . Atrial fibrillation ([place])     a. Dx 11/2012 - TEE with possible LAA thrombus, started on amio/coumadin.  . Systolic CHF ([place])     a. EF 25-30% 11/2012 ?tachy-mediated. b. Not on ACEI at dc due to AKI, can consider as OP.  [name] CAD (coronary artery disease)     a. Mod CAD by cath 12/10/12 - 60% long proximal LAD, 60% prox PDA.  [name] AKI (acute kidney injury) ([place])     a. Peak Cr 4.17 11/2012 - felt to be post-obstructive.  . Urinary retention     a. Post obstructive, dc'd with foley 12/2012.  [name] Renal cyst     a. By renal US 11/2012, not candidate for further eval.  .  Thrombus of left atrial appendage     a. Possible LAA thrombus by TEE 11/2012.    Past Surgical History  Procedure Laterality Date  . Colonoscopy w/ polypectomy  2007  . Cholecystectomy    . Total knee arthroplasty    . Mohs surgery  2010  . Tee without cardioversion N/A 12/11/2012    Procedure: TRANSESOPHAGEAL ECHOCARDIOGRAM (TEE);  Surgeon: Thayer Headings, MD;  Location: [place];  Service: Cardiovascular;  Laterality: N/A;  ja/bev.  Darden Dates without cardioversion N/A 01/21/2013    Procedure: TRANSESOPHAGEAL ECHOCARDIOGRAM (TEE);  Surgeon: Larey Dresser, MD;  Location: [place];  Service: Cardiovascular;  Laterality: N/A;  . Cardioversion N/A 01/21/2013    Procedure: CARDIOVERSION;  Surgeon: Larey Dresser, MD;  Location: [place];  Service: Cardiovascular;  Laterality: N/A;  . Left heart catheterization with coronary angiogram N/A 12/10/2012    Procedure: LEFT HEART CATHETERIZATION WITH CORONARY ANGIOGRAM;  Surgeon: Larey Dresser, MD;  Location: [facility] CATH LAB;  Service: Cardiovascular;  Laterality: N/A;    Prior to Admission medications   Medication Sig Start Date End Date Taking? Authorizing Provider  acetaminophen (TYLENOL) 325 MG tablet Take 650 mg by mouth every 6 (six) hours as needed for moderate pain (pain).    Yes Historical Provider, MD  amiodarone (PACERONE) 200  MG tablet Take 0.5 tablets (100 mg total) by mouth daily. 04/19/15  Yes Larey Dresser, MD  buPROPion (WELLBUTRIN) 75 MG tablet TAKE 1 TABLET BY MOUTH 2 TIMES DAILY. 03/23/15  Yes Hendricks Limes, MD  ferrous sulfate 325 (65 FE) MG tablet Take 325 mg by mouth at bedtime.   Yes Historical Provider, MD  finasteride (PROSCAR) 5 MG tablet Take 1 tablet (5 mg total) by mouth daily. 12/20/12  Yes Dayna N Dunn, PA-C  furosemide (LASIX) 40 MG tablet TAKE 1 TABLET (40 MG TOTAL) BY MOUTH EVERY OTHER DAY. 09/25/14  Yes Larey Dresser, MD  l-methylfolate-B6-B12 (METANX) 3-35-2 MG TABS tablet Take 1 tablet by mouth 2 (two) times  daily.   Yes Historical Provider, MD  lisinopril (PRINIVIL,ZESTRIL) 5 MG tablet TAKE 1 TABLET (5 MG TOTAL) BY MOUTH DAILY. 07/29/15  Yes Larey Dresser, MD  Meth-Hyo-M Barnett Hatter Phos-Ph Sal (URIBEL PO) Take 1 capsule by mouth daily as needed (bladder spasm).    Yes Historical Provider, MD  mirabegron ER (MYRBETRIQ) 25 MG TB24 tablet Take 25 mg by mouth daily.   Yes Historical Provider, MD  Multiple Vitamin (MULTIVITAMIN) tablet Take 1 tablet by mouth daily.   Yes Historical Provider, MD  phenazopyridine (PYRIDIUM) 100 MG tablet Take 100 mg by mouth 3 (three) times daily as needed for pain (bladder spasms).    Yes Historical Provider, MD  pravastatin (PRAVACHOL) 80 MG tablet TAKE 1 TABLET (80 MG TOTAL) BY MOUTH DAILY. 07/13/15  Yes Larey Dresser, MD  Probiotic Product (ALIGN) 4 MG CAPS Take 1 capsule by mouth daily.    Yes Historical Provider, MD  warfarin (COUMADIN) 2.5 MG tablet TAKE AS DIRECTED BY ANTICOAGULATION CLINIC 02/17/15  Yes Larey Dresser, MD  warfarin (COUMADIN) 2.5 MG tablet Take 1.25-2.5 mg by mouth daily. Take 1.25mg  on Sunday,Tuesdays, and Thursdays. Then take 2.5mg  on Monday, Wednesdays, Fridays and Saturdays per daughter   Yes Historical Provider, MD  buPROPion (WELLBUTRIN) 75 MG tablet Take 1 tablet (75 mg total) by mouth 2 (two) times daily. Patient not taking: Reported on 10/22/2015 06/10/15   Hendricks Limes, MD  cephALEXin (KEFLEX) 500 MG capsule Take 1 capsule (500 mg total) by mouth 2 (two) times daily. Patient not taking: Reported on 11/13/2014 05/20/14   Davonna Belling, MD  furosemide (LASIX) 40 MG tablet Take 1 tablet (40 mg total) by mouth every other day. Patient not taking: Reported on 10/22/2015 04/19/15   Larey Dresser, MD  l-methylfolate-B6-B12 (METANX) 3-35-2 MG TABS tablet TAKE 1 TABLET BY MOUTH 2 (TWO) TIMES DAILY. Patient not taking: Reported on 10/22/2015 10/20/15   Wallene Huh, DPM  sulfamethoxazole-trimethoprim (BACTRIM DS,SEPTRA DS) 800-160 MG per tablet Take 1  tablet by mouth 2 (two) times daily. Patient not taking: Reported on 10/22/2015 11/13/14   Davonna Belling, MD    Scheduled Meds: . [START ON 10/26/2015] pantoprazole (PROTONIX) IV  40 mg Intravenous Q12H   Continuous Infusions: . pantoprozole (PROTONIX) infusion 8 mg/hr (10/22/15 1856)   PRN Meds:.  Allergies as of 10/22/2015 - Review Complete 10/22/2015  Allergen Reaction Noted  . Darifenacin hydrobromide  08/31/2010  . Fluoxetine hcl      Family History  Problem Relation Age of Onset  . Heart attack Father   . Heart failure Father 44    S/P Turp  . Heart disease Father 85    MI.Marland KitchenMarland KitchenCHF S/P TURP @ 63  . Stroke Brother 64  . Coronary artery disease Brother   .  Lung cancer Brother     Social History   Social History  . Marital Status: Married    Spouse Name: N/A  . Number of Children: N/A  . Years of Education: N/A   Occupational History  . Not on file.   Social History Main Topics  . Smoking status: Former Smoker    Quit date: 08/22/1983  . Smokeless tobacco: Not on file  . Alcohol Use: No  . Drug Use: No  . Sexual Activity: Not on file   Other Topics Concern  . Not on file   Social History Narrative   MARRIED   REGULAR EXERCISE--YES WALKS 15 MINS 4-5X WK    Review of Systems: All negative except as stated above in HPI.  Physical Exam: Vital signs: Filed Vitals:   10/22/15 1845 10/22/15 2015  BP: 106/47 117/55  Pulse: 67 84  Temp: 97.9   Resp: 22 23     General:   Lethargic, elderly, Well-developed, well-nourished, pleasant and cooperative in NAD Head: atraumatic Eyes: anicteric sclera ENT: oropharynx clear Neck: supple, nontender Lungs:  Clear throughout to auscultation.   No wheezes, crackles, or rhonchi. No acute distress. Heart:  Regular rate and rhythm; no murmurs, clicks, rubs,  or gallops. Abdomen: soft, nondistended, nontender, +BS  Rectal:  Deferred Ext: no edema Neuro: lethargic, oriented X 3  GI:  Lab Results:  Recent Labs   10/22/15 1424  WBC 34.1*  HGB 9.5*  HCT 30.7*  PLT 273   BMET  Recent Labs  10/22/15 1424  NA 135  K 3.9  CL 102  CO2 20*  GLUCOSE 118*  BUN 45*  CREATININE 3.41*  CALCIUM 8.7*   LFT  Recent Labs  10/22/15 1424  PROT 6.9  ALBUMIN 2.5*  AST 39  ALT 29  ALKPHOS 117  BILITOT 0.6   PT/INR  Recent Labs  10/22/15 1424  LABPROT 42.4*  INR 4.63*     Studies/Results: Dg Chest Port 1 View  10/22/2015  CLINICAL DATA:  82 year old male with left-sided chest pain x1 day. Leukocytosis. EXAM: PORTABLE CHEST 1 VIEW COMPARISON:  Radiograph dated 12/06/2012 FINDINGS: Single portable view of the chest demonstrate mild increased interstitial and vascular prominence likely minimal congestive changes. An area of apparent increased density at the left lung base over the cardiac silhouette may represent mild atelectatic changes. Pneumonia is not excluded. Clinical correlation is recommended. There is no pleural effusion, or pneumothorax. The cardiac silhouette is within normal limits. No acute osseous pathology. IMPRESSION: Mild congestive changes. Faint area of increased density at the left lung base may represent atelectatic changes. Pneumonia is not excluded. Clinical correlation and follow-up recommended. Electronically Signed   By: Anner Crete M.D.   On: 10/22/2015 18:15   Dg Abd Portable 1v  10/22/2015  CLINICAL DATA:  Abdominal distention for 1 day EXAM: PORTABLE ABDOMEN - 1 VIEW COMPARISON:  None. FINDINGS: There is no bowel dilation to suggest obstruction or significant adynamic ileus. Soft tissues are unremarkable.  Bony structures are demineralized. IMPRESSION: 1. No acute finding. No evidence of bowel obstruction or significant adynamic ileus. Electronically Signed   By: Lajean Manes M.D.   On: 10/22/2015 17:52    Impression/Plan: 82 yo with an episode of black vomitus in the setting of a supratherapeutic INR. Question peptic ulcer bleed vs. mucosal bleeding from elevated  INR. Malignancy possible as well. INR corrected with FFP X 2 today. Recheck INR in AM. EGD tomorrow and risks/benefits discussed and he agrees to proceed.  NPO. Supportive care.    LOS: 0 days   [place] C.  10/22/2015, 8:44 PM  Pager [phone]  If no answer or after 5 PM call [phone]

## 2015-10-23 NOTE — Brief Op Note (Addendum)
No active bleeding. Mild distal esophagitis. Edematous pre-pylorus - s/p biospies. Continue to hold Coumadin. Clear liquid diet. Supportive care. Updated daughter by phone.

## 2015-10-23 NOTE — Progress Notes (Signed)
[facility] PROGRESS NOTE  [name] [id number] DOB: 1925/05/17 DOA: 10/22/2015 PCP: No primary care provider on file.  HPI/Brief narrative 82 y.o. male with PMH significant for Systolic HF Ef 30 to 35 % by ECHO 2014, BPH, chronic foley catheter, A fib on Coumadin, CKD stage III cr range 1.2 to 1.7 who presents with coffee ground emesis  Assessment/Plan: 1-GI bleed, Coffee ground emesis;  Patient presented with supra-therapeutic INR.  Coumadin on hold and patient received one unit of FFP with Vitamin K, FFP Protonix Gtt.    2-Acute on chronic renal failure, stage III. Prior cr range 1.2 to 1.7.  Suspect related to hypovolemia, hypotension, anemia Pt received IV fluids. Clear diet  Hold diuretics.  Check UA.  Continue strict I and O.  Will need to exchange foley catheter when INR is lower.  Cr is improved today  3-Leukocytosis.  No fever, repeat labs,. Check Chest x ray and UA.   4-Left side abdominal pain: check KUB.   5-Systolic HF; appears compensated.  Diuretics on hold per above No LE edema on exam  6-A fib: hold coumadin in setting of bleeding. Continue with amiodarone. Rate controlled  Code Status: Full Family Communication: Pt in room Disposition Plan: Uncertain at this time   Consultants:  GI  Procedures:  EGD 3/4  Antibiotics: Anti-infectives    Start     Dose/Rate Route Frequency Ordered Stop   10/23/15 0815  cefTRIAXone (ROCEPHIN) 1 g in dextrose 5 % 50 mL IVPB     1 g 100 mL/hr over 30 Minutes Intravenous Every 24 hours 10/23/15 0801 10/30/15 0814   10/23/15 0815  azithromycin (ZITHROMAX) 500 mg in dextrose 5 % 250 mL IVPB     500 mg 250 mL/hr over 60 Minutes Intravenous Every 24 hours 10/23/15 0801 10/30/15 0814      HPI/Subjective: No complaints this afternoon  Objective: Filed Vitals:   10/23/15 1425 10/23/15 1430 10/23/15 1435 10/23/15 1440  BP: 90/36 102/55 117/97 91/35  Pulse: 81 45 83 80  Temp:       TempSrc:      Resp: 28 34 22 29  Height:      Weight:      SpO2: 98% 92% 98% 99%    Intake/Output Summary (Last 24 hours) at 10/23/15 1547 Last data filed at 10/23/15 1200  Gross per 24 hour  Intake 951.67 ml  Output   1501 ml  Net -549.33 ml   Filed Weights   10/22/15 1426  Weight: 81.647 kg (180 lb)    Exam:   General:  Awake, in nad  Cardiovascular: regular, s1, s2  Respiratory: normal resp effort, no wheezing  Abdomen: soft,nondistended   Musculoskeletal: perfused, no clubbing   Data Reviewed: Basic Metabolic Panel:  Recent Labs Lab 10/22/15 1424 10/23/15 0155  NA 135 137  K 3.9 3.9  CL 102 103  CO2 20* 21*  GLUCOSE 118* 101*  BUN 45* 43*  CREATININE 3.41* 2.68*  CALCIUM 8.7* 8.7*   Liver Function Tests:  Recent Labs Lab 10/22/15 1424  AST 39  ALT 29  ALKPHOS 117  BILITOT 0.6  PROT 6.9  ALBUMIN 2.5*   No results for input(s): LIPASE, AMYLASE in the last 168 hours. No results for input(s): AMMONIA in the last 168 hours. CBC:  Recent Labs Lab 10/22/15 1424 10/22/15 2051 10/23/15 0155 10/23/15 0912  WBC 34.1* 24.0* 23.0* 19.7*  HGB 9.5* 9.2* 8.3* 8.4*  HCT 30.7* 29.8* 25.3* 25.6*  MCV 90.3  89.8 88.8 89.2  PLT 273 239 221 251   Cardiac Enzymes: No results for input(s): CKTOTAL, CKMB, CKMBINDEX, TROPONINI in the last 168 hours. BNP (last 3 results) No results for input(s): BNP in the last 8760 hours.  ProBNP (last 3 results) No results for input(s): PROBNP in the last 8760 hours.  CBG:  Recent Labs Lab 10/23/15 1236  GLUCAP 105*    Recent Results (from the past 240 hour(s))  Urine culture     Status: None (Preliminary result)   Collection Time: 10/22/15  7:11 PM  Result Value Ref Range Status   Specimen Description URINE, RANDOM  Final   Special Requests NONE  Final   Culture TOO YOUNG TO READ  Final   Report Status PENDING  Incomplete     Studies: Dg Chest Port 1 View  10/22/2015  CLINICAL DATA:  82 year old male  with left-sided chest pain x1 day. Leukocytosis. EXAM: PORTABLE CHEST 1 VIEW COMPARISON:  Radiograph dated 12/06/2012 FINDINGS: Single portable view of the chest demonstrate mild increased interstitial and vascular prominence likely minimal congestive changes. An area of apparent increased density at the left lung base over the cardiac silhouette may represent mild atelectatic changes. Pneumonia is not excluded. Clinical correlation is recommended. There is no pleural effusion, or pneumothorax. The cardiac silhouette is within normal limits. No acute osseous pathology. IMPRESSION: Mild congestive changes. Faint area of increased density at the left lung base may represent atelectatic changes. Pneumonia is not excluded. Clinical correlation and follow-up recommended. Electronically Signed   By: Anner Crete M.D.   On: 10/22/2015 18:15   Dg Abd Portable 1v  10/22/2015  CLINICAL DATA:  Abdominal distention for 1 day EXAM: PORTABLE ABDOMEN - 1 VIEW COMPARISON:  None. FINDINGS: There is no bowel dilation to suggest obstruction or significant adynamic ileus. Soft tissues are unremarkable.  Bony structures are demineralized. IMPRESSION: 1. No acute finding. No evidence of bowel obstruction or significant adynamic ileus. Electronically Signed   By: Lajean Manes M.D.   On: 10/22/2015 17:52    Scheduled Meds: . acidophilus  1 capsule Oral Daily  . amiodarone  100 mg Oral Daily  . azithromycin  500 mg Intravenous Q24H  . buPROPion  75 mg Oral BID WC  . cefTRIAXone (ROCEPHIN)  IV  1 g Intravenous Q24H  . l-methylfolate-B6-B12  1 tablet Oral BID  . mirabegron ER  25 mg Oral Daily  . [START ON 10/26/2015] pantoprazole (PROTONIX) IV  40 mg Intravenous Q12H  . [START ON 10/24/2015] pneumococcal 23 valent vaccine  0.5 mL Intramuscular Tomorrow-1000  . pravastatin  80 mg Oral q1800  . sodium chloride flush  3 mL Intravenous Q12H   Continuous Infusions: . sodium chloride 50 mL/hr at 10/23/15 0730  . pantoprozole  (PROTONIX) infusion 8 mg/hr (10/23/15 0841)    Principal Problem:   Upper GI bleed Active Problems:   Essential hypertension   AKI (acute kidney injury) ([place])   Chronic systolic CHF (congestive heart failure) ([place])   Long term (current) use of anticoagulants   GI bleed   Lenae Wherley, [facility] Pager [phone]. If 7PM-7AM, please contact night-coverage at [email or web address], password [facility] 10/23/2015, 3:47 PM  LOS: 1 day

## 2015-10-23 NOTE — ED Notes (Signed)
RN cleaned pt after pt's BM.

## 2015-10-23 NOTE — Interval H&P Note (Signed)
History and Physical Interval Note:  10/23/2015 2:20 PM  [name]  has presented today for surgery, with the diagnosis of GI Bleed  The various methods of treatment have been discussed with the patient and family. After consideration of risks, benefits and other options for treatment, the patient has consented to  Procedure(s): ESOPHAGOGASTRODUODENOSCOPY (EGD) (N/A) as a surgical intervention .  The patient's history has been reviewed, patient examined, no change in status, stable for surgery.  I have reviewed the patient's chart and labs.  Questions were answered to the patient's satisfaction.     Bland C.

## 2015-10-23 NOTE — ED Notes (Signed)
Obtained consent for pt's EGD today

## 2015-10-24 LAB — CBC
HEMATOCRIT: 23.9 % — AB (ref 39.0–52.0)
Hemoglobin: 7.5 g/dL — ABNORMAL LOW (ref 13.0–17.0)
MCH: 27.7 pg (ref 26.0–34.0)
MCHC: 31.4 g/dL (ref 30.0–36.0)
MCV: 88.2 fL (ref 78.0–100.0)
Platelets: 173 10*3/uL (ref 150–400)
RBC: 2.71 MIL/uL — ABNORMAL LOW (ref 4.22–5.81)
RDW: 16.4 % — ABNORMAL HIGH (ref 11.5–15.5)
WBC: 12.4 10*3/uL — ABNORMAL HIGH (ref 4.0–10.5)

## 2015-10-24 LAB — COMPREHENSIVE METABOLIC PANEL
ALK PHOS: 94 U/L (ref 38–126)
ALT: 30 U/L (ref 17–63)
AST: 57 U/L — AB (ref 15–41)
Albumin: 2.1 g/dL — ABNORMAL LOW (ref 3.5–5.0)
Anion gap: 12 (ref 5–15)
BUN: 34 mg/dL — AB (ref 6–20)
CALCIUM: 8.5 mg/dL — AB (ref 8.9–10.3)
CO2: 24 mmol/L (ref 22–32)
CREATININE: 1.52 mg/dL — AB (ref 0.61–1.24)
Chloride: 105 mmol/L (ref 101–111)
GFR, EST AFRICAN AMERICAN: 45 mL/min — AB (ref >60)
GFR, EST NON AFRICAN AMERICAN: 39 mL/min — AB (ref >60)
Glucose, Bld: 120 mg/dL — ABNORMAL HIGH (ref 65–99)
Potassium: 2.9 mmol/L — ABNORMAL LOW (ref 3.5–5.1)
Sodium: 141 mmol/L (ref 135–145)
Total Bilirubin: 0.5 mg/dL (ref 0.3–1.2)
Total Protein: 5.5 g/dL — ABNORMAL LOW (ref 6.5–8.1)

## 2015-10-24 LAB — URINE CULTURE

## 2015-10-24 LAB — HIV ANTIBODY (ROUTINE TESTING W REFLEX): HIV Screen 4th Generation wRfx: NONREACTIVE

## 2015-10-24 MED ORDER — POTASSIUM CHLORIDE CRYS ER 20 MEQ PO TBCR
40.0000 meq | EXTENDED_RELEASE_TABLET | Freq: Two times a day (BID) | ORAL | Status: AC
  Administered 2015-10-24 (×2): 40 meq via ORAL
  Filled 2015-10-24 (×3): qty 2

## 2015-10-24 NOTE — Progress Notes (Signed)
[facility] PROGRESS NOTE  [name] [id number] DOB: 10/14/1924 DOA: 10/22/2015 PCP: No primary care provider on file.  HPI/Brief narrative 82 y.o. male with PMH significant for Systolic HF Ef 30 to 35 % by ECHO 2014, BPH, chronic foley catheter, A fib on Coumadin, CKD stage III cr range 1.2 to 1.7 who presents with coffee ground emesis  Assessment/Plan: 1-GI bleed, Coffee ground emesis;  Patient presented with supra-therapeutic INR.  Coumadin remains on hold and patient received one unit of FFP with Vitamin K, FFP On protonix  2-Acute on chronic renal failure, stage III. Prior cr range 1.2 to 1.7.  Suspect related to hypovolemia, hypotension, anemia Improving with IV fluids. Held diuretics.  Check UA.  Continue strict I and O.  Will need to exchange foley catheter when INR is lower.   3-Leukocytosis.  No fever, repeat labs,. Check Chest x ray and UA.   4-Left side abdominal pain: check KUB.   5-Systolic HF; appears compensated.  Diuretics on hold per above No LE edema on exam  6-A fib: holding coumadin in setting of bleeding. Continue with amiodarone. Rate controlled  Code Status: Full Family Communication: Pt in room Disposition Plan: Uncertain at this time   Consultants:  GI  Procedures:  EGD 3/4  Antibiotics: Anti-infectives    Start     Dose/Rate Route Frequency Ordered Stop   10/23/15 0815  cefTRIAXone (ROCEPHIN) 1 g in dextrose 5 % 50 mL IVPB     1 g 100 mL/hr over 30 Minutes Intravenous Every 24 hours 10/23/15 0801 10/30/15 0814   10/23/15 0815  azithromycin (ZITHROMAX) 500 mg in dextrose 5 % 250 mL IVPB     500 mg 250 mL/hr over 60 Minutes Intravenous Every 24 hours 10/23/15 0801 10/30/15 0814      HPI/Subjective: Patient reports feeling better  Objective: Filed Vitals:   10/24/15 1000 10/24/15 1100 10/24/15 1200 10/24/15 1231  BP: 110/57 98/45 92/47    Pulse: 73 68 65   Temp:    98.2 F (36.8 C)  TempSrc:    Oral   Resp: 20 30 23    Height:      Weight:      SpO2: 98% 96% 97%     Intake/Output Summary (Last 24 hours) at 10/24/15 1347 Last data filed at 10/24/15 1000  Gross per 24 hour  Intake   2450 ml  Output   1000 ml  Net   1450 ml   Filed Weights   10/22/15 1426 10/24/15 0356  Weight: 81.647 kg (180 lb) 80.6 kg (177 lb 11.1 oz)    Exam:   General:  Awake, in nad, laying in bed  Cardiovascular: regular, s1, s2  Respiratory: normal resp effort, no wheezing  Abdomen: soft,nondistended, pos BS  Musculoskeletal: perfused, no clubbing   Data Reviewed: Basic Metabolic Panel:  Recent Labs Lab 10/22/15 1424 10/23/15 0155 10/24/15 0100  NA 135 137 141  K 3.9 3.9 2.9*  CL 102 103 105  CO2 20* 21* 24  GLUCOSE 118* 101* 120*  BUN 45* 43* 34*  CREATININE 3.41* 2.68* 1.52*  CALCIUM 8.7* 8.7* 8.5*   Liver Function Tests:  Recent Labs Lab 10/22/15 1424 10/24/15 0100  AST 39 57*  ALT 29 30  ALKPHOS 117 94  BILITOT 0.6 0.5  PROT 6.9 5.5*  ALBUMIN 2.5* 2.1*   No results for input(s): LIPASE, AMYLASE in the last 168 hours. No results for input(s): AMMONIA in the last 168 hours. CBC:  Recent Labs Lab  10/22/15 2051 10/23/15 0155 10/23/15 0912 10/23/15 1808 10/24/15 0100  WBC 24.0* 23.0* 19.7* 15.2* 12.4*  HGB 9.2* 8.3* 8.4* 7.3* 7.5*  HCT 29.8* 25.3* 25.6* 23.0* 23.9*  MCV 89.8 88.8 89.2 88.5 88.2  PLT 239 221 251 171 173   Cardiac Enzymes: No results for input(s): CKTOTAL, CKMB, CKMBINDEX, TROPONINI in the last 168 hours. BNP (last 3 results) No results for input(s): BNP in the last 8760 hours.  ProBNP (last 3 results) No results for input(s): PROBNP in the last 8760 hours.  CBG:  Recent Labs Lab 10/23/15 1236  GLUCAP 105*    Recent Results (from the past 240 hour(s))  Urine culture     Status: None   Collection Time: 10/22/15  7:11 PM  Result Value Ref Range Status   Specimen Description URINE, RANDOM  Final   Special Requests NONE  Final    Culture MULTIPLE SPECIES PRESENT, SUGGEST RECOLLECTION  Final   Report Status 10/24/2015 FINAL  Final  MRSA PCR Screening     Status: Abnormal   Collection Time: 10/23/15 12:14 PM  Result Value Ref Range Status   MRSA by PCR POSITIVE (A) NEGATIVE Final    Comment:        The GeneXpert MRSA Assay (FDA approved for NASAL specimens only), is one component of a comprehensive MRSA colonization surveillance program. It is not intended to diagnose MRSA infection nor to guide or monitor treatment for MRSA infections. RESULT CALLED TO, READ BACK BY AND VERIFIED WITH: RN T HARVEY AT [phone] AB:[id number] MARTINB      Studies: Dg Chest Port 1 View  10/22/2015  CLINICAL DATA:  82 year old male with left-sided chest pain x1 day. Leukocytosis. EXAM: PORTABLE CHEST 1 VIEW COMPARISON:  Radiograph dated 12/06/2012 FINDINGS: Single portable view of the chest demonstrate mild increased interstitial and vascular prominence likely minimal congestive changes. An area of apparent increased density at the left lung base over the cardiac silhouette may represent mild atelectatic changes. Pneumonia is not excluded. Clinical correlation is recommended. There is no pleural effusion, or pneumothorax. The cardiac silhouette is within normal limits. No acute osseous pathology. IMPRESSION: Mild congestive changes. Faint area of increased density at the left lung base may represent atelectatic changes. Pneumonia is not excluded. Clinical correlation and follow-up recommended. Electronically Signed   By: Anner Crete M.D.   On: 10/22/2015 18:15   Dg Abd Portable 1v  10/22/2015  CLINICAL DATA:  Abdominal distention for 1 day EXAM: PORTABLE ABDOMEN - 1 VIEW COMPARISON:  None. FINDINGS: There is no bowel dilation to suggest obstruction or significant adynamic ileus. Soft tissues are unremarkable.  Bony structures are demineralized. IMPRESSION: 1. No acute finding. No evidence of bowel obstruction or significant adynamic ileus.  Electronically Signed   By: Lajean Manes M.D.   On: 10/22/2015 17:52    Scheduled Meds: . acidophilus  1 capsule Oral Daily  . amiodarone  100 mg Oral Daily  . azithromycin  500 mg Intravenous Q24H  . buPROPion  75 mg Oral BID WC  . cefTRIAXone (ROCEPHIN)  IV  1 g Intravenous Q24H  . Chlorhexidine Gluconate Cloth  6 each Topical Q0600  . l-methylfolate-B6-B12  1 tablet Oral BID  . mirabegron ER  25 mg Oral Daily  . mupirocin ointment  1 application Nasal BID  . [START ON 10/26/2015] pantoprazole (PROTONIX) IV  40 mg Intravenous Q12H  . pneumococcal 23 valent vaccine  0.5 mL Intramuscular Tomorrow-1000  . potassium chloride  40 mEq Oral BID  WC  . pravastatin  80 mg Oral q1800  . sodium chloride flush  3 mL Intravenous Q12H   Continuous Infusions: . sodium chloride 50 mL/hr at 10/23/15 0730  . pantoprozole (PROTONIX) infusion 8 mg/hr (10/24/15 0726)    Principal Problem:   Upper GI bleed Active Problems:   Essential hypertension   AKI (acute kidney injury) ([place])   Chronic systolic CHF (congestive heart failure) ([place])   Long term (current) use of anticoagulants   GI bleed   [facility], [facility] Pager [phone]. If 7PM-7AM, please contact night-coverage at [email or web address], password [facility] 10/24/2015, 1:47 PM  LOS: 2 days

## 2015-10-24 NOTE — Evaluation (Signed)
Clinical/Bedside Swallow Evaluation Patient Details  Name: [name] MRN: [id number] Date of Birth: 02-18-1925  Today's Date: 10/24/2015 Time: SLP Start Time (ACUTE ONLY): [id number] SLP Stop Time (ACUTE ONLY): 1008 SLP Time Calculation (min) (ACUTE ONLY): 17 min  Past Medical History:  Past Medical History  Diagnosis Date  . Anemia     nos  . History of colonic polyps   . Hyperlipidemia   . Hypertension   . Cancer ([place]) [id number]    hx of basal cell  . BPH (benign prostatic hyperplasia)   . Anxiety attack     panic   . Pre-syncope     echo 03/2010,with EF 60%,mild RV dilation,no significant abnormalities...event monitor for 3wks/episodes os sinus bradycardia with heart rate to low 40's occasionally at night(suspect while sleeping)  . Atrial fibrillation ([place])     a. Dx 11/2012 - TEE with possible LAA thrombus, started on amio/coumadin.  . Systolic CHF ([place])     a. EF 25-30% 11/2012 ?tachy-mediated. b. Not on ACEI at dc due to AKI, can consider as OP.  [name] CAD (coronary artery disease)     a. Mod CAD by cath 12/10/12 - 60% long proximal LAD, 60% prox PDA.  [name] AKI (acute kidney injury) ([place])     a. Peak Cr 4.17 11/2012 - felt to be post-obstructive.  . Urinary retention     a. Post obstructive, dc'd with foley 12/2012.  [name] Renal cyst     a. By renal US 11/2012, not candidate for further eval.  . Thrombus of left atrial appendage     a. Possible LAA thrombus by TEE 11/2012.   Past Surgical History:  Past Surgical History  Procedure Laterality Date  . Colonoscopy w/ polypectomy  2007  . Cholecystectomy    . Total knee arthroplasty    . Mohs surgery  2010  . Tee without cardioversion N/A 12/11/2012    Procedure: TRANSESOPHAGEAL ECHOCARDIOGRAM (TEE);  Surgeon: Thayer Headings, MD;  Location: [facility];  Service: Cardiovascular;  Laterality: N/A;  ja/bev.  Darden Dates without cardioversion N/A 01/21/2013    Procedure: TRANSESOPHAGEAL ECHOCARDIOGRAM (TEE);  Surgeon: Larey Dresser, MD;   Location: [facility];  Service: Cardiovascular;  Laterality: N/A;  . Cardioversion N/A 01/21/2013    Procedure: CARDIOVERSION;  Surgeon: Larey Dresser, MD;  Location: [facility];  Service: Cardiovascular;  Laterality: N/A;  . Left heart catheterization with coronary angiogram N/A 12/10/2012    Procedure: LEFT HEART CATHETERIZATION WITH CORONARY ANGIOGRAM;  Surgeon: Larey Dresser, MD;  Location: [facility] CATH LAB;  Service: Cardiovascular;  Laterality: N/A;   HPI:  EARLEE AGE is a 82 y.o. male who vomited up black fluid last night X 1 and has been having left-sided pain since middle of the week. Black vomit covered his chest and also went onto his blanket and on the floor and his daughter saw it after it had occurred. Denies epigastric pain. Reports chronic abdominal bloating. Denies melena or hematochezia. Poor PO intake per his daughter who is bedside. He denies ever vomiting blood up before and thinks he had an EGD in the past but is not sure. Reports colonoscopy over 10 years ago but records not available. INR 4.6 on chronic Coumadin. Hgb 9.5. Hemoccult negative. Denies any recent Abx (daughter reports last Abx was in 06/2015). Patient is oriented X 3.  Current chest x-ray showing questionable PNA.     Assessment / Plan / Recommendation Clinical Impression  Clinical swallowing evaluation was completed.  Oral mechanism exam was deferred as the patient was eating when ST entered the room.  The patient presented with oropharyngeal dysphagia characterized by delayed oral transit and probable delayed swallow trigger.   Very inconsistent throat clear was noted given thin liquids and dry solids.  The patient performed better with moistened solids.  Given the possibility of PNA as well as the patient complaints of intermittent trouble strangling unless he eats/drinks slowly and current clinical presentation recommend MBSS to rule out aspiration.  Recommend not advancing diet beyond dysphagia 3 and thin  liquids from his current clear liquid diet.    MBS will be completed next date.      Aspiration Risk  Mild aspiration risk    Diet Recommendation   Continue clears unless MD wants to advance.  Do NOT advance beyond dysphagia 3 with thin liquids pending results of MBS.    Medication Administration: Whole meds with puree    Other  Recommendations Oral Care Recommendations: Oral care BID   Follow up Recommendations   (TBD)           Prognosis Prognosis for Safe Diet Advancement: Fair      Swallow Study   General Date of Onset: 10/22/15 HPI: [name] is a 82 y.o. male who vomited up black fluid last night X 1 and has been having left-sided pain since middle of the week. Black vomit covered his chest and also went onto his blanket and on the floor and his daughter saw it after it had occurred. Denies epigastric pain. Reports chronic abdominal bloating. Denies melena or hematochezia. Poor PO intake per his daughter who is bedside. He denies ever vomiting blood up before and thinks he had an EGD in the past but is not sure. Reports colonoscopy over 10 years ago but records not available. INR 4.6 on chronic Coumadin. Hgb 9.5. Hemoccult negative. Denies any recent Abx (daughter reports last Abx was in 06/2015). Patient is oriented X 3.  Current chest x-ray showing questionable PNA.   Type of Study: Bedside Swallow Evaluation Previous Swallow Assessment: None noted.   Diet Prior to this Study: Other (Comment) (Clears) Temperature Spikes Noted: Yes Respiratory Status: Nasal cannula History of Recent Intubation: No Behavior/Cognition: Alert;Cooperative;Pleasant mood Oral Cavity Assessment: Dry Oral Care Completed by SLP: Recent completion by staff Oral Cavity - Dentition: Poor condition;Adequate natural dentition Vision: Functional for self-feeding Self-Feeding Abilities: Needs assist Patient Positioning: Upright in bed Baseline Vocal Quality: Low vocal intensity    Oral/Motor/Sensory  Function Overall Oral Motor/Sensory Function:  (deferred)   Ice Chips Ice chips: Not tested   Thin Liquid Thin Liquid: Impaired Presentation: Cup;Spoon;Straw Pharyngeal  Phase Impairments: Suspected delayed Swallow (Very inconsistent throat clear.)    Nectar Thick Nectar Thick Liquid: Not tested   Honey Thick Honey Thick Liquid: Not tested   Puree Puree: Impaired Presentation: Spoon Oral Phase Impairments: Impaired mastication Oral Phase Functional Implications: Prolonged oral transit Pharyngeal Phase Impairments: Suspected delayed Swallow   Solid   GO   Solid: Impaired Presentation: Spoon Oral Phase Impairments: Impaired mastication Oral Phase Functional Implications: Prolonged oral transit;Oral residue Pharyngeal Phase Impairments: Suspected delayed Swallow (Very iconsistent throat clear given dry solids.  )    Functional Assessment Tool Used: ASHA NOMS and clinical judgment.   Functional Limitations: Swallowing Swallow Current Status [phone]): At least 20 percent but less than 40 percent impaired, limited or restricted Swallow Goal Status [phone]): At least 20 percent but less than 40 percent impaired, limited or restricted  Shelly Flatten, MA, [facility] SLP [phone]  Shelly Flatten N 10/24/2015,10:18 AM

## 2015-10-24 NOTE — Progress Notes (Signed)
Patient ID: [name], male   DOB: Jul 08, 1925, 82 y.o.   MRN: [id number] [facility] Progress Note  [name] 82 y.o. 1925-08-15   Subjective: Resting comfortably. No complaints.  Objective: Vital signs: Filed Vitals:   10/24/15 1200 10/24/15 1231  BP: 92/47   Pulse: 65   Temp:  98.2 F (36.8 C)  Resp: 23     Physical Exam: Gen: lethargic, elderly, frail, no acute distress  HEENT: anicteric sclera CV: RRR Chest: CTA B Abd: diffusely tender with guarding, +BS   Lab Results:  Recent Labs  10/23/15 0155 10/24/15 0100  NA 137 141  K 3.9 2.9*  CL 103 105  CO2 21* 24  GLUCOSE 101* 120*  BUN 43* 34*  CREATININE 2.68* 1.52*  CALCIUM 8.7* 8.5*    Recent Labs  10/22/15 1424 10/24/15 0100  AST 39 57*  ALT 29 30  ALKPHOS 117 94  BILITOT 0.6 0.5  PROT 6.9 5.5*  ALBUMIN 2.5* 2.1*    Recent Labs  10/23/15 1808 10/24/15 0100  WBC 15.2* 12.4*  HGB 7.3* 7.5*  HCT 23.0* 23.9*  MCV 88.5 88.2  PLT 171 173      Assessment/Plan: 82 yo ss/p episode of coffee grounds emesis in the setting of an elevated INR (4.6) and s/p EGD yesterday that showed gastritis and erosive esophagitis. Suspect esophagitis as source of recent bleeding. Speech path evaluation underway so will defer advancing diet to their recommendation. F/U on path when available. Will sign off. Dr. Cristina Gong available to see this week if necessary. Call us back if questions.   William C. 10/24/2015, 1:20 PM  Pager [phone]  If no answer or after 5 PM call [phone]

## 2015-10-24 NOTE — Progress Notes (Signed)
Utilization Review Completed.Elveta Rape T3/12/2015  

## 2015-10-25 ENCOUNTER — Inpatient Hospital Stay (HOSPITAL_COMMUNITY): Payer: Medicare Other

## 2015-10-25 ENCOUNTER — Encounter (HOSPITAL_COMMUNITY): Payer: Self-pay | Admitting: Gastroenterology

## 2015-10-25 LAB — CBC
HEMATOCRIT: 23.8 % — AB (ref 39.0–52.0)
Hemoglobin: 7.4 g/dL — ABNORMAL LOW (ref 13.0–17.0)
MCH: 27.7 pg (ref 26.0–34.0)
MCHC: 31.1 g/dL (ref 30.0–36.0)
MCV: 89.1 fL (ref 78.0–100.0)
Platelets: 193 10*3/uL (ref 150–400)
RBC: 2.67 MIL/uL — ABNORMAL LOW (ref 4.22–5.81)
RDW: 16.5 % — AB (ref 11.5–15.5)
WBC: 10.3 10*3/uL (ref 4.0–10.5)

## 2015-10-25 LAB — BASIC METABOLIC PANEL
ANION GAP: 9 (ref 5–15)
BUN: 21 mg/dL — ABNORMAL HIGH (ref 6–20)
CO2: 22 mmol/L (ref 22–32)
Calcium: 8.1 mg/dL — ABNORMAL LOW (ref 8.9–10.3)
Chloride: 110 mmol/L (ref 101–111)
Creatinine, Ser: 1.07 mg/dL (ref 0.61–1.24)
GFR calc Af Amer: >60 mL/min (ref >60)
GFR, EST NON AFRICAN AMERICAN: 59 mL/min — AB (ref >60)
Glucose, Bld: 110 mg/dL — ABNORMAL HIGH (ref 65–99)
POTASSIUM: 3.6 mmol/L (ref 3.5–5.1)
Sodium: 141 mmol/L (ref 135–145)

## 2015-10-25 LAB — PROTIME-INR
INR: 2.02 — AB (ref 0.00–1.49)
Prothrombin Time: 22.7 seconds — ABNORMAL HIGH (ref 11.6–15.2)

## 2015-10-25 MED ORDER — WARFARIN - PHARMACIST DOSING INPATIENT
Freq: Every day | Status: DC
  Administered 2015-10-26 – 2015-10-27 (×2)

## 2015-10-25 MED ORDER — WHITE PETROLATUM GEL
Status: AC
Start: 2015-10-25 — End: 2015-10-25
  Administered 2015-10-25: 14:00:00
  Filled 2015-10-25: qty 1

## 2015-10-25 MED ORDER — ENSURE ENLIVE PO LIQD
237.0000 mL | Freq: Three times a day (TID) | ORAL | Status: DC
  Administered 2015-10-26 – 2015-10-28 (×6): 237 mL via ORAL

## 2015-10-25 MED ORDER — WARFARIN 0.5 MG HALF TABLET
0.5000 mg | ORAL_TABLET | Freq: Once | ORAL | Status: AC
  Administered 2015-10-25: 0.5 mg via ORAL
  Filled 2015-10-25: qty 1

## 2015-10-25 MED ORDER — WARFARIN SODIUM 2.5 MG PO TABS
2.5000 mg | ORAL_TABLET | Freq: Once | ORAL | Status: DC
  Filled 2015-10-25: qty 1

## 2015-10-25 NOTE — Progress Notes (Signed)
[facility] PROGRESS NOTE  [name] [id number] DOB: [birth year] DOA: 10/22/2015 PCP: No primary care provider on file.  HPI/Brief narrative 82 y.o. male with PMH significant for Systolic HF Ef 30 to 35 % by ECHO 2014, BPH, chronic foley catheter, A fib on Coumadin, CKD stage III cr range 1.2 to 1.7 who presents with coffee ground emesis  Assessment/Plan: 1-GI bleed, Coffee ground emesis;  Patient presented with supra-therapeutic INR.  Coumadin was placed on hold and patient received one unit of FFP with Vitamin K, FFP On protonix Pt seen by GI and underwent EGD on 3/4 with findings of gastritis and esophagitis Discussed with GI today. OK to resume anticoagulation  2-Acute on chronic renal failure, stage III. Prior cr range 1.2 to 1.7.  Suspect related to hypovolemia, hypotension, anemia Improving with IV fluids. Held diuretics on admit Check UA.  Continue strict I and O.  Will need to exchange foley catheter when INR is lower.   3-Leukocytosis.  No fever, repeat labs,. Check Chest x ray and UA.   4-Left side abdominal pain: KUB unremarkable  5-Systolic HF; appears compensated.  Diuretics were placed on hold per above No LE edema on exam On gentle IVF  6-A fib: holding coumadin in setting of bleeding. Continue with amiodarone. Rate controlled  Code Status: Full Family Communication: Pt in room Disposition Plan: Uncertain at this time   Consultants:  GI  Procedures:  EGD 3/4  Antibiotics: Anti-infectives    Start     Dose/Rate Route Frequency Ordered Stop   10/23/15 0815  cefTRIAXone (ROCEPHIN) 1 g in dextrose 5 % 50 mL IVPB     1 g 100 mL/hr over 30 Minutes Intravenous Every 24 hours 10/23/15 0801 10/30/15 0814   10/23/15 0815  azithromycin (ZITHROMAX) 500 mg in dextrose 5 % 250 mL IVPB     500 mg 250 mL/hr over 60 Minutes Intravenous Every 24 hours 10/23/15 0801 10/30/15 0814      HPI/Subjective: Patient is without complaints this  AM  Objective: Filed Vitals:   10/25/15 1000 10/25/15 1200 10/25/15 1223 10/25/15 1300  BP: 96/53 92/41  97/43  Pulse: 62 62  62  Temp:   98 F (36.7 C)   TempSrc:   Oral   Resp: 25 23  21   Height:      Weight:      SpO2: 95% 97%  96%    Intake/Output Summary (Last 24 hours) at 10/25/15 1438 Last data filed at 10/25/15 1300  Gross per 24 hour  Intake   2800 ml  Output   1575 ml  Net   1225 ml   Filed Weights   10/22/15 1426 10/24/15 0356 10/25/15 0500  Weight: 81.647 kg (180 lb) 80.6 kg (177 lb 11.1 oz) 81.7 kg (180 lb 1.9 oz)    Exam:   General:  Awake, laying in bed, in nad  Cardiovascular: regular, s1, s2  Respiratory: normal resp effort, no wheezing  Abdomen: soft,nondistended, pos BS  Musculoskeletal: perfused, no clubbing, no cyanosis  Data Reviewed: Basic Metabolic Panel:  Recent Labs Lab 10/22/15 1424 10/23/15 0155 10/24/15 0100 10/25/15 0229  NA 135 137 141 141  K 3.9 3.9 2.9* 3.6  CL 102 103 105 110  CO2 20* 21* 24 22  GLUCOSE 118* 101* 120* 110*  BUN 45* 43* 34* 21*  CREATININE 3.41* 2.68* 1.52* 1.07  CALCIUM 8.7* 8.7* 8.5* 8.1*   Liver Function Tests:  Recent Labs Lab 10/22/15 1424 10/24/15 0100  AST  39 57*  ALT 29 30  ALKPHOS 117 94  BILITOT 0.6 0.5  PROT 6.9 5.5*  ALBUMIN 2.5* 2.1*   No results for input(s): LIPASE, AMYLASE in the last 168 hours. No results for input(s): AMMONIA in the last 168 hours. CBC:  Recent Labs Lab 10/23/15 0155 10/23/15 0912 10/23/15 1808 10/24/15 0100 10/25/15 0229  WBC 23.0* 19.7* 15.2* 12.4* 10.3  HGB 8.3* 8.4* 7.3* 7.5* 7.4*  HCT 25.3* 25.6* 23.0* 23.9* 23.8*  MCV 88.8 89.2 88.5 88.2 89.1  PLT 221 251 171 173 193   Cardiac Enzymes: No results for input(s): CKTOTAL, CKMB, CKMBINDEX, TROPONINI in the last 168 hours. BNP (last 3 results) No results for input(s): BNP in the last 8760 hours.  ProBNP (last 3 results) No results for input(s): PROBNP in the last 8760  hours.  CBG:  Recent Labs Lab 10/23/15 1236  GLUCAP 105*    Recent Results (from the past 240 hour(s))  Urine culture     Status: None   Collection Time: 10/22/15  7:11 PM  Result Value Ref Range Status   Specimen Description URINE, RANDOM  Final   Special Requests NONE  Final   Culture MULTIPLE SPECIES PRESENT, SUGGEST RECOLLECTION  Final   Report Status 10/24/2015 FINAL  Final  Culture, blood (routine x 2) Call MD if unable to obtain prior to antibiotics being given     Status: None (Preliminary result)   Collection Time: 10/23/15  8:23 AM  Result Value Ref Range Status   Specimen Description BLOOD LEFT ANTECUBITAL  Final   Special Requests BOTTLES DRAWN AEROBIC AND ANAEROBIC 5MLS  Final   Culture NO GROWTH 2 DAYS  Final   Report Status PENDING  Incomplete  Culture, blood (routine x 2) Call MD if unable to obtain prior to antibiotics being given     Status: None (Preliminary result)   Collection Time: 10/23/15  9:12 AM  Result Value Ref Range Status   Specimen Description RIGHT ANTECUBITAL  Final   Special Requests BOTTLES DRAWN AEROBIC ONLY 5CC  Final   Culture NO GROWTH 2 DAYS  Final   Report Status PENDING  Incomplete  MRSA PCR Screening     Status: Abnormal   Collection Time: 10/23/15 12:14 PM  Result Value Ref Range Status   MRSA by PCR POSITIVE (A) NEGATIVE Final    Comment:        The GeneXpert MRSA Assay (FDA approved for NASAL specimens only), is one component of a comprehensive MRSA colonization surveillance program. It is not intended to diagnose MRSA infection nor to guide or monitor treatment for MRSA infections. RESULT CALLED TO, READ BACK BY AND VERIFIED WITH: RN T HARVEY AT [phone] [id number] MARTINB      Studies: Dg Swallowing Func-speech Pathology  10/25/2015  Objective Swallowing Evaluation: Type of Study: MBS-Modified Barium Swallow Study Patient Details Name: [name] MRN: [id number] Date of Birth: 1925-06-19 Today's Date: 10/25/2015 Time: SLP  Start Time (ACUTE ONLY): 1030-SLP Stop Time (ACUTE ONLY): 1100 SLP Time Calculation (min) (ACUTE ONLY): 30 min Past Medical History: Past Medical History Diagnosis Date . Anemia    nos . History of colonic polyps  . Hyperlipidemia  . Hypertension  . Cancer ([place]) [id number]   hx of basal cell . BPH (benign prostatic hyperplasia)  . Anxiety attack    panic  . Pre-syncope    echo 03/2010,with EF 60%,mild RV dilation,no significant abnormalities...event monitor for 3wks/episodes os sinus bradycardia with heart rate to low 40's occasionally  at night(suspect while sleeping) . Atrial fibrillation ([place])    a. Dx 11/2012 - TEE with possible LAA thrombus, started on amio/coumadin. . Systolic CHF ([place])    a. EF 25-30% 11/2012 ?tachy-mediated. b. Not on ACEI at dc due to AKI, can consider as OP. [name] CAD (coronary artery disease)    a. Mod CAD by cath 12/10/12 - 60% long proximal LAD, 60% prox PDA. [name] AKI (acute kidney injury) ([place])    a. Peak Cr 4.17 11/2012 - felt to be post-obstructive. . Urinary retention    a. Post obstructive, dc'd with foley 12/2012. [name] Renal cyst    a. By renal US 11/2012, not candidate for further eval. . Thrombus of left atrial appendage    a. Possible LAA thrombus by TEE 11/2012. Past Surgical History: Past Surgical History Procedure Laterality Date . Colonoscopy w/ polypectomy  2007 . Cholecystectomy   . Total knee arthroplasty   . Mohs surgery  2010 . Tee without cardioversion N/A 12/11/2012   Procedure: TRANSESOPHAGEAL ECHOCARDIOGRAM (TEE);  Surgeon: Thayer Headings, MD;  Location: [facility];  Service: Cardiovascular;  Laterality: N/A;  ja/bev. Darden Dates without cardioversion N/A 01/21/2013   Procedure: TRANSESOPHAGEAL ECHOCARDIOGRAM (TEE);  Surgeon: Larey Dresser, MD;  Location: [facility];  Service: Cardiovascular;  Laterality: N/A; . Cardioversion N/A 01/21/2013   Procedure: CARDIOVERSION;  Surgeon: Larey Dresser, MD;  Location: [facility];  Service: Cardiovascular;  Laterality: N/A; . Left heart  catheterization with coronary angiogram N/A 12/10/2012   Procedure: LEFT HEART CATHETERIZATION WITH CORONARY ANGIOGRAM;  Surgeon: Larey Dresser, MD;  Location: [facility] CATH LAB;  Service: Cardiovascular;  Laterality: N/A; HPI: DEWARREN ALAMOS is a 82 y.o. male who vomited up black fluid last night X 1 and has been having left-sided pain since middle of the week. Black vomit covered his chest and also went onto his blanket and on the floor and his daughter saw it after it had occurred. Denies epigastric pain. Reports chronic abdominal bloating. Denies melena or hematochezia. Poor PO intake per his daughter who is bedside. He denies ever vomiting blood up before and thinks he had an EGD in the past but is not sure. Reports colonoscopy over 10 years ago but records not available. INR 4.6 on chronic Coumadin. Hgb 9.5. Hemoccult negative. Denies any recent Abx (daughter reports last Abx was in 06/2015). Patient is oriented X 3.  Current chest x-ray showing questionable PNA.   Subjective: The patient was seen sitting upright in bed just having eaten breakfast and taken his medications whole in puree.  Assessment / Plan / Recommendation CHL IP CLINICAL IMPRESSIONS 10/25/2015 Therapy Diagnosis Mild pharyngeal phase dysphagia;Suspected primary esophageal dysphagia Clinical Impression Pt demonstrates mild deficits leading to trace residuals post swallow, though no gross weakness. There is mild premature spill over the base of tongue, but otherwise there was no delay in swallow response. Pt did silently aspirate puree once during esophageal sweep, so event was not captured. Suspect pt was talking while puree was at base of tongue, leading to aspiration before the swallow. subsequent trials were normal. Of concern was that barium tablet would not pass at the GE junction and corkscrew-like spasms were seen at the distal esophagus (no radiologist present to confirm). GI could review these images if needed. Recommend a dys 3 diet with  thin liquids with moderate risk of aspiration given positioning, suspected esophageal dysphagia and likely poor compliance with suggestions to avoid talking while eating. SLP will f/u to reinforce.  Impact  on safety and function Moderate aspiration risk   CHL IP TREATMENT RECOMMENDATION 10/25/2015 Treatment Recommendations Therapy as outlined in treatment plan below   Prognosis 10/25/2015 Prognosis for Safe Diet Advancement Fair Barriers to Reach Goals -- Barriers/Prognosis Comment -- CHL IP DIET RECOMMENDATION 10/25/2015 SLP Diet Recommendations Dysphagia 3 (Mech soft) solids;Thin liquid Liquid Administration via Cup;Straw Medication Administration Whole meds with puree Compensations Minimize environmental distractions;Slow rate;Small sips/bites Postural Changes Seated upright at 90 degrees;Remain semi-upright after after feeds/meals (Comment)   CHL IP OTHER RECOMMENDATIONS 10/25/2015 Recommended Consults -- Oral Care Recommendations Oral care BID Other Recommendations --   CHL IP FOLLOW UP RECOMMENDATIONS 10/25/2015 Follow up Recommendations Skilled Nursing facility   [facility] IP FREQUENCY AND DURATION 10/25/2015 Speech Therapy Frequency (ACUTE ONLY) min 2x/week Treatment Duration 2 weeks      CHL IP ORAL PHASE 10/25/2015 Oral Phase Impaired Oral - Pudding Teaspoon -- Oral - Pudding Cup -- Oral - Honey Teaspoon -- Oral - Honey Cup -- Oral - Nectar Teaspoon -- Oral - Nectar Cup -- Oral - Nectar Straw -- Oral - Thin Teaspoon -- Oral - Thin Cup -- Oral - Thin Straw -- Oral - Puree -- Oral - Mech Soft -- Oral - Regular -- Oral - Multi-Consistency -- Oral - Pill -- Oral Phase - Comment mild premature spill to valleculae  CHL IP PHARYNGEAL PHASE 10/25/2015 Pharyngeal Phase Impaired Pharyngeal- Pudding Teaspoon -- Pharyngeal -- Pharyngeal- Pudding Cup -- Pharyngeal -- Pharyngeal- Honey Teaspoon -- Pharyngeal -- Pharyngeal- Honey Cup -- Pharyngeal -- Pharyngeal- Nectar Teaspoon -- Pharyngeal -- Pharyngeal- Nectar Cup -- Pharyngeal --  Pharyngeal- Nectar Straw -- Pharyngeal -- Pharyngeal- Thin Teaspoon -- Pharyngeal -- Pharyngeal- Thin Cup Pharyngeal residue - valleculae;Pharyngeal residue - pyriform Pharyngeal -- Pharyngeal- Thin Straw Pharyngeal residue - valleculae;Pharyngeal residue - pyriform Pharyngeal -- Pharyngeal- Puree Pharyngeal residue - valleculae;Penetration/Apiration after swallow Pharyngeal -- Pharyngeal- Mechanical Soft Pharyngeal residue - valleculae;Pharyngeal residue - pyriform Pharyngeal -- Pharyngeal- Regular -- Pharyngeal -- Pharyngeal- Multi-consistency -- Pharyngeal -- Pharyngeal- Pill -- Pharyngeal -- Pharyngeal Comment --  No flowsheet data found. CHL IP GO 10/24/2015 Functional Assessment Tool Used ASHA NOMS and clinical judgment.   Functional Limitations Swallowing Swallow Current Status [phone]) CJ Swallow Goal Status [id number]) CJ Swallow Discharge Status [phone]) (None) Motor Speech Current Status [phone]) (None) Motor Speech Goal Status [phone]) (None) Motor Speech Goal Status [phone]) (None) Spoken Language Comprehension Current Status [phone]) (None) Spoken Language Comprehension Goal Status [id number]) (None) Spoken Language Comprehension Discharge Status [phone]) (None) Spoken Language Expression Current Status [phone]) (None) Spoken Language Expression Goal Status [phone]) (None) Spoken Language Expression Discharge Status [phone]) (None) Attention Current Status [id number]) (None) Attention Goal Status [id number]) (None) Attention Discharge Status [phone]) (None) Memory Current Status AE:130515) (None) Memory Goal Status [id number]) (None) Memory Discharge Status [id number]) (None) Voice Current Status [id number]) (None) Voice Goal Status [id number]) (None) Voice Discharge Status [id number]) (None) Other Speech-Language Pathology Functional Limitation [phone]) (None) Other Speech-Language Pathology Functional Limitation Goal Status [id number]) (None) Other Speech-Language Pathology Functional Limitation Discharge Status [phone]) (None) Herbie Baltimore, MA CCC-SLP  [phone] [place], Katherene Ponto 10/25/2015, 11:31 AM               Scheduled Meds: . acidophilus  1 capsule Oral Daily  . amiodarone  100 mg Oral Daily  . azithromycin  500 mg Intravenous Q24H  . buPROPion  75 mg Oral BID WC  . cefTRIAXone (ROCEPHIN)  IV  1 g Intravenous Q24H  . Chlorhexidine Gluconate Cloth  6 each Topical Q0600  .  feeding supplement (ENSURE ENLIVE)  237 mL Oral TID BM  . l-methylfolate-B6-B12  1 tablet Oral BID  . mirabegron ER  25 mg Oral Daily  . mupirocin ointment  1 application Nasal BID  . [START ON 10/26/2015] pantoprazole (PROTONIX) IV  40 mg Intravenous Q12H  . pneumococcal 23 valent vaccine  0.5 mL Intramuscular Tomorrow-1000  . pravastatin  80 mg Oral q1800  . sodium chloride flush  3 mL Intravenous Q12H   Continuous Infusions: . sodium chloride 50 mL/hr at 10/25/15 1300    Principal Problem:   Upper GI bleed Active Problems:   Essential hypertension   AKI (acute kidney injury) ([facility])   Chronic systolic CHF (congestive heart failure) ([place])   Long term (current) use of anticoagulants   GI bleed   CH[facility], [facility] Pager [phone]. If 7PM-7AM, please contact night-coverage at [email or web address], password [facility] 10/25/2015, 2:38 PM  LOS: 3 days

## 2015-10-25 NOTE — Progress Notes (Addendum)
ANTICOAGULATION CONSULT NOTE - Initial Consult  Pharmacy Consult for Coumadin Indication: atrial fibrillation and LAA thrombus  Allergies  Allergen Reactions  . Darifenacin Hydrobromide     REACTION: constipation  . Fluoxetine Hcl     REACTION: Very Depressed while taking   Patient Measurements: Height: 6' (182.9 cm) Weight: 180 lb 1.9 oz (81.7 kg) IBW/kg (Calculated) : 77.6  Vital Signs: Temp: 98 F (36.7 C) (03/06 1223) Temp Source: Oral (03/06 1223) BP: 97/43 mmHg (03/06 1300) Pulse Rate: 62 (03/06 1300)  Labs:  Recent Labs  10/22/15 1424  10/23/15 0155 10/23/15 0508  10/23/15 1808 10/24/15 0100 10/25/15 0229  HGB 9.5*  < > 8.3*  --   < > 7.3* 7.5* 7.4*  HCT 30.7*  < > 25.3*  --   < > 23.0* 23.9* 23.8*  PLT 273  < > 221  --   < > 171 173 193  LABPROT 42.4*  --   --  20.4*  --   --   --   --   INR 4.63*  --   --  1.75*  --   --   --   --   CREATININE 3.41*  --  2.68*  --   --   --  1.52* 1.07  < > = values in this interval not displayed.  Estimated Creatinine Clearance: 51.4 mL/min (by C-G formula based on Cr of 1.07).   Medical History: Past Medical History  Diagnosis Date  . Anemia     nos  . History of colonic polyps   . Hyperlipidemia   . Hypertension   . Cancer ([place]) [id number]    hx of basal cell  . BPH (benign prostatic hyperplasia)   . Anxiety attack     panic   . Pre-syncope     echo 03/2010,with EF 60%,mild RV dilation,no significant abnormalities...event monitor for 3wks/episodes os sinus bradycardia with heart rate to low 40's occasionally at night(suspect while sleeping)  . Atrial fibrillation ([place])     a. Dx 11/2012 - TEE with possible LAA thrombus, started on amio/coumadin.  . Systolic CHF ([place])     a. EF 25-30% 11/2012 ?tachy-mediated. b. Not on ACEI at dc due to AKI, can consider as OP.  [name] CAD (coronary artery disease)     a. Mod CAD by cath 12/10/12 - 60% long proximal LAD, 60% prox PDA.  [name] AKI (acute kidney injury) ([place])     a. Peak  Cr 4.17 11/2012 - felt to be post-obstructive.  . Urinary retention     a. Post obstructive, dc'd with foley 12/2012.  [name] Renal cyst     a. By renal US 11/2012, not candidate for further eval.  . Thrombus of left atrial appendage     a. Possible LAA thrombus by TEE 11/2012.    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 Coumadin was held on admission due to coffee ground emesis. GI was consulted and EGD performed- no active bleeding but mild distal esophagitis seen thought to be source of blood. Pharmacy has been consulted to resume Coumadin.  Last INR on 3/4 was 1.75 (down from 4.63 after Vitamin K 5mg ). No further INR's have been checked. AST mildly elevated on 3/5.  Interacting medications noted - Azithromycin and Amiodarone.   Prior home Coumadin dose: 1.25mg  daily except 2.5mg  on MWF.    Goal of Therapy:  INR 2-3 Monitor platelets by anticoagulation protocol:  Yes   Plan:  Recheck INR today.  Resume Coumadin 2.5mg  po x1 tonight Daily PT/INR while on therapy.   Sloan Leiter, PharmD, BCPS Clinical Pharmacist [phone]  10/25/2015,1:29 PM  Addendum:  Repeat INR came back at 2.02 despite no Coumadin since 3/2 and vitamin K 5mg . May be due to interacting medications.   Plan: Reduce Coumadin dose to 0.5mg  po x1 tonight Follow-up INR in AM.  Consider repeat LFTs   Sloan Leiter, PharmD, BCPS Clinical Pharmacist [phone] 10/25/2015, 3:29 PM

## 2015-10-25 NOTE — Progress Notes (Signed)
MBSS complete. Full report located under chart review in imaging section. Anadelia Kintz, MA CCC-SLP [phone]  

## 2015-10-25 NOTE — Op Note (Addendum)
[facility] [place], [id number]   ENDOSCOPY PROCEDURE REPORT  PATIENT: William, Randolph  MR#: [id number] BIRTHDATE: 07/10/1925 , 62  yrs. old GENDER: male ENDOSCOPIST: Wilford Corner, MD REFERRED BY: PROCEDURE DATE:  11/02/2015 PROCEDURE:  EGD w/ biopsy ASA CLASS:     Class III INDICATIONS:  GI bleed. MEDICATIONS: Versed 1 mg IV and Fentanyl 12.5 mcg IV TOPICAL ANESTHETIC: Cetacaine Spray  DESCRIPTION OF PROCEDURE: After the risks benefits and alternatives of the procedure were thoroughly explained, informed consent was obtained.  The Pentax Gastroscope [id number] endoscope was introduced through the mouth and advanced to the second portion of the duodenum , Without limitations.  The instrument was slowly withdrawn as the mucosa was fully examined. Estimated blood loss is zero unless otherwise noted in this procedure report.    Mild edema, erythema, and ulceration in the distal esophagus c/w erosive esophagitis. Esophagus o/w unrevealing. GEJ 44 cm from the incisors. Proximal and body of stomach normal. Distal stomach edematous in antrum and prepyloric channel - s/p biopsies. Duodenal bulb and 2nd portion of the duodenum normal in appearance. Retroflexed views revealed small hiatal hernia.     The scope was then withdrawn from the patient and the procedure completed.  COMPLICATIONS: There were no immediate complications.  ENDOSCOPIC IMPRESSION:     Mild distal erosive esophagitis Edematous distal stomach - s/p biopsies Small hiatal hernia Suspect bleeding due to esophagitis with elevated INR  RECOMMENDATIONS:     F/U on path; Clear liquid diet; Supportive care   eSigned:  Wilford Corner, MD 2015/11/02 3:29 PM    CC:  CPT CODES: ICD CODES:  The ICD and CPT codes recommended by this software are interpretations from the data that the clinical staff has captured with the software.  The verification of the translation of  this report to the ICD and CPT codes and modifiers is the sole responsibility of the health care institution and practicing physician where this report was generated.  [employer] will not be held responsible for the validity of the ICD and CPT codes included on this report.  AMA assumes no liability for data contained or not contained herein. CPT is a Designer, television/film set of the [employer].  PATIENT NAME:  [name], [name] MR#: [id number]

## 2015-10-25 NOTE — Progress Notes (Signed)
Initial Nutrition Assessment  DOCUMENTATION CODES:   Severe malnutrition in context of chronic illness  INTERVENTION:    Ensure Enlive PO TID, each supplement provides 350 kcal and 20 grams of protein  NUTRITION DIAGNOSIS:   Malnutrition related to chronic illness as evidenced by severe depletion of body fat, severe depletion of muscle mass.  GOAL:   Patient will meet greater than or equal to 90% of their needs  MONITOR:   PO intake, Supplement acceptance, Labs, Weight trends, Skin  REASON FOR ASSESSMENT:   Malnutrition Screening Tool, Low [place]    ASSESSMENT:   82 y.o. male with PMH significant for Systolic HF Ef 30 to 35 % by ECHO 2014, BPH, chronic foley catheter, A fib on Coumadin, CKD stage III cr range 1.2 to 1.7 who presents with coffee ground emesis. S/P EGD 3/5 that showed gastritis and erosive esophagitis. GI suspects esophagitis as source of recent bleeding.   S/P swallow evaluation with SLP this AM. Diet advanced to dysphagia 3 with thin liquids. Patient very hard of hearing. He said he likes Ensure. Daughter walked in as RD was leaving. She reports that patient has been eating poorly recently. Nutrition-Focused physical exam completed. Findings are severe fat depletion, severe muscle depletion, and no edema. Patient with severe PCM.   Diet Order:  DIET DYS 3 Room service appropriate?: Yes; Fluid consistency:: Thin  Skin:  Wound (see comment) (stage 1 pressure ulcer to sacrum)  Last BM:  3/5  Height:   Ht Readings from Last 1 Encounters:  10/22/15 6' (1.829 m)    Weight:   Wt Readings from Last 1 Encounters:  10/25/15 180 lb 1.9 oz (81.7 kg)    Ideal Body Weight:  80.9 kg  BMI:  Body mass index is 24.42 kg/(m^2).  Estimated Nutritional Needs:   Kcal:  1900-2100  Protein:  100-125 gm  Fluid:  2 L  EDUCATION NEEDS:   No education needs identified at this time  Molli Barrows, [place], [place], [place] Pager [phone] After Hours Pager [phone]

## 2015-10-26 ENCOUNTER — Inpatient Hospital Stay (HOSPITAL_COMMUNITY): Payer: Medicare Other

## 2015-10-26 LAB — CBC
HEMATOCRIT: 21.4 % — AB (ref 39.0–52.0)
HEMATOCRIT: 26.2 % — AB (ref 39.0–52.0)
HEMOGLOBIN: 8.6 g/dL — AB (ref 13.0–17.0)
Hemoglobin: 6.9 g/dL — CL (ref 13.0–17.0)
MCH: 28.6 pg (ref 26.0–34.0)
MCH: 29.1 pg (ref 26.0–34.0)
MCHC: 32.2 g/dL (ref 30.0–36.0)
MCHC: 32.8 g/dL (ref 30.0–36.0)
MCV: 88.8 fL (ref 78.0–100.0)
MCV: 88.5 fL (ref 78.0–100.0)
Platelets: 194 10*3/uL (ref 150–400)
Platelets: 204 10*3/uL (ref 150–400)
RBC: 2.41 MIL/uL — ABNORMAL LOW (ref 4.22–5.81)
RBC: 2.96 MIL/uL — AB (ref 4.22–5.81)
RDW: 16.3 % — ABNORMAL HIGH (ref 11.5–15.5)
RDW: 16.4 % — ABNORMAL HIGH (ref 11.5–15.5)
WBC: 9.7 10*3/uL (ref 4.0–10.5)
WBC: 11.1 10*3/uL — ABNORMAL HIGH (ref 4.0–10.5)

## 2015-10-26 LAB — BASIC METABOLIC PANEL
ANION GAP: 11 (ref 5–15)
BUN: 14 mg/dL (ref 6–20)
CHLORIDE: 109 mmol/L (ref 101–111)
CO2: 22 mmol/L (ref 22–32)
Calcium: 8.2 mg/dL — ABNORMAL LOW (ref 8.9–10.3)
Creatinine, Ser: 1 mg/dL (ref 0.61–1.24)
GFR calc non Af Amer: >60 mL/min (ref >60)
Glucose, Bld: 104 mg/dL — ABNORMAL HIGH (ref 65–99)
POTASSIUM: 3.3 mmol/L — AB (ref 3.5–5.1)
Sodium: 142 mmol/L (ref 135–145)

## 2015-10-26 LAB — PROTIME-INR
INR: 2.16 — ABNORMAL HIGH (ref 0.00–1.49)
PROTHROMBIN TIME: 23.9 s — AB (ref 11.6–15.2)

## 2015-10-26 LAB — PREPARE RBC (CROSSMATCH)

## 2015-10-26 MED ORDER — POTASSIUM CHLORIDE 20 MEQ/15ML (10%) PO SOLN
20.0000 meq | ORAL | Status: AC
  Administered 2015-10-26 (×2): 20 meq
  Filled 2015-10-26 (×2): qty 15

## 2015-10-26 MED ORDER — SODIUM CHLORIDE 0.9 % IV SOLN
Freq: Once | INTRAVENOUS | Status: AC
  Administered 2015-10-26: 10 mL/h via INTRAVENOUS

## 2015-10-26 MED ORDER — WARFARIN 0.5 MG HALF TABLET
0.5000 mg | ORAL_TABLET | Freq: Once | ORAL | Status: AC
  Administered 2015-10-26: 0.5 mg via ORAL
  Filled 2015-10-26: qty 1

## 2015-10-26 MED ORDER — FUROSEMIDE 40 MG PO TABS
40.0000 mg | ORAL_TABLET | Freq: Every day | ORAL | Status: DC
  Administered 2015-10-26 – 2015-10-28 (×3): 40 mg via ORAL
  Filled 2015-10-26 (×3): qty 1

## 2015-10-26 NOTE — Progress Notes (Signed)
Waiting for return call from family for blood consent. No blood consent present in chart.

## 2015-10-26 NOTE — Progress Notes (Signed)
[facility] PROGRESS NOTE  CHRISTOFHER EASTMAN [id number] DOB: 12/17/1924 DOA: 10/22/2015 PCP: No primary care provider on file.  HPI/Brief narrative 82 y.o. male with PMH significant for Systolic HF Ef 30 to 35 % by ECHO 2014, BPH, chronic foley catheter, A fib on Coumadin, CKD stage III cr range 1.2 to 1.7 who presents with coffee ground emesis  Assessment/Plan: 1-GI bleed, Coffee ground emesis;  Patient presented with supra-therapeutic INR.  Coumadin was placed on hold and patient received one unit of FFP with Vitamin K, FFP On protonix Pt seen by GI and underwent EGD on 3/4 with findings of gastritis and esophagitis OK to resume anticoagulation per GI Overnight hgb noted to be 6.9, given 1 unit PRBC. Continue to monitor CBC - if hgb continues to trend down, consider re-consult with GI  2-Acute on chronic renal failure, stage III. Prior cr range 1.2 to 1.7.  Suspect related to hypovolemia, hypotension, anemia Held diuretics on admit Check UA.  Continue strict I and O.    3-Leukocytosis.  No fever, repeat labs,. Check Chest x ray and UA.   4-Left side abdominal pain: KUB unremarkable  5-Systolic HF; appears compensated.  Diuretics were placed on hold per above on admit No LE edema on exam Pt is +4L. Will resume laisix.  6-A fib: Continue with amiodarone. Rate controlled. Resumed coumadin per GI  Code Status: Full Family Communication: Pt in room Disposition Plan: Uncertain at this time. PT/OT to eval for discharge planning. Suspect patient will need SNF   Consultants:  GI  Procedures:  EGD 3/4  Antibiotics: Anti-infectives    Start     Dose/Rate Route Frequency Ordered Stop   10/23/15 0815  cefTRIAXone (ROCEPHIN) 1 g in dextrose 5 % 50 mL IVPB     1 g 100 mL/hr over 30 Minutes Intravenous Every 24 hours 10/23/15 0801 10/30/15 0814   10/23/15 0815  azithromycin (ZITHROMAX) 500 mg in dextrose 5 % 250 mL IVPB     500 mg 250 mL/hr over 60 Minutes  Intravenous Every 24 hours 10/23/15 0801 10/30/15 0814      HPI/Subjective: Patient without complaints. Denies abd or chest pain  Objective: Filed Vitals:   10/26/15 1215 10/26/15 1230 10/26/15 1300 10/26/15 1400  BP: 121/55 116/51 119/53 122/55  Pulse: 66 64 66 62  Temp: 98.3 F (36.8 C) 98.2 F (36.8 C)    TempSrc: Oral Oral    Resp: 19 30 21 20   Height:      Weight:      SpO2: 93% 97% 98% 97%    Intake/Output Summary (Last 24 hours) at 10/26/15 1434 Last data filed at 10/26/15 1230  Gross per 24 hour  Intake   2377 ml  Output   1050 ml  Net   1327 ml   Filed Weights   10/24/15 0356 10/25/15 0500 10/26/15 0500  Weight: 80.6 kg (177 lb 11.1 oz) 81.7 kg (180 lb 1.9 oz) 84.4 kg (186 lb 1.1 oz)    Exam:   General:  Awake, laying in bed, in nad  Cardiovascular: regular, s1, s2  Respiratory: normal resp effort, no wheezing  Abdomen: soft,nondistended, pos BS  Musculoskeletal: perfused, no cyanosis  Data Reviewed: Basic Metabolic Panel:  Recent Labs Lab 10/22/15 1424 10/23/15 0155 10/24/15 0100 10/25/15 0229 10/26/15 0253  NA 135 137 141 141 142  K 3.9 3.9 2.9* 3.6 3.3*  CL 102 103 105 110 109  CO2 20* 21* 24 22 22   GLUCOSE 118* 101* 120*  110* 104*  BUN 45* 43* 34* 21* 14  CREATININE 3.41* 2.68* 1.52* 1.07 1.00  CALCIUM 8.7* 8.7* 8.5* 8.1* 8.2*   Liver Function Tests:  Recent Labs Lab 10/22/15 1424 10/24/15 0100  AST 39 57*  ALT 29 30  ALKPHOS 117 94  BILITOT 0.6 0.5  PROT 6.9 5.5*  ALBUMIN 2.5* 2.1*   No results for input(s): LIPASE, AMYLASE in the last 168 hours. No results for input(s): AMMONIA in the last 168 hours. CBC:  Recent Labs Lab 10/23/15 0912 10/23/15 1808 10/24/15 0100 10/25/15 0229 10/26/15 0253  WBC 19.7* 15.2* 12.4* 10.3 9.7  HGB 8.4* 7.3* 7.5* 7.4* 6.9*  HCT 25.6* 23.0* 23.9* 23.8* 21.4*  MCV 89.2 88.5 88.2 89.1 88.8  PLT 251 171 173 193 194   Cardiac Enzymes: No results for input(s): CKTOTAL, CKMB,  CKMBINDEX, TROPONINI in the last 168 hours. BNP (last 3 results) No results for input(s): BNP in the last 8760 hours.  ProBNP (last 3 results) No results for input(s): PROBNP in the last 8760 hours.  CBG:  Recent Labs Lab 10/23/15 1236  GLUCAP 105*    Recent Results (from the past 240 hour(s))  Urine culture     Status: None   Collection Time: 10/22/15  7:11 PM  Result Value Ref Range Status   Specimen Description URINE, RANDOM  Final   Special Requests NONE  Final   Culture MULTIPLE SPECIES PRESENT, SUGGEST RECOLLECTION  Final   Report Status 10/24/2015 FINAL  Final  Culture, blood (routine x 2) Call MD if unable to obtain prior to antibiotics being given     Status: None (Preliminary result)   Collection Time: 10/23/15  8:23 AM  Result Value Ref Range Status   Specimen Description BLOOD LEFT ANTECUBITAL  Final   Special Requests BOTTLES DRAWN AEROBIC AND ANAEROBIC 5MLS  Final   Culture NO GROWTH 3 DAYS  Final   Report Status PENDING  Incomplete  Culture, blood (routine x 2) Call MD if unable to obtain prior to antibiotics being given     Status: None (Preliminary result)   Collection Time: 10/23/15  9:12 AM  Result Value Ref Range Status   Specimen Description RIGHT ANTECUBITAL  Final   Special Requests BOTTLES DRAWN AEROBIC ONLY 5CC  Final   Culture NO GROWTH 3 DAYS  Final   Report Status PENDING  Incomplete  MRSA PCR Screening     Status: Abnormal   Collection Time: 10/23/15 12:14 PM  Result Value Ref Range Status   MRSA by PCR POSITIVE (A) NEGATIVE Final    Comment:        The GeneXpert MRSA Assay (FDA approved for NASAL specimens only), is one component of a comprehensive MRSA colonization surveillance program. It is not intended to diagnose MRSA infection nor to guide or monitor treatment for MRSA infections. RESULT CALLED TO, READ BACK BY AND VERIFIED WITH: RN T HARVEY AT [phone] AB:[id number] MARTINB      Studies: Dg Chest Port 1 View  10/26/2015  CLINICAL  DATA:  CHF.  GI bleed. EXAM: PORTABLE CHEST 1 VIEW COMPARISON:  10/22/2015 chest radiograph. FINDINGS: Stable cardiomediastinal silhouette with mild cardiomegaly. No pneumothorax. No pleural effusion. Low lung volumes. Mild pulmonary edema. Curvilinear opacities at the left lung base, favor atelectasis. IMPRESSION: 1. Mild cardiomegaly and mild pulmonary edema, in keeping with mild congestive heart failure. 2. Curvilinear left basilar lung opacities, favor atelectasis. Electronically Signed   By: Ilona Sorrel M.D.   On: 10/26/2015 08:19  Dg Swallowing Func-speech Pathology  10/25/2015  Objective Swallowing Evaluation: Type of Study: MBS-Modified Barium Swallow Study Patient Details Name: HALL ASHBECK MRN: [id number] Date of Birth: [birth year] Today's Date: 10/25/2015 Time: SLP Start Time (ACUTE ONLY): 1030-SLP Stop Time (ACUTE ONLY): 1100 SLP Time Calculation (min) (ACUTE ONLY): 30 min Past Medical History: Past Medical History Diagnosis Date . Anemia    nos . History of colonic polyps  . Hyperlipidemia  . Hypertension  . Cancer ([place]) [id number]   hx of basal cell . BPH (benign prostatic hyperplasia)  . Anxiety attack    panic  . Pre-syncope    echo 03/2010,with EF 60%,mild RV dilation,no significant abnormalities...event monitor for 3wks/episodes os sinus bradycardia with heart rate to low 40's occasionally at night(suspect while sleeping) . Atrial fibrillation ([place])    a. Dx 11/2012 - TEE with possible LAA thrombus, started on amio/coumadin. . Systolic CHF ([place])    a. EF 25-30% 11/2012 ?tachy-mediated. b. Not on ACEI at dc due to AKI, can consider as OP. [name] CAD (coronary artery disease)    a. Mod CAD by cath 12/10/12 - 60% long proximal LAD, 60% prox PDA. [name] AKI (acute kidney injury) ([place])    a. Peak Cr 4.17 11/2012 - felt to be post-obstructive. . Urinary retention    a. Post obstructive, dc'd with foley 12/2012. [name] Renal cyst    a. By renal US 11/2012, not candidate for further eval. . Thrombus of left atrial appendage     a. Possible LAA thrombus by TEE 11/2012. Past Surgical History: Past Surgical History Procedure Laterality Date . Colonoscopy w/ polypectomy  2007 . Cholecystectomy   . Total knee arthroplasty   . Mohs surgery  2010 . Tee without cardioversion N/A 12/11/2012   Procedure: TRANSESOPHAGEAL ECHOCARDIOGRAM (TEE);  Surgeon: Thayer Headings, MD;  Location: [facility];  Service: Cardiovascular;  Laterality: N/A;  ja/bev. [place] Dates without cardioversion N/A 01/21/2013   Procedure: TRANSESOPHAGEAL ECHOCARDIOGRAM (TEE);  Surgeon: Larey Dresser, MD;  Location: [facility];  Service: Cardiovascular;  Laterality: N/A; . Cardioversion N/A 01/21/2013   Procedure: CARDIOVERSION;  Surgeon: Larey Dresser, MD;  Location: [place];  Service: Cardiovascular;  Laterality: N/A; . Left heart catheterization with coronary angiogram N/A 12/10/2012   Procedure: LEFT HEART CATHETERIZATION WITH CORONARY ANGIOGRAM;  Surgeon: Larey Dresser, MD;  Location: [facility] CATH LAB;  Service: Cardiovascular;  Laterality: N/A; HPI: JABALI EVERAGE is a 82 y.o. male who vomited up black fluid last night X 1 and has been having left-sided pain since middle of the week. Black vomit covered his chest and also went onto his blanket and on the floor and his daughter saw it after it had occurred. Denies epigastric pain. Reports chronic abdominal bloating. Denies melena or hematochezia. Poor PO intake per his daughter who is bedside. He denies ever vomiting blood up before and thinks he had an EGD in the past but is not sure. Reports colonoscopy over 10 years ago but records not available. INR 4.6 on chronic Coumadin. Hgb 9.5. Hemoccult negative. Denies any recent Abx (daughter reports last Abx was in 06/2015). Patient is oriented X 3.  Current chest x-ray showing questionable PNA.   Subjective: The patient was seen sitting upright in bed just having eaten breakfast and taken his medications whole in puree.  Assessment / Plan / Recommendation CHL IP CLINICAL IMPRESSIONS  10/25/2015 Therapy Diagnosis Mild pharyngeal phase dysphagia;Suspected primary esophageal dysphagia Clinical Impression Pt demonstrates mild deficits leading to trace residuals post swallow,  though no gross weakness. There is mild premature spill over the base of tongue, but otherwise there was no delay in swallow response. Pt did silently aspirate puree once during esophageal sweep, so event was not captured. Suspect pt was talking while puree was at base of tongue, leading to aspiration before the swallow. subsequent trials were normal. Of concern was that barium tablet would not pass at the GE junction and corkscrew-like spasms were seen at the distal esophagus (no radiologist present to confirm). GI could review these images if needed. Recommend a dys 3 diet with thin liquids with moderate risk of aspiration given positioning, suspected esophageal dysphagia and likely poor compliance with suggestions to avoid talking while eating. SLP will f/u to reinforce.  Impact on safety and function Moderate aspiration risk   CHL IP TREATMENT RECOMMENDATION 10/25/2015 Treatment Recommendations Therapy as outlined in treatment plan below   Prognosis 10/25/2015 Prognosis for Safe Diet Advancement Fair Barriers to Reach Goals -- Barriers/Prognosis Comment -- CHL IP DIET RECOMMENDATION 10/25/2015 SLP Diet Recommendations Dysphagia 3 (Mech soft) solids;Thin liquid Liquid Administration via Cup;Straw Medication Administration Whole meds with puree Compensations Minimize environmental distractions;Slow rate;Small sips/bites Postural Changes Seated upright at 90 degrees;Remain semi-upright after after feeds/meals (Comment)   CHL IP OTHER RECOMMENDATIONS 10/25/2015 Recommended Consults -- Oral Care Recommendations Oral care BID Other Recommendations --   CHL IP FOLLOW UP RECOMMENDATIONS 10/25/2015 Follow up Recommendations Skilled Nursing facility   [facility] IP FREQUENCY AND DURATION 10/25/2015 Speech Therapy Frequency (ACUTE ONLY) min 2x/week  Treatment Duration 2 weeks      CHL IP ORAL PHASE 10/25/2015 Oral Phase Impaired Oral - Pudding Teaspoon -- Oral - Pudding Cup -- Oral - Honey Teaspoon -- Oral - Honey Cup -- Oral - Nectar Teaspoon -- Oral - Nectar Cup -- Oral - Nectar Straw -- Oral - Thin Teaspoon -- Oral - Thin Cup -- Oral - Thin Straw -- Oral - Puree -- Oral - Mech Soft -- Oral - Regular -- Oral - Multi-Consistency -- Oral - Pill -- Oral Phase - Comment mild premature spill to valleculae  CHL IP PHARYNGEAL PHASE 10/25/2015 Pharyngeal Phase Impaired Pharyngeal- Pudding Teaspoon -- Pharyngeal -- Pharyngeal- Pudding Cup -- Pharyngeal -- Pharyngeal- Honey Teaspoon -- Pharyngeal -- Pharyngeal- Honey Cup -- Pharyngeal -- Pharyngeal- Nectar Teaspoon -- Pharyngeal -- Pharyngeal- Nectar Cup -- Pharyngeal -- Pharyngeal- Nectar Straw -- Pharyngeal -- Pharyngeal- Thin Teaspoon -- Pharyngeal -- Pharyngeal- Thin Cup Pharyngeal residue - valleculae;Pharyngeal residue - pyriform Pharyngeal -- Pharyngeal- Thin Straw Pharyngeal residue - valleculae;Pharyngeal residue - pyriform Pharyngeal -- Pharyngeal- Puree Pharyngeal residue - valleculae;Penetration/Apiration after swallow Pharyngeal -- Pharyngeal- Mechanical Soft Pharyngeal residue - valleculae;Pharyngeal residue - pyriform Pharyngeal -- Pharyngeal- Regular -- Pharyngeal -- Pharyngeal- Multi-consistency -- Pharyngeal -- Pharyngeal- Pill -- Pharyngeal -- Pharyngeal Comment --  No flowsheet data found. CHL IP GO 10/24/2015 Functional Assessment Tool Used ASHA NOMS and clinical judgment.   Functional Limitations Swallowing Swallow Current Status [phone]) CJ Swallow Goal Status [id number]) [place] Swallow Discharge Status [phone]) (None) Motor Speech Current Status [phone]) (None) Motor Speech Goal Status [phone]) (None) Motor Speech Goal Status [phone]) (None) Spoken Language Comprehension Current Status [phone]) (None) Spoken Language Comprehension Goal Status [id number]) (None) Spoken Language Comprehension Discharge Status [id number])  (None) Spoken Language Expression Current Status [phone]) (None) Spoken Language Expression Goal Status [id number]) (None) Spoken Language Expression Discharge Status [id number]) (None) Attention Current Status [id number]) (None) Attention Goal Status [id number]) (None) Attention Discharge Status [id number]) (None) Memory Current Status [id number]) (None) Memory Goal Status (  G9169) (None) Memory Discharge Status [phone]) (None) Voice Current Status [phone]) (None) Voice Goal Status [id number]) (None) Voice Discharge Status [phone]) (None) Other Speech-Language Pathology Functional Limitation [phone]) (None) Other Speech-Language Pathology Functional Limitation Goal Status [id number]) (None) Other Speech-Language Pathology Functional Limitation Discharge Status [phone]) (None) Herbie Baltimore, MA CCC-SLP [phone] [place], Katherene Ponto 10/25/2015, 11:31 AM               Scheduled Meds: . sodium chloride   Intravenous Once  . acidophilus  1 capsule Oral Daily  . amiodarone  100 mg Oral Daily  . azithromycin  500 mg Intravenous Q24H  . buPROPion  75 mg Oral BID WC  . cefTRIAXone (ROCEPHIN)  IV  1 g Intravenous Q24H  . Chlorhexidine Gluconate Cloth  6 each Topical Q0600  . feeding supplement (ENSURE ENLIVE)  237 mL Oral TID BM  . l-methylfolate-B6-B12  1 tablet Oral BID  . mirabegron ER  25 mg Oral Daily  . mupirocin ointment  1 application Nasal BID  . pantoprazole (PROTONIX) IV  40 mg Intravenous Q12H  . pravastatin  80 mg Oral q1800  . sodium chloride flush  3 mL Intravenous Q12H  . warfarin  0.5 mg Oral ONCE-1800  . Warfarin - Pharmacist Dosing Inpatient   Does not apply q1800   Continuous Infusions: . sodium chloride 50 mL/hr at 10/25/15 1800    Principal Problem:   Upper GI bleed Active Problems:   Essential hypertension   AKI (acute kidney injury) ([facility])   Chronic systolic CHF (congestive heart failure) ([place])   Long term (current) use of anticoagulants   GI bleed   Tamber Burtch, [place] [facility] Pager [phone].  If 7PM-7AM, please contact night-coverage at [email or web address], password [facility] 10/26/2015, 2:34 PM  LOS: 4 days

## 2015-10-26 NOTE — Progress Notes (Signed)
ANTICOAGULATION CONSULT NOTE - Follow Up Consult  Pharmacy Consult for Coumadin Indication: atrial fibrillation and LAA thrombus  Allergies  Allergen Reactions  . Darifenacin Hydrobromide     REACTION: constipation  . Fluoxetine Hcl     REACTION: Very Depressed while taking   Patient Measurements: Height: 6' (182.9 cm) Weight: 186 lb 1.1 oz (84.4 kg) IBW/kg (Calculated) : 77.6  Vital Signs: Temp: 98.2 F (36.8 C) (03/07 1230) Temp Source: Oral (03/07 1230) BP: 116/51 mmHg (03/07 1230) Pulse Rate: 64 (03/07 1230)  Labs:  Recent Labs  10/24/15 0100 10/25/15 0229 10/25/15 1444 10/26/15 0253  HGB 7.5* 7.4*  --  6.9*  HCT 23.9* 23.8*  --  21.4*  PLT 173 193  --  194  LABPROT  --   --  22.7* 23.9*  INR  --   --  2.02* 2.16*  CREATININE 1.52* 1.07  --  1.00    Estimated Creatinine Clearance: 55 mL/min (by C-G formula based on Cr of 1).   Medical History: Past Medical History  Diagnosis Date  . Anemia     nos  . History of colonic polyps   . Hyperlipidemia   . Hypertension   . Cancer ([place]) [id number]    hx of basal cell  . BPH (benign prostatic hyperplasia)   . Anxiety attack     panic   . Pre-syncope     echo 03/2010,with EF 60%,mild RV dilation,no significant abnormalities...event monitor for 3wks/episodes os sinus bradycardia with heart rate to low 40's occasionally at night(suspect while sleeping)  . Atrial fibrillation ([place])     a. Dx 11/2012 - TEE with possible LAA thrombus, started on amio/coumadin.  . Systolic CHF ([place])     a. EF 25-30% 11/2012 ?tachy-mediated. b. Not on ACEI at dc due to AKI, can consider as OP.  [name] CAD (coronary artery disease)     a. Mod CAD by cath 12/10/12 - 60% long proximal LAD, 60% prox PDA.  [name] AKI (acute kidney injury) ([place])     a. Peak Cr 4.17 11/2012 - felt to be post-obstructive.  . Urinary retention     a. Post obstructive, dc'd with foley 12/2012.  [name] Renal cyst     a. By renal US 11/2012, not candidate for further eval.  .  Thrombus of left atrial appendage     a. Possible LAA thrombus by TEE 11/2012.    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 Coumadin was held on admission due to coffee ground emesis. GI was consulted and EGD performed- no active bleeding but mild distal esophagitis seen thought to be source of blood. Pharmacy has been consulted to resume Coumadin.  Patient's INR continues to trend up despite only getting one Coumadin dose since 3/2. Will give conservative doses   Prior home Coumadin dose: 1.25mg  daily except 2.5mg  on MWF.    Goal of Therapy:  INR 2-3 Monitor platelets by anticoagulation protocol: Yes   Plan:  Coumadin 0.5 mg once today  Daily PT/INR while on therapy.    Albertina Parr, PharmD., BCPS Clinical Pharmacist Pager [phone]

## 2015-10-26 NOTE — Progress Notes (Signed)
[facility] ADULT ICU REPLACEMENT PROTOCOL FOR AM LAB REPLACEMENT ONLY  The patient does apply for the [facility] Adult ICU Electrolyte Replacment Protocol based on the criteria listed below:   1. Is GFR >/= 40 ml/min? Yes.    Patient's GFR today is >60 2. Is urine output >/= 0.5 ml/kg/hr for the last 6 hours? Yes.   Patient's UOP is 1.6 ml/kg/hr 3. Is BUN < 60 mg/dL? Yes.    Patient's BUN today is 14 4. Abnormal electrolyte(s): K 3.3 5. Ordered repletion with: per protocol 6. If a panic level lab has been reported, has the CCM MD in charge been notified? No..   Physician:    Ronda Fairly A 10/26/2015 6:24 AM

## 2015-10-27 DIAGNOSIS — L8990 Pressure ulcer of unspecified site, unspecified stage: Secondary | ICD-10-CM | POA: Insufficient documentation

## 2015-10-27 DIAGNOSIS — K922 Gastrointestinal hemorrhage, unspecified: Secondary | ICD-10-CM

## 2015-10-27 LAB — CBC
HCT: 26.5 % — ABNORMAL LOW (ref 39.0–52.0)
Hemoglobin: 8.7 g/dL — ABNORMAL LOW (ref 13.0–17.0)
MCH: 28.8 pg (ref 26.0–34.0)
MCHC: 32.8 g/dL (ref 30.0–36.0)
MCV: 87.7 fL (ref 78.0–100.0)
PLATELETS: 232 10*3/uL (ref 150–400)
RBC: 3.02 MIL/uL — AB (ref 4.22–5.81)
RDW: 16.7 % — AB (ref 11.5–15.5)
WBC: 12.1 10*3/uL — AB (ref 4.0–10.5)

## 2015-10-27 LAB — PROTIME-INR
INR: 2.02 — AB (ref 0.00–1.49)
Prothrombin Time: 22.7 seconds — ABNORMAL HIGH (ref 11.6–15.2)

## 2015-10-27 LAB — TYPE AND SCREEN
ABO/RH(D): O POS
ANTIBODY SCREEN: NEGATIVE
Unit division: 0

## 2015-10-27 LAB — BASIC METABOLIC PANEL
Anion gap: 8 (ref 5–15)
BUN: 14 mg/dL (ref 6–20)
CALCIUM: 8.1 mg/dL — AB (ref 8.9–10.3)
CO2: 26 mmol/L (ref 22–32)
CREATININE: 0.89 mg/dL (ref 0.61–1.24)
Chloride: 108 mmol/L (ref 101–111)
Glucose, Bld: 97 mg/dL (ref 65–99)
Potassium: 3.2 mmol/L — ABNORMAL LOW (ref 3.5–5.1)
SODIUM: 142 mmol/L (ref 135–145)

## 2015-10-27 MED ORDER — WARFARIN 0.5 MG HALF TABLET
0.5000 mg | ORAL_TABLET | Freq: Once | ORAL | Status: AC
  Administered 2015-10-27: 0.5 mg via ORAL
  Filled 2015-10-27: qty 1

## 2015-10-27 MED ORDER — POTASSIUM CHLORIDE CRYS ER 20 MEQ PO TBCR
40.0000 meq | EXTENDED_RELEASE_TABLET | Freq: Once | ORAL | Status: AC
  Administered 2015-10-27: 40 meq via ORAL
  Filled 2015-10-27: qty 2

## 2015-10-27 NOTE — Progress Notes (Signed)
PROGRESS NOTE  [name] [id number] DOB: Jan 13, 1925 DOA: 10/22/2015 PCP: No primary care provider on file. Outpatient Specialists:    LOS: 5 days   Brief Narrative: 82 y.o. male with PMH significant for Systolic HF Ef 30 to 35 % by ECHO 2014, BPH, chronic foley catheter, A fib on Coumadin, CKD stage III cr range 1.2 to 1.7 who presents with coffee ground emesis  Assessment & Plan: Principal Problem:   Upper GI bleed Active Problems:   Essential hypertension   AKI (acute kidney injury) ([place])   Chronic systolic CHF (congestive heart failure) ([place])   Long term (current) use of anticoagulants   GI bleed   GI bleed, Coffee ground emesis;  - Patient presented with supra-therapeutic INR.  - Coumadin was placed on hold and patient received one unit of FFP with Vitamin K, FFP - Patient was started on Protonix, continue  - Patient has been seen and evaluated by GI and underwent EGD on 3/4 with findings of gastritis and esophagitis - Dr. Wyline Copas discussed with GI. OK to resume anticoagulation. Closely monitor CBC Acute on chronic renal failure, stage III. Prior cr range 1.2 to 1.7.  - Suspect related to hypovolemia, hypotension, anemia - Improved with IV fluids, now resumed Lasix. Renal function stable Left side abdominal pain: KUB unremarkable Systolic HF; appears compensated.  - His diuretics were initially on hold as he received gentle hydration, then his Lasix was resumed 3/7  A fib: continue coumadin. Continue with amiodarone. Rate controlled   DVT prophylaxis: Coumadin Code Status: Full Family Communication: d/w patient  Disposition Plan: home in 1 day if CBC stable  Consultants:   GI  Procedures:   None   Antimicrobials:  Ceftriaxone 3/4 >>  Azithromycin 3/4 >>  Subjective: - no complaints, doing well, eating breakfast. No nausea/vomiting.   Objective: Filed Vitals:   10/26/15 2215 10/27/15 0559 10/27/15 0606 10/27/15 1255  BP: 139/58  139/69 119/51    Pulse: 61 104 74 70  Temp:  98.5 F (36.9 C) 98.2 F (36.8 C) 97.7 F (36.5 C)  TempSrc:  Oral  Oral  Resp:   18 18  Height:      Weight:   79.652 kg (175 lb 9.6 oz)   SpO2: 95% 97% 96% 97%    Intake/Output Summary (Last 24 hours) at 10/27/15 1314 Last data filed at 10/27/15 1255  Gross per 24 hour  Intake    210 ml  Output   4402 ml  Net  -4192 ml   Filed Weights   10/25/15 0500 10/26/15 0500 10/27/15 0606  Weight: 81.7 kg (180 lb 1.9 oz) 84.4 kg (186 lb 1.1 oz) 79.652 kg (175 lb 9.6 oz)    Examination: BP 119/51 mmHg  Pulse 70  Temp(Src) 97.7 F (36.5 C) (Oral)  Resp 18  Ht 6' (1.829 m)  Wt 79.652 kg (175 lb 9.6 oz)  BMI 23.81 kg/m2  SpO2 97% General exam: NAD Respiratory system: Clear. No increased work of breathing. No wheezing, no crackles Cardiovascular system: irregular. No JVD. Trace peripheral edema.  Gastrointestinal system: Abdomen is nondistended, soft and nontender. Normal bowel sounds heard. Central nervous system: AxOx3. No focal deficits Extremities: No clubbing/cyanosis Skin: no rashes   Data Reviewed: I have personally reviewed following labs and imaging studies  CBC:  Recent Labs Lab 10/24/15 0100 10/25/15 0229 10/26/15 0253 10/26/15 1551 10/27/15 0500  WBC 12.4* 10.3 9.7 11.1* 12.1*  HGB 7.5* 7.4* 6.9* 8.6* 8.7*  HCT 23.9* 23.8* 21.4*  26.2* 26.5*  MCV 88.2 89.1 88.8 88.5 87.7  PLT 173 193 194 204 [id number]   Basic Metabolic Panel:  Recent Labs Lab 10/23/15 0155 10/24/15 0100 10/25/15 0229 10/26/15 0253 10/27/15 0500  NA 137 141 141 142 142  K 3.9 2.9* 3.6 3.3* 3.2*  CL 103 105 110 109 108  CO2 21* 24 22 22 26   GLUCOSE 101* 120* 110* 104* 97  BUN 43* 34* 21* 14 14  CREATININE 2.68* 1.52* 1.07 1.00 0.89  CALCIUM 8.7* 8.5* 8.1* 8.2* 8.1*   GFR: Estimated Creatinine Clearance: 61.8 mL/min (by C-G formula based on Cr of 0.89). Liver Function Tests:  Recent Labs Lab 10/22/15 1424 10/24/15 0100  AST 39 57*  ALT 29 30   ALKPHOS 117 94  BILITOT 0.6 0.5  PROT 6.9 5.5*  ALBUMIN 2.5* 2.1*   No results for input(s): LIPASE, AMYLASE in the last 168 hours. No results for input(s): AMMONIA in the last 168 hours. Coagulation Profile:  Recent Labs Lab 10/22/15 1424 10/23/15 0508 10/25/15 1444 10/26/15 0253 10/27/15 0500  INR 4.63* 1.75* 2.02* 2.16* 2.02*   Cardiac Enzymes: No results for input(s): CKTOTAL, CKMB, CKMBINDEX, TROPONINI in the last 168 hours. BNP (last 3 results) No results for input(s): PROBNP in the last 8760 hours. HbA1C: No results for input(s): HGBA1C in the last 72 hours. CBG:  Recent Labs Lab 10/23/15 1236  GLUCAP 105*   Lipid Profile: No results for input(s): CHOL, HDL, LDLCALC, TRIG, CHOLHDL, LDLDIRECT in the last 72 hours. Thyroid Function Tests: No results for input(s): TSH, T4TOTAL, FREET4, T3FREE, THYROIDAB in the last 72 hours. Anemia Panel: No results for input(s): VITAMINB12, FOLATE, FERRITIN, TIBC, IRON, RETICCTPCT in the last 72 hours. Urine analysis:    Component Value Date/Time   COLORURINE GREEN* 10/22/2015 1910   APPEARANCEUR CLOUDY* 10/22/2015 1910   LABSPEC 1.018 10/22/2015 1910   PHURINE 7.0 10/22/2015 1910   GLUCOSEU NEGATIVE 10/22/2015 1910   HGBUR SMALL* 10/22/2015 1910   BILIRUBINUR SMALL* 10/22/2015 1910   KETONESUR NEGATIVE 10/22/2015 1910   PROTEINUR 30* 10/22/2015 1910   UROBILINOGEN 0.2 11/13/2014 1944   NITRITE NEGATIVE 10/22/2015 1910   LEUKOCYTESUR MODERATE* 10/22/2015 1910   Sepsis Labs: Invalid input(s): PROCALCITONIN, LACTICIDVEN  Recent Results (from the past 240 hour(s))  Urine culture     Status: None   Collection Time: 10/22/15  7:11 PM  Result Value Ref Range Status   Specimen Description URINE, RANDOM  Final   Special Requests NONE  Final   Culture MULTIPLE SPECIES PRESENT, SUGGEST RECOLLECTION  Final   Report Status 10/24/2015 FINAL  Final  Culture, blood (routine x 2) Call MD if unable to obtain prior to antibiotics  being given     Status: None (Preliminary result)   Collection Time: 10/23/15  8:23 AM  Result Value Ref Range Status   Specimen Description BLOOD LEFT ANTECUBITAL  Final   Special Requests BOTTLES DRAWN AEROBIC AND ANAEROBIC 5MLS  Final   Culture NO GROWTH 3 DAYS  Final   Report Status PENDING  Incomplete  Culture, blood (routine x 2) Call MD if unable to obtain prior to antibiotics being given     Status: None (Preliminary result)   Collection Time: 10/23/15  9:12 AM  Result Value Ref Range Status   Specimen Description RIGHT ANTECUBITAL  Final   Special Requests BOTTLES DRAWN AEROBIC ONLY 5CC  Final   Culture NO GROWTH 3 DAYS  Final   Report Status PENDING  Incomplete  MRSA PCR  Screening     Status: Abnormal   Collection Time: 10/23/15 12:14 PM  Result Value Ref Range Status   MRSA by PCR POSITIVE (A) NEGATIVE Final    Comment:        The GeneXpert MRSA Assay (FDA approved for NASAL specimens only), is one component of a comprehensive MRSA colonization surveillance program. It is not intended to diagnose MRSA infection nor to guide or monitor treatment for MRSA infections. RESULT CALLED TO, READ BACK BY AND VERIFIED WITH: RN T HARVEY AT [id number] AB:[id number] [place]       Radiology Studies: Dg Chest Port 1 View  10/26/2015  CLINICAL DATA:  CHF.  GI bleed. EXAM: PORTABLE CHEST 1 VIEW COMPARISON:  10/22/2015 chest radiograph. FINDINGS: Stable cardiomediastinal silhouette with mild cardiomegaly. No pneumothorax. No pleural effusion. Low lung volumes. Mild pulmonary edema. Curvilinear opacities at the left lung base, favor atelectasis. IMPRESSION: 1. Mild cardiomegaly and mild pulmonary edema, in keeping with mild congestive heart failure. 2. Curvilinear left basilar lung opacities, favor atelectasis. Electronically Signed   By: Ilona Sorrel M.D.   On: 10/26/2015 08:19     Scheduled Meds: . acidophilus  1 capsule Oral Daily  . amiodarone  100 mg Oral Daily  . azithromycin  500 mg  Intravenous Q24H  . buPROPion  75 mg Oral BID WC  . cefTRIAXone (ROCEPHIN)  IV  1 g Intravenous Q24H  . Chlorhexidine Gluconate Cloth  6 each Topical Q0600  . feeding supplement (ENSURE ENLIVE)  237 mL Oral TID BM  . furosemide  40 mg Oral Daily  . l-methylfolate-B6-B12  1 tablet Oral BID  . mirabegron ER  25 mg Oral Daily  . mupirocin ointment  1 application Nasal BID  . pantoprazole (PROTONIX) IV  40 mg Intravenous Q12H  . pravastatin  80 mg Oral q1800  . sodium chloride flush  3 mL Intravenous Q12H  . Warfarin - Pharmacist Dosing Inpatient   Does not apply q1800   Continuous Infusions:    Marzetta Board, MD, PhD [facility] Pager [phone]  If 7PM-7AM, please contact night-coverage [email or web address] Password [email or web address] 10/27/2015, 1:14 PM

## 2015-10-27 NOTE — Progress Notes (Signed)
MD informed CSW about potential need for SNF pending PT consult.  CSW met with pt to discuss potential short term care- pt states that he will not go to a SNF- reports that he lives with his daughter and gets 24 hour private caregiving  CSW informed RNCM of pt desire to return home  CSW signing off  Domenica Reamer, [place] Worker [phone]

## 2015-10-27 NOTE — Progress Notes (Signed)
ANTICOAGULATION CONSULT NOTE - Follow Up Consult  Pharmacy Consult for Coumadin Indication: atrial fibrillation and LAA thrombus  Allergies  Allergen Reactions  . Darifenacin Hydrobromide     REACTION: constipation  . Fluoxetine Hcl     REACTION: Very Depressed while taking   Patient Measurements: Height: 6' (182.9 cm) Weight: 175 lb 9.6 oz (79.652 kg) (with knee brace) IBW/kg (Calculated) : 77.6  Vital Signs: Temp: 97.7 F (36.5 C) (03/08 1255) Temp Source: Oral (03/08 1255) BP: 119/51 mmHg (03/08 1255) Pulse Rate: 70 (03/08 1255)  Labs:  Recent Labs  10/25/15 0229 10/25/15 1444 10/26/15 0253 10/26/15 1551 10/27/15 0500  HGB 7.4*  --  6.9* 8.6* 8.7*  HCT 23.8*  --  21.4* 26.2* 26.5*  PLT 193  --  194 204 232  LABPROT  --  22.7* 23.9*  --  22.7*  INR  --  2.02* 2.16*  --  2.02*  CREATININE 1.07  --  1.00  --  0.89    Estimated Creatinine Clearance: 61.8 mL/min (by C-G formula based on Cr of 0.89).   Medical History: Past Medical History  Diagnosis Date  . Anemia     nos  . History of colonic polyps   . Hyperlipidemia   . Hypertension   . Cancer ([place]) [id number]    hx of basal cell  . BPH (benign prostatic hyperplasia)   . Anxiety attack     panic   . Pre-syncope     echo 03/2010,with EF 60%,mild RV dilation,no significant abnormalities...event monitor for 3wks/episodes os sinus bradycardia with heart rate to low 40's occasionally at night(suspect while sleeping)  . Atrial fibrillation ([place])     a. Dx 11/2012 - TEE with possible LAA thrombus, started on amio/coumadin.  . Systolic CHF ([place])     a. EF 25-30% 11/2012 ?tachy-mediated. b. Not on ACEI at dc due to AKI, can consider as OP.  [name] CAD (coronary artery disease)     a. Mod CAD by cath 12/10/12 - 60% long proximal LAD, 60% prox PDA.  [name] AKI (acute kidney injury) ([place])     a. Peak Cr 4.17 11/2012 - felt to be post-obstructive.  . Urinary retention     a. Post obstructive, dc'd with foley 12/2012.  [name] Renal  cyst     a. By renal US 11/2012, not candidate for further eval.  . Thrombus of left atrial appendage     a. Possible LAA thrombus by TEE 11/2012.    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 Coumadin was held on admission due to coffee ground emesis. GI was consulted and EGD performed- no active bleeding but mild distal esophagitis seen thought to be source of blood. Pharmacy has been consulted to resume Coumadin. PO intake looks to be improving. Will continue with conservative doses.  Prior home Coumadin dose: 1.25mg  daily except 2.5mg  on MWF.    Goal of Therapy:  INR 2-3 Monitor platelets by anticoagulation protocol: Yes   Plan:  Coumadin 0.5 mg once today  Daily PT/INR while on therapy.    Thank you for allowing [place] to participate in this patients care. Jens Som, PharmD Pager: [phone] 10/27/2015 1:26 PM

## 2015-10-27 NOTE — Progress Notes (Signed)
PT Cancellation Note  Patient Details Name: [name] MRN: [id number] DOB: 07/06/1925   Cancelled Treatment:    Reason Eval/Treat Not Completed: Other (comment);Fatigue/lethargy limiting ability to participate (Pt refused stating he could not tolerate PT).  States he already has HHPT but did not want hospital therapy yet.  Will try again tomorrow.   [name] 10/27/2015, 11:21 AM   Mee Hives, PT MS Acute Rehab Dept. Number: [facility] [id number] and [place] [phone]

## 2015-10-27 NOTE — Discharge Instructions (Addendum)
Information on my medicine - Coumadin®   (Warfarin) ° °This medication education was reviewed with me or my healthcare representative as part of my discharge preparation.   ° °Why was Coumadin prescribed for you? °Coumadin was prescribed for you because you have a blood clot or a medical condition that can cause an increased risk of forming blood clots. Blood clots can cause serious health problems by blocking the flow of blood to the heart, lung, or brain. Coumadin can prevent harmful blood clots from forming. °As a reminder your indication for Coumadin is:   Stroke Prevention Because Of Atrial Fibrillation ° °What test will check on my response to Coumadin? °While on Coumadin (warfarin) you will need to have an INR test regularly to ensure that your dose is keeping you in the desired range. The INR (international normalized ratio) number is calculated from the result of the laboratory test called prothrombin time (PT). ° °If an INR APPOINTMENT HAS NOT ALREADY BEEN MADE FOR YOU please schedule an appointment to have this lab work done by your health care provider within 7 days. °Your INR goal is usually a number between:  2 to 3 or your provider may give you a more narrow range like 2-2.5.  Ask your health care provider during an office visit what your goal INR is. ° °What  do you need to  know  About  COUMADIN? °Take Coumadin (warfarin) exactly as prescribed by your healthcare provider about the same time each day.  DO NOT stop taking without talking to the doctor who prescribed the medication.  Stopping without other blood clot prevention medication to take the place of Coumadin may increase your risk of developing a new clot or stroke.  Get refills before you run out. ° °What do you do if you miss a dose? °If you miss a dose, take it as soon as you remember on the same day then continue your regularly scheduled regimen the next day.  Do not take two doses of Coumadin at the same time. ° °Important Safety  Information °A possible side effect of Coumadin (Warfarin) is an increased risk of bleeding. You should call your healthcare provider right away if you experience any of the following: °? Bleeding from an injury or your nose that does not stop. °? Unusual colored urine (red or dark brown) or unusual colored stools (red or black). °? Unusual bruising for unknown reasons. °? A serious fall or if you hit your head (even if there is no bleeding). ° °Some foods or medicines interact with Coumadin® (warfarin) and might alter your response to warfarin. To help avoid this: °? Eat a balanced diet, maintaining a consistent amount of Vitamin K. °? Notify your provider about major diet changes you plan to make. °? Avoid alcohol or limit your intake to 1 drink for women and 2 drinks for men per day. °(1 drink is 5 oz. wine, 12 oz. beer, or 1.5 oz. liquor.) ° °Make sure that ANY health care provider who prescribes medication for you knows that you are taking Coumadin (warfarin).  Also make sure the healthcare provider who is monitoring your Coumadin knows when you have started a new medication including herbals and non-prescription products. ° °Coumadin® (Warfarin)  Major Drug Interactions  °Increased Warfarin Effect Decreased Warfarin Effect  °Alcohol (large quantities) °Antibiotics (esp. Septra/Bactrim, Flagyl, Cipro) °Amiodarone (Cordarone) °Aspirin (ASA) °Cimetidine (Tagamet) °Megestrol (Megace) °NSAIDs (ibuprofen, naproxen, etc.) °Piroxicam (Feldene) °Propafenone (Rythmol SR) °Propranolol (Inderal) °Isoniazid (INH) °Posaconazole (Noxafil) Barbiturates (Phenobarbital) °  Carbamazepine (Tegretol) Chlordiazepoxide (Librium) Cholestyramine (Questran) Griseofulvin Oral Contraceptives Rifampin Sucralfate (Carafate) Vitamin K   Coumadin (Warfarin) Major Herbal Interactions  Increased Warfarin Effect Decreased Warfarin Effect  Garlic Ginseng Ginkgo biloba Coenzyme Q10 Green tea St. Johns wort    Coumadin (Warfarin)  FOOD Interactions  Eat a consistent number of servings per week of foods HIGH in Vitamin K (1 serving =  cup)  Collards (cooked, or boiled & drained) Kale (cooked, or boiled & drained) Mustard greens (cooked, or boiled & drained) Parsley *serving size only =  cup Spinach (cooked, or boiled & drained) Swiss chard (cooked, or boiled & drained) Turnip greens (cooked, or boiled & drained)  Eat a consistent number of servings per week of foods MEDIUM-HIGH in Vitamin K (1 serving = 1 cup)  Asparagus (cooked, or boiled & drained) Broccoli (cooked, boiled & drained, or raw & chopped) Brussel sprouts (cooked, or boiled & drained) *serving size only =  cup Lettuce, raw (green leaf, endive, romaine) Spinach, raw Turnip greens, raw & chopped   These websites have more information on Coumadin (warfarin):  FailFactory.se; VeganReport.com.au;  Follow with No primary care provider on file. in 5-7 days  Please get a complete blood count and chemistry panel checked by your Primary MD at your next visit, and again as instructed by your Primary MD. Please get your medications reviewed and adjusted by your Primary MD.  Please request your Primary MD to go over all Hospital Tests and Procedure/Radiological results at the follow up, please get all Hospital records sent to your Prim MD by signing hospital release before you go home.  If you had Pneumonia of Lung problems at the Hospital: Please get a 2 view Chest X ray done in 6-8 weeks after hospital discharge or sooner if instructed by your Primary MD.  If you have Congestive Heart Failure: Please call your Cardiologist or Primary MD anytime you have any of the following symptoms:  1) 3 pound weight gain in 24 hours or 5 pounds in 1 week  2) shortness of breath, with or without a dry hacking cough  3) swelling in the hands, feet or stomach  4) if you have to sleep on extra pillows at night in order to breathe  Follow cardiac low  salt diet and 1.5 lit/day fluid restriction.  If you have diabetes Accuchecks 4 times/day, Once in AM empty stomach and then before each meal. Log in all results and show them to your primary doctor at your next visit. If any glucose reading is under 80 or above 300 call your primary MD immediately.  If you have Seizure/Convulsions/Epilepsy: Please do not drive, operate heavy machinery, participate in activities at heights or participate in high speed sports until you have seen by Primary MD or a Neurologist and advised to do so again.  If you had Gastrointestinal Bleeding: Please ask your Primary MD to check a complete blood count within one week of discharge or at your next visit. Your endoscopic/colonoscopic biopsies that are pending at the time of discharge, will also need to followed by your Primary MD.  Get Medicines reviewed and adjusted. Please take all your medications with you for your next visit with your Primary MD  Please request your Primary MD to go over all hospital tests and procedure/radiological results at the follow up, please ask your Primary MD to get all Hospital records sent to his/her office.  If you experience worsening of your admission symptoms, develop shortness of breath, life threatening  emergency, suicidal or homicidal thoughts you must seek medical attention immediately by calling 911 or calling your MD immediately  if symptoms less severe.  You must read complete instructions/literature along with all the possible adverse reactions/side effects for all the Medicines you take and that have been prescribed to you. Take any new Medicines after you have completely understood and accpet all the possible adverse reactions/side effects.   Do not drive or operate heavy machinery when taking Pain medications.   Do not take more than prescribed Pain, Sleep and Anxiety Medications  Special Instructions: If you have smoked or chewed Tobacco  in the last 2 yrs please  stop smoking, stop any regular Alcohol  and or any Recreational drug use.  Wear Seat belts while driving.  Please note You were cared for by a hospitalist during your hospital stay. If you have any questions about your discharge medications or the care you received while you were in the hospital after you are discharged, you can call the unit and asked to speak with the hospitalist on call if the hospitalist that took care of you is not available. Once you are discharged, your primary care physician will handle any further medical issues. Please note that NO REFILLS for any discharge medications will be authorized once you are discharged, as it is imperative that you return to your primary care physician (or establish a relationship with a primary care physician if you do not have one) for your aftercare needs so that they can reassess your need for medications and monitor your lab values.  You can reach the hospitalist office at phone [phone] or fax [phone]   If you do not have a primary care physician, you can call [phone] for a physician referral.  Activity: As tolerated with Full fall precautions use walker/cane & assistance as needed  Diet: heart healthy  Disposition Home

## 2015-10-27 NOTE — Progress Notes (Signed)
OT Cancellation Note  Patient Details Name: [name] MRN: [id number] DOB: Sep 18, 1924   Cancelled Treatment:    Reason Eval/Treat Not Completed: Pain limiting ability to participate.  Malka So 10/27/2015, 3:33 PM  [phone]

## 2015-10-27 NOTE — Progress Notes (Signed)
Speech Language Pathology Treatment: Dysphagia  Patient Details Name: KYRI CARPIO MRN: [id number] DOB: [birth year] Today's Date: 10/27/2015 Time: [id number] SLP Time Calculation (min) (ACUTE ONLY): 24 min  Assessment / Plan / Recommendation Clinical Impression  Dysphagia intervention during breakfast with pt requiring max verbal and visual cues to follow strategies (no talking immediately after swallow, alternate liquids and solids). Suspect chronic dysphagia from primary esophageal source given results of MBS. Pt educated/reviewed results of MBS and importance of strategies during meals. Pt initially resistant to reposition/raise head of bed. Indications of decreased airway protection include wet vocal quality, immediate cough and throat clear intermittently. Continue Dys 3 and thin liquids with reiteration of strategies. Continue ST intervention.     HPI HPI: JACE FREIER is a 82 y.o. male who vomited up black fluid last night X 1 and has been having left-sided pain since middle of the week. Black vomit covered his chest and also went onto his blanket and on the floor and his daughter saw it after it had occurred. Denies epigastric pain. Reports chronic abdominal bloating. Denies melena or hematochezia. Poor PO intake per his daughter who is bedside. He denies ever vomiting blood up before and thinks he had an EGD in the past but is not sure. Reports colonoscopy over 10 years ago but records not available. INR 4.6 on chronic Coumadin. Hgb 9.5. Hemoccult negative. Denies any recent Abx (daughter reports last Abx was in 06/2015). Patient is oriented X 3.  Current chest x-ray showing questionable PNA.        SLP Plan  Continue with current plan of care     Recommendations  Diet recommendations: Dysphagia 3 (mechanical soft);Thin liquid Liquids provided via: Cup;Straw Medication Administration: Whole meds with puree Supervision: Patient able to self feed;Intermittent supervision to cue for  compensatory strategies Compensations: Minimize environmental distractions;Slow rate;Small sips/bites Postural Changes and/or Swallow Maneuvers: Seated upright 90 degrees;Upright 30-60 min after meal             Oral Care Recommendations: Oral care BID Follow up Recommendations: Skilled Nursing facility Plan: Continue with current plan of care     GO                Houston Siren 10/27/2015, 9:47 AM   Orbie Pyo Colvin Caroli.Ed [employer] [phone]

## 2015-10-27 NOTE — Care Management Important Message (Signed)
Important Message  Patient Details  Name: CERRONE BARBUSH MRN: [id number] Date of Birth: 20-Jul-1925   Medicare Important Message Given:  Yes    Carles Collet, RN 10/27/2015, 1:16 PMImportant Message  Patient Details  Name: ZAKYE ARNALL MRN: [id number] Date of Birth: 05-23-1925   Medicare Important Message Given:  Yes    Carles Collet, RN 10/27/2015, 1:16 PM

## 2015-10-28 LAB — HEMOGLOBIN AND HEMATOCRIT, BLOOD
HCT: 27.3 % — ABNORMAL LOW (ref 39.0–52.0)
HEMOGLOBIN: 8.9 g/dL — AB (ref 13.0–17.0)

## 2015-10-28 LAB — CULTURE, BLOOD (ROUTINE X 2)
CULTURE: NO GROWTH
Culture: NO GROWTH

## 2015-10-28 LAB — PROTIME-INR
INR: 1.85 — ABNORMAL HIGH (ref 0.00–1.49)
Prothrombin Time: 21.3 seconds — ABNORMAL HIGH (ref 11.6–15.2)

## 2015-10-28 MED ORDER — PANTOPRAZOLE SODIUM 40 MG PO TBEC: 40.0000 mg | DELAYED_RELEASE_TABLET | Freq: Two times a day (BID) | ORAL | Status: AC

## 2015-10-28 MED ORDER — WARFARIN SODIUM 2.5 MG PO TABS
1.2500 mg | ORAL_TABLET | Freq: Once | ORAL | Status: DC
Start: 2015-10-28 — End: 2015-10-28

## 2015-10-28 MED ORDER — LEVOFLOXACIN 500 MG PO TABS
500.0000 mg | ORAL_TABLET | Freq: Every day | ORAL | Status: DC
Start: 2015-10-28 — End: 2016-01-25

## 2015-10-28 NOTE — Care Management Note (Signed)
Case Management Note  Patient Details  Name: STEADMAN MANGLICMOT MRN: [id number] Date of Birth: 23-Jan-1925  Subjective/Objective:                 Spoke with patient's daughter. She recognizes the need for her dad to go to SNF but also acknowledges that he refuses. She states that there are 24/7 HHA in the house, as her mom is on hospice service at home starting last week. Patient's daughter is at the home and is in the process of moving into the home. She asked for home health. HH with gentive RN PT OT HHA SW referral placed. Hospital bed referral placed to [facility] with request for delivery Friday. Patient will DC with [facility], daughter stated that anytime is good. They will come around 12:15 to take patient home. Forms printed and on front of chart. Bedside RN aware of plan.   Action/Plan:   Expected Discharge Date:                  Expected Discharge Plan:  [place]  In-House Referral:     Discharge planning Services  CM Consult  Post Acute Care Choice:  Home Health, Durable Medical Equipment Choice offered to:  Patient, Adult Children  DME Arranged:  Hospital bed DME Agency:  [place]:  RN, PT, OT, Nurse's Aide [facility] Agency:  [place]  Status of Service:  Completed, signed off  Medicare Important Message Given:  Yes Date Medicare IM Given:    Medicare IM give by:    Date Additional Medicare IM Given:    Additional Medicare Important Message give by:     If discussed at [place] of Stay Meetings, dates discussed:    Additional Comments:  Carles Collet, RN 10/28/2015, 11:14 AM

## 2015-10-28 NOTE — Progress Notes (Signed)
D/c via [facility] via stretcher to home .voices no c/o all d/c instructions given  W/ verbal understanding

## 2015-10-28 NOTE — Evaluation (Signed)
Occupational Therapy Evaluation and Discharge Summary Patient Details Name: [name] MRN: [id number] DOB: 1925/07/24 Today's Date: 10/28/2015    History of Present Illness Pt is an 82 yo male admitted for GI bleed after having coffee ground emisis. Pt with acute on chronic renal failure. 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   Clinical Impression   Pt admitted with the above diagnosis and overall appears to be at his baseline. Pt is dependent for most adls at baseline including toileting, bathing, dressing and meds.  Pt dependent on others for all mobility. Pt can feed self and groom on his own.  Pt has 24 hour caregivers at home. No further acute or post acute OT needs.    Follow Up Recommendations  No OT follow up;Supervision/Assistance - 24 hour    Equipment Recommendations  None recommended by OT    Recommendations for Other Services       Precautions / Restrictions Precautions Precautions: Fall Required Braces or Orthoses: Knee Immobilizer - Left;Other Brace/Splint (orthopedic shoes) Knee Immobilizer - Left: On at all times Other Brace/Splint: orthopedic built up shoes Restrictions Weight Bearing Restrictions: No      Mobility Bed Mobility Overal bed mobility: Needs Assistance Bed Mobility: Supine to Sit;Sit to Supine     Supine to sit: Mod assist;HOB elevated Sit to supine: +2 for physical assistance;Max assist   General bed mobility comments: Pt very rigid at baseline.  Has caregivers that assist him at home with all mobility.  Transfers Overall transfer level: Needs assistance Equipment used: Rolling walker (2 wheeled);2 person hand held assist Transfers: Sit to/from Stand Sit to Stand: +2 physical assistance;Max assist         General transfer comment: Pt with heavy posterior lean due to stiffness in B knees. Once he is up on his feet with assist +2 he can bring feet underneath him and stand with mod assist.    Balance Overall balance  assessment: Needs assistance Sitting-balance support: Feet supported Sitting balance-Leahy Scale: Fair Sitting balance - Comments: pt with posterior lean   Standing balance support: Bilateral upper extremity supported;During functional activity Standing balance-Leahy Scale: Zero Standing balance comment: Pt must have outside assist to remain standing.                            ADL Overall ADL's : At baseline                                       General ADL Comments: Pt is fairly dependent with adls and has been for some time now.  Only grooms himself and feeds self.     Vision Vision Assessment?: No apparent visual deficits   Perception Perception Perception Tested?: No   Praxis Praxis Praxis tested?: Within functional limits    Pertinent Vitals/Pain Pain Assessment: Faces Faces Pain Scale: Hurts little more Pain Location: B knees when flexed to attempt to stand. Pain Descriptors / Indicators: Aching;Grimacing Pain Intervention(s): Limited activity within patient's tolerance;Monitored during session;Repositioned     Hand Dominance Right   Extremity/Trunk Assessment Upper Extremity Assessment Upper Extremity Assessment: Generalized weakness   Lower Extremity Assessment Lower Extremity Assessment: Defer to PT evaluation   Cervical / Trunk Assessment Cervical / Trunk Assessment: Kyphotic   Communication Communication Communication: HOH   Cognition Arousal/Alertness: Awake/alert Behavior During Therapy: WFL for tasks assessed/performed Overall Cognitive  Status: History of cognitive impairments - at baseline       Memory: Decreased recall of precautions;Decreased short-term memory             General Comments       Exercises       Shoulder Instructions      Home Living Family/patient expects to be discharged to:: Private residence Living Arrangements: Children Available Help at Discharge: Family;Available 24  hours/day;Personal care attendant Type of Home: House Home Access: Stairs to enter [employer] of Steps: 3   Home Layout: One level     Bathroom Shower/Tub: Other (comment) (pt sponge bathes.)   Bathroom Toilet: Standard Bathroom Accessibility:  (pt uses depends and caregiver changes him)   Home Equipment: Walker - 2 wheels;Wheelchair - manual;Bedside commode          Prior Functioning/Environment Level of Independence: Needs assistance  Gait / Transfers Assistance Needed: walks very short distances with therapy and family with someone following with w/c. ADL's / Homemaking Assistance Needed: dependent for all but feeding and grooming.    Comments: Pt has full time caregivers.  Pt spends a lot of time in bed and has caregivers to assist with toileting, dressing, bathing etc.    OT Diagnosis: Generalized weakness   OT Problem List:     OT Treatment/Interventions:      OT Goals(Current goals can be found in the care plan section) Acute Rehab OT Goals Patient Stated Goal: to go home  OT Frequency:     Barriers to D/C:            Co-evaluation PT/OT/SLP Co-Evaluation/Treatment: Yes Reason for Co-Treatment: Necessary to address cognition/behavior during functional activity;Complexity of the patient's impairments (multi-system involvement);For patient/therapist safety PT goals addressed during session: Mobility/safety with mobility OT goals addressed during session: ADL's and self-care      End of Session Equipment Utilized During Treatment: Gait belt;Rolling walker;Left knee immobilizer Nurse Communication: Mobility status  Activity Tolerance: Patient tolerated treatment well Patient left: in bed;with call bell/phone within reach;with bed alarm set   Time: 1200-1220 OT Time Calculation (min): 20 min Charges:  OT General Charges $OT Visit: 1 Procedure OT Evaluation $OT Eval Moderate Complexity: 1 Procedure G-Codes:    Glenford Peers 11/26/2015,  12:38 PM  [phone]

## 2015-10-28 NOTE — Progress Notes (Addendum)
Speech Language Pathology Treatment: Dysphagia  Patient Details Name: [name] MRN: [id number] DOB: 02-23-1925 Today's Date: 10/28/2015 Time: [id number] SLP Time Calculation (min) (ACUTE ONLY): 11 min  Assessment / Plan / Recommendation Clinical Impression  Pt was finishing breakfast meal upon SLP arrival. Skilled observation provided with soft solids and thin liquids with intermittent throat clearing noted, suspect related to esophageal component. Vocal quality remained clear. He independently verbalized the need for alternating solids/liquids and taking small bites/sips. Min cues provided during attempts to talk with food still in his mouth. Recommend to continue current diet and strategies.    HPI HPI: [name] is a 82 y.o. male who vomited up black fluid last night X 1 and has been having left-sided pain since middle of the week. Black vomit covered his chest and also went onto his blanket and on the floor and his daughter saw it after it had occurred. Denies epigastric pain. Reports chronic abdominal bloating. Denies melena or hematochezia. Poor PO intake per his daughter who is bedside. He denies ever vomiting blood up before and thinks he had an EGD in the past but is not sure. Reports colonoscopy over 10 years ago but records not available. INR 4.6 on chronic Coumadin. Hgb 9.5. Hemoccult negative. Denies any recent Abx (daughter reports last Abx was in 06/2015). Patient is oriented X 3.  Current chest x-ray showing questionable PNA.        SLP Plan  Continue with current plan of care     Recommendations  Diet recommendations: Dysphagia 3 (mechanical soft);Thin liquid Liquids provided via: Cup;Straw Medication Administration: Whole meds with puree (crush larger ones) Supervision: Patient able to self feed;Intermittent supervision to cue for compensatory strategies Compensations: Minimize environmental distractions;Slow rate;Small sips/bites Postural Changes and/or Swallow  Maneuvers: Seated upright 90 degrees;Upright 30-60 min after meal             Oral Care Recommendations: Oral care BID Follow up Recommendations: Skilled Nursing facility Plan: Continue with current plan of care     GO               Germain Osgood, M.A. CCC-SLP [phone]  Germain Osgood 10/28/2015, 10:11 AM

## 2015-10-28 NOTE — Progress Notes (Signed)
ANTICOAGULATION CONSULT NOTE - Follow Up Consult  Pharmacy Consult for Coumadin Indication: atrial fibrillation and LAA thrombus  Allergies  Allergen Reactions  . Darifenacin Hydrobromide     REACTION: constipation  . Fluoxetine Hcl     REACTION: Very Depressed while taking   Patient Measurements: Height: 6' (182.9 cm) Weight: 175 lb 1.6 oz (79.425 kg) (inputed as Kg instead of Lb) IBW/kg (Calculated) : 77.6  Vital Signs: Temp: 98.1 F (36.7 C) (03/09 0530) Temp Source: Oral (03/09 0530) BP: 146/67 mmHg (03/09 0530) Pulse Rate: 68 (03/09 0530)  Labs:  Recent Labs  10/26/15 0253 10/26/15 1551 10/27/15 0500 10/28/15 0537  HGB 6.9* 8.6* 8.7* 8.9*  HCT 21.4* 26.2* 26.5* 27.3*  PLT 194 204 232  --   LABPROT 23.9*  --  22.7* 21.3*  INR 2.16*  --  2.02* 1.85*  CREATININE 1.00  --  0.89  --     Estimated Creatinine Clearance: 61.8 mL/min (by C-G formula based on Cr of 0.89).  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 Coumadin was held on admission due to coffee ground emesis. GI was consulted and EGD performed- no active bleeding but mild distal esophagitis seen thought to be source of blood.   PO intake looks to be improving. Will continue with conservative doses.  Prior home Coumadin dose: 1.25mg  daily except 2.5mg  on MWF.   Goal of Therapy:  INR 2-3 Monitor platelets by anticoagulation protocol: Yes   Plan:  - Coumadin 1.25mg  po x1 tonight - Daily PT/INR, CBC q72h  Levester Fresh, PharmD, BCPS, [facility] Clinical Pharmacist Pager [phone] 10/28/2015 12:28 PM

## 2015-10-28 NOTE — Discharge Summary (Signed)
Physician Discharge Summary  [name] [id number] DOB: 1925-05-16 DOA: 10/22/2015  PCP: No primary care provider on file.  Admit date: 10/22/2015 Discharge date: 10/28/2015  Time spent: > 30 minutes  Recommendations for Outpatient Follow-up:  1. Follow up with Dr. Jenny Reichmann as scheduled  Discharge Diagnoses:  Principal Problem:   Upper GI bleed Active Problems:   Essential hypertension   AKI (acute kidney injury) ([place])   Chronic systolic CHF (congestive heart failure) (HCC)   Long term (current) use of anticoagulants   GI bleed   Pressure ulcer  Discharge Condition: stable  Diet recommendation: heart healthy  Filed Weights   10/26/15 0500 10/27/15 0606 10/28/15 0530  Weight: 84.4 kg (186 lb 1.1 oz) 79.652 kg (175 lb 9.6 oz) 79.425 kg (175 lb 1.6 oz)    History of present illness:  See H&P, Labs, Consult and Test reports for all details in brief, patient is a 82 y.o. male with PMH significant for Systolic HF Ef 30 to 35 % by ECHO 2014, BPH, chronic foley catheter, A fib on Coumadin, CKD stage III cr range 1.2 to 1.7 who presents with coffee ground emesis  Hospital Course:  GI bleed, Coffee ground emesis - Patient presented with supra-therapeutic INR and evidence of an upper GI bleeding. Coumadin was placed on hold and patient received one unit of FFP with Vitamin K, FFP. Gastroenterology was consulted, evaluated patient and he underwent an EGD on 3/4 with findings of gastritis and esophagitis. He was started on protonix, clinically his bleeding has resolved. His Coumadin was resumed with continued stability in his H&H, and he was tolerating diet without further nausea/vomiting. He will be discharged home on Coumadin as well as BID PPI.  Acute on chronic renal failure, stage III. Prior cr range 1.2 to 1.7. - Suspect related to hypovolemia, hypotension, anemia.Improved with IV fluids, now resumed Lasix. Renal function stable Left side abdominal pain: KUB unremarkable Systolic  HF; appears compensated - His diuretics were initially on hold as he received gentle hydration, then his Lasix was resumed 3/7. Euvolemic on discharge. A fib: continue coumadin. Continue with amiodarone. Rate controlled   Procedures:  EGD ENDOSCOPIC IMPRESSION:Mild distal erosive esophagitis Edematous distal stomach - s/p biopsies Small hiatal hernia Suspect bleeding due to esophagitis with elevated INR   Consultations:  GI  Discharge Exam: Filed Vitals:   10/27/15 0606 10/27/15 1255 10/27/15 2136 10/28/15 0530  BP: 139/69 119/51 133/60 146/67  Pulse: 74 70 66 68  Temp: 98.2 F (36.8 C) 97.7 F (36.5 C) 98 F (36.7 C) 98.1 F (36.7 C)  TempSrc:  Oral Oral Oral  Resp: 18 18 18 18   Height:      Weight: 79.652 kg (175 lb 9.6 oz)   79.425 kg (175 lb 1.6 oz)  SpO2: 96% 97% 98% 95%    General: NAD Cardiovascular: irregular Respiratory: CTA biL  Discharge Instructions Activity:  As tolerated   Get Medicines reviewed and adjusted: Please take all your medications with you for your next visit with your Primary MD  Please request your Primary MD to go over all hospital tests and procedure/radiological results at the follow up, please ask your Primary MD to get all Hospital records sent to his/her office.  If you experience worsening of your admission symptoms, develop shortness of breath, life threatening emergency, suicidal or homicidal thoughts you must seek medical attention immediately by calling 911 or calling your MD immediately if symptoms less severe.  You must read complete instructions/literature along  with all the possible adverse reactions/side effects for all the Medicines you take and that have been prescribed to you. Take any new Medicines after you have completely understood and accpet all the possible adverse reactions/side effects.   Do not drive when taking Pain medications.   Do not take more than prescribed Pain, Sleep and Anxiety  Medications  Special Instructions: If you have smoked or chewed Tobacco in the last 2 yrs please stop smoking, stop any regular Alcohol and or any Recreational drug use.  Wear Seat belts while driving.  Please note  You were cared for by a hospitalist during your hospital stay. Once you are discharged, your primary care physician will handle any further medical issues. Please note that NO REFILLS for any discharge medications will be authorized once you are discharged, as it is imperative that you return to your primary care physician (or establish a relationship with a primary care physician if you do not have one) for your aftercare needs so that they can reassess your need for medications and monitor your lab values.    Medication List    STOP taking these medications        cephALEXin 500 MG capsule  Commonly known as:  KEFLEX     sulfamethoxazole-trimethoprim 800-160 MG tablet  Commonly known as:  BACTRIM DS,SEPTRA DS      TAKE these medications        acetaminophen 325 MG tablet  Commonly known as:  TYLENOL  Take 650 mg by mouth every 6 (six) hours as needed for moderate pain (pain).     ALIGN 4 MG Caps  Take 1 capsule by mouth daily.     amiodarone 200 MG tablet  Commonly known as:  PACERONE  Take 0.5 tablets (100 mg total) by mouth daily.     buPROPion 75 MG tablet  Commonly known as:  WELLBUTRIN  Take 1 tablet (75 mg total) by mouth 2 (two) times daily.     ferrous sulfate 325 (65 FE) MG tablet  Take 325 mg by mouth at bedtime.     finasteride 5 MG tablet  Commonly known as:  PROSCAR  Take 1 tablet (5 mg total) by mouth daily.     furosemide 40 MG tablet  Commonly known as:  LASIX  TAKE 1 TABLET (40 MG TOTAL) BY MOUTH EVERY OTHER DAY.     l-methylfolate-B6-B12 3-35-2 MG Tabs tablet  Commonly known as:  METANX  TAKE 1 TABLET BY MOUTH 2 (TWO) TIMES DAILY.     levofloxacin 500 MG tablet  Commonly known as:  LEVAQUIN  Take 1 tablet (500 mg total) by  mouth daily.     lisinopril 5 MG tablet  Commonly known as:  PRINIVIL,ZESTRIL  TAKE 1 TABLET (5 MG TOTAL) BY MOUTH DAILY.     multivitamin tablet  Take 1 tablet by mouth daily.     MYRBETRIQ 25 MG Tb24 tablet  Generic drug:  mirabegron ER  Take 25 mg by mouth daily.     pantoprazole 40 MG tablet  Commonly known as:  PROTONIX  Take 1 tablet (40 mg total) by mouth 2 (two) times daily before a meal. After one month take 1 tablet daily.     phenazopyridine 100 MG tablet  Commonly known as:  PYRIDIUM  Take 100 mg by mouth 3 (three) times daily as needed for pain (bladder spasms).     pravastatin 80 MG tablet  Commonly known as:  PRAVACHOL  TAKE 1 TABLET (80  MG TOTAL) BY MOUTH DAILY.     URIBEL PO  Take 1 capsule by mouth daily as needed (bladder spasm).     warfarin 2.5 MG tablet  Commonly known as:  COUMADIN  Take 1.25-2.5 mg by mouth daily. Take 1.25mg  on Sunday,Tuesdays, and Thursdays. Then take 2.5mg  on Monday, Wednesdays, Fridays and Saturdays per daughter     warfarin 2.5 MG tablet  Commonly known as:  COUMADIN  TAKE AS DIRECTED BY ANTICOAGULATION CLINIC           Follow-up Information    Follow up with Unice Cobble, MD. Schedule an appointment as soon as possible for a visit on 11/03/2015.   Specialty:  Internal Medicine   Why:  Appointment will be with Dr. Cathlean Cower on 11/03/15 at 3pm please make sure you make your appointment   Contact information:   [address] [address] [phone]       Follow up with [facility]   Why:  RN HHA PT OT SW. Will call in next 1-2 days to set up first appointment   Contact information:   [address] [place] [address] [phone]       Follow up with [facility].   Why:  Hospital bed. To bed delivered to home Friday.   Contact information:   [address] [place] [address] [phone]       The results of significant diagnostics from this  hospitalization (including imaging, microbiology, ancillary and laboratory) are listed below for reference.    Significant Diagnostic Studies: Dg Chest Port 1 View  10/26/2015  CLINICAL DATA:  CHF.  GI bleed. EXAM: PORTABLE CHEST 1 VIEW COMPARISON:  10/22/2015 chest radiograph. FINDINGS: Stable cardiomediastinal silhouette with mild cardiomegaly. No pneumothorax. No pleural effusion. Low lung volumes. Mild pulmonary edema. Curvilinear opacities at the left lung base, favor atelectasis. IMPRESSION: 1. Mild cardiomegaly and mild pulmonary edema, in keeping with mild congestive heart failure. 2. Curvilinear left basilar lung opacities, favor atelectasis. Electronically Signed   By: Ilona Sorrel M.D.   On: 10/26/2015 08:19   Dg Chest Port 1 View  10/22/2015  CLINICAL DATA:  82 year old male with left-sided chest pain x1 day. Leukocytosis. EXAM: PORTABLE CHEST 1 VIEW COMPARISON:  Radiograph dated 12/06/2012 FINDINGS: Single portable view of the chest demonstrate mild increased interstitial and vascular prominence likely minimal congestive changes. An area of apparent increased density at the left lung base over the cardiac silhouette may represent mild atelectatic changes. Pneumonia is not excluded. Clinical correlation is recommended. There is no pleural effusion, or pneumothorax. The cardiac silhouette is within normal limits. No acute osseous pathology. IMPRESSION: Mild congestive changes. Faint area of increased density at the left lung base may represent atelectatic changes. Pneumonia is not excluded. Clinical correlation and follow-up recommended. Electronically Signed   By: Anner Crete M.D.   On: 10/22/2015 18:15   Dg Abd Portable 1v  10/22/2015  CLINICAL DATA:  Abdominal distention for 1 day EXAM: PORTABLE ABDOMEN - 1 VIEW COMPARISON:  None. FINDINGS: There is no bowel dilation to suggest obstruction or significant adynamic ileus. Soft tissues are unremarkable.  Bony structures are demineralized.  IMPRESSION: 1. No acute finding. No evidence of bowel obstruction or significant adynamic ileus. Electronically Signed   By: Lajean Manes M.D.   On: 10/22/2015 17:52   Dg Swallowing Func-speech Pathology  10/25/2015  Objective Swallowing Evaluation: Type of Study: MBS-Modified Barium Swallow Study Patient Details  Name: [name] MRN: [id number] Date of Birth: Sep 30, 1924 Today's Date: 10/25/2015 Time: SLP Start Time (ACUTE ONLY): 1030-SLP Stop Time (ACUTE ONLY): 1100 SLP Time Calculation (min) (ACUTE ONLY): 30 min Past Medical History: Past Medical History Diagnosis Date . Anemia    nos . History of colonic polyps  . Hyperlipidemia  . Hypertension  . Cancer ([place]) [id number]   hx of basal cell . BPH (benign prostatic hyperplasia)  . Anxiety attack    panic  . Pre-syncope    echo 03/2010,with EF 60%,mild RV dilation,no significant abnormalities...event monitor for 3wks/episodes os sinus bradycardia with heart rate to low 40's occasionally at night(suspect while sleeping) . Atrial fibrillation ([place])    a. Dx 11/2012 - TEE with possible LAA thrombus, started on amio/coumadin. . Systolic CHF ([place])    a. EF 25-30% 11/2012 ?tachy-mediated. b. Not on ACEI at dc due to AKI, can consider as OP. [name] CAD (coronary artery disease)    a. Mod CAD by cath 12/10/12 - 60% long proximal LAD, 60% prox PDA. [name] AKI (acute kidney injury) ([place])    a. Peak Cr 4.17 11/2012 - felt to be post-obstructive. . Urinary retention    a. Post obstructive, dc'd with foley 12/2012. [name] Renal cyst    a. By renal US 11/2012, not candidate for further eval. . Thrombus of left atrial appendage    a. Possible LAA thrombus by TEE 11/2012. Past Surgical History: Past Surgical History Procedure Laterality Date . Colonoscopy w/ polypectomy  2007 . Cholecystectomy   . Total knee arthroplasty   . Mohs surgery  2010 . Tee without cardioversion N/A 12/11/2012   Procedure: TRANSESOPHAGEAL ECHOCARDIOGRAM (TEE);  Surgeon: Thayer Headings, MD;  Location: [facility];  Service:  Cardiovascular;  Laterality: N/A;  ja/bev. [place] Dates without cardioversion N/A 01/21/2013   Procedure: TRANSESOPHAGEAL ECHOCARDIOGRAM (TEE);  Surgeon: Larey Dresser, MD;  Location: [facility];  Service: Cardiovascular;  Laterality: N/A; . Cardioversion N/A 01/21/2013   Procedure: CARDIOVERSION;  Surgeon: Larey Dresser, MD;  Location: [facility];  Service: Cardiovascular;  Laterality: N/A; . Left heart catheterization with coronary angiogram N/A 12/10/2012   Procedure: LEFT HEART CATHETERIZATION WITH CORONARY ANGIOGRAM;  Surgeon: Larey Dresser, MD;  Location[facility] C[facility] LAB;  Service: Cardiovascular;  Laterality: N/A; HPI: [name] is a 82 y.o. male who vomited up black fluid last night X 1 and has been having left-sided pain since middle of the week. Black vomit covered his chest and also went onto his blanket and on the floor and his daughter saw it after it had occurred. Denies epigastric pain. Reports chronic abdominal bloating. Denies melena or hematochezia. Poor PO intake per his daughter who is bedside. He denies ever vomiting blood up before and thinks he had an EGD in the past but is not sure. Reports colonoscopy over 10 years ago but records not available. INR 4.6 on chronic Coumadin. Hgb 9.5. Hemoccult negative. Denies any recent Abx (daughter reports last Abx was in 06/2015). Patient is oriented X 3.  Current chest x-ray showing questionable PNA.   Subjective: The patient was seen sitting upright in bed just having eaten breakfast and taken his medications whole in puree.  Assessment / Plan / Recommendation CHL IP CLINICAL IMPRESSIONS 10/25/2015 Therapy Diagnosis Mild pharyngeal phase dysphagia;Suspected primary esophageal dysphagia Clinical Impression Pt demonstrates mild deficits leading to trace residuals post swallow, though no gross weakness. There is mild premature spill over the base of tongue, but otherwise there was no delay  in swallow response. Pt did silently aspirate puree once during  esophageal sweep, so event was not captured. Suspect pt was talking while puree was at base of tongue, leading to aspiration before the swallow. subsequent trials were normal. Of concern was that barium tablet would not pass at the GE junction and corkscrew-like spasms were seen at the distal esophagus (no radiologist present to confirm). GI could review these images if needed. Recommend a dys 3 diet with thin liquids with moderate risk of aspiration given positioning, suspected esophageal dysphagia and likely poor compliance with suggestions to avoid talking while eating. SLP will f/u to reinforce.  Impact on safety and function Moderate aspiration risk   CHL IP TREATMENT RECOMMENDATION 10/25/2015 Treatment Recommendations Therapy as outlined in treatment plan below   Prognosis 10/25/2015 Prognosis for Safe Diet Advancement Fair Barriers to Reach Goals -- Barriers/Prognosis Comment -- CHL IP DIET RECOMMENDATION 10/25/2015 SLP Diet Recommendations Dysphagia 3 (Mech soft) solids;Thin liquid Liquid Administration via Cup;Straw Medication Administration Whole meds with puree Compensations Minimize environmental distractions;Slow rate;Small sips/bites Postural Changes Seated upright at 90 degrees;Remain semi-upright after after feeds/meals (Comment)   CHL IP OTHER RECOMMENDATIONS 10/25/2015 Recommended Consults -- Oral Care Recommendations Oral care BID Other Recommendations --   CHL IP FOLLOW UP RECOMMENDATIONS 10/25/2015 Follow up Recommendations Skilled Nursing facility   [facility] IP FREQUENCY AND DURATION 10/25/2015 Speech Therapy Frequency (ACUTE ONLY) min 2x/week Treatment Duration 2 weeks      CHL IP ORAL PHASE 10/25/2015 Oral Phase Impaired Oral - Pudding Teaspoon -- Oral - Pudding Cup -- Oral - Honey Teaspoon -- Oral - Honey Cup -- Oral - Nectar Teaspoon -- Oral - Nectar Cup -- Oral - Nectar Straw -- Oral - Thin Teaspoon -- Oral - Thin Cup -- Oral - Thin Straw -- Oral - Puree -- Oral - Mech Soft -- Oral - Regular -- Oral -  Multi-Consistency -- Oral - Pill -- Oral Phase - Comment mild premature spill to valleculae  CHL IP PHARYNGEAL PHASE 10/25/2015 Pharyngeal Phase Impaired Pharyngeal- Pudding Teaspoon -- Pharyngeal -- Pharyngeal- Pudding Cup -- Pharyngeal -- Pharyngeal- Honey Teaspoon -- Pharyngeal -- Pharyngeal- Honey Cup -- Pharyngeal -- Pharyngeal- Nectar Teaspoon -- Pharyngeal -- Pharyngeal- Nectar Cup -- Pharyngeal -- Pharyngeal- Nectar Straw -- Pharyngeal -- Pharyngeal- Thin Teaspoon -- Pharyngeal -- Pharyngeal- Thin Cup Pharyngeal residue - valleculae;Pharyngeal residue - pyriform Pharyngeal -- Pharyngeal- Thin Straw Pharyngeal residue - valleculae;Pharyngeal residue - pyriform Pharyngeal -- Pharyngeal- Puree Pharyngeal residue - valleculae;Penetration/Apiration after swallow Pharyngeal -- Pharyngeal- Mechanical Soft Pharyngeal residue - valleculae;Pharyngeal residue - pyriform Pharyngeal -- Pharyngeal- Regular -- Pharyngeal -- Pharyngeal- Multi-consistency -- Pharyngeal -- Pharyngeal- Pill -- Pharyngeal -- Pharyngeal Comment --  No flowsheet data found. CHL IP GO 10/24/2015 Functional Assessment Tool Used ASHA NOMS and clinical judgment.   Functional Limitations Swallowing Swallow Current Status [phone]) CJ Swallow Goal Status [id number]) [place] Swallow Discharge Status [phone]) (None) Motor Speech Current Status [phone]) (None) Motor Speech Goal Status [phone]) (None) Motor Speech Goal Status [phone]) (None) Spoken Language Comprehension Current Status [phone]) (None) Spoken Language Comprehension Goal Status [id number]) (None) Spoken Language Comprehension Discharge Status [phone]) (None) Spoken Language Expression Current Status [phone]) (None) Spoken Language Expression Goal Status [id number]) (None) Spoken Language Expression Discharge Status [phone]) (None) Attention Current Status [id number]) (None) Attention Goal Status [id number]) (None) Attention Discharge Status [id number]) (None) Memory Current Status [id number]) (None) Memory Goal Status [id number]) (None)  Memory Discharge Status [id number]) (None) Voice Current Status [id number]) (None) Voice Goal Status [id number]) (None) Voice Discharge  Status [phone]) (None) Other Speech-Language Pathology Functional Limitation [phone]) (None) Other Speech-Language Pathology Functional Limitation Goal Status [phone]) (None) Other Speech-Language Pathology Functional Limitation Discharge Status [phone]) (None) Herbie Baltimore, MA CCC-SLP [phone] [place], Katherene Ponto 10/25/2015, 11:31 AM               Microbiology: Recent Results (from the past 240 hour(s))  Urine culture     Status: None   Collection Time: 10/22/15  7:11 PM  Result Value Ref Range Status   Specimen Description URINE, RANDOM  Final   Special Requests NONE  Final   Culture MULTIPLE SPECIES PRESENT, SUGGEST RECOLLECTION  Final   Report Status 10/24/2015 FINAL  Final  Culture, blood (routine x 2) Call MD if unable to obtain prior to antibiotics being given     Status: None   Collection Time: 10/23/15  8:23 AM  Result Value Ref Range Status   Specimen Description BLOOD LEFT ANTECUBITAL  Final   Special Requests BOTTLES DRAWN AEROBIC AND ANAEROBIC 5MLS  Final   Culture NO GROWTH 5 DAYS  Final   Report Status 10/28/2015 FINAL  Final  Culture, blood (routine x 2) Call MD if unable to obtain prior to antibiotics being given     Status: None   Collection Time: 10/23/15  9:12 AM  Result Value Ref Range Status   Specimen Description RIGHT ANTECUBITAL  Final   Special Requests BOTTLES DRAWN AEROBIC ONLY 5CC  Final   Culture NO GROWTH 5 DAYS  Final   Report Status 10/28/2015 FINAL  Final  MRSA PCR Screening     Status: Abnormal   Collection Time: 10/23/15 12:14 PM  Result Value Ref Range Status   MRSA by PCR POSITIVE (A) NEGATIVE Final    Comment:        The GeneXpert MRSA Assay (FDA approved for NASAL specimens only), is one component of a comprehensive MRSA colonization surveillance program. It is not intended to diagnose MRSA infection nor to guide  or monitor treatment for MRSA infections. RESULT CALLED TO, READ BACK BY AND VERIFIED WITH: RN T HARVEY AT [phone] AB:[id number] MARTINB      Labs: Basic Metabolic Panel:  Recent Labs Lab 10/22/15 1424 10/23/15 0155 10/24/15 0100 10/25/15 0229 10/26/15 0253 10/27/15 0500  NA 135 137 141 141 142 142  K 3.9 3.9 2.9* 3.6 3.3* 3.2*  CL 102 103 105 110 109 108  CO2 20* 21* 24 22 22 26   GLUCOSE 118* 101* 120* 110* 104* 97  BUN 45* 43* 34* 21* 14 14  CREATININE 3.41* 2.68* 1.52* 1.07 1.00 0.89  CALCIUM 8.7* 8.7* 8.5* 8.1* 8.2* 8.1*   Liver Function Tests:  Recent Labs Lab 10/22/15 1424 10/24/15 0100  AST 39 57*  ALT 29 30  ALKPHOS 117 94  BILITOT 0.6 0.5  PROT 6.9 5.5*  ALBUMIN 2.5* 2.1*   No results for input(s): LIPASE, AMYLASE in the last 168 hours. No results for input(s): AMMONIA in the last 168 hours. CBC:  Recent Labs Lab 10/24/15 0100 10/25/15 0229 10/26/15 0253 10/26/15 1551 10/27/15 0500 10/28/15 0537  WBC 12.4* 10.3 9.7 11.1* 12.1*  --   HGB 7.5* 7.4* 6.9* 8.6* 8.7* 8.9*  HCT 23.9* 23.8* 21.4* 26.2* 26.5* 27.3*  MCV 88.2 89.1 88.8 88.5 87.7  --   PLT 173 193 194 204 232  --    Cardiac Enzymes: No results for input(s): CKTOTAL, CKMB, CKMBINDEX, TROPONINI in the last 168 hours. BNP: BNP (last 3 results) No results for input(s):  BNP in the last 8760 hours.  ProBNP (last 3 results) No results for input(s): PROBNP in the last 8760 hours.  CBG:  Recent Labs Lab 10/23/15 1236  GLUCAP 105*       Signed:  GHERGHE, COSTIN  [facility] 10/28/2015, 5:27 PM

## 2015-10-28 NOTE — Evaluation (Addendum)
Physical Therapy Evaluation Patient Details Name: [name] MRN: [id number] DOB: 02/15/1925 Today's Date: 10/28/2015   History of Present Illness  Pt is an 82 yo male admitted for GI bleed after having coffee ground emisis. Pt with acute on chronic renal failure. 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  Clinical Impression  Pt presents to PT close to baseline for mobility. Agree with return home with 24 hour caregivers.    Follow Up Recommendations Home health PT (resume)    Equipment Recommendations  None recommended by PT    Recommendations for Other Services       Precautions / Restrictions Precautions Precautions: Fall Required Braces or Orthoses: Other Brace/Splint (lt knee support, orthopedic shoes) Knee Immobilizer - Left: On at all times Other Brace/Splint: orthopedic built up shoes Restrictions Weight Bearing Restrictions: No      Mobility  Bed Mobility Overal bed mobility: Needs Assistance Bed Mobility: Supine to Sit;Sit to Supine     Supine to sit: Mod assist;HOB elevated Sit to supine: +2 for physical assistance;Max assist   General bed mobility comments: Pt very rigid at baseline.  Has caregivers that assist him at home with all mobility.  Transfers Overall transfer level: Needs assistance Equipment used: Rolling walker (2 wheeled);2 person hand held assist Transfers: Sit to/from Stand Sit to Stand: +2 physical assistance;Max assist         General transfer comment: Pt with heavy posterior lean due to stiffness in B knees. Once he is up on his feet with assist +2 he can bring feet underneath him and stand with mod assist.  Ambulation/Gait                Stairs            Wheelchair Mobility    Modified Rankin (Stroke Patients Only)       Balance Overall balance assessment: Needs assistance Sitting-balance support: Feet supported Sitting balance-Leahy Scale: Fair Sitting balance - Comments: pt with posterior lean    Standing balance support: Bilateral upper extremity supported Standing balance-Leahy Scale: Zero Standing balance comment: Walker and 2 person assist                             Pertinent Vitals/Pain Pain Assessment: Faces Faces Pain Scale: Hurts little more Pain Location: Bil knees with flex Pain Descriptors / Indicators: Grimacing Pain Intervention(s): Limited activity within patient's tolerance;Monitored during session;Repositioned    Home Living Family/patient expects to be discharged to:: Private residence Living Arrangements: Children Available Help at Discharge: Family;Available 24 hours/day;Personal care attendant Type of Home: House Home Access: Stairs to enter   [employer] of Steps: 3 Home Layout: One level Home Equipment: Walker - 2 wheels;Wheelchair - manual;Bedside commode      Prior Function Level of Independence: Needs assistance   Gait / Transfers Assistance Needed: walks very short distances with therapy and family with someone following with w/c.  ADL's / Homemaking Assistance Needed: dependent for all but feeding and grooming.   Comments: Pt has full time caregivers.  Pt spends a lot of time in bed and has caregivers to assist with toileting, dressing, bathing etc.     Hand Dominance   Dominant Hand: Right    Extremity/Trunk Assessment   Upper Extremity Assessment: Defer to OT evaluation           Lower Extremity Assessment: RLE deficits/detail;Generalized weakness;LLE deficits/detail RLE Deficits / Details: Limited knee flex, Strength >3/5  LLE Deficits / Details: Limited knee flex, Strength >3/5  Cervical / Trunk Assessment: Kyphotic  Communication   Communication: HOH  Cognition Arousal/Alertness: Awake/alert Behavior During Therapy: WFL for tasks assessed/performed Overall Cognitive Status: History of cognitive impairments - at baseline       Memory: Decreased recall of precautions;Decreased short-term memory               General Comments General comments (skin integrity, edema, etc.): Pt appears to be close to baseline with adls.    Exercises        Assessment/Plan    PT Assessment All further PT needs can be met in the next venue of care  PT Diagnosis Difficulty walking;Generalized weakness;Abnormality of gait   PT Problem List Decreased strength;Decreased range of motion;Decreased balance;Decreased mobility  PT Treatment Interventions     PT Goals (Current goals can be found in the Care Plan section) Acute Rehab PT Goals Patient Stated Goal: to go home    Frequency     Barriers to discharge        Co-evaluation   Reason for Co-Treatment: Necessary to address cognition/behavior during functional activity;Complexity of the patient's impairments (multi-system involvement);For patient/therapist safety PT goals addressed during session: Mobility/safety with mobility OT goals addressed during session: ADL's and self-care       End of Session Equipment Utilized During Treatment: Gait belt Activity Tolerance: Patient tolerated treatment well Patient left: in bed;with call bell/phone within reach;with bed alarm set Nurse Communication: Mobility status         Time: 1210-1225 PT Time Calculation (min) (ACUTE ONLY): 15 min   Charges:   PT Evaluation $PT Eval Moderate Complexity: 1 Procedure     PT G Codes:        Josely Moffat 21-Nov-2015, 3:49 PM [facility] PT [phone]

## 2015-11-03 ENCOUNTER — Ambulatory Visit (INDEPENDENT_AMBULATORY_CARE_PROVIDER_SITE_OTHER): Payer: Medicare Other | Admitting: Interventional Cardiology

## 2015-11-03 ENCOUNTER — Inpatient Hospital Stay: Payer: Medicare Other | Admitting: Internal Medicine

## 2015-11-03 DIAGNOSIS — I4891 Unspecified atrial fibrillation: Secondary | ICD-10-CM

## 2015-11-03 DIAGNOSIS — Z7901 Long term (current) use of anticoagulants: Secondary | ICD-10-CM

## 2015-11-03 LAB — POCT INR: INR: 2.2

## 2015-11-10 ENCOUNTER — Telehealth: Payer: Self-pay | Admitting: Cardiology

## 2015-11-10 ENCOUNTER — Ambulatory Visit (INDEPENDENT_AMBULATORY_CARE_PROVIDER_SITE_OTHER): Payer: Medicare Other | Admitting: Cardiovascular Disease

## 2015-11-10 DIAGNOSIS — Z7901 Long term (current) use of anticoagulants: Secondary | ICD-10-CM

## 2015-11-10 DIAGNOSIS — I4891 Unspecified atrial fibrillation: Secondary | ICD-10-CM

## 2015-11-10 LAB — POCT INR: INR: 3

## 2015-11-10 NOTE — Telephone Encounter (Signed)
°  New Prob   Calling in to report PT-INR results. Please call.

## 2015-11-10 NOTE — Telephone Encounter (Signed)
Telephoned Home Health nurse back, see anticoagulation encounter.

## 2015-11-18 ENCOUNTER — Ambulatory Visit (INDEPENDENT_AMBULATORY_CARE_PROVIDER_SITE_OTHER): Payer: Medicare Other | Admitting: Cardiovascular Disease

## 2015-11-18 DIAGNOSIS — I4891 Unspecified atrial fibrillation: Secondary | ICD-10-CM

## 2015-11-18 DIAGNOSIS — Z7901 Long term (current) use of anticoagulants: Secondary | ICD-10-CM

## 2015-11-18 LAB — POCT INR: INR: 2.5

## 2015-12-02 ENCOUNTER — Ambulatory Visit (INDEPENDENT_AMBULATORY_CARE_PROVIDER_SITE_OTHER): Payer: Medicare Other | Admitting: Internal Medicine

## 2015-12-02 DIAGNOSIS — I4891 Unspecified atrial fibrillation: Secondary | ICD-10-CM

## 2015-12-02 DIAGNOSIS — Z7901 Long term (current) use of anticoagulants: Secondary | ICD-10-CM

## 2015-12-02 LAB — POCT INR: INR: 1.8

## 2015-12-16 ENCOUNTER — Ambulatory Visit (INDEPENDENT_AMBULATORY_CARE_PROVIDER_SITE_OTHER): Payer: Medicare Other | Admitting: Cardiovascular Disease

## 2015-12-16 DIAGNOSIS — I4891 Unspecified atrial fibrillation: Secondary | ICD-10-CM

## 2015-12-16 DIAGNOSIS — Z7901 Long term (current) use of anticoagulants: Secondary | ICD-10-CM

## 2015-12-16 LAB — POCT INR: INR: 3

## 2015-12-28 ENCOUNTER — Other Ambulatory Visit: Payer: Self-pay | Admitting: Cardiology

## 2015-12-30 ENCOUNTER — Ambulatory Visit (INDEPENDENT_AMBULATORY_CARE_PROVIDER_SITE_OTHER): Payer: Medicare Other | Admitting: Cardiovascular Disease

## 2015-12-30 DIAGNOSIS — Z7901 Long term (current) use of anticoagulants: Secondary | ICD-10-CM

## 2015-12-30 DIAGNOSIS — I4891 Unspecified atrial fibrillation: Secondary | ICD-10-CM

## 2015-12-30 LAB — POCT INR: INR: 4.4

## 2016-01-07 ENCOUNTER — Ambulatory Visit (INDEPENDENT_AMBULATORY_CARE_PROVIDER_SITE_OTHER): Payer: Medicare Other | Admitting: Interventional Cardiology

## 2016-01-07 DIAGNOSIS — Z7901 Long term (current) use of anticoagulants: Secondary | ICD-10-CM

## 2016-01-07 DIAGNOSIS — I4891 Unspecified atrial fibrillation: Secondary | ICD-10-CM

## 2016-01-07 LAB — POCT INR: INR: 2.4

## 2016-01-13 ENCOUNTER — Ambulatory Visit (INDEPENDENT_AMBULATORY_CARE_PROVIDER_SITE_OTHER): Payer: Medicare Other | Admitting: Pharmacist

## 2016-01-13 DIAGNOSIS — I4891 Unspecified atrial fibrillation: Secondary | ICD-10-CM

## 2016-01-13 DIAGNOSIS — Z7901 Long term (current) use of anticoagulants: Secondary | ICD-10-CM

## 2016-01-13 LAB — POCT INR: INR: 2.7

## 2016-01-14 ENCOUNTER — Inpatient Hospital Stay (HOSPITAL_COMMUNITY)
Admission: EM | Admit: 2016-01-14 | Discharge: 2016-01-25 | DRG: 872 | Disposition: A | Discharge status: Home Health | Payer: Medicare Other | Attending: Internal Medicine | Admitting: Internal Medicine

## 2016-01-14 ENCOUNTER — Encounter (HOSPITAL_COMMUNITY): Payer: Self-pay

## 2016-01-14 ENCOUNTER — Emergency Department (HOSPITAL_COMMUNITY): Payer: Medicare Other

## 2016-01-14 DIAGNOSIS — C187 Malignant neoplasm of sigmoid colon: Secondary | ICD-10-CM | POA: Diagnosis present

## 2016-01-14 DIAGNOSIS — Z8249 Family history of ischemic heart disease and other diseases of the circulatory system: Secondary | ICD-10-CM | POA: Diagnosis not present

## 2016-01-14 DIAGNOSIS — D62 Acute posthemorrhagic anemia: Secondary | ICD-10-CM | POA: Diagnosis present

## 2016-01-14 DIAGNOSIS — T8383XA Hemorrhage of genitourinary prosthetic devices, implants and grafts, initial encounter: Secondary | ICD-10-CM | POA: Diagnosis present

## 2016-01-14 DIAGNOSIS — C787 Secondary malignant neoplasm of liver and intrahepatic bile duct: Secondary | ICD-10-CM | POA: Diagnosis present

## 2016-01-14 DIAGNOSIS — I5022 Chronic systolic (congestive) heart failure: Secondary | ICD-10-CM | POA: Diagnosis present

## 2016-01-14 DIAGNOSIS — H9190 Unspecified hearing loss, unspecified ear: Secondary | ICD-10-CM | POA: Diagnosis present

## 2016-01-14 DIAGNOSIS — Z823 Family history of stroke: Secondary | ICD-10-CM

## 2016-01-14 DIAGNOSIS — M25561 Pain in right knee: Secondary | ICD-10-CM | POA: Diagnosis not present

## 2016-01-14 DIAGNOSIS — R29898 Other symptoms and signs involving the musculoskeletal system: Secondary | ICD-10-CM | POA: Insufficient documentation

## 2016-01-14 DIAGNOSIS — Z86010 Personal history of colonic polyps: Secondary | ICD-10-CM

## 2016-01-14 DIAGNOSIS — S3790XA Unspecified injury of unspecified urinary and pelvic organ, initial encounter: Secondary | ICD-10-CM | POA: Diagnosis present

## 2016-01-14 DIAGNOSIS — Z515 Encounter for palliative care: Secondary | ICD-10-CM | POA: Insufficient documentation

## 2016-01-14 DIAGNOSIS — I482 Chronic atrial fibrillation, unspecified: Secondary | ICD-10-CM | POA: Diagnosis present

## 2016-01-14 DIAGNOSIS — Z9049 Acquired absence of other specified parts of digestive tract: Secondary | ICD-10-CM

## 2016-01-14 DIAGNOSIS — Y658 Other specified misadventures during surgical and medical care: Secondary | ICD-10-CM | POA: Diagnosis present

## 2016-01-14 DIAGNOSIS — I2510 Atherosclerotic heart disease of native coronary artery without angina pectoris: Secondary | ICD-10-CM | POA: Diagnosis present

## 2016-01-14 DIAGNOSIS — E785 Hyperlipidemia, unspecified: Secondary | ICD-10-CM | POA: Diagnosis present

## 2016-01-14 DIAGNOSIS — N390 Urinary tract infection, site not specified: Secondary | ICD-10-CM | POA: Diagnosis present

## 2016-01-14 DIAGNOSIS — Z888 Allergy status to other drugs, medicaments and biological substances status: Secondary | ICD-10-CM

## 2016-01-14 DIAGNOSIS — M21371 Foot drop, right foot: Secondary | ICD-10-CM | POA: Insufficient documentation

## 2016-01-14 DIAGNOSIS — N179 Acute kidney failure, unspecified: Secondary | ICD-10-CM | POA: Diagnosis present

## 2016-01-14 DIAGNOSIS — N138 Other obstructive and reflux uropathy: Secondary | ICD-10-CM | POA: Diagnosis present

## 2016-01-14 DIAGNOSIS — K5909 Other constipation: Secondary | ICD-10-CM | POA: Diagnosis present

## 2016-01-14 DIAGNOSIS — N401 Enlarged prostate with lower urinary tract symptoms: Secondary | ICD-10-CM | POA: Diagnosis present

## 2016-01-14 DIAGNOSIS — B9562 Methicillin resistant Staphylococcus aureus infection as the cause of diseases classified elsewhere: Secondary | ICD-10-CM | POA: Diagnosis present

## 2016-01-14 DIAGNOSIS — M21372 Foot drop, left foot: Secondary | ICD-10-CM | POA: Diagnosis present

## 2016-01-14 DIAGNOSIS — Z7401 Bed confinement status: Secondary | ICD-10-CM

## 2016-01-14 DIAGNOSIS — Z7189 Other specified counseling: Secondary | ICD-10-CM | POA: Insufficient documentation

## 2016-01-14 DIAGNOSIS — I10 Essential (primary) hypertension: Secondary | ICD-10-CM | POA: Diagnosis present

## 2016-01-14 DIAGNOSIS — Z7901 Long term (current) use of anticoagulants: Secondary | ICD-10-CM

## 2016-01-14 DIAGNOSIS — Z801 Family history of malignant neoplasm of trachea, bronchus and lung: Secondary | ICD-10-CM | POA: Diagnosis not present

## 2016-01-14 DIAGNOSIS — R319 Hematuria, unspecified: Secondary | ICD-10-CM | POA: Diagnosis present

## 2016-01-14 DIAGNOSIS — G3183 Dementia with Lewy bodies: Secondary | ICD-10-CM | POA: Diagnosis present

## 2016-01-14 DIAGNOSIS — Z87891 Personal history of nicotine dependence: Secondary | ICD-10-CM | POA: Diagnosis not present

## 2016-01-14 DIAGNOSIS — A4101 Sepsis due to Methicillin susceptible Staphylococcus aureus: Secondary | ICD-10-CM | POA: Diagnosis present

## 2016-01-14 DIAGNOSIS — F411 Generalized anxiety disorder: Secondary | ICD-10-CM | POA: Diagnosis present

## 2016-01-14 DIAGNOSIS — Z85828 Personal history of other malignant neoplasm of skin: Secondary | ICD-10-CM

## 2016-01-14 DIAGNOSIS — Z66 Do not resuscitate: Secondary | ICD-10-CM | POA: Diagnosis present

## 2016-01-14 DIAGNOSIS — K6389 Other specified diseases of intestine: Secondary | ICD-10-CM

## 2016-01-14 DIAGNOSIS — R509 Fever, unspecified: Secondary | ICD-10-CM | POA: Diagnosis not present

## 2016-01-14 DIAGNOSIS — R627 Adult failure to thrive: Secondary | ICD-10-CM | POA: Diagnosis present

## 2016-01-14 DIAGNOSIS — I110 Hypertensive heart disease with heart failure: Secondary | ICD-10-CM | POA: Diagnosis present

## 2016-01-14 DIAGNOSIS — M25562 Pain in left knee: Secondary | ICD-10-CM | POA: Diagnosis not present

## 2016-01-14 DIAGNOSIS — K5730 Diverticulosis of large intestine without perforation or abscess without bleeding: Secondary | ICD-10-CM | POA: Diagnosis present

## 2016-01-14 DIAGNOSIS — Z79899 Other long term (current) drug therapy: Secondary | ICD-10-CM

## 2016-01-14 DIAGNOSIS — R7881 Bacteremia: Secondary | ICD-10-CM | POA: Diagnosis not present

## 2016-01-14 DIAGNOSIS — F0280 Dementia in other diseases classified elsewhere without behavioral disturbance: Secondary | ICD-10-CM | POA: Insufficient documentation

## 2016-01-14 DIAGNOSIS — R109 Unspecified abdominal pain: Secondary | ICD-10-CM

## 2016-01-14 DIAGNOSIS — E860 Dehydration: Secondary | ICD-10-CM | POA: Diagnosis present

## 2016-01-14 DIAGNOSIS — L8990 Pressure ulcer of unspecified site, unspecified stage: Secondary | ICD-10-CM

## 2016-01-14 DIAGNOSIS — A4902 Methicillin resistant Staphylococcus aureus infection, unspecified site: Secondary | ICD-10-CM | POA: Diagnosis not present

## 2016-01-14 DIAGNOSIS — A419 Sepsis, unspecified organism: Secondary | ICD-10-CM

## 2016-01-14 LAB — CBC
HCT: 32.5 % — ABNORMAL LOW (ref 39.0–52.0)
Hemoglobin: 10.4 g/dL — ABNORMAL LOW (ref 13.0–17.0)
MCH: 29.1 pg (ref 26.0–34.0)
MCHC: 32 g/dL (ref 30.0–36.0)
MCV: 91 fL (ref 78.0–100.0)
Platelets: 277 10*3/uL (ref 150–400)
RBC: 3.57 MIL/uL — ABNORMAL LOW (ref 4.22–5.81)
RDW: 17.1 % — AB (ref 11.5–15.5)
WBC: 29.8 10*3/uL — ABNORMAL HIGH (ref 4.0–10.5)

## 2016-01-14 LAB — CBC WITH DIFFERENTIAL/PLATELET
Basophils Absolute: 0 10*3/uL (ref 0.0–0.1)
Basophils Relative: 0 %
EOS ABS: 0.1 10*3/uL (ref 0.0–0.7)
EOS PCT: 1 %
HCT: 29.7 % — ABNORMAL LOW (ref 39.0–52.0)
Hemoglobin: 9.6 g/dL — ABNORMAL LOW (ref 13.0–17.0)
LYMPHS ABS: 1.1 10*3/uL (ref 0.7–4.0)
LYMPHS PCT: 6 %
MCH: 28.9 pg (ref 26.0–34.0)
MCHC: 32.3 g/dL (ref 30.0–36.0)
MCV: 89.5 fL (ref 78.0–100.0)
MONO ABS: 0.8 10*3/uL (ref 0.1–1.0)
MONOS PCT: 4 %
Neutro Abs: 17.7 10*3/uL — ABNORMAL HIGH (ref 1.7–7.7)
Neutrophils Relative %: 89 %
PLATELETS: 240 10*3/uL (ref 150–400)
RBC: 3.32 MIL/uL — AB (ref 4.22–5.81)
RDW: 16.9 % — AB (ref 11.5–15.5)
WBC: 19.7 10*3/uL — AB (ref 4.0–10.5)

## 2016-01-14 LAB — BASIC METABOLIC PANEL
Anion gap: 8 (ref 5–15)
BUN: 41 mg/dL — AB (ref 6–20)
CALCIUM: 8.8 mg/dL — AB (ref 8.9–10.3)
CO2: 27 mmol/L (ref 22–32)
CREATININE: 1.81 mg/dL — AB (ref 0.61–1.24)
Chloride: 99 mmol/L — ABNORMAL LOW (ref 101–111)
GFR calc non Af Amer: 31 mL/min — ABNORMAL LOW (ref >60)
GFR, EST AFRICAN AMERICAN: 36 mL/min — AB (ref >60)
Glucose, Bld: 127 mg/dL — ABNORMAL HIGH (ref 65–99)
Potassium: 4.5 mmol/L (ref 3.5–5.1)
SODIUM: 134 mmol/L — AB (ref 135–145)

## 2016-01-14 LAB — URINE MICROSCOPIC-ADD ON

## 2016-01-14 LAB — HEPATIC FUNCTION PANEL
ALBUMIN: 3.1 g/dL — AB (ref 3.5–5.0)
ALK PHOS: 141 U/L — AB (ref 38–126)
ALT: 37 U/L (ref 17–63)
AST: 33 U/L (ref 15–41)
Bilirubin, Direct: <0.1 mg/dL — ABNORMAL LOW (ref 0.1–0.5)
TOTAL PROTEIN: 7 g/dL (ref 6.5–8.1)
Total Bilirubin: 0.7 mg/dL (ref 0.3–1.2)

## 2016-01-14 LAB — I-STAT CG4 LACTIC ACID, ED: Lactic Acid, Venous: 2.24 mmol/L (ref 0.5–2.0)

## 2016-01-14 LAB — URINALYSIS, ROUTINE W REFLEX MICROSCOPIC
Bilirubin Urine: NEGATIVE
GLUCOSE, UA: NEGATIVE mg/dL
KETONES UR: NEGATIVE mg/dL
Nitrite: NEGATIVE
PH: 7.5 (ref 5.0–8.0)
PROTEIN: 100 mg/dL — AB
Specific Gravity, Urine: 1.016 (ref 1.005–1.030)

## 2016-01-14 LAB — PROTIME-INR
INR: 2.4 — AB (ref 0.00–1.49)
PROTHROMBIN TIME: 25.9 s — AB (ref 11.6–15.2)

## 2016-01-14 LAB — LIPASE, BLOOD: LIPASE: 20 U/L (ref 11–51)

## 2016-01-14 MED ORDER — AMIODARONE HCL 100 MG PO TABS
100.0000 mg | ORAL_TABLET | Freq: Every day | ORAL | Status: DC
  Administered 2016-01-15 – 2016-01-25 (×11): 100 mg via ORAL
  Filled 2016-01-14 (×10): qty 1

## 2016-01-14 MED ORDER — ONDANSETRON HCL 4 MG PO TABS: 4.0000 mg | ORAL_TABLET | Freq: Four times a day (QID) | ORAL | Status: DC | PRN

## 2016-01-14 MED ORDER — SODIUM CHLORIDE 0.9 % IV BOLUS (SEPSIS)
500.0000 mL | Freq: Once | INTRAVENOUS | Status: AC
  Administered 2016-01-14: 500 mL via INTRAVENOUS

## 2016-01-14 MED ORDER — PANTOPRAZOLE SODIUM 40 MG PO TBEC
40.0000 mg | DELAYED_RELEASE_TABLET | Freq: Two times a day (BID) | ORAL | Status: DC
  Administered 2016-01-15 – 2016-01-25 (×20): 40 mg via ORAL
  Filled 2016-01-14 (×20): qty 1

## 2016-01-14 MED ORDER — ACETAMINOPHEN 650 MG RE SUPP: 650.0000 mg | Freq: Four times a day (QID) | RECTAL | Status: DC | PRN

## 2016-01-14 MED ORDER — DEXTROSE 5 % IV SOLN
1.0000 g | Freq: Once | INTRAVENOUS | Status: AC
  Administered 2016-01-14: 1 g via INTRAVENOUS
  Filled 2016-01-14: qty 10

## 2016-01-14 MED ORDER — SODIUM CHLORIDE 0.9% FLUSH
3.0000 mL | Freq: Two times a day (BID) | INTRAVENOUS | Status: DC
  Administered 2016-01-14 – 2016-01-24 (×10): 3 mL via INTRAVENOUS

## 2016-01-14 MED ORDER — SODIUM CHLORIDE 0.9 % IV SOLN
INTRAVENOUS | Status: DC
  Administered 2016-01-14 – 2016-01-15 (×2): via INTRAVENOUS

## 2016-01-14 MED ORDER — BUPROPION HCL 75 MG PO TABS
75.0000 mg | ORAL_TABLET | Freq: Two times a day (BID) | ORAL | Status: DC
  Administered 2016-01-14 – 2016-01-25 (×21): 75 mg via ORAL
  Filled 2016-01-14 (×23): qty 1

## 2016-01-14 MED ORDER — PRAVASTATIN SODIUM 40 MG PO TABS
80.0000 mg | ORAL_TABLET | Freq: Every day | ORAL | Status: DC
  Administered 2016-01-15 – 2016-01-25 (×10): 80 mg via ORAL
  Filled 2016-01-14 (×10): qty 2

## 2016-01-14 MED ORDER — ONDANSETRON HCL 4 MG/2ML IJ SOLN: 4.0000 mg | Freq: Four times a day (QID) | INTRAMUSCULAR | Status: DC | PRN

## 2016-01-14 MED ORDER — HYDROCODONE-ACETAMINOPHEN 5-325 MG PO TABS
1.0000 | ORAL_TABLET | ORAL | Status: DC | PRN
  Administered 2016-01-14 – 2016-01-25 (×2): 1 via ORAL
  Filled 2016-01-14 (×2): qty 1

## 2016-01-14 MED ORDER — TRAZODONE HCL 50 MG PO TABS
50.0000 mg | ORAL_TABLET | Freq: Every day | ORAL | Status: DC
  Administered 2016-01-14 – 2016-01-24 (×11): 50 mg via ORAL
  Filled 2016-01-14 (×11): qty 1

## 2016-01-14 MED ORDER — DEXTROSE 5 % IV SOLN
1.0000 g | INTRAVENOUS | Status: DC
  Administered 2016-01-15: 1 g via INTRAVENOUS
  Filled 2016-01-14: qty 10

## 2016-01-14 MED ORDER — MIRABEGRON ER 25 MG PO TB24
25.0000 mg | ORAL_TABLET | Freq: Every day | ORAL | Status: DC
  Administered 2016-01-15 – 2016-01-25 (×10): 25 mg via ORAL
  Filled 2016-01-14 (×11): qty 1

## 2016-01-14 MED ORDER — SODIUM CHLORIDE 0.9 % IV SOLN: INTRAVENOUS | Status: DC

## 2016-01-14 MED ORDER — ZOLPIDEM TARTRATE 5 MG PO TABS
2.5000 mg | ORAL_TABLET | Freq: Every evening | ORAL | Status: DC | PRN
Start: 2016-01-14 — End: 2016-01-25

## 2016-01-14 MED ORDER — ACETAMINOPHEN 325 MG PO TABS: 650.0000 mg | ORAL_TABLET | Freq: Four times a day (QID) | ORAL | Status: DC | PRN

## 2016-01-14 NOTE — H&P (Signed)
[name] [id number] DOB: [birth year] DOA: 01/14/2016    PCP: No primary care provider on file.   Outpatient Specialists: [facility], GI Schooler, Urology Alexis Frock Patient coming from: home  Chief Complaint: hematuria  HPI: [name] is a 82 y.o. male with medical history significant of HTN, coronary artery disease, chronic systolic CHF, A. fib on chronic anticoagulation with Coumadin, history of GI bleed and pressure ulcer, chronic indwelling Foley catheter    Presented with hematuria, patient had catheter replacement yesterday on 25th of May No bleeding at that time but started at night. Patient also reports some abdominal discomfort, patient states he shows gas after eating [place] to last night with beans. Family noted decreased urine output. In the middle and nonhealing most significant abdominal pain but improved after having a bowel movement but morning he continued to have abdominal discomfort that he had a vomiting at night. [facility] reccommended to irrigated the catheter again it did not seem to improve. Home health saw them today and felt there was too much bleeding and asked tham to come to ER.  Patient denies any chest pain no fever no cough no shortness of breath, family denies blood in stool, he has dark stool but he is on iron supplement.   Regarding pertinent Chronic problems: In March thousand 17 patient had to be admitted secondary to supratherapeutic INR and evidence of upper GI bleed with EEG showing gastritis and esophagitis his Coumadin was resumed started on twice a day PPI She has history of atrial fibrillation controlled with amiodarone DCCV to NSR in 6/14. Patient has known history of systolic heart failure in  2014 echogram showed EF of 25-30 percent TEE at that time showed possible left AA thrombus. Repeat echogram in October 2014 showed EF of 40% He has known history of CAD: LHC (4/14) with 60% pLAD stenosis. Patient developed post renal  obstruction due to BPH resulting in renal failure and had suprapubic Foley placed followed by urology IN ER: MAXIMUM TEMPERATURE 100. Initial blood pressure on arrival 98/51 now improved coronary left 103/57 WBC 29.8 hemoglobin 10.4 which was 277 creatinine up from baseline to 1.81 INR 2.4 lactic acid 2.24  CT of abdomen was done suggested sigmoid neoplasm with possible metastatic disease  UA showing multiple white blood cells and red blood cells and also many bacteria all suggestive of possible urinary tract infection urine culture has been sent  Hospitalist was called for admission for acute renal failure and dehydration and hematuria with possible UTI  Review of Systems:    Pertinent positives include: abdominal pain, nausea, vomiting, hematuria, change in color of urine,  Constitutional:  No weight loss, night sweats, Fevers, chills, fatigue, weight loss  HEENT:  No headaches, Difficulty swallowing,Tooth/dental problems,Sore throat,  No sneezing, itching, ear ache, nasal congestion, post nasal drip,  Cardio-vascular:  No chest pain, Orthopnea, PND, anasarca, dizziness, palpitations.no Bilateral lower extremity swelling  GI:  No heartburn, indigestion, diarrhea, change in bowel habits, loss of appetite, melena, blood in stool, hematemesis Resp:  no shortness of breath at rest. No dyspnea on exertion, No excess mucus, no productive cough, No non-productive cough, No coughing up of blood.No change in color of mucus.No wheezing. Skin:  no rash or lesions. No jaundice GU:  no dysuria, no urgency or frequency. No straining to urinate.  No flank pain.  Musculoskeletal:  No joint pain or no joint swelling. No decreased range of motion. No back pain.  Psych:  No change in  mood or affect. No depression or anxiety. No memory loss.  Neuro: no localizing neurological complaints, no tingling, no weakness, no double vision, no gait abnormality, no slurred speech, no confusion  As per HPI  otherwise 10 point review of systems negative.   Past Medical History: Past Medical History  Diagnosis Date  . Anemia     nos  . History of colonic polyps   . Hyperlipidemia   . Hypertension   . Cancer ([place]) [id number]    hx of basal cell  . BPH (benign prostatic hyperplasia)   . Anxiety attack     panic   . Pre-syncope     echo 03/2010,with EF 60%,mild RV dilation,no significant abnormalities...event monitor for 3wks/episodes os sinus bradycardia with heart rate to low 40's occasionally at night(suspect while sleeping)  . Atrial fibrillation ([place])     a. Dx 11/2012 - TEE with possible LAA thrombus, started on amio/coumadin.  . Systolic CHF ([place])     a. EF 25-30% 11/2012 ?tachy-mediated. b. Not on ACEI at dc due to AKI, can consider as OP.  [name] CAD (coronary artery disease)     a. Mod CAD by cath 12/10/12 - 60% long proximal LAD, 60% prox PDA.  [name] AKI (acute kidney injury) ([place])     a. Peak Cr 4.17 11/2012 - felt to be post-obstructive.  . Urinary retention     a. Post obstructive, dc'd with foley 12/2012.  [name] Renal cyst     a. By renal US 11/2012, not candidate for further eval.  . Thrombus of left atrial appendage     a. Possible LAA thrombus by TEE 11/2012.   Past Surgical History  Procedure Laterality Date  . Colonoscopy w/ polypectomy  2007  . Cholecystectomy    . Total knee arthroplasty    . Mohs surgery  2010  . Tee without cardioversion N/A 12/11/2012    Procedure: TRANSESOPHAGEAL ECHOCARDIOGRAM (TEE);  Surgeon: Thayer Headings, MD;  Location: [place];  Service: Cardiovascular;  Laterality: N/A;  ja/bev.  Darden Dates without cardioversion N/A 01/21/2013    Procedure: TRANSESOPHAGEAL ECHOCARDIOGRAM (TEE);  Surgeon: Larey Dresser, MD;  Location: [place];  Service: Cardiovascular;  Laterality: N/A;  . Cardioversion N/A 01/21/2013    Procedure: CARDIOVERSION;  Surgeon: Larey Dresser, MD;  Location: [facility];  Service: Cardiovascular;  Laterality: N/A;  . Left heart  catheterization with coronary angiogram N/A 12/10/2012    Procedure: LEFT HEART CATHETERIZATION WITH CORONARY ANGIOGRAM;  Surgeon: Larey Dresser, MD;  Location: [facility] CATH LAB;  Service: Cardiovascular;  Laterality: N/A;  . Esophagogastroduodenoscopy N/A 10/23/2015    Procedure: ESOPHAGOGASTRODUODENOSCOPY (EGD);  Surgeon: Wilford Corner, MD;  Location: [facility] ENDOSCOPY;  Service: Endoscopy;  Laterality: N/A;     Social History:  Ambulatory  Walker  Lives at home   With family has Care 24 h     reports that he quit smoking about 32 years ago. He does not have any smokeless tobacco history on file. He reports that he does not drink alcohol or use illicit drugs.  Allergies:   Allergies  Allergen Reactions  . Darifenacin Hydrobromide     REACTION: constipation  . Fluoxetine Hcl     REACTION: Very Depressed while taking    Family History:    Family History  Problem Relation Age of Onset  . Heart attack Father   . Heart failure Father 79    S/P Turp  . Heart disease Father 60    MI.Marland KitchenMarland KitchenCHF S/P  TURP @ 7  . Stroke Brother 43  . Coronary artery disease Brother   . Lung cancer Brother     Medications: Prior to Admission medications   Medication Sig Start Date End Date Taking? Authorizing Provider  amiodarone (PACERONE) 200 MG tablet Take 0.5 tablets (100 mg total) by mouth daily. 04/19/15  Yes Larey Dresser, MD  buPROPion (WELLBUTRIN) 75 MG tablet Take 1 tablet (75 mg total) by mouth 2 (two) times daily. 06/10/15  Yes Hendricks Limes, MD  finasteride (PROSCAR) 5 MG tablet Take 1 tablet (5 mg total) by mouth daily. 12/20/12  Yes Dayna N Dunn, PA-C  furosemide (LASIX) 40 MG tablet TAKE 1 TABLET (40 MG TOTAL) BY MOUTH EVERY OTHER DAY. 09/25/14  Yes Larey Dresser, MD  l-methylfolate-B6-B12 (METANX) 3-35-2 MG TABS tablet TAKE 1 TABLET BY MOUTH 2 (TWO) TIMES DAILY. 10/20/15  Yes Wallene Huh, DPM  zolpidem (AMBIEN) 5 MG tablet Take 7.5 mg by mouth at bedtime as needed for sleep.  12/14/15  Yes  Historical Provider, MD  acetaminophen (TYLENOL) 325 MG tablet Take 650 mg by mouth every 6 (six) hours as needed for moderate pain (pain).     Historical Provider, MD  ferrous sulfate 325 (65 FE) MG tablet Take 325 mg by mouth at bedtime.    Historical Provider, MD  levofloxacin (LEVAQUIN) 500 MG tablet Take 1 tablet (500 mg total) by mouth daily. 10/28/15   Costin Karlyne Greenspan, MD  lisinopril (PRINIVIL,ZESTRIL) 5 MG tablet TAKE ONE TABLET BY MOUTH DAILY 12/30/15   Larey Dresser, MD  Meth-Hyo-M Barnett Hatter Phos-Ph Sal (URIBEL PO) Take 1 capsule by mouth daily as needed (bladder spasm).     Historical Provider, MD  mirabegron ER (MYRBETRIQ) 25 MG TB24 tablet Take 25 mg by mouth daily.    Historical Provider, MD  Multiple Vitamin (MULTIVITAMIN) tablet Take 1 tablet by mouth daily.    Historical Provider, MD  pantoprazole (PROTONIX) 40 MG tablet Take 1 tablet (40 mg total) by mouth 2 (two) times daily before a meal. After one month take 1 tablet daily. 10/28/15   Costin Karlyne Greenspan, MD  phenazopyridine (PYRIDIUM) 100 MG tablet Take 100 mg by mouth 3 (three) times daily as needed for pain (bladder spasms).     Historical Provider, MD  pravastatin (PRAVACHOL) 80 MG tablet TAKE 1 TABLET (80 MG TOTAL) BY MOUTH DAILY. 07/13/15   Larey Dresser, MD  Probiotic Product (ALIGN) 4 MG CAPS Take 1 capsule by mouth daily.     Historical Provider, MD  traZODone (DESYREL) 50 MG tablet Take 50 mg by mouth at bedtime. 01/11/16   Historical Provider, MD  warfarin (COUMADIN) 2.5 MG tablet TAKE AS DIRECTED BY ANTICOAGULATION CLINIC 02/17/15   Larey Dresser, MD  warfarin (COUMADIN) 2.5 MG tablet Take 1.25-2.5 mg by mouth daily. Take 1.25mg  on Sunday,Tuesdays, and Thursdays. Then take 2.5mg  on Monday, Wednesdays, Fridays and Saturdays per daughter    Historical Provider, MD    Physical Exam: Patient Vitals for the past 24 hrs:  BP Temp Temp src Pulse Resp SpO2 Height Weight  01/14/16 1925 103/57 mmHg - - 67 17 94 % - -  01/14/16  1717 (!) 113/52 mmHg - - 69 16 96 % - -  01/14/16 1712 - 100 F (37.8 C) Rectal - - - - -  01/14/16 1429 (!) 98/51 mmHg 98.4 F (36.9 C) Oral 72 18 100 % 6' (1.829 m) 79.379 kg (175 lb)    1.  General:  in No Acute distress 2. Psychological: Alert and  Oriented 3. Head/ENT:    Dry Mucous Membranes                          Head Non traumatic, neck supple Cutaneous horn on the left eyebrow                           Poor Dentition 4. SKIN:  decreased Skin turgor,  Skin clean Dry and intact no rash 5. Heart: Regular rate and rhythm no  Murmur, Rub or gallop 6. Lungs:  Clear to auscultation bilaterally, no wheezes or crackles   7. Abdomen: Soft, non-tender, Non distended suprapubic catheter in place, green tinged urine in Foley catheter 8. Lower extremities: no clubbing, cyanosis, or edema 9. Neurologically Grossly intact, moving all 4 extremities equally 10. MSK: Normal range of motion   body mass index is 23.73 kg/(m^2).  Labs on Admission:   Labs on Admission: I have personally reviewed following labs and imaging studies  CBC:  Recent Labs Lab 01/14/16 1600  WBC 29.8*  HGB 10.4*  HCT 32.5*  MCV 91.0  PLT [id number]   Basic Metabolic Panel:  Recent Labs Lab 01/14/16 1600  NA 134*  K 4.5  CL 99*  CO2 27  GLUCOSE 127*  BUN 41*  CREATININE 1.81*  CALCIUM 8.8*   GFR: Estimated Creatinine Clearance: 30.4 mL/min (by C-G formula based on Cr of 1.81). Liver Function Tests: No results for input(s): AST, ALT, ALKPHOS, BILITOT, PROT, ALBUMIN in the last 168 hours. No results for input(s): LIPASE, AMYLASE in the last 168 hours. No results for input(s): AMMONIA in the last 168 hours. Coagulation Profile:  Recent Labs Lab 01/13/16 01/14/16 1600  INR 2.7 2.40*   Cardiac Enzymes: No results for input(s): CKTOTAL, CKMB, CKMBINDEX, TROPONINI in the last 168 hours. BNP (last 3 results) No results for input(s): PROBNP in the last 8760 hours. HbA1C: No results for input(s):  HGBA1C in the last 72 hours. CBG: No results for input(s): GLUCAP in the last 168 hours. Lipid Profile: No results for input(s): CHOL, HDL, LDLCALC, TRIG, CHOLHDL, LDLDIRECT in the last 72 hours. Thyroid Function Tests: No results for input(s): TSH, T4TOTAL, FREET4, T3FREE, THYROIDAB in the last 72 hours. Anemia Panel: No results for input(s): VITAMINB12, FOLATE, FERRITIN, TIBC, IRON, RETICCTPCT in the last 72 hours. Urine analysis:    Component Value Date/Time   COLORURINE AMBER* 01/14/2016 1605   APPEARANCEUR TURBID* 01/14/2016 1605   LABSPEC 1.016 01/14/2016 1605   PHURINE 7.5 01/14/2016 1605   GLUCOSEU NEGATIVE 01/14/2016 1605   HGBUR LARGE* 01/14/2016 [place] 01/14/2016 [place] 01/14/2016 1605   PROTEINUR 100* 01/14/2016 1605   UROBILINOGEN 0.2 11/13/2014 1944   NITRITE NEGATIVE 01/14/2016 1605   LEUKOCYTESUR LARGE* 01/14/2016 1605   Sepsis Labs: @LABRCNTIP (procalcitonin:4,lacticidven:4) )No results found for this or any previous visit (from the past 240 hour(s)).   UA  evidence of UTI  Lab Results  Component Value Date   HGBA1C 5.7 05/11/2011    Estimated Creatinine Clearance: 30.4 mL/min (by C-G formula based on Cr of 1.81).  BNP (last 3 results) No results for input(s): PROBNP in the last 8760 hours.   ECG REPORT No EKG obtained  Filed Weights   01/14/16 1429  Weight: 79.379 kg (175 lb)     Cultures:    Component Value Date/Time  SDES RIGHT ANTECUBITAL 10/23/2015 0912   SPECREQUEST BOTTLES DRAWN AEROBIC ONLY 5CC 10/23/2015 0912   CULT NO GROWTH 5 DAYS 10/23/2015 0912   REPTSTATUS 10/28/2015 FINAL 10/23/2015 0912     Radiological Exams on Admission: Ct Abdomen Pelvis Wo Contrast  01/14/2016  CLINICAL DATA:  Hematuria EXAM: CT ABDOMEN AND PELVIS WITHOUT CONTRAST TECHNIQUE: Multidetector CT imaging of the abdomen and pelvis was performed following the standard protocol without IV contrast. COMPARISON:  None.  FINDINGS: Lung bases demonstrate some mild scarring in the lower lobes. No acute infiltrate is noted. The liver demonstrates a geographic area of decreased attenuation measuring approximately 9 x 7.4 cm in dimension. This is consistent with a neoplasm till proven otherwise and may represent either primary or secondary neoplasm. The gallbladder has been surgically removed. The spleen, adrenal glands and pancreas are within normal limits. The kidneys are well visualized bilaterally. Multiple right renal cysts are seen. The largest of these lies in the upper pole measuring approximately 7.2 cm. A renal pelvic stone is noted on the left which measures approximately 11 mm. No obstructive changes are seen. The bladder is decompressed by Foley catheter. No definitive mass lesion is seen although evaluation is limited. Some air is noted related to the instrumentation. Fecal material is noted throughout the colon. Scattered diverticular changes noted without definitive diverticulitis. The appendix is within normal limits. In the mid sigmoid colon there is a 4.3 by 2.7 cm soft tissue lesion identified. It is best visualized on image number 65 of series 3 and image number 80 of series 4. This is highly suspicious for an underlying colonic neoplasm given the changes in the liver. Diffuse aortoiliac calcifications are seen without aneurysmal dilatation. No other focal abnormality is noted. IMPRESSION: Changes suggestive of a sigmoid neoplasm with associated liver metastatic disease. Further workup is recommended. Left renal pelvic stone without obstructive change. Renal cystic change. Diverticulosis without diverticulitis. Electronically Signed   By: Inez Catalina M.D.   On: 01/14/2016 18:41   Dg Abd Acute W/chest  01/14/2016  CLINICAL DATA:  Hematuria.  Generalized abdominal pain. EXAM: DG ABDOMEN ACUTE W/ 1V CHEST COMPARISON:  Chest x-ray 10/26/2015. FINDINGS: Heart is borderline in size. No confluent airspace opacities,  effusions or edema. Nonobstructive bowel gas pattern. No free air. Prior cholecystectomy. Moderate stool burden throughout the colon. No suspicious calcification. No acute bony abnormality. IMPRESSION: Moderate stool burden in the colon.  No obstruction or free air. Prior cholecystectomy. Borderline heart size.  No active cardiopulmonary disease. Electronically Signed   By: Rolm Baptise M.D.   On: 01/14/2016 15:47    Chart has been reviewed    Assessment/Plan   82 y.o. male with medical history significant of HTN, coronary artery disease, chronic systolic CHF, A. fib on chronic anticoagulation with Coumadin, history of GI bleed and pressure ulcer, chronic indwelling Foley catheter  [place] with hematuria acute redness failure secondary to dehydration and possible UTI, found to have a sigmoid mass possible metastatic spread to the liver   Present on Admission:  . Dehydration- administrative fluids  . CAD (coronary artery disease) currently stable continue to monitor  . Chronic systolic CHF (congestive heart failure) (HCC) avoid fluid overload currently given IV fluids but will need to resume Lasix once close to the euvolemia . UTI (lower urinary tract infection) - treat with Rocephin await results of urine culture  . Acute renal failure (ARF) (HCC) like to secondary to dehydration, hold basic administrative fluids, obtain urinary lites  . Hematuria -currently  resolved, appearance of the urine likely affected by a chronically green discoloration secondary to medication will follow up CBC hold Coumadin for now  . Essential hypertension even episode of hypotension will hold home medications  . Chronic atrial fibrillation (HCC) currently appears to be rate controlled, given  . Pressure ulcer - wound care evaluation  . Metastasis to liver [facility]) -  discussed with GI patient will need further workup holding Coumadin for now, may undergone sigmoidoscopy while hospitalized he had to see in the  morning  Other plan as per orders.  DVT prophylaxis:  SCD    Code Status:  FULL CODE  as per  family   Family Communication:   Family  at  Bedside  plan of care was discussed with   Daughter Toland Hanratty [phone]  Disposition Plan:    To home once workup is complete and patient is stable   Consults called: [facility]  Dr. Cristina Gong  Admission status:   Inpatient     Level of care     tele         I have spent a total of 56 min on this admission    Ariauna Farabee 01/14/2016, 9:06 PM    [facility]  Pager [phone]   after 2 AM please page floor coverage PA If 7AM-7PM, please contact the day team taking care of the patient  [email or web address]  Password [email or web address]

## 2016-01-14 NOTE — ED Notes (Signed)
RN ask to wait for second set of cultures to be drawn, pt is currently eating.

## 2016-01-14 NOTE — ED Provider Notes (Signed)
CSN: [id number]     Arrival date & time 01/14/16  1420 History   First MD Initiated Contact with Patient 01/14/16 1503     Chief Complaint  Patient presents with  . Hematuria     (Consider location/radiation/quality/duration/timing/severity/associated sxs/prior Treatment) HPI Comments: Patient with h/o upper GI bleed several months ago, on coumadin, presents with c/o abdominal pain, distention, hematuria starting after foley catheter change yesterday. After this time, patient also had generalized abdominal pain and distention which he attributes to gas after eating stew last night with beans in it. Patient had significant abdominal pain at midnight, called EMS, had a bowel movement and felt better. Transport declined at that time. Symptoms gradually returned this morning. Home health encouraged ED visit today due to the amount of blood. One episode of emesis overnight. No fever or diarrhea. H/o cholecystectomy.   Patient is a 82 y.o. male presenting with hematuria. The history is provided by the patient, medical records and a relative.  Hematuria Associated symptoms include abdominal pain and vomiting. Pertinent negatives include no chest pain, coughing, fever, headaches, myalgias, nausea, rash or sore throat.    Past Medical History  Diagnosis Date  . Anemia     nos  . History of colonic polyps   . Hyperlipidemia   . Hypertension   . Cancer ([place]) [id number]    hx of basal cell  . BPH (benign prostatic hyperplasia)   . Anxiety attack     panic   . Pre-syncope     echo 03/2010,with EF 60%,mild RV dilation,no significant abnormalities...event monitor for 3wks/episodes os sinus bradycardia with heart rate to low 40's occasionally at night(suspect while sleeping)  . Atrial fibrillation ([place])     a. Dx 11/2012 - TEE with possible LAA thrombus, started on amio/coumadin.  . Systolic CHF ([place])     a. EF 25-30% 11/2012 ?tachy-mediated. b. Not on ACEI at dc due to AKI, can consider as OP.  [name] CAD  (coronary artery disease)     a. Mod CAD by cath 12/10/12 - 60% long proximal LAD, 60% prox PDA.  [name] AKI (acute kidney injury) ([place])     a. Peak Cr 4.17 11/2012 - felt to be post-obstructive.  . Urinary retention     a. Post obstructive, dc'd with foley 12/2012.  [name] Renal cyst     a. By renal US 11/2012, not candidate for further eval.  . Thrombus of left atrial appendage     a. Possible LAA thrombus by TEE 11/2012.   Past Surgical History  Procedure Laterality Date  . Colonoscopy w/ polypectomy  2007  . Cholecystectomy    . Total knee arthroplasty    . Mohs surgery  2010  . Tee without cardioversion N/A 12/11/2012    Procedure: TRANSESOPHAGEAL ECHOCARDIOGRAM (TEE);  Surgeon: Thayer Headings, MD;  Location: [place];  Service: Cardiovascular;  Laterality: N/A;  ja/bev.  Darden Dates without cardioversion N/A 01/21/2013    Procedure: TRANSESOPHAGEAL ECHOCARDIOGRAM (TEE);  Surgeon: Larey Dresser, MD;  Location: [facility];  Service: Cardiovascular;  Laterality: N/A;  . Cardioversion N/A 01/21/2013    Procedure: CARDIOVERSION;  Surgeon: Larey Dresser, MD;  Location: [facility];  Service: Cardiovascular;  Laterality: N/A;  . Left heart catheterization with coronary angiogram N/A 12/10/2012    Procedure: LEFT HEART CATHETERIZATION WITH CORONARY ANGIOGRAM;  Surgeon: Larey Dresser, MD;  Location[facility] CATH LAB;  Service: Cardiovascular;  Laterality: N/A;  . Esophagogastroduodenoscopy N/A 10/23/2015    Procedure: ESOPHAGOGASTRODUODENOSCOPY (EGD);  Surgeon: Wilford Corner, MD;  Location[facility] ENDOSCOPY;  Service: Endoscopy;  Laterality: N/A;   Family History  Problem Relation Age of Onset  . Heart attack Father   . Heart failure Father 15    S/P Turp  . Heart disease Father 47    MI.Marland KitchenMarland KitchenCHF S/P TURP @ 72  . Stroke Brother 49  . Coronary artery disease Brother   . Lung cancer Brother    Social History  Substance Use Topics  . Smoking status: Former Smoker    Quit date: 08/22/1983  . Smokeless  tobacco: None  . Alcohol Use: No    Review of Systems  Constitutional: Negative for fever.  HENT: Negative for rhinorrhea and sore throat.   Eyes: Negative for redness.  Respiratory: Negative for cough.   Cardiovascular: Negative for chest pain.  Gastrointestinal: Positive for vomiting, abdominal pain and abdominal distention. Negative for nausea and diarrhea.  Genitourinary: Positive for hematuria. Negative for dysuria.  Musculoskeletal: Negative for myalgias.  Skin: Negative for rash.  Neurological: Negative for headaches.    Allergies  Darifenacin hydrobromide and Fluoxetine hcl  Home Medications   Prior to Admission medications   Medication Sig Start Date End Date Taking? Authorizing Provider  acetaminophen (TYLENOL) 325 MG tablet Take 650 mg by mouth every 6 (six) hours as needed for moderate pain (pain).     Historical Provider, MD  amiodarone (PACERONE) 200 MG tablet Take 0.5 tablets (100 mg total) by mouth daily. 04/19/15   Larey Dresser, MD  buPROPion (WELLBUTRIN) 75 MG tablet Take 1 tablet (75 mg total) by mouth 2 (two) times daily. Patient not taking: Reported on 10/22/2015 06/10/15   Hendricks Limes, MD  ferrous sulfate 325 (65 FE) MG tablet Take 325 mg by mouth at bedtime.    Historical Provider, MD  finasteride (PROSCAR) 5 MG tablet Take 1 tablet (5 mg total) by mouth daily. 12/20/12   Dayna N Dunn, PA-C  furosemide (LASIX) 40 MG tablet TAKE 1 TABLET (40 MG TOTAL) BY MOUTH EVERY OTHER DAY. 09/25/14   Larey Dresser, MD  l-methylfolate-B6-B12 (METANX) 3-35-2 MG TABS tablet TAKE 1 TABLET BY MOUTH 2 (TWO) TIMES DAILY. Patient not taking: Reported on 10/22/2015 10/20/15   Wallene Huh, DPM  levofloxacin (LEVAQUIN) 500 MG tablet Take 1 tablet (500 mg total) by mouth daily. 10/28/15   Costin Karlyne Greenspan, MD  lisinopril (PRINIVIL,ZESTRIL) 5 MG tablet TAKE ONE TABLET BY MOUTH DAILY 12/30/15   Larey Dresser, MD  Meth-Hyo-M Barnett Hatter Phos-Ph Sal (URIBEL PO) Take 1 capsule by mouth daily as  needed (bladder spasm).     Historical Provider, MD  mirabegron ER (MYRBETRIQ) 25 MG TB24 tablet Take 25 mg by mouth daily.    Historical Provider, MD  Multiple Vitamin (MULTIVITAMIN) tablet Take 1 tablet by mouth daily.    Historical Provider, MD  pantoprazole (PROTONIX) 40 MG tablet Take 1 tablet (40 mg total) by mouth 2 (two) times daily before a meal. After one month take 1 tablet daily. 10/28/15   Costin Karlyne Greenspan, MD  phenazopyridine (PYRIDIUM) 100 MG tablet Take 100 mg by mouth 3 (three) times daily as needed for pain (bladder spasms).     Historical Provider, MD  pravastatin (PRAVACHOL) 80 MG tablet TAKE 1 TABLET (80 MG TOTAL) BY MOUTH DAILY. 07/13/15   Larey Dresser, MD  Probiotic Product (ALIGN) 4 MG CAPS Take 1 capsule by mouth daily.     Historical Provider, MD  warfarin (COUMADIN) 2.5 MG tablet  TAKE AS DIRECTED BY ANTICOAGULATION CLINIC 02/17/15   Larey Dresser, MD  warfarin (COUMADIN) 2.5 MG tablet Take 1.25-2.5 mg by mouth daily. Take 1.25mg  on Sunday,Tuesdays, and Thursdays. Then take 2.5mg  on Monday, Wednesdays, Fridays and Saturdays per daughter    Historical Provider, MD   BP 98/51 mmHg  Pulse 72  Temp(Src) 98.4 F (36.9 C) (Oral)  Resp 18  Ht 6' (1.829 m)  Wt 79.379 kg  BMI 23.73 kg/m2  SpO2 100%   Physical Exam  Constitutional: He appears well-developed and well-nourished.  HENT:  Head: Normocephalic and atraumatic.  Eyes: Conjunctivae are normal. Right eye exhibits no discharge. Left eye exhibits no discharge.  Neck: Normal range of motion. Neck supple.  Cardiovascular: Normal rate, regular rhythm and normal heart sounds.   No murmur heard. Pulmonary/Chest: Effort normal and breath sounds normal. No respiratory distress. He has no wheezes. He has no rales.  Abdominal: Soft. He exhibits distension (tympanic percussion note). There is no tenderness. There is no rebound and no guarding.  Genitourinary: Right testis shows no mass and no tenderness. Left testis shows  no mass and no tenderness. No penile erythema or penile tenderness. Discharge (dried blood) found.  Dried blood noted in groin, around meatus, but no active bleeding.   Neurological: He is alert.  Skin: Skin is warm and dry.  Psychiatric: He has a normal mood and affect.  Nursing note and vitals reviewed.   ED Course  Procedures (including critical care time) Labs Review Labs Reviewed  URINALYSIS, ROUTINE W REFLEX MICROSCOPIC (NOT AT [facility]) - Abnormal; Notable for the following:    Color, Urine AMBER (*)    APPearance TURBID (*)    Hgb urine dipstick LARGE (*)    Protein, ur 100 (*)    Leukocytes, UA LARGE (*)    All other components within normal limits  CBC - Abnormal; Notable for the following:    WBC 29.8 (*)    RBC 3.57 (*)    Hemoglobin 10.4 (*)    HCT 32.5 (*)    RDW 17.1 (*)    All other components within normal limits  BASIC METABOLIC PANEL - Abnormal; Notable for the following:    Sodium 134 (*)    Chloride 99 (*)    Glucose, Bld 127 (*)    BUN 41 (*)    Creatinine, Ser 1.81 (*)    Calcium 8.8 (*)    GFR calc non Af Amer 31 (*)    GFR calc Af Amer 36 (*)    All other components within normal limits  PROTIME-INR - Abnormal; Notable for the following:    Prothrombin Time 25.9 (*)    INR 2.40 (*)    All other components within normal limits  URINE MICROSCOPIC-ADD ON - Abnormal; Notable for the following:    Squamous Epithelial / LPF 0-5 (*)    Bacteria, UA MANY (*)    All other components within normal limits  I-STAT CG4 LACTIC ACID, ED - Abnormal; Notable for the following:    Lactic Acid, Venous 2.24 (*)    All other components within normal limits  URINE CULTURE  HEPATIC FUNCTION PANEL  LIPASE, BLOOD    Imaging Review Ct Abdomen Pelvis Wo Contrast  01/14/2016  CLINICAL DATA:  Hematuria EXAM: CT ABDOMEN AND PELVIS WITHOUT CONTRAST TECHNIQUE: Multidetector CT imaging of the abdomen and pelvis was performed following the standard protocol without IV  contrast. COMPARISON:  None. FINDINGS: Lung bases demonstrate some mild scarring in the  lower lobes. No acute infiltrate is noted. The liver demonstrates a geographic area of decreased attenuation measuring approximately 9 x 7.4 cm in dimension. This is consistent with a neoplasm till proven otherwise and may represent either primary or secondary neoplasm. The gallbladder has been surgically removed. The spleen, adrenal glands and pancreas are within normal limits. The kidneys are well visualized bilaterally. Multiple right renal cysts are seen. The largest of these lies in the upper pole measuring approximately 7.2 cm. A renal pelvic stone is noted on the left which measures approximately 11 mm. No obstructive changes are seen. The bladder is decompressed by Foley catheter. No definitive mass lesion is seen although evaluation is limited. Some air is noted related to the instrumentation. Fecal material is noted throughout the colon. Scattered diverticular changes noted without definitive diverticulitis. The appendix is within normal limits. In the mid sigmoid colon there is a 4.3 by 2.7 cm soft tissue lesion identified. It is best visualized on image number 65 of series 3 and image number 80 of series 4. This is highly suspicious for an underlying colonic neoplasm given the changes in the liver. Diffuse aortoiliac calcifications are seen without aneurysmal dilatation. No other focal abnormality is noted. IMPRESSION: Changes suggestive of a sigmoid neoplasm with associated liver metastatic disease. Further workup is recommended. Left renal pelvic stone without obstructive change. Renal cystic change. Diverticulosis without diverticulitis. Electronically Signed   By: Inez Catalina M.D.   On: 01/14/2016 18:41   Dg Abd Acute W/chest  01/14/2016  CLINICAL DATA:  Hematuria.  Generalized abdominal pain. EXAM: DG ABDOMEN ACUTE W/ 1V CHEST COMPARISON:  Chest x-ray 10/26/2015. FINDINGS: Heart is borderline in size. No  confluent airspace opacities, effusions or edema. Nonobstructive bowel gas pattern. No free air. Prior cholecystectomy. Moderate stool burden throughout the colon. No suspicious calcification. No acute bony abnormality. IMPRESSION: Moderate stool burden in the colon.  No obstruction or free air. Prior cholecystectomy. Borderline heart size.  No active cardiopulmonary disease. Electronically Signed   By: Rolm Baptise M.D.   On: 01/14/2016 15:47   I have personally reviewed and evaluated these images and lab results as part of my medical decision-making.  3:05 PM Patient seen and examined. Work-up initiated. Medications ordered.   Vital signs reviewed and are as follows: BP 98/51 mmHg  Pulse 72  Temp(Src) 98.4 F (36.9 C) (Oral)  Resp 18  Ht 6' (1.829 m)  Wt 79.379 kg  BMI 23.73 kg/m2  SpO2 100%   3:27 PM Patient discussed with Dr. Audie Pinto.   4:41 PM WBC noted. Will CT abd/pelvis. AKI noted. Lactate and fluids ordered with rectal temp.   Patient does not have obvious infection. If lactate or rectal temp elevated -- will get blood cx, give fluids.   7:37 PM Admit to medicine for UTI/AKI. Daughter and patient informed of colon and liver findings.   Fluids, Rocephin ordered.   MDM   Final diagnoses:  Acute kidney injury ([place])  UTI (lower urinary tract infection)  Colonic mass  Hematuria   Admit.     Carlisle Cater, PA-C 01/14/16 1938  Leonard Schwartz, MD 01/16/16 [phone]

## 2016-01-14 NOTE — ED Notes (Signed)
PA made aware of patient lactic result. 

## 2016-01-14 NOTE — ED Notes (Signed)
According to EMS, pt c/o of 1500cc output of dark red urine from foley today. Pt catheter was replaced yesterday w/o incident. Today, foley was emptied and irrigated today at 1:30pm by homehealth RN. Since that time hematuria output w/o discomfort/pain.

## 2016-01-14 NOTE — ED Notes (Signed)
Spoke to PA regarding need for Blood Cultures. PA declined need for Blood Cultures at this time.

## 2016-01-15 DIAGNOSIS — C187 Malignant neoplasm of sigmoid colon: Secondary | ICD-10-CM

## 2016-01-15 LAB — CBC
HCT: 29.4 % — ABNORMAL LOW (ref 39.0–52.0)
HEMATOCRIT: 27.8 % — AB (ref 39.0–52.0)
HEMOGLOBIN: 9.3 g/dL — AB (ref 13.0–17.0)
HEMOGLOBIN: 8.9 g/dL — AB (ref 13.0–17.0)
MCH: 29 pg (ref 26.0–34.0)
MCH: 29.4 pg (ref 26.0–34.0)
MCHC: 31.6 g/dL (ref 30.0–36.0)
MCHC: 32 g/dL (ref 30.0–36.0)
MCV: 91.6 fL (ref 78.0–100.0)
MCV: 91.7 fL (ref 78.0–100.0)
PLATELETS: 207 10*3/uL (ref 150–400)
Platelets: 178 10*3/uL (ref 150–400)
RBC: 3.21 MIL/uL — AB (ref 4.22–5.81)
RBC: 3.03 MIL/uL — ABNORMAL LOW (ref 4.22–5.81)
RDW: 17.3 % — ABNORMAL HIGH (ref 11.5–15.5)
RDW: 17.5 % — ABNORMAL HIGH (ref 11.5–15.5)
WBC: 13 10*3/uL — AB (ref 4.0–10.5)
WBC: 10.1 10*3/uL (ref 4.0–10.5)

## 2016-01-15 LAB — COMPREHENSIVE METABOLIC PANEL
ALBUMIN: 2.8 g/dL — AB (ref 3.5–5.0)
ALK PHOS: 119 U/L (ref 38–126)
ALT: 30 U/L (ref 17–63)
ANION GAP: 8 (ref 5–15)
AST: 28 U/L (ref 15–41)
BILIRUBIN TOTAL: 0.6 mg/dL (ref 0.3–1.2)
BUN: 40 mg/dL — ABNORMAL HIGH (ref 6–20)
CO2: 25 mmol/L (ref 22–32)
CREATININE: 1.64 mg/dL — AB (ref 0.61–1.24)
Calcium: 8.7 mg/dL — ABNORMAL LOW (ref 8.9–10.3)
Chloride: 105 mmol/L (ref 101–111)
GFR calc Af Amer: 41 mL/min — ABNORMAL LOW (ref >60)
GFR calc non Af Amer: 35 mL/min — ABNORMAL LOW (ref >60)
Glucose, Bld: 102 mg/dL — ABNORMAL HIGH (ref 65–99)
POTASSIUM: 4.3 mmol/L (ref 3.5–5.1)
SODIUM: 138 mmol/L (ref 135–145)
Total Protein: 6.2 g/dL — ABNORMAL LOW (ref 6.5–8.1)

## 2016-01-15 LAB — SODIUM, URINE, RANDOM: Sodium, Ur: 27 mmol/L

## 2016-01-15 LAB — PHOSPHORUS: PHOSPHORUS: 4 mg/dL (ref 2.5–4.6)

## 2016-01-15 LAB — CREATININE, URINE, RANDOM: CREATININE, URINE: 104.14 mg/dL

## 2016-01-15 LAB — PROTIME-INR
INR: 2.76 — AB (ref 0.00–1.49)
Prothrombin Time: 28.8 seconds — ABNORMAL HIGH (ref 11.6–15.2)

## 2016-01-15 LAB — MRSA PCR SCREENING: MRSA BY PCR: POSITIVE — AB

## 2016-01-15 LAB — MAGNESIUM: Magnesium: 2.1 mg/dL (ref 1.7–2.4)

## 2016-01-15 LAB — TSH: TSH: 1.61 u[IU]/mL (ref 0.350–4.500)

## 2016-01-15 MED ORDER — SENNOSIDES-DOCUSATE SODIUM 8.6-50 MG PO TABS
2.0000 | ORAL_TABLET | Freq: Once | ORAL | Status: AC
  Administered 2016-01-15: 2 via ORAL
  Filled 2016-01-15: qty 2

## 2016-01-15 MED ORDER — POLYETHYLENE GLYCOL 3350 17 G PO PACK
17.0000 g | PACK | Freq: Two times a day (BID) | ORAL | Status: DC
Start: 2016-01-15 — End: 2016-01-16
  Administered 2016-01-15 (×2): 17 g via ORAL
  Filled 2016-01-15 (×2): qty 1

## 2016-01-15 MED ORDER — CHLORHEXIDINE GLUCONATE CLOTH 2 % EX PADS
6.0000 | MEDICATED_PAD | Freq: Every day | CUTANEOUS | Status: AC
  Administered 2016-01-16 – 2016-01-19 (×4): 6 via TOPICAL

## 2016-01-15 MED ORDER — MUPIROCIN 2 % EX OINT
1.0000 "application " | TOPICAL_OINTMENT | Freq: Two times a day (BID) | CUTANEOUS | Status: AC
  Administered 2016-01-15 – 2016-01-19 (×10): 1 via NASAL
  Filled 2016-01-15 (×2): qty 22

## 2016-01-15 NOTE — Progress Notes (Signed)
CRITICAL VALUE ALERT  Critical value received:  Blood culture gram positive cocci in clusters  Date of notification:  01/15/2016  Time of notification:  2104  Critical value read back:Yes.    Nurse who received alert:  J.Mina Babula  MD notified (1st page):  [place]   Time of first page:  2105  MD notified (2nd page):  Time of second page:  Responding MD:    Time MD responded:  2110, no new orders given.

## 2016-01-15 NOTE — Progress Notes (Signed)
PROGRESS NOTE    BRILYN KISE  [id number] DOB: Aug 12, 1925 DOA: 01/14/2016 PCP: No primary care provider on file.  Brief Narrative: Mr. Buglione is an 82 year old gentleman currently resides in the community with family members, having history of dyslipidemia, hypertension, atrial fibrillation, coronary artery disease, admitted to the medicine service on 01/14/2016 with complaints of hematuria. He is currently anticoagulated with warfarin for history of atrial fibrillation. Workup in the emergency department included a CT scan of abdomen and pelvis without contrast that revealed 4.3 x 2.7 cm mass in the sigmoid colon concerning for malignancy. Radiology also pointed out hepatic lesions could be related to metastatic disease. GI consulted for possible colonoscopy.   Assessment & Plan:   Active Problems:   Essential hypertension   UTI (lower urinary tract infection)   CAD (coronary artery disease)   Chronic systolic CHF (congestive heart failure) (HCC)   Long term (current) use of anticoagulants   Pressure ulcer   Dehydration   Acute renal failure (ARF) (HCC)   Hematuria   Chronic atrial fibrillation ([place])   Metastasis to liver ([place])  1.  Suspected metastatic colon cancer. -Mr. Montalbano is an 82 year old gentleman having his last colonoscopy in 2007 at which time had removal of malignant polyp. Surveillance exam later that ear unremarkable. -CT scan of abdomen and pelvis performed in the emergency room showed a 4.3 x 2.7 cm mass involving sigmoid colon along with liver lesions that could represent metastatic disease. -Dr Cristina Gong of GI consulted -Plan for bowel prep with sigmoidoscopy once INR trends down since he'll need a biopsy.  2.  Hematuria. -Initially presented with complaints of hematuria overnight. He is anticoagulated with warfarin presented with INR of 2.76 and PTT of 28.8. -CT showed left renal pelvic stone without obstructive changes. -Urine analysis consistent with urinary  tract infection. -Continue empiric IV antibiotic therapy with ceftriaxone 1 g IV every 24 hours, follow-up on urine cultures. -On my evaluation this morning urine appeared clear up. Has green color to it I suspect related to food pigments  3.  History of atrial fibrillation -CHADSVASC score 5 -Has been anticoagulated with warfarin. Warfarin held since CT scan revealed sigmoid mass and will require sigmoidoscopy with biopsy -Continue amiodarone 100 mg by mouth daily  4.  Dyslipidemia. -Continue pravastatin 80 mg by mouth daily  5.  Acute kidney injury. -Presented with creatinine of 1.8, previously 0.89 on 10/27/2015. Suspect related to prerenal azotemia. -He was started on IV fluids, with downward trend in creatinine to 1.64. -Continue normal saline running at 75 mL/hour   DVT prophylaxis: He is fully anticoagulated also has SCDs Code Status: Full code Family Communication:  Disposition Plan: Will treat urinary tract infection, provide IV fluids, GI consulted for sigmoidoscopy   Consultants:   GI   Procedures:   Antimicrobials:   Ceftriaxone 1 g IV every 24 hours  Subjective: Mr. Bowlin is awake and alert, no acute distress, has no complaints for me  Objective: Filed Vitals:   01/14/16 2130 01/14/16 2142 01/14/16 2211 01/15/16 0532  BP: 107/44 107/44 108/54 116/54  Pulse: 72 73 74 74  Temp:   98.6 F (37 C) 99.2 F (37.3 C)  TempSrc:   Oral Oral  Resp:  17 18 20   Height:   6' (1.829 m)   Weight:   75.7 kg (166 lb 14.2 oz)   SpO2: 98% 97% 98% 97%    Intake/Output Summary (Last 24 hours) at 01/15/16 1304 Last data filed at 01/15/16 [id number]  Gross  per 24 hour  Intake      0 ml  Output    425 ml  Net   -425 ml   Filed Weights   01/14/16 1429 01/14/16 2211  Weight: 79.379 kg (175 lb) 75.7 kg (166 lb 14.2 oz)    Examination:  General exam: Elderly gentleman, no acute distress Respiratory system: Clear to auscultation. Respiratory effort normal. Cardiovascular  system: S1 & S2 heard, RRR. No JVD, murmurs, rubs, gallops or clicks. No pedal edema. Gastrointestinal system: Abdomen is nondistended, soft and nontender. No organomegaly or masses felt. Normal bowel sounds heard. Central nervous system: Alert and oriented. No focal neurological deficits. Extremities: Symmetric 5 x 5 power. Skin: No rashes, lesions or ulcers Psychiatry: He may be mildly confused    Data Reviewed: I have personally reviewed following labs and imaging studies  CBC:  Recent Labs Lab 01/14/16 1600 01/14/16 2247 01/15/16 0520 01/15/16 1038  WBC 29.8* 19.7* 13.0* 10.1  NEUTROABS  --  17.7*  --   --   HGB 10.4* 9.6* 9.3* 8.9*  HCT 32.5* 29.7* 29.4* 27.8*  MCV 91.0 89.5 91.6 91.7  PLT 277 240 207 0000000   Basic Metabolic Panel:  Recent Labs Lab 01/14/16 1600 01/15/16 0520  NA 134* 138  K 4.5 4.3  CL 99* 105  CO2 27 25  GLUCOSE 127* 102*  BUN 41* 40*  CREATININE 1.81* 1.64*  CALCIUM 8.8* 8.7*  MG  --  2.1  PHOS  --  4.0   GFR: Estimated Creatinine Clearance: 32.7 mL/min (by C-G formula based on Cr of 1.64). Liver Function Tests:  Recent Labs Lab 01/14/16 1600 01/15/16 0520  AST 33 28  ALT 37 30  ALKPHOS 141* 119  BILITOT 0.7 0.6  PROT 7.0 6.2*  ALBUMIN 3.1* 2.8*    Recent Labs Lab 01/14/16 1600  LIPASE 20   No results for input(s): AMMONIA in the last 168 hours. Coagulation Profile:  Recent Labs Lab 01/13/16 01/14/16 1600 01/15/16 0520  INR 2.7 2.40* 2.76*   Cardiac Enzymes: No results for input(s): CKTOTAL, CKMB, CKMBINDEX, TROPONINI in the last 168 hours. BNP (last 3 results) No results for input(s): PROBNP in the last 8760 hours. HbA1C: No results for input(s): HGBA1C in the last 72 hours. CBG: No results for input(s): GLUCAP in the last 168 hours. Lipid Profile: No results for input(s): CHOL, HDL, LDLCALC, TRIG, CHOLHDL, LDLDIRECT in the last 72 hours. Thyroid Function Tests:  Recent Labs  01/15/16 0520  TSH 1.610    Anemia Panel: No results for input(s): VITAMINB12, FOLATE, FERRITIN, TIBC, IRON, RETICCTPCT in the last 72 hours. Sepsis Labs:  Recent Labs Lab 01/14/16 1737  LATICACIDVEN 2.24*    Recent Results (from the past 240 hour(s))  MRSA PCR Screening     Status: Abnormal   Collection Time: 01/14/16 10:42 PM  Result Value Ref Range Status   MRSA by PCR POSITIVE (A) NEGATIVE Final    Comment:        The GeneXpert MRSA Assay (FDA approved for NASAL specimens only), is one component of a comprehensive MRSA colonization surveillance program. It is not intended to diagnose MRSA infection nor to guide or monitor treatment for MRSA infections. RESULT CALLED TO, READ BACK BY AND VERIFIED WITH: A LAMB RN [id number] 01/15/16 A [facility]          Radiology Studies: Ct Abdomen Pelvis Wo Contrast  01/14/2016  CLINICAL DATA:  Hematuria EXAM: CT ABDOMEN AND PELVIS WITHOUT CONTRAST TECHNIQUE: Multidetector CT imaging  of the abdomen and pelvis was performed following the standard protocol without IV contrast. COMPARISON:  None. FINDINGS: Lung bases demonstrate some mild scarring in the lower lobes. No acute infiltrate is noted. The liver demonstrates a geographic area of decreased attenuation measuring approximately 9 x 7.4 cm in dimension. This is consistent with a neoplasm till proven otherwise and may represent either primary or secondary neoplasm. The gallbladder has been surgically removed. The spleen, adrenal glands and pancreas are within normal limits. The kidneys are well visualized bilaterally. Multiple right renal cysts are seen. The largest of these lies in the upper pole measuring approximately 7.2 cm. A renal pelvic stone is noted on the left which measures approximately 11 mm. No obstructive changes are seen. The bladder is decompressed by Foley catheter. No definitive mass lesion is seen although evaluation is limited. Some air is noted related to the instrumentation. Fecal material is noted  throughout the colon. Scattered diverticular changes noted without definitive diverticulitis. The appendix is within normal limits. In the mid sigmoid colon there is a 4.3 by 2.7 cm soft tissue lesion identified. It is best visualized on image number 65 of series 3 and image number 80 of series 4. This is highly suspicious for an underlying colonic neoplasm given the changes in the liver. Diffuse aortoiliac calcifications are seen without aneurysmal dilatation. No other focal abnormality is noted. IMPRESSION: Changes suggestive of a sigmoid neoplasm with associated liver metastatic disease. Further workup is recommended. Left renal pelvic stone without obstructive change. Renal cystic change. Diverticulosis without diverticulitis. Electronically Signed   By: Inez Catalina M.D.   On: 01/14/2016 18:41   Dg Abd Acute W/chest  01/14/2016  CLINICAL DATA:  Hematuria.  Generalized abdominal pain. EXAM: DG ABDOMEN ACUTE W/ 1V CHEST COMPARISON:  Chest x-ray 10/26/2015. FINDINGS: Heart is borderline in size. No confluent airspace opacities, effusions or edema. Nonobstructive bowel gas pattern. No free air. Prior cholecystectomy. Moderate stool burden throughout the colon. No suspicious calcification. No acute bony abnormality. IMPRESSION: Moderate stool burden in the colon.  No obstruction or free air. Prior cholecystectomy. Borderline heart size.  No active cardiopulmonary disease. Electronically Signed   By: Rolm Baptise M.D.   On: 01/14/2016 15:47        Scheduled Meds: . amiodarone  100 mg Oral Daily  . buPROPion  75 mg Oral BID  . cefTRIAXone (ROCEPHIN) IVPB 1 gram/50 mL D5W  1 g Intravenous Q24H  . Chlorhexidine Gluconate Cloth  6 each Topical Q0600  . mirabegron ER  25 mg Oral Daily  . mupirocin ointment  1 application Nasal BID  . pantoprazole  40 mg Oral BID AC  . polyethylene glycol  17 g Oral BID  . pravastatin  80 mg Oral Daily  . senna-docusate  2 tablet Oral Once  . sodium chloride flush  3  mL Intravenous Q12H  . traZODone  50 mg Oral QHS   Continuous Infusions: . sodium chloride 75 mL/hr at 01/14/16 2312     LOS: 1 day    Time spent: 35 min    Kelvin Cellar, MD [facility] Pager [phone]  If 7PM-7AM, please contact night-coverage [email or web address] Password [email or web address] 01/15/2016, 1:04 PM

## 2016-01-15 NOTE — Consult Note (Signed)
Referring Provider:  Dr. Roswell Nickel Primary Care Physician:  No primary care provider on file. Primary Gastroenterologist:  None (unassigned)--Was seen by Dr. Michail Sermon for UGIB last February.  Reason for Consultation:  Sigmoid mass  HPI: [name] is a debilitated 82 y.o. male admitted to the hospital last night with hematuria following catheter placement, and was found incidentally on CT scanning to have a sigmoid mass and evidence of metastatic disease in the liver (CT scan reviewed).   He was seen by our group back in February when he was over anticoagulated and had GI bleeding characterized by dark emesis and drop in hemoglobin, Hemoccult negative at the time, at which time endoscopic evaluation showed some gastritis and esophagitis. For some reason, this result is not showing up in EPIC to my initial review, although we have a scanned copy of the report, which was done at [facility] on 10/23/2015, on our office medical record.  Review of EPIC indicates that this patient had a malignant sigmoid polyp removed in March 2007 by Dr. Vickii Chafe,  with follow-up colonoscopy in June 2007 by Dr. Sharlett Iles compromised by a poor prep, but the sigmoid area seemed fairly clear from the description of the report in the photographs, and apparently no residual polyp tissue was seen. The patient was subsequently lost to follow-up.  At this time, the patient is oriented to time and place, and seems mentally coherent, and indicates that there has been no significant change in bowel habits from his chronic long-standing constipation. He had some transient left-sided pain after a meal the other day, but does not have any persistent pain. He has not noted any blood in his stools and is maintained on anticoagulation with warfarin as an outpatient because of atrial fibrillation.    his CT scan on this admission, which I reviewed, shows a large amount of stool in the proximal colon but no overt colonic  distention or evidence of severe bowel obstruction.   Past Medical History  Diagnosis Date  . Anemia     nos  . History of colonic polyps   . Hyperlipidemia   . Hypertension   . Cancer ([place]) [id number]    hx of basal cell  . BPH (benign prostatic hyperplasia)   . Anxiety attack     panic   . Pre-syncope     echo 03/2010,with EF 60%,mild RV dilation,no significant abnormalities...event monitor for 3wks/episodes os sinus bradycardia with heart rate to low 40's occasionally at night(suspect while sleeping)  . Atrial fibrillation ([place])     a. Dx 11/2012 - TEE with possible LAA thrombus, started on amio/coumadin.  . Systolic CHF ([place])     a. EF 25-30% 11/2012 ?tachy-mediated. b. Not on ACEI at dc due to AKI, can consider as OP.  [name] CAD (coronary artery disease)     a. Mod CAD by cath 12/10/12 - 60% long proximal LAD, 60% prox PDA.  [name] AKI (acute kidney injury) ([place])     a. Peak Cr 4.17 11/2012 - felt to be post-obstructive.  . Urinary retention     a. Post obstructive, dc'd with foley 12/2012.  [name] Renal cyst     a. By renal US 11/2012, not candidate for further eval.  . Thrombus of left atrial appendage     a. Possible LAA thrombus by TEE 11/2012.    Past Surgical History  Procedure Laterality Date  . Colonoscopy w/ polypectomy  2007  . Cholecystectomy    . Total knee  arthroplasty    . Mohs surgery  2010  . Tee without cardioversion N/A 12/11/2012    Procedure: TRANSESOPHAGEAL ECHOCARDIOGRAM (TEE);  Surgeon: Thayer Headings, MD;  Location: [place];  Service: Cardiovascular;  Laterality: N/A;  ja/bev.  Darden Dates without cardioversion N/A 01/21/2013    Procedure: TRANSESOPHAGEAL ECHOCARDIOGRAM (TEE);  Surgeon: Larey Dresser, MD;  Location: [place];  Service: Cardiovascular;  Laterality: N/A;  . Cardioversion N/A 01/21/2013    Procedure: CARDIOVERSION;  Surgeon: Larey Dresser, MD;  Location: [place];  Service: Cardiovascular;  Laterality: N/A;  . Left heart catheterization with  coronary angiogram N/A 12/10/2012    Procedure: LEFT HEART CATHETERIZATION WITH CORONARY ANGIOGRAM;  Surgeon: Larey Dresser, MD;  Location: [facility] CATH LAB;  Service: Cardiovascular;  Laterality: N/A;  . Esophagogastroduodenoscopy N/A 10/23/2015    Procedure: ESOPHAGOGASTRODUODENOSCOPY (EGD);  Surgeon: Wilford Corner, MD;  Location: [facility] ENDOSCOPY;  Service: Endoscopy;  Laterality: N/A;    Prior to Admission medications   Medication Sig Start Date End Date Taking? Authorizing Provider  acetaminophen (TYLENOL) 325 MG tablet Take 650 mg by mouth every 6 (six) hours as needed for moderate pain (pain).    Yes Historical Provider, MD  amiodarone (PACERONE) 200 MG tablet Take 0.5 tablets (100 mg total) by mouth daily. 04/19/15  Yes Larey Dresser, MD  buPROPion (WELLBUTRIN) 75 MG tablet Take 1 tablet (75 mg total) by mouth 2 (two) times daily. 06/10/15  Yes Hendricks Limes, MD  ferrous sulfate 325 (65 FE) MG tablet Take 325 mg by mouth at bedtime.   Yes Historical Provider, MD  finasteride (PROSCAR) 5 MG tablet Take 1 tablet (5 mg total) by mouth daily. 12/20/12  Yes Dayna N Dunn, PA-C  furosemide (LASIX) 40 MG tablet TAKE 1 TABLET (40 MG TOTAL) BY MOUTH EVERY OTHER DAY. 09/25/14  Yes Larey Dresser, MD  l-methylfolate-B6-B12 (METANX) 3-35-2 MG TABS tablet TAKE 1 TABLET BY MOUTH 2 (TWO) TIMES DAILY. 10/20/15  Yes Tamala Fothergill Regal, DPM  lisinopril (PRINIVIL,ZESTRIL) 5 MG tablet TAKE ONE TABLET BY MOUTH DAILY 12/30/15  Yes Larey Dresser, MD  Meth-Hyo-M Bl-Na Phos-Ph Sal (URIBEL PO) Take 1 capsule by mouth daily as needed (bladder spasm).    Yes Historical Provider, MD  mirabegron ER (MYRBETRIQ) 25 MG TB24 tablet Take 25 mg by mouth daily.   Yes Historical Provider, MD  Multiple Vitamin (MULTIVITAMIN) tablet Take 1 tablet by mouth daily.   Yes Historical Provider, MD  pantoprazole (PROTONIX) 40 MG tablet Take 1 tablet (40 mg total) by mouth 2 (two) times daily before a meal. After one month take 1 tablet  daily. Patient taking differently: Take 40 mg by mouth daily. After one month take 1 tablet daily. 10/28/15  Yes Costin Karlyne Greenspan, MD  pravastatin (PRAVACHOL) 80 MG tablet TAKE 1 TABLET (80 MG TOTAL) BY MOUTH DAILY. 07/13/15  Yes Larey Dresser, MD  Probiotic Product (ALIGN) 4 MG CAPS Take 1 capsule by mouth daily.    Yes Historical Provider, MD  traZODone (DESYREL) 50 MG tablet Take 50 mg by mouth at bedtime. 01/11/16  Yes Historical Provider, MD  warfarin (COUMADIN) 2.5 MG tablet TAKE AS DIRECTED BY ANTICOAGULATION CLINIC 02/17/15  Yes Larey Dresser, MD  zolpidem (AMBIEN) 5 MG tablet Take 2.5 mg by mouth at bedtime as needed for sleep.  12/14/15  Yes Historical Provider, MD  levofloxacin (LEVAQUIN) 500 MG tablet Take 1 tablet (500 mg total) by mouth daily. Patient not taking: Reported on  01/14/2016 10/28/15   Costin Karlyne Greenspan, MD    Current Facility-Administered Medications  Medication Dose Route Frequency Provider Last Rate Last Dose  . 0.9 %  sodium chloride infusion   Intravenous Continuous Toy Baker, MD 75 mL/hr at 01/14/16 2312    . acetaminophen (TYLENOL) tablet 650 mg  650 mg Oral Q6H PRN Toy Baker, MD       Or  . acetaminophen (TYLENOL) suppository 650 mg  650 mg Rectal Q6H PRN Toy Baker, MD      . amiodarone (PACERONE) tablet 100 mg  100 mg Oral Daily Toy Baker, MD   100 mg at 01/15/16 0956  . buPROPion [facility]) tablet 75 mg  75 mg Oral BID Toy Baker, MD   75 mg at 01/15/16 0956  . cefTRIAXone (ROCEPHIN) 1 g in dextrose 5 % 50 mL IVPB  1 g Intravenous Q24H Toy Baker, MD      . Chlorhexidine Gluconate Cloth 2 % PADS 6 each  6 each Topical Q0600 Toy Baker, MD      . HYDROcodone-acetaminophen (NORCO/VICODIN) 5-325 MG per tablet 1-2 tablet  1-2 tablet Oral Q4H PRN Toy Baker, MD   1 tablet at 01/14/16 2313  . mirabegron ER (MYRBETRIQ) tablet 25 mg  25 mg Oral Daily Toy Baker, MD   25 mg at 01/15/16 0956  .  mupirocin ointment (BACTROBAN) 2 % 1 application  1 application Nasal BID Toy Baker, MD   1 application at [id number]  . ondansetron (ZOFRAN) tablet 4 mg  4 mg Oral Q6H PRN Toy Baker, MD       Or  . ondansetron (ZOFRAN) injection 4 mg  4 mg Intravenous Q6H PRN Toy Baker, MD      . pantoprazole (PROTONIX) EC tablet 40 mg  40 mg Oral BID AC Toy Baker, MD   40 mg at 01/15/16 0856  . pravastatin (PRAVACHOL) tablet 80 mg  80 mg Oral Daily Toy Baker, MD   80 mg at 01/15/16 0956  . sodium chloride flush (NS) 0.9 % injection 3 mL  3 mL Intravenous Q12H Toy Baker, MD   3 mL at 01/14/16 2312  . traZODone (DESYREL) tablet 50 mg  50 mg Oral QHS Toy Baker, MD   50 mg at 01/14/16 2313  . zolpidem (AMBIEN) tablet 2.5 mg  2.5 mg Oral QHS PRN Toy Baker, MD        Allergies as of 01/14/2016 - Review Complete 01/14/2016  Allergen Reaction Noted  . Darifenacin hydrobromide  08/31/2010  . Fluoxetine hcl      Family History  Problem Relation Age of Onset  . Heart attack Father   . Heart failure Father 89    S/P Turp  . Heart disease Father 80    MI.Marland KitchenMarland KitchenCHF S/P TURP @ 19  . Stroke Brother 17  . Coronary artery disease Brother   . Lung cancer Brother     Social History   Social History  . Marital Status: Married    Spouse Name: N/A  . Number of Children: N/A  . Years of Education: N/A   Occupational History  . Not on file.   Social History Main Topics  . Smoking status: Former Smoker    Quit date: 08/22/1983  . Smokeless tobacco: Not on file  . Alcohol Use: No  . Drug Use: No  . Sexual Activity: Not on file   Other Topics Concern  . Not on file   Social History Narrative   MARRIED  REGULAR EXERCISE--YES WALKS 15 MINS 4-5X WK    Review of Systems: Denies chest pain, shortness of breath, abdominal pain at present, hematochezia, or recent change in bowel habits. Admits to chronic constipation. The patient has  diminished ambulation because his legs are "weak," states that he can walk from his bedroom to the living room at home, but is pretty much bed confined, does not go to the bathroom for toileting but instead uses incontinence briefs.he states he is working with physical therapy.   Physical Exam: Vital signs in last 24 hours: Temp:  [98.4 F (36.9 C)-100 F (37.8 C)] 99.2 F (37.3 C) (05/27 0532) Pulse Rate:  [67-74] 74 (05/27 0532) Resp:  [16-20] 20 (05/27 0532) BP: (98-116)/(44-61) 116/54 mmHg (05/27 0532) SpO2:  [94 %-100 %] 97 % (05/27 0532) Weight:  [75.7 kg (166 lb 14.2 oz)-79.379 kg (175 lb)] 75.7 kg (166 lb 14.2 oz) (05/26 2211)   General:   Alert,  somewhat frail-appearing elderly man who is hard of hearing, but in no distress and appears cognitively intact area Head:  Normocephalic and atraumatic. Eyes:  Sclera clear, no icterus.   Conjunctiva pink. Neck:   No masses or thyromegaly. Lungs:  Clear throughout to auscultation.   No wheezes, crackles, or rhonchi. No evident respiratory distress. Heart:  Slightly irregular rhythm; no murmurs, clicks, rubs,  or gallops. Abdomen: Normal bowel sounds, no obvious mass effect or tenderness, no organomegaly.   Msk:   Feet are turned inward. Pulses:  Normal radial pulse is noted. Extremities:   Without edema. Neurologic:  Alert and coherent;  grossly normal neurologically. Skin:  Intact without significant lesions or rashes. Cervical Nodes:  No significant cervical adenopathy. Psych:   Alert and cooperative. Normal mood and affect.  Intake/Output from previous day: 05/26 0701 - 05/27 0700 In: -  Out: 425 [Urine:425] Intake/Output this shift:    Lab Results:  Recent Labs  01/14/16 2247 01/15/16 0520 01/15/16 1038  WBC 19.7* 13.0* 10.1  HGB 9.6* 9.3* 8.9*  HCT 29.7* 29.4* 27.8*  PLT 240 207 178   BMET  Recent Labs  01/14/16 1600 01/15/16 0520  NA 134* 138  K 4.5 4.3  CL 99* 105  CO2 27 25  GLUCOSE 127* 102*  BUN  41* 40*  CREATININE 1.81* 1.64*  CALCIUM 8.8* 8.7*   LFT  Recent Labs  01/14/16 1600 01/15/16 0520  PROT 7.0 6.2*  ALBUMIN 3.1* 2.8*  AST 33 28  ALT 37 30  ALKPHOS 141* 119  BILITOT 0.7 0.6  BILIDIR <0.1*  --   IBILI NOT CALCULATED  --    PT/INR  Recent Labs  01/14/16 1600 01/15/16 0520  LABPROT 25.9* 28.8*  INR 2.40* 2.76*    Studies/Results: Ct Abdomen Pelvis Wo Contrast  01/14/2016  CLINICAL DATA:  Hematuria EXAM: CT ABDOMEN AND PELVIS WITHOUT CONTRAST TECHNIQUE: Multidetector CT imaging of the abdomen and pelvis was performed following the standard protocol without IV contrast. COMPARISON:  None. FINDINGS: Lung bases demonstrate some mild scarring in the lower lobes. No acute infiltrate is noted. The liver demonstrates a geographic area of decreased attenuation measuring approximately 9 x 7.4 cm in dimension. This is consistent with a neoplasm till proven otherwise and may represent either primary or secondary neoplasm. The gallbladder has been surgically removed. The spleen, adrenal glands and pancreas are within normal limits. The kidneys are well visualized bilaterally. Multiple right renal cysts are seen. The largest of these lies in the upper pole measuring approximately 7.2 cm.  A renal pelvic stone is noted on the left which measures approximately 11 mm. No obstructive changes are seen. The bladder is decompressed by Foley catheter. No definitive mass lesion is seen although evaluation is limited. Some air is noted related to the instrumentation. Fecal material is noted throughout the colon. Scattered diverticular changes noted without definitive diverticulitis. The appendix is within normal limits. In the mid sigmoid colon there is a 4.3 by 2.7 cm soft tissue lesion identified. It is best visualized on image number 65 of series 3 and image number 80 of series 4. This is highly suspicious for an underlying colonic neoplasm given the changes in the liver. Diffuse aortoiliac  calcifications are seen without aneurysmal dilatation. No other focal abnormality is noted. IMPRESSION: Changes suggestive of a sigmoid neoplasm with associated liver metastatic disease. Further workup is recommended. Left renal pelvic stone without obstructive change. Renal cystic change. Diverticulosis without diverticulitis. Electronically Signed   By: Inez Catalina M.D.   On: 01/14/2016 18:41   Dg Abd Acute W/chest  01/14/2016  CLINICAL DATA:  Hematuria.  Generalized abdominal pain. EXAM: DG ABDOMEN ACUTE W/ 1V CHEST COMPARISON:  Chest x-ray 10/26/2015. FINDINGS: Heart is borderline in size. No confluent airspace opacities, effusions or edema. Nonobstructive bowel gas pattern. No free air. Prior cholecystectomy. Moderate stool burden throughout the colon. No suspicious calcification. No acute bony abnormality. IMPRESSION: Moderate stool burden in the colon.  No obstruction or free air. Prior cholecystectomy. Borderline heart size.  No active cardiopulmonary disease. Electronically Signed   By: Rolm Baptise M.D.   On: 01/14/2016 15:47    Impression: 1. Sigmoid mass with probable large hepatic metastasis on CT 2. Status post colonoscopic removal of malignant polyp with a clear margin in March 2007, grossly negative surveillance exam in June 2007, subsequently lost to follow-up. 3.  Chronic constipation, with radiographic evidence of fecal obstipation but no high-grade bowel obstruction clinically or radiographically. 4. Atrial fibrillation, chronic anticoagulation, ejection fraction 30-35% in 2014. 5. Chronic debility with limited ambulation  Plan: Begin bowel prep in anticipation of attempted sigmoidoscopy using ultraslim colonoscope in the next several days after his INR has come down somewhat closer to the normal range. The intent would be to biopsy the mass, if confirmed to be present, and to assess luminal aperture to determine degree of obstruction. If cancer is confirmed to be present, options  would include placement of a sigmoid stent or a diverting colostomy, not likely resection because extensive hepatic metastasis is felt to be present.    LOS: 1 day   [place] V  01/15/2016, 11:45 AM   Pager [phone] If no answer or after 5 PM call [phone]

## 2016-01-16 LAB — BLOOD CULTURE ID PANEL (REFLEXED)
Acinetobacter baumannii: NOT DETECTED
CANDIDA ALBICANS: NOT DETECTED
CANDIDA GLABRATA: NOT DETECTED
CANDIDA PARAPSILOSIS: NOT DETECTED
CANDIDA TROPICALIS: NOT DETECTED
Candida krusei: NOT DETECTED
Carbapenem resistance: NOT DETECTED
ENTEROBACTER CLOACAE COMPLEX: NOT DETECTED
ENTEROCOCCUS SPECIES: NOT DETECTED
Enterobacteriaceae species: NOT DETECTED
Escherichia coli: NOT DETECTED
HAEMOPHILUS INFLUENZAE: NOT DETECTED
KLEBSIELLA PNEUMONIAE: NOT DETECTED
Klebsiella oxytoca: NOT DETECTED
LISTERIA MONOCYTOGENES: NOT DETECTED
METHICILLIN RESISTANCE: DETECTED — AB
Neisseria meningitidis: NOT DETECTED
PROTEUS SPECIES: NOT DETECTED
Pseudomonas aeruginosa: NOT DETECTED
SERRATIA MARCESCENS: NOT DETECTED
STAPHYLOCOCCUS AUREUS BCID: DETECTED — AB
STREPTOCOCCUS PNEUMONIAE: NOT DETECTED
STREPTOCOCCUS PYOGENES: NOT DETECTED
Staphylococcus species: NOT DETECTED
Streptococcus agalactiae: NOT DETECTED
Streptococcus species: NOT DETECTED
VANCOMYCIN RESISTANCE: NOT DETECTED

## 2016-01-16 LAB — BASIC METABOLIC PANEL
Anion gap: 5 (ref 5–15)
BUN: 22 mg/dL — ABNORMAL HIGH (ref 6–20)
CALCIUM: 8.2 mg/dL — AB (ref 8.9–10.3)
CO2: 23 mmol/L (ref 22–32)
CREATININE: 1.07 mg/dL (ref 0.61–1.24)
Chloride: 110 mmol/L (ref 101–111)
GFR calc Af Amer: >60 mL/min (ref >60)
GFR calc non Af Amer: 59 mL/min — ABNORMAL LOW (ref >60)
GLUCOSE: 93 mg/dL (ref 65–99)
Potassium: 4.1 mmol/L (ref 3.5–5.1)
Sodium: 138 mmol/L (ref 135–145)

## 2016-01-16 LAB — CBC
HEMATOCRIT: 25 % — AB (ref 39.0–52.0)
HEMOGLOBIN: 8 g/dL — AB (ref 13.0–17.0)
MCH: 29.5 pg (ref 26.0–34.0)
MCHC: 32 g/dL (ref 30.0–36.0)
MCV: 92.3 fL (ref 78.0–100.0)
PLATELETS: 149 10*3/uL — AB (ref 150–400)
RBC: 2.71 MIL/uL — ABNORMAL LOW (ref 4.22–5.81)
RDW: 17.6 % — AB (ref 11.5–15.5)
WBC: 6.1 10*3/uL (ref 4.0–10.5)

## 2016-01-16 LAB — PROTIME-INR
INR: 2.4 — ABNORMAL HIGH (ref 0.00–1.49)
PROTHROMBIN TIME: 25.9 s — AB (ref 11.6–15.2)

## 2016-01-16 LAB — PREPARE RBC (CROSSMATCH)

## 2016-01-16 LAB — ABO/RH: ABO/RH(D): O POS

## 2016-01-16 MED ORDER — VANCOMYCIN HCL IN DEXTROSE 750-5 MG/150ML-% IV SOLN
750.0000 mg | Freq: Two times a day (BID) | INTRAVENOUS | Status: DC
  Administered 2016-01-17 – 2016-01-19 (×5): 750 mg via INTRAVENOUS
  Filled 2016-01-16 (×5): qty 150

## 2016-01-16 MED ORDER — PEG 3350-KCL-NA BICARB-NACL 420 G PO SOLR
4000.0000 mL | Freq: Once | ORAL | Status: AC
  Administered 2016-01-16: 4000 mL via ORAL

## 2016-01-16 MED ORDER — VANCOMYCIN HCL 10 G IV SOLR
1500.0000 mg | Freq: Once | INTRAVENOUS | Status: AC
  Administered 2016-01-16: 1500 mg via INTRAVENOUS
  Filled 2016-01-16: qty 1500

## 2016-01-16 MED ORDER — SODIUM CHLORIDE 0.9 % IV SOLN: Freq: Once | INTRAVENOUS | Status: DC

## 2016-01-16 MED ORDER — DEXTROSE 5 % IV SOLN
2.0000 g | INTRAVENOUS | Status: DC
  Administered 2016-01-16: 2 g via INTRAVENOUS
  Filled 2016-01-16: qty 2

## 2016-01-16 NOTE — Progress Notes (Signed)
PHARMACY - PHYSICIAN COMMUNICATION CRITICAL VALUE ALERT - BLOOD CULTURE IDENTIFICATION (BCID)  Results for orders placed or performed during the hospital encounter of 01/14/16  Blood Culture ID Panel (Reflexed) (Collected: 01/14/2016  9:09 PM)  Result Value Ref Range   Enterococcus species NOT DETECTED NOT DETECTED   Vancomycin resistance NOT DETECTED NOT DETECTED   Listeria monocytogenes NOT DETECTED NOT DETECTED   Staphylococcus species NOT DETECTED NOT DETECTED   Staphylococcus aureus DETECTED (A) NOT DETECTED   Methicillin resistance DETECTED (A) NOT DETECTED   Streptococcus species NOT DETECTED NOT DETECTED   Streptococcus agalactiae NOT DETECTED NOT DETECTED   Streptococcus pneumoniae NOT DETECTED NOT DETECTED   Streptococcus pyogenes NOT DETECTED NOT DETECTED   Acinetobacter baumannii NOT DETECTED NOT DETECTED   Enterobacteriaceae species NOT DETECTED NOT DETECTED   Enterobacter cloacae complex NOT DETECTED NOT DETECTED   Escherichia coli NOT DETECTED NOT DETECTED   Klebsiella oxytoca NOT DETECTED NOT DETECTED   Klebsiella pneumoniae NOT DETECTED NOT DETECTED   Proteus species NOT DETECTED NOT DETECTED   Serratia marcescens NOT DETECTED NOT DETECTED   Carbapenem resistance NOT DETECTED NOT DETECTED   Haemophilus influenzae NOT DETECTED NOT DETECTED   Neisseria meningitidis NOT DETECTED NOT DETECTED   Pseudomonas aeruginosa NOT DETECTED NOT DETECTED   Candida albicans NOT DETECTED NOT DETECTED   Candida glabrata NOT DETECTED NOT DETECTED   Candida krusei NOT DETECTED NOT DETECTED   Candida parapsilosis NOT DETECTED NOT DETECTED   Candida tropicalis NOT DETECTED NOT DETECTED    Name of physician (or Provider) Contacted: Lora Harduk  Changes to prescribed antibiotics required: Vancomycin, discontinue Rocephin  Minda Ditto 01/16/2016  7:40 PM

## 2016-01-16 NOTE — Progress Notes (Signed)
Pharmacy: Re- Ceftriaxone  Patient's an 82 y.o. with chronic indwelling cath presented to the ED on 5/26 with hematuria. Abd CT showed changes suggestive of sigmoid neoplasm with associated liver metastatic disease. Home warfarin currently on hold for potential sigmoidoscopy and biopsy.  Ceftriaxone started on admission for suspected UTI.  1/2 blood cultures now with [facility] in clusters.  Plan: - will increase ceftriaxone to 2 gm IV q24h for r/o bacteremia. - f/u culture  Dia Sitter, PharmD, BCPS 01/16/2016 8:13 AM

## 2016-01-16 NOTE — Progress Notes (Signed)
PROGRESS NOTE    [name]  [id number] DOB: 1925-01-14 DOA: 01/14/2016 PCP: No primary care provider on file.  Brief Narrative: [name] is an 82 year old gentleman currently resides in the community with family members, having history of dyslipidemia, hypertension, atrial fibrillation, coronary artery disease, admitted to the medicine service on 01/14/2016 with complaints of hematuria. He is currently anticoagulated with warfarin for history of atrial fibrillation. Workup in the emergency department included a CT scan of abdomen and pelvis without contrast that revealed 4.3 x 2.7 cm mass in the sigmoid colon concerning for malignancy. Radiology also pointed out hepatic lesions could be related to metastatic disease. GI consulted for possible colonoscopy.   Assessment & Plan:   Active Problems:   Essential hypertension   UTI (lower urinary tract infection)   CAD (coronary artery disease)   Chronic systolic CHF (congestive heart failure) (HCC)   Long term (current) use of anticoagulants   Pressure ulcer   Dehydration   Acute renal failure (ARF) (HCC)   Hematuria   Chronic atrial fibrillation ([place])   Metastasis to liver ([place])  1.  Suspected metastatic colon cancer. -[name] is an 82 year old gentleman having his last colonoscopy in 2007 at which time had removal of malignant polyp. Surveillance exam later that ear unremarkable. -CT scan of abdomen and pelvis performed in the emergency room showed a 4.3 x 2.7 cm mass involving sigmoid colon along with liver lesions that could represent metastatic disease. - I spoke with Dr Cristina Gong on 01/16/2016, applied to administer Golytely bowel prep and possibly proceed with sigmoidoscopy on Tuesday. This will allow time for INR to trend down.  2.  Hematuria. -Initially presented with complaints of hematuria overnight. He is anticoagulated with warfarin presented with INR of 2.76 and PTT of 28.8. -CT showed left renal pelvic stone without  obstructive changes. -Urine analysis consistent with urinary tract infection. -Continue empiric IV antibiotic therapy with ceftriaxone 1 g IV every 24 hours, follow-up on urine cultures. -On my evaluation this morning urine appeared clear up. Has green color to it I suspect related to food pigments  3.  History of atrial fibrillation -CHADSVASC score 5 -Has been anticoagulated with warfarin. Warfarin held since CT scan revealed sigmoid mass and will require sigmoidoscopy with biopsy -Continue amiodarone 100 mg by mouth daily  4.  Dyslipidemia. -Continue pravastatin 80 mg by mouth daily  5.  Acute kidney injury. -Creatinine trending down to 1.07 from 1.64 after IV fluid resuscitation  6.  Acute blood loss anemia. -Likely secondary to hematuria and possibly colon cancer. Lab work as revealed a downward trend in his hemoglobin to 8.0 from 9.62 days ago. -Will type and cross and transfuse 1 unit of packed red blood cells today.  7.  Urinary tract infection. -Urinalysis revealed the presence of many bacteria with leukocytes. -Urine cultures pending -Continue empiric IV antibiotic therapy with ceftriaxone  DVT prophylaxis: He is fully anticoagulated also has SCDs Code Status: Full code Family Communication: I spoke to his daughter Disposition Plan: Will treat urinary tract infection, provide IV fluids, GI consulted for sigmoidoscopy   Consultants:   GI   Procedures:   Antimicrobials:   Ceftriaxone 1 g IV every 24 hours  Subjective: [name] is awake and alert, no acute distress, has no complaints for me  Objective: Filed Vitals:   01/15/16 1600 01/15/16 2131 01/16/16 0436 01/16/16 0440  BP: 108/53 103/48  113/54  Pulse: 68 63  63  Temp: 98.5 F (36.9 C) 99.2 F (  37.3 C)  99.3 F (37.4 C)  TempSrc: Oral Oral  Oral  Resp: 20 18  18   Height:      Weight:   77.8 kg (171 lb 8.3 oz)   SpO2: 98% 97%  94%    Intake/Output Summary (Last 24 hours) at 01/16/16 1339 Last  data filed at 01/16/16 0700  Gross per 24 hour  Intake   2435 ml  Output   1425 ml  Net   1010 ml   Filed Weights   01/14/16 1429 01/14/16 2211 01/16/16 0436  Weight: 79.379 kg (175 lb) 75.7 kg (166 lb 14.2 oz) 77.8 kg (171 lb 8.3 oz)    Examination:  General exam: Elderly gentleman, no acute distress Respiratory system: Clear to auscultation. Respiratory effort normal. Cardiovascular system: S1 & S2 heard, RRR. No JVD, murmurs, rubs, gallops or clicks. No pedal edema. Gastrointestinal system: Abdomen is nondistended, soft and nontender. No organomegaly or masses felt. Normal bowel sounds heard. Central nervous system: Alert and oriented. No focal neurological deficits. Extremities: Symmetric 5 x 5 power. Skin: No rashes, lesions or ulcers Psychiatry: He may be mildly confused    Data Reviewed: I have personally reviewed following labs and imaging studies  CBC:  Recent Labs Lab 01/14/16 1600 01/14/16 2247 01/15/16 0520 01/15/16 1038 01/16/16 0518  WBC 29.8* 19.7* 13.0* 10.1 6.1  NEUTROABS  --  17.7*  --   --   --   HGB 10.4* 9.6* 9.3* 8.9* 8.0*  HCT 32.5* 29.7* 29.4* 27.8* 25.0*  MCV 91.0 89.5 91.6 91.7 92.3  PLT 277 240 207 178 [id number]*   Basic Metabolic Panel:  Recent Labs Lab 01/14/16 1600 01/15/16 0520 01/16/16 0518  NA 134* 138 138  K 4.5 4.3 4.1  CL 99* 105 110  CO2 27 25 23   GLUCOSE 127* 102* 93  BUN 41* 40* 22*  CREATININE 1.81* 1.64* 1.07  CALCIUM 8.8* 8.7* 8.2*  MG  --  2.1  --   PHOS  --  4.0  --    GFR: Estimated Creatinine Clearance: 51.4 mL/min (by C-G formula based on Cr of 1.07). Liver Function Tests:  Recent Labs Lab 01/14/16 1600 01/15/16 0520  AST 33 28  ALT 37 30  ALKPHOS 141* 119  BILITOT 0.7 0.6  PROT 7.0 6.2*  ALBUMIN 3.1* 2.8*    Recent Labs Lab 01/14/16 1600  LIPASE 20   No results for input(s): AMMONIA in the last 168 hours. Coagulation Profile:  Recent Labs Lab 01/13/16 01/14/16 1600 01/15/16 0520  01/16/16 0518  INR 2.7 2.40* 2.76* 2.40*   Cardiac Enzymes: No results for input(s): CKTOTAL, CKMB, CKMBINDEX, TROPONINI in the last 168 hours. BNP (last 3 results) No results for input(s): PROBNP in the last 8760 hours. HbA1C: No results for input(s): HGBA1C in the last 72 hours. CBG: No results for input(s): GLUCAP in the last 168 hours. Lipid Profile: No results for input(s): CHOL, HDL, LDLCALC, TRIG, CHOLHDL, LDLDIRECT in the last 72 hours. Thyroid Function Tests:  Recent Labs  01/15/16 0520  TSH 1.610   Anemia Panel: No results for input(s): VITAMINB12, FOLATE, FERRITIN, TIBC, IRON, RETICCTPCT in the last 72 hours. Sepsis Labs:  Recent Labs Lab 01/14/16 1737  LATICACIDVEN 2.24*    Recent Results (from the past 240 hour(s))  Urine culture     Status: None (Preliminary result)   Collection Time: 01/14/16  4:05 PM  Result Value Ref Range Status   Specimen Description URINE, CLEAN CATCH  Final  Special Requests NONE  Final   Culture   Final    CULTURE REINCUBATED FOR BETTER GROWTH Performed at [facility]    Report Status PENDING  Incomplete  Culture, blood (Routine X 2) w Reflex to ID Panel     Status: None (Preliminary result)   Collection Time: 01/14/16  5:25 PM  Result Value Ref Range Status   Specimen Description BLOOD LEFT ANTECUBITAL  Final   Special Requests BOTTLES DRAWN AEROBIC AND ANAEROBIC 5CC  Final   Culture  Setup Time   Final    GRAM POSITIVE COCCI IN CLUSTERS IN BOTH AEROBIC AND ANAEROBIC BOTTLES CRITICAL RESULT CALLED TO, READ BACK BY AND VERIFIED WITH: RN J MCNULTY AT 2103 [id number] [place]    Culture   Final    GRAM POSITIVE COCCI CULTURE REINCUBATED FOR BETTER GROWTH Performed at [facility]    Report Status PENDING  Incomplete  MRSA PCR Screening     Status: Abnormal   Collection Time: 01/14/16 10:42 PM  Result Value Ref Range Status   MRSA by PCR POSITIVE (A) NEGATIVE Final    Comment:        The GeneXpert MRSA  Assay (FDA approved for NASAL specimens only), is one component of a comprehensive MRSA colonization surveillance program. It is not intended to diagnose MRSA infection nor to guide or monitor treatment for MRSA infections. RESULT CALLED TO, READ BACK BY AND VERIFIED WITH: A LAMB RN [phone] 01/15/16 A [facility]          Radiology Studies: Ct Abdomen Pelvis Wo Contrast  01/14/2016  CLINICAL DATA:  Hematuria EXAM: CT ABDOMEN AND PELVIS WITHOUT CONTRAST TECHNIQUE: Multidetector CT imaging of the abdomen and pelvis was performed following the standard protocol without IV contrast. COMPARISON:  None. FINDINGS: Lung bases demonstrate some mild scarring in the lower lobes. No acute infiltrate is noted. The liver demonstrates a geographic area of decreased attenuation measuring approximately 9 x 7.4 cm in dimension. This is consistent with a neoplasm till proven otherwise and may represent either primary or secondary neoplasm. The gallbladder has been surgically removed. The spleen, adrenal glands and pancreas are within normal limits. The kidneys are well visualized bilaterally. Multiple right renal cysts are seen. The largest of these lies in the upper pole measuring approximately 7.2 cm. A renal pelvic stone is noted on the left which measures approximately 11 mm. No obstructive changes are seen. The bladder is decompressed by Foley catheter. No definitive mass lesion is seen although evaluation is limited. Some air is noted related to the instrumentation. Fecal material is noted throughout the colon. Scattered diverticular changes noted without definitive diverticulitis. The appendix is within normal limits. In the mid sigmoid colon there is a 4.3 by 2.7 cm soft tissue lesion identified. It is best visualized on image number 65 of series 3 and image number 80 of series 4. This is highly suspicious for an underlying colonic neoplasm given the changes in the liver. Diffuse aortoiliac calcifications are seen  without aneurysmal dilatation. No other focal abnormality is noted. IMPRESSION: Changes suggestive of a sigmoid neoplasm with associated liver metastatic disease. Further workup is recommended. Left renal pelvic stone without obstructive change. Renal cystic change. Diverticulosis without diverticulitis. Electronically Signed   By: Inez Catalina M.D.   On: 01/14/2016 18:41   Dg Abd Acute W/chest  01/14/2016  CLINICAL DATA:  Hematuria.  Generalized abdominal pain. EXAM: DG ABDOMEN ACUTE W/ 1V CHEST COMPARISON:  Chest x-ray 10/26/2015. FINDINGS: Heart is borderline  in size. No confluent airspace opacities, effusions or edema. Nonobstructive bowel gas pattern. No free air. Prior cholecystectomy. Moderate stool burden throughout the colon. No suspicious calcification. No acute bony abnormality. IMPRESSION: Moderate stool burden in the colon.  No obstruction or free air. Prior cholecystectomy. Borderline heart size.  No active cardiopulmonary disease. Electronically Signed   By: Rolm Baptise M.D.   On: 01/14/2016 15:47        Scheduled Meds: . amiodarone  100 mg Oral Daily  . buPROPion  75 mg Oral BID  . cefTRIAXone (ROCEPHIN)  IV  2 g Intravenous Q24H  . Chlorhexidine Gluconate Cloth  6 each Topical Q0600  . mirabegron ER  25 mg Oral Daily  . mupirocin ointment  1 application Nasal BID  . pantoprazole  40 mg Oral BID AC  . polyethylene glycol-electrolytes  4,000 mL Oral Once  . pravastatin  80 mg Oral Daily  . sodium chloride flush  3 mL Intravenous Q12H  . traZODone  50 mg Oral QHS   Continuous Infusions: . sodium chloride 75 mL/hr at 01/15/16 2312     LOS: 2 days    Time spent: 35 min    Kelvin Cellar, MD [facility] Pager [phone]  If 7PM-7AM, please contact night-coverage [email or web address] Password [email or web address] 01/16/2016, 1:39 PM

## 2016-01-16 NOTE — Progress Notes (Signed)
Pharmacy Antibiotic Follow-up Note  [name] is a 82 y.o. year-old male admitted on 01/14/2016.  The patient is currently on day 1 of Vancomycin for Bacteremia.  Assessment/Plan: Vancomycin 1500mg  x1, then 750mg   IV every 12 hours.  Goal trough 15-20 mcg/mL.  Discontinue Vancomycin  Temp (24hrs), Avg:98.7 F (37.1 C), Min:97.7 F (36.5 C), Max:99.3 F (37.4 C)   Recent Labs Lab 01/14/16 1600 01/14/16 2247 01/15/16 0520 01/15/16 1038 01/16/16 0518  WBC 29.8* 19.7* 13.0* 10.1 6.1    Recent Labs Lab 01/14/16 1600 01/15/16 0520 01/16/16 0518  CREATININE 1.81* 1.64* 1.07   Estimated Creatinine Clearance: 51.4 mL/min (by C-G formula based on Cr of 1.07).    Allergies  Allergen Reactions  . Darifenacin Hydrobromide     REACTION: constipation  . Fluoxetine Hcl     REACTION: Very Depressed while taking   Antimicrobials this admission: 5/27 CTX>> 5/28 5/28 Vancomycin >>  Levels/dose changes this admission:  Microbiology results: 5/26 bcx x2:  BCID MRSA 5/26 ucx: reincubated 5/26 MRSA PCR (+)--> chlx, bactroban  Thank you for allowing pharmacy to be a part of this patient's care.  Minda Ditto PharmD 01/16/2016 7:41 PM

## 2016-01-16 NOTE — Progress Notes (Signed)
GASTROENTEROLOGY PROGRESS NOTE  Problem:   Radiographic evidence of sigmoid mass with hepatic metastasis.  Subjective: No complaints. No pain. Reports no bowel movements on laxatives yesterday.  Objective: Abdomen soft, somewhat protuberant/adipose. Patient in no distress.  INR slightly improved.  Assessment: Essentially unchanged from yesterday.  Plan: Discussed plan with Dr. Coralyn Pear. Given the absence of stool output and the persistent elevation of INR today, I do not foresee doing sigmoidoscopy tomorrow. Instead, we will spent today and tomorrow with continued attempts at cleaning out the colon, watching for obstructive symptoms such as distention, bloating, nausea, vomiting, or abdominal discomfort, with anticipated probable sigmoidoscopy/colonoscopy on Tuesday, hopefully under propofol sedation.  Cleotis Nipper, M.D. 01/16/2016 10:07 AM  Pager [phone] If no answer or after 5 PM call [phone]

## 2016-01-17 ENCOUNTER — Inpatient Hospital Stay (HOSPITAL_COMMUNITY): Payer: Medicare Other

## 2016-01-17 DIAGNOSIS — Z22322 Carrier or suspected carrier of Methicillin resistant Staphylococcus aureus: Secondary | ICD-10-CM

## 2016-01-17 DIAGNOSIS — Z960 Presence of urogenital implants: Secondary | ICD-10-CM

## 2016-01-17 DIAGNOSIS — F0390 Unspecified dementia without behavioral disturbance: Secondary | ICD-10-CM

## 2016-01-17 DIAGNOSIS — A4101 Sepsis due to Methicillin susceptible Staphylococcus aureus: Secondary | ICD-10-CM | POA: Insufficient documentation

## 2016-01-17 DIAGNOSIS — K6389 Other specified diseases of intestine: Secondary | ICD-10-CM

## 2016-01-17 DIAGNOSIS — I2510 Atherosclerotic heart disease of native coronary artery without angina pectoris: Secondary | ICD-10-CM

## 2016-01-17 DIAGNOSIS — K769 Liver disease, unspecified: Secondary | ICD-10-CM

## 2016-01-17 DIAGNOSIS — A4902 Methicillin resistant Staphylococcus aureus infection, unspecified site: Secondary | ICD-10-CM

## 2016-01-17 DIAGNOSIS — R531 Weakness: Secondary | ICD-10-CM

## 2016-01-17 DIAGNOSIS — G3183 Dementia with Lewy bodies: Secondary | ICD-10-CM

## 2016-01-17 DIAGNOSIS — R509 Fever, unspecified: Secondary | ICD-10-CM

## 2016-01-17 DIAGNOSIS — R338 Other retention of urine: Secondary | ICD-10-CM

## 2016-01-17 DIAGNOSIS — M21372 Foot drop, left foot: Secondary | ICD-10-CM

## 2016-01-17 DIAGNOSIS — A419 Sepsis, unspecified organism: Secondary | ICD-10-CM

## 2016-01-17 DIAGNOSIS — M21371 Foot drop, right foot: Secondary | ICD-10-CM | POA: Insufficient documentation

## 2016-01-17 DIAGNOSIS — R29898 Other symptoms and signs involving the musculoskeletal system: Secondary | ICD-10-CM | POA: Insufficient documentation

## 2016-01-17 DIAGNOSIS — F0280 Dementia in other diseases classified elsewhere without behavioral disturbance: Secondary | ICD-10-CM | POA: Insufficient documentation

## 2016-01-17 DIAGNOSIS — N401 Enlarged prostate with lower urinary tract symptoms: Secondary | ICD-10-CM

## 2016-01-17 DIAGNOSIS — Z7401 Bed confinement status: Secondary | ICD-10-CM

## 2016-01-17 DIAGNOSIS — R7881 Bacteremia: Secondary | ICD-10-CM

## 2016-01-17 DIAGNOSIS — Z7189 Other specified counseling: Secondary | ICD-10-CM | POA: Insufficient documentation

## 2016-01-17 DIAGNOSIS — N179 Acute kidney failure, unspecified: Secondary | ICD-10-CM

## 2016-01-17 LAB — TYPE AND SCREEN
ABO/RH(D): O POS
ANTIBODY SCREEN: NEGATIVE
Unit division: 0

## 2016-01-17 LAB — BASIC METABOLIC PANEL
ANION GAP: 6 (ref 5–15)
BUN: 14 mg/dL (ref 6–20)
CALCIUM: 8.1 mg/dL — AB (ref 8.9–10.3)
CO2: 25 mmol/L (ref 22–32)
Chloride: 107 mmol/L (ref 101–111)
Creatinine, Ser: 0.83 mg/dL (ref 0.61–1.24)
Glucose, Bld: 94 mg/dL (ref 65–99)
Potassium: 4 mmol/L (ref 3.5–5.1)
Sodium: 138 mmol/L (ref 135–145)

## 2016-01-17 LAB — PROTIME-INR
INR: 2.07 — AB (ref 0.00–1.49)
Prothrombin Time: 23.2 seconds — ABNORMAL HIGH (ref 11.6–15.2)

## 2016-01-17 LAB — ECHOCARDIOGRAM COMPLETE
HEIGHTINCHES: 72 in
Weight: 2720 oz

## 2016-01-17 LAB — CBC
HEMATOCRIT: 29.1 % — AB (ref 39.0–52.0)
Hemoglobin: 9.5 g/dL — ABNORMAL LOW (ref 13.0–17.0)
MCH: 29.5 pg (ref 26.0–34.0)
MCHC: 32.6 g/dL (ref 30.0–36.0)
MCV: 90.4 fL (ref 78.0–100.0)
PLATELETS: 172 10*3/uL (ref 150–400)
RBC: 3.22 MIL/uL — ABNORMAL LOW (ref 4.22–5.81)
RDW: 16.6 % — AB (ref 11.5–15.5)
WBC: 7.7 10*3/uL (ref 4.0–10.5)

## 2016-01-17 MED ORDER — PERFLUTREN LIPID MICROSPHERE
1.0000 mL | INTRAVENOUS | Status: AC | PRN
  Administered 2016-01-17: 2 mL via INTRAVENOUS
  Filled 2016-01-17: qty 10

## 2016-01-17 MED ORDER — PEG 3350-KCL-NA BICARB-NACL 420 G PO SOLR: 4000.0000 mL | Freq: Once | ORAL | Status: DC

## 2016-01-17 NOTE — Progress Notes (Addendum)
PROGRESS NOTE    [name]  [id number] DOB: 1925-02-18 DOA: 01/14/2016 PCP: No primary care provider on file.  Brief Narrative: Mr. [name] is an 82 year old gentleman currently resides in the community with family members, having history of dyslipidemia, hypertension, atrial fibrillation, coronary artery disease, admitted to the medicine service on 01/14/2016 with complaints of hematuria. He is currently anticoagulated with warfarin for history of atrial fibrillation. Workup in the emergency department included a CT scan of abdomen and pelvis without contrast that revealed 4.3 x 2.7 cm mass in the sigmoid colon concerning for malignancy. Radiology also pointed out hepatic lesions could be related to metastatic disease. GI consulted for possible colonoscopy.   Assessment & Plan:   Active Problems:   Essential hypertension   UTI (lower urinary tract infection)   CAD (coronary artery disease)   Chronic systolic CHF (congestive heart failure) (HCC)   Long term (current) use of anticoagulants   Pressure ulcer   Dehydration   Acute renal failure (ARF) (HCC)   Hematuria   Chronic atrial fibrillation ([place])   Metastasis to liver ([place])  1.  Suspected metastatic colon cancer. -Mr. [name] is an 82 year old gentleman having his last colonoscopy in 2007 at which time had removal of malignant polyp. Surveillance exam later that ear unremarkable. -CT scan of abdomen and pelvis performed in the emergency room showed a 4.3 x 2.7 cm mass involving sigmoid colon along with liver lesions that could represent metastatic disease. -He may undergo sigmoidoscopy later this week once INR trends down and infection improves  2.  Positive Blood cultures -Microbiology reporting blood cultures obtained on 01/14/2016 growing methicillin-resistant Staphylococcus aureus 2 -Pharmacy was consulted overnight and started on IV vancomycin. Repeat blood cultures have been ordered. -I checked him from head to toe and  did not find a source of infection, however, it appears that his urine is growing Staphylococcus aureus and I am suspecting this to be his source of infection. -Will check a transthoracic echocardiogram to ensure there are no vegetations -Continue IV vancomycin. Had been on IV ceftriaxone which was discontinued.  3.  Hematuria. -Initially presented with complaints of hematuria. He is anticoagulated with warfarin presented with INR of 2.76 and PTT of 28.8. -CT showed left renal pelvic stone without obstructive changes. -Urine analysis consistent with urinary tract infection. -Continue empiric IV antibiotic therapy with ceftriaxone 1 g IV every 24 hours, follow-up on urine cultures. -On my evaluation this morning urine appeared clear up. Has green color to it I suspect related to food pigments  3.  History of atrial fibrillation -CHADSVASC score 5 -Has been anticoagulated with warfarin. Warfarin held since CT scan revealed sigmoid mass and will require sigmoidoscopy with biopsy -Continue amiodarone 100 mg by mouth daily  4.  Dyslipidemia. -Continue pravastatin 80 mg by mouth daily  5.  Acute kidney injury. -Creatinine trending down to 1.07 from 1.64 after IV fluid resuscitation  6.  Acute blood loss anemia. -Likely secondary to hematuria and possibly colon cancer. Lab work as revealed a downward trend in his hemoglobin to 8.0 from 9.62 days ago. -He was transfuse 1 unit of packed red blood cells on 01/16/2016 with repeat hemoglobin improving to 9.5.  7.  Urinary tract infection. -Urinalysis revealed the presence of many bacteria with leukocytes. -Urine cultures pending -Urine cultures now growing Staphylococcus aureus as blood cultures reported by microbiology to be growing methicillin-resistant Staphylococcus aureus -Antibiotic coverage changed to vancomycin  8.  Sepsis -Present on admission, evidenced by a white count  of 29,800, blood cultures now returning positive for  methicillin-resistant Staphylococcus aureus, acute kidney injury. -The source of infection likely urinary tract given urine cultures growing Staphylococcus aureus as well. -White count now trending down, he was started on IV vancomycin   DVT prophylaxis: He is fully anticoagulated also has SCDs Code Status: Full code Family Communication: I have been updating his daughter on a daily basis Disposition Plan: Will treat urinary tract infection, provide IV fluids, GI consulted for sigmoidoscopy   Consultants:   GI   Procedures:   Antimicrobials:   Ceftriaxone 1 g IV every 24 hours discontinued on 01/17/2016  Vancomycin started on 01/17/2016  Subjective: Mr. [name] is awake and alert, no acute distress, has no complaints for me  Objective: Filed Vitals:   01/16/16 2317 01/16/16 2345 01/17/16 0230 01/17/16 0559  BP: 127/60 132/59 148/65 139/63  Pulse: 59 57 59 62  Temp: 98.9 F (37.2 C) 98.9 F (37.2 C) 98.4 F (36.9 C) 98.5 F (36.9 C)  TempSrc: Oral Oral Oral Oral  Resp: 20 18  18   Height:      Weight:    77.111 kg (170 lb)  SpO2: 96% 97% 96% 95%    Intake/Output Summary (Last 24 hours) at 01/17/16 1206 Last data filed at 01/17/16 1000  Gross per 24 hour  Intake 1087.5 ml  Output   2700 ml  Net -1612.5 ml   Filed Weights   01/14/16 2211 01/16/16 0436 01/17/16 0559  Weight: 75.7 kg (166 lb 14.2 oz) 77.8 kg (171 lb 8.3 oz) 77.111 kg (170 lb)    Examination:  General exam: Elderly gentleman, no acute distress Respiratory system: Clear to auscultation. Respiratory effort normal. Cardiovascular system: S1 & S2 heard, RRR. No JVD, murmurs, rubs, gallops or clicks. No pedal edema. Gastrointestinal system: Abdomen is nondistended, soft and nontender. No organomegaly or masses felt. Normal bowel sounds heard. Central nervous system: Alert and oriented. No focal neurological deficits. Extremities: Symmetric 5 x 5 power. Skin: No rashes, lesions or ulcers Psychiatry:  He may be mildly confused    Data Reviewed: I have personally reviewed following labs and imaging studies  CBC:  Recent Labs Lab 01/14/16 2247 01/15/16 0520 01/15/16 1038 01/16/16 0518 01/17/16 0407  WBC 19.7* 13.0* 10.1 6.1 7.7  NEUTROABS 17.7*  --   --   --   --   HGB 9.6* 9.3* 8.9* 8.0* 9.5*  HCT 29.7* 29.4* 27.8* 25.0* 29.1*  MCV 89.5 91.6 91.7 92.3 90.4  PLT 240 207 178 149* [id number]   Basic Metabolic Panel:  Recent Labs Lab 01/14/16 1600 01/15/16 0520 01/16/16 0518 01/17/16 0407  NA 134* 138 138 138  K 4.5 4.3 4.1 4.0  CL 99* 105 110 107  CO2 27 25 23 25   GLUCOSE 127* 102* 93 94  BUN 41* 40* 22* 14  CREATININE 1.81* 1.64* 1.07 0.83  CALCIUM 8.8* 8.7* 8.2* 8.1*  MG  --  2.1  --   --   PHOS  --  4.0  --   --    GFR: Estimated Creatinine Clearance: 65.8 mL/min (by C-G formula based on Cr of 0.83). Liver Function Tests:  Recent Labs Lab 01/14/16 1600 01/15/16 0520  AST 33 28  ALT 37 30  ALKPHOS 141* 119  BILITOT 0.7 0.6  PROT 7.0 6.2*  ALBUMIN 3.1* 2.8*    Recent Labs Lab 01/14/16 1600  LIPASE 20   No results for input(s): AMMONIA in the last 168 hours. Coagulation Profile:  Recent  Labs Lab 01/13/16 01/14/16 1600 01/15/16 0520 01/16/16 0518 01/17/16 0407  INR 2.7 2.40* 2.76* 2.40* 2.07*   Cardiac Enzymes: No results for input(s): CKTOTAL, CKMB, CKMBINDEX, TROPONINI in the last 168 hours. BNP (last 3 results) No results for input(s): PROBNP in the last 8760 hours. HbA1C: No results for input(s): HGBA1C in the last 72 hours. CBG: No results for input(s): GLUCAP in the last 168 hours. Lipid Profile: No results for input(s): CHOL, HDL, LDLCALC, TRIG, CHOLHDL, LDLDIRECT in the last 72 hours. Thyroid Function Tests:  Recent Labs  01/15/16 0520  TSH 1.610   Anemia Panel: No results for input(s): VITAMINB12, FOLATE, FERRITIN, TIBC, IRON, RETICCTPCT in the last 72 hours. Sepsis Labs:  Recent Labs Lab 01/14/16 1737  LATICACIDVEN  2.24*    Recent Results (from the past 240 hour(s))  Urine culture     Status: Abnormal (Preliminary result)   Collection Time: 01/14/16  4:05 PM  Result Value Ref Range Status   Specimen Description URINE, CLEAN CATCH  Final   Special Requests NONE  Final   Culture >=100,000 COLONIES/mL STAPHYLOCOCCUS AUREUS (A)  Final   Report Status PENDING  Incomplete  Culture, blood (Routine X 2) w Reflex to ID Panel     Status: Abnormal (Preliminary result)   Collection Time: 01/14/16  5:25 PM  Result Value Ref Range Status   Specimen Description BLOOD LEFT ANTECUBITAL  Final   Special Requests BOTTLES DRAWN AEROBIC AND ANAEROBIC 5CC  Final   Culture  Setup Time   Final    GRAM POSITIVE COCCI IN CLUSTERS IN BOTH AEROBIC AND ANAEROBIC BOTTLES CRITICAL RESULT CALLED TO, READ BACK BY AND VERIFIED WITH: RN J MCNULTY AT 2103 [id number] [place] Performed at [facility] [facility] (A)  Final   Report Status PENDING  Incomplete  Culture, blood (Routine X 2) w Reflex to ID Panel     Status: Abnormal (Preliminary result)   Collection Time: 01/14/16  9:09 PM  Result Value Ref Range Status   Specimen Description BLOOD RIGHT ARM  Final   Special Requests BOTTLES DRAWN AEROBIC AND ANAEROBIC 5CC  Final   Culture  Setup Time   Final    GRAM POSITIVE COCCI IN CLUSTERS IN BOTH AEROBIC AND ANAEROBIC BOTTLES CRITICAL RESULT CALLED TO, READ BACK BY AND VERIFIED WITH: TERRI GREEN PHARMD 01/16/16 AT 1924 BY [place]    Culture (A)  Final    STAPHYLOCOCCUS AUREUS SUSCEPTIBILITIES TO FOLLOW Performed at [facility]    Report Status PENDING  Incomplete  Blood Culture ID Panel (Reflexed)     Status: Abnormal   Collection Time: 01/14/16  9:09 PM  Result Value Ref Range Status   Enterococcus species NOT DETECTED NOT DETECTED Final   Vancomycin resistance NOT DETECTED NOT DETECTED Final   Listeria monocytogenes NOT DETECTED NOT DETECTED Final   Staphylococcus species NOT  DETECTED NOT DETECTED Final   Staphylococcus aureus DETECTED (A) NOT DETECTED Final    Comment: CRITICAL RESULT CALLED TO, READ BACK BY AND VERIFIED WITH: TERRI GREEN PHARMD 01/16/16 AT 1924 BY [place]    Methicillin resistance DETECTED (A) NOT DETECTED Final    Comment: CRITICAL RESULT CALLED TO, READ BACK BY AND VERIFIED WITH: TERRI GREEN PHARMD 01/16/16 AT 1924 BY [place] Pie[place]    Streptococcus species NOT DETECTED NOT DETECTED Final   Streptococcus agalactiae NOT DETECTED NOT DETECTED Final   Streptococcus pneumoniae NOT DETECTED NOT DETECTED Final   Streptococcus pyogenes NOT DETECTED NOT DETECTED Final  Acinetobacter baumannii NOT DETECTED NOT DETECTED Final   Enterobacteriaceae species NOT DETECTED NOT DETECTED Final   Enterobacter cloacae complex NOT DETECTED NOT DETECTED Final   Escherichia coli NOT DETECTED NOT DETECTED Final   Klebsiella oxytoca NOT DETECTED NOT DETECTED Final   Klebsiella pneumoniae NOT DETECTED NOT DETECTED Final   Proteus species NOT DETECTED NOT DETECTED Final   Serratia marcescens NOT DETECTED NOT DETECTED Final   Carbapenem resistance NOT DETECTED NOT DETECTED Final   Haemophilus influenzae NOT DETECTED NOT DETECTED Final   Neisseria meningitidis NOT DETECTED NOT DETECTED Final   Pseudomonas aeruginosa NOT DETECTED NOT DETECTED Final   Candida albicans NOT DETECTED NOT DETECTED Final   Candida glabrata NOT DETECTED NOT DETECTED Final   Candida krusei NOT DETECTED NOT DETECTED Final   Candida parapsilosis NOT DETECTED NOT DETECTED Final   Candida tropicalis NOT DETECTED NOT DETECTED Final    Comment: Performed at [facility]  MRSA PCR Screening     Status: Abnormal   Collection Time: 01/14/16 10:42 PM  Result Value Ref Range Status   MRSA by PCR POSITIVE (A) NEGATIVE Final    Comment:        The GeneXpert MRSA Assay (FDA approved for NASAL specimens only), is one component of a comprehensive MRSA colonization surveillance program. It is  not intended to diagnose MRSA infection nor to guide or monitor treatment for MRSA infections. RESULT CALLED TO, READ BACK BY AND VERIFIED WITH: A LAMB RN 0138 01/15/16 A [facility]          Radiology Studies: No results found.      Scheduled Meds: . sodium chloride   Intravenous Once  . amiodarone  100 mg Oral Daily  . buPROPion  75 mg Oral BID  . Chlorhexidine Gluconate Cloth  6 each Topical Q0600  . mirabegron ER  25 mg Oral Daily  . mupirocin ointment  1 application Nasal BID  . pantoprazole  40 mg Oral BID AC  . polyethylene glycol-electrolytes  4,000 mL Oral Once  . pravastatin  80 mg Oral Daily  . sodium chloride flush  3 mL Intravenous Q12H  . traZODone  50 mg Oral QHS  . vancomycin  750 mg Intravenous Q12H   Continuous Infusions:     LOS: 3 days    Time spent: 35 min    Kelvin Cellar, MD [facility] Pager [phone]  If 7PM-7AM, please contact night-coverage [email or web address] Password [facility] 01/17/2016, 12:06 PM

## 2016-01-17 NOTE — Consult Note (Signed)
WOC wound consult note Reason for Consult: partial thickness tissue loss and erythema in the sacral and coccyx areas secondary to fecal incontinence.  Moisture associated skin damage, specifically incontinence associated dermatitis, not pressure injuries Wound type:MASD Pressure Ulcer POA: No Measurement:0.2cm x 0.4cm x 0.1cm with pink, shallow, moist wound bed, scant serous exudate Wound bed:As described above Drainage (amount, consistency, odor) As described above Periwound: erythema, but blanchable Dressing procedure/placement/frequency: Nursing is provided with guidance via the orders for turning and repositioning and for floatation of heels. We will forego the sacral prophylactic dressing as patient is receiving a bowel prep at this time and instead focus on turning and repositioning and application of moisture barrier ointment following incontinence episodes. [place] nursing team will not follow, but will remain available to this patient, the nursing and medical teams.  Please re-consult if needed. Thanks, Maudie Flakes, MSN, RN, [place], Arther Abbott  Pager# [phone]

## 2016-01-17 NOTE — Consult Note (Signed)
Date of Admission:  01/14/2016  Date of Consult:  01/17/2016  Reason for Consult: MRSA bacteremia Referring Physician: "auto-consult from [place] and Dr. Coralyn Pear   HPI: [name] is an 82 y.o. male. With BPH and obstructive uropathy with chronic foley catheter who is chair and bed bound due to profound lower extremity weakness with foot drop (cause not clear to me) who came in with hematuria after several month failure to thrive. He does have per daughter some degree of dementia. He previously was seen by Dr. Linna Darner with [facility] but currently being managed by MD who come to his house along with Nursing Aids at home. Daughter also has moved to the area to be with him.  On admission a large mass found in the colon along with lesions in the liver and he is scheduled for colonoscopy.   Urine and blood cultures have been taken and are growing MRSA.  He has pressure ulcer as well.    Past Medical History  Diagnosis Date  . Anemia     nos  . History of colonic polyps   . Hyperlipidemia   . Hypertension   . Cancer ([place]) [id number]    hx of basal cell  . BPH (benign prostatic hyperplasia)   . Anxiety attack     panic   . Pre-syncope     echo 03/2010,with EF 60%,mild RV dilation,no significant abnormalities...event monitor for 3wks/episodes os sinus bradycardia with heart rate to low 40's occasionally at night(suspect while sleeping)  . Atrial fibrillation ([place])     a. Dx 11/2012 - TEE with possible LAA thrombus, started on amio/coumadin.  . Systolic CHF ([place])     a. EF 25-30% 11/2012 ?tachy-mediated. b. Not on ACEI at dc due to AKI, can consider as OP.  [name] CAD (coronary artery disease)     a. Mod CAD by cath 12/10/12 - 60% long proximal LAD, 60% prox PDA.  [name] AKI (acute kidney injury) ([place])     a. Peak Cr 4.17 11/2012 - felt to be post-obstructive.  . Urinary retention     a. Post obstructive, dc'd with foley 12/2012.  [name] Renal cyst     a. By renal US 11/2012, not  candidate for further eval.  . Thrombus of left atrial appendage     a. Possible LAA thrombus by TEE 11/2012.    Past Surgical History  Procedure Laterality Date  . Colonoscopy w/ polypectomy  2007  . Cholecystectomy    . Total knee arthroplasty    . Mohs surgery  2010  . Tee without cardioversion N/A 12/11/2012    Procedure: TRANSESOPHAGEAL ECHOCARDIOGRAM (TEE);  Surgeon: Thayer Headings, MD;  Location: [place];  Service: Cardiovascular;  Laterality: N/A;  ja/bev.  Darden Dates without cardioversion N/A 01/21/2013    Procedure: TRANSESOPHAGEAL ECHOCARDIOGRAM (TEE);  Surgeon: Larey Dresser, MD;  Location: [place];  Service: Cardiovascular;  Laterality: N/A;  . Cardioversion N/A 01/21/2013    Procedure: CARDIOVERSION;  Surgeon: Larey Dresser, MD;  Location: [place];  Service: Cardiovascular;  Laterality: N/A;  . Left heart catheterization with coronary angiogram N/A 12/10/2012    Procedure: LEFT HEART CATHETERIZATION WITH CORONARY ANGIOGRAM;  Surgeon: Larey Dresser, MD;  Location: [facility] CATH LAB;  Service: Cardiovascular;  Laterality: N/A;  . Esophagogastroduodenoscopy N/A 10/23/2015    Procedure: ESOPHAGOGASTRODUODENOSCOPY (EGD);  Surgeon: Wilford Corner, MD;  Location: [facility] ENDOSCOPY;  Service: Endoscopy;  Laterality: N/A;    Social History:  reports that he quit smoking about 32 years ago. He does not have any smokeless tobacco history on file. He reports that he does not drink alcohol or use illicit drugs.   Family History  Problem Relation Age of Onset  . Heart attack Father   . Heart failure Father 69    S/P Turp  . Heart disease Father 7    MI.Marland KitchenMarland KitchenCHF S/P TURP @ 37  . Stroke Brother 17  . Coronary artery disease Brother   . Lung cancer Brother     Allergies  Allergen Reactions  . Darifenacin Hydrobromide     REACTION: constipation  . Fluoxetine Hcl     REACTION: Very Depressed while taking     Medications: I have reviewed patients current medications as  documented in Epic Anti-infectives    Start     Dose/Rate Route Frequency Ordered Stop   01/17/16 0800  vancomycin (VANCOCIN) IVPB 750 mg/150 ml premix     750 mg 150 mL/hr over 60 Minutes Intravenous Every 12 hours 01/16/16 1947     01/16/16 2000  vancomycin (VANCOCIN) 1,500 mg in sodium chloride 0.9 % 500 mL IVPB     1,500 mg 250 mL/hr over 120 Minutes Intravenous  Once 01/16/16 1947 01/16/16 2250   01/16/16 1000  cefTRIAXone (ROCEPHIN) 2 g in dextrose 5 % 50 mL IVPB  Status:  Discontinued     2 g 100 mL/hr over 30 Minutes Intravenous Every 24 hours 01/16/16 0814 01/16/16 1944   01/15/16 2000  cefTRIAXone (ROCEPHIN) 1 g in dextrose 5 % 50 mL IVPB  Status:  Discontinued     1 g 100 mL/hr over 30 Minutes Intravenous Every 24 hours 01/14/16 2210 01/16/16 0814   01/14/16 1915  cefTRIAXone (ROCEPHIN) 1 g in dextrose 5 % 50 mL IVPB     1 g 100 mL/hr over 30 Minutes Intravenous  Once 01/14/16 1907 01/14/16 2012         ROS: as in HPI otherwise remainder of 12 point Review of Systems is limited obtainable due to patient's confusion   Blood pressure 139/63, pulse 62, temperature 98.5 F (36.9 C), temperature source Oral, resp. rate 18, height 6' (1.829 m), weight 170 lb (77.111 kg), SpO2 95 %. General: Alert and awake, oriented  Person, place and much of what is going with his condition, HOH HEENT: anicteric sclera,  EOMI, oropharynx clear and without exudate Cardiovascular:regular rate, normal r,  no murmur rubs or gallops Pulmonary: clear to auscultation bilaterally, no wheezing, rales or rhonchi Gastrointestinal: soft nontender, nondistended, normal bowel sounds, Musculoskeletal: contracted lower exrtemity. His right TKA is clean and not warm  Skin, ecchymoses, ulcer not examined as he had just had BM Neuro: He has foot drop bilaterally and can flex and extend vs gravity but marked LE weakness   Results for orders placed or performed during the hospital encounter of 01/14/16 (from  the past 48 hour(s))  Protime-INR     Status: Abnormal   Collection Time: 01/16/16  5:18 AM  Result Value Ref Range   Prothrombin Time 25.9 (H) 11.6 - 15.2 seconds   INR 2.40 (H) 0.00 - 5.27  Basic metabolic panel     Status: Abnormal   Collection Time: 01/16/16  5:18 AM  Result Value Ref Range   Sodium 138 135 - 145 mmol/L   Potassium 4.1 3.5 - 5.1 mmol/L   Chloride 110 101 - 111 mmol/L   CO2 23 22 - 32 mmol/L   Glucose, Bld 93  65 - 99 mg/dL   BUN 22 (H) 6 - 20 mg/dL   Creatinine, Ser 1.07 0.61 - 1.24 mg/dL   Calcium 8.2 (L) 8.9 - 10.3 mg/dL   GFR calc non Af Amer 59 (L) >60 mL/min   GFR calc Af Amer >60 >60 mL/min    Comment: (NOTE) The eGFR has been calculated using the CKD EPI equation. This calculation has not been validated in all clinical situations. eGFR's persistently <60 mL/min signify possible Chronic Kidney Disease.    Anion gap 5 5 - 15  CBC     Status: Abnormal   Collection Time: 01/16/16  5:18 AM  Result Value Ref Range   WBC 6.1 4.0 - 10.5 K/uL   RBC 2.71 (L) 4.22 - 5.81 MIL/uL   Hemoglobin 8.0 (L) 13.0 - 17.0 g/dL   HCT 25.0 (L) 39.0 - 52.0 %   MCV 92.3 78.0 - 100.0 fL   MCH 29.5 26.0 - 34.0 pg   MCHC 32.0 30.0 - 36.0 g/dL   RDW 17.6 (H) 11.5 - 15.5 %   Platelets 149 (L) 150 - 400 K/uL  Prepare RBC     Status: None   Collection Time: 01/16/16  1:40 PM  Result Value Ref Range   Order Confirmation ORDER PROCESSED BY BLOOD BANK   ABO/Rh     Status: None   Collection Time: 01/16/16  1:58 PM  Result Value Ref Range   ABO/RH(D) O POS   Type and screen [place]     Status: None   Collection Time: 01/16/16  2:00 PM  Result Value Ref Range   ABO/RH(D) O POS    Antibody Screen NEG    Sample Expiration 01/19/2016    Unit Number [id number]    Blood Component Type RED CELLS,LR    Unit division 00    Status of Unit ISSUED,FINAL    Transfusion Status OK TO TRANSFUSE    Crossmatch Result Compatible   Protime-INR     Status: Abnormal     Collection Time: 01/17/16  4:07 AM  Result Value Ref Range   Prothrombin Time 23.2 (H) 11.6 - 15.2 seconds   INR 2.07 (H) 0.00 - 1.49  CBC     Status: Abnormal   Collection Time: 01/17/16  4:07 AM  Result Value Ref Range   WBC 7.7 4.0 - 10.5 K/uL    Comment: WHITE COUNT CONFIRMED ON SMEAR   RBC 3.22 (L) 4.22 - 5.81 MIL/uL   Hemoglobin 9.5 (L) 13.0 - 17.0 g/dL   HCT 29.1 (L) 39.0 - 52.0 %   MCV 90.4 78.0 - 100.0 fL   MCH 29.5 26.0 - 34.0 pg   MCHC 32.6 30.0 - 36.0 g/dL   RDW 16.6 (H) 11.5 - 15.5 %   Platelets 172 150 - 400 K/uL  Basic metabolic panel     Status: Abnormal   Collection Time: 01/17/16  4:07 AM  Result Value Ref Range   Sodium 138 135 - 145 mmol/L   Potassium 4.0 3.5 - 5.1 mmol/L   Chloride 107 101 - 111 mmol/L   CO2 25 22 - 32 mmol/L   Glucose, Bld 94 65 - 99 mg/dL   BUN 14 6 - 20 mg/dL   Creatinine, Ser 0.83 0.61 - 1.24 mg/dL   Calcium 8.1 (L) 8.9 - 10.3 mg/dL   GFR calc non Af Amer >60 >60 mL/min   GFR calc Af Amer >60 >60 mL/min    Comment: (NOTE) The eGFR  has been calculated using the CKD EPI equation. This calculation has not been validated in all clinical situations. eGFR's persistently <60 mL/min signify possible Chronic Kidney Disease.    Anion gap 6 5 - 15   @BRIEFLABTABLE (sdes,specrequest,cult,reptstatus)   ) Recent Results (from the past 720 hour(s))  Urine culture     Status: Abnormal (Preliminary result)   Collection Time: 01/14/16  4:05 PM  Result Value Ref Range Status   Specimen Description URINE, CLEAN CATCH  Final   Special Requests NONE  Final   Culture >=100,000 COLONIES/mL STAPHYLOCOCCUS AUREUS (A)  Final   Report Status PENDING  Incomplete  Culture, blood (Routine X 2) w Reflex to ID Panel     Status: Abnormal (Preliminary result)   Collection Time: 01/14/16  5:25 PM  Result Value Ref Range Status   Specimen Description BLOOD LEFT ANTECUBITAL  Final   Special Requests BOTTLES DRAWN AEROBIC AND ANAEROBIC 5CC  Final   Culture   Setup Time   Final    GRAM POSITIVE COCCI IN CLUSTERS IN BOTH AEROBIC AND ANAEROBIC BOTTLES CRITICAL RESULT CALLED TO, READ BACK BY AND VERIFIED WITH: RN J MCNULTY AT [id number] [id number] [place] Performed at [place] (A)  Final   Report Status PENDING  Incomplete  Culture, blood (Routine X 2) w Reflex to ID Panel     Status: Abnormal (Preliminary result)   Collection Time: 01/14/16  9:09 PM  Result Value Ref Range Status   Specimen Description BLOOD RIGHT ARM  Final   Special Requests BOTTLES DRAWN AEROBIC AND ANAEROBIC 5CC  Final   Culture  Setup Time   Final    GRAM POSITIVE COCCI IN CLUSTERS IN BOTH AEROBIC AND ANAEROBIC BOTTLES CRITICAL RESULT CALLED TO, READ BACK BY AND VERIFIED WITH: TERRI GREEN PHARMD 01/16/16 AT 1924 BY [place]    Culture (A)  Final    STAPHYLOCOCCUS AUREUS SUSCEPTIBILITIES TO FOLLOW Performed at [facility]    Report Status PENDING  Incomplete  Blood Culture ID Panel (Reflexed)     Status: Abnormal   Collection Time: 01/14/16  9:09 PM  Result Value Ref Range Status   Enterococcus species NOT DETECTED NOT DETECTED Final   Vancomycin resistance NOT DETECTED NOT DETECTED Final   Listeria monocytogenes NOT DETECTED NOT DETECTED Final   Staphylococcus species NOT DETECTED NOT DETECTED Final   Staphylococcus aureus DETECTED (A) NOT DETECTED Final    Comment: CRITICAL RESULT CALLED TO, READ BACK BY AND VERIFIED WITH: TERRI GREEN PHARMD 01/16/16 AT 1924 BY [place]    Methicillin resistance DETECTED (A) NOT DETECTED Final    Comment: CRITICAL RESULT CALLED TO, READ BACK BY AND VERIFIED WITH: TERRI GREEN PHARMD 01/16/16 AT 1924 BY [place]    Streptococcus species NOT DETECTED NOT DETECTED Final   Streptococcus agalactiae NOT DETECTED NOT DETECTED Final   Streptococcus pneumoniae NOT DETECTED NOT DETECTED Final   Streptococcus pyogenes NOT DETECTED NOT DETECTED Final   Acinetobacter baumannii NOT DETECTED NOT  DETECTED Final   Enterobacteriaceae species NOT DETECTED NOT DETECTED Final   Enterobacter cloacae complex NOT DETECTED NOT DETECTED Final   Escherichia coli NOT DETECTED NOT DETECTED Final   Klebsiella oxytoca NOT DETECTED NOT DETECTED Final   Klebsiella pneumoniae NOT DETECTED NOT DETECTED Final   Proteus species NOT DETECTED NOT DETECTED Final   Serratia marcescens NOT DETECTED NOT DETECTED Final   Carbapenem resistance NOT DETECTED NOT DETECTED Final   Haemophilus influenzae NOT DETECTED NOT DETECTED Final  Neisseria meningitidis NOT DETECTED NOT DETECTED Final   Pseudomonas aeruginosa NOT DETECTED NOT DETECTED Final   Candida albicans NOT DETECTED NOT DETECTED Final   Candida glabrata NOT DETECTED NOT DETECTED Final   Candida krusei NOT DETECTED NOT DETECTED Final   Candida parapsilosis NOT DETECTED NOT DETECTED Final   Candida tropicalis NOT DETECTED NOT DETECTED Final    Comment: Performed at [facility]  MRSA PCR Screening     Status: Abnormal   Collection Time: 01/14/16 10:42 PM  Result Value Ref Range Status   MRSA by PCR POSITIVE (A) NEGATIVE Final    Comment:        The GeneXpert MRSA Assay (FDA approved for NASAL specimens only), is one component of a comprehensive MRSA colonization surveillance program. It is not intended to diagnose MRSA infection nor to guide or monitor treatment for MRSA infections. RESULT CALLED TO, READ BACK BY AND VERIFIED WITH: A LAMB RN 0138 01/15/16 A [place]      Impression/Recommendation  Active Problems:   Essential hypertension   UTI (lower urinary tract infection)   CAD (coronary artery disease)   Chronic systolic CHF (congestive heart failure) (HCC)   Long term (current) use of anticoagulants   Pressure ulcer   Dehydration   Acute renal failure (ARF) (HCC)   Hematuria   Chronic atrial fibrillation (HCC)   Metastasis to liver ([place])   Sepsis ([place])   [name] is a 82 y.o. male with  Dementia, BPH,  indwelling foley catheter with colonic mass, liver lesions, FTT and MRSA bacteremia with MRSA in urine, also with longstanding LE weakness and foot drop without clear cut explanation  #1 MRSA. Given fact that cultures with [facility] did not ID this until last night and he has not been on effective antibiotics until recently this is going to end up being in the "complicated bacteremia category"     [place] Antimicrobial Management Team Staphylococcus aureus bacteremia   Staphylococcus aureus bacteremia (SAB) is associated with a high rate of complications and mortality.  Specific aspects of clinical management are critical to optimizing the outcome of patients with SAB.  Therefore, the [facility] Antimicrobial Management Team [facility]) has initiated an intervention aimed at improving the management of SAB at [facility].  To do so, Infectious Diseases physicians are providing an evidence-based consult for the management of all patients with SAB.     Yes No Comments  Perform follow-up blood cultures (even if the patient is afebrile) to ensure clearance of bacteremia [x]  []  Ordered today  Remove vascular catheter and obtain follow-up blood cultures after the removal of the catheter []  []  DO NOT PLACE PICC UNTIL BLOOD CULTURES TAKEN TODAY ARE NEGATIVE AT 4-5 DAYS  Perform echocardiography to evaluate for endocarditis (transthoracic ECHO is 40-50% sensitive, TEE is > 90% sensitive) []  []  Please keep in mind, that neither test can definitively EXCLUDE endocarditis, and that should clinical suspicion remain high for endocarditis the patient should then still be treated with an "endocarditis" duration of therapy = 6 weeks  TTE is clean. I would not pursue TEE at this point since we are already looking at 4 weeks of IV abx  Consult electrophysiologist to evaluate implanted cardiac device (pacemaker, ICD) []  []  NA  Ensure source control []  []  Have all abscesses been drained effectively? Have deep seeded  infections (septic joints or osteomyelitis) had appropriate surgical debridement?  REMOVE OLD FOLEY CATHETER AND CAN PLACE NEW ONE IF HE NEEDS IT  Investigate for "metastatic" sites of infection []  []  Does the patient have ANY symptom or physical exam finding that would suggest a deeper infection (back or neck pain that may be suggestive of vertebral osteomyelitis or epidural abscess, muscle pain that could be a symptom of pyomyositis)?  Keep in mind that for deep seeded infections MRI imaging with contrast is preferred rather than other often insensitive tests such as plain x-rays, especially early in a patient's presentation.  WHEN HE IS NOT HAVING BM frrom prep would get MRI of L spine WITH CONTRAST  Change antibiotic therapy to vancomycin []  []  Beta-lactam antibiotics are preferred for MSSA due to higher cure rates.   If on Vancomycin, goal trough should be 15 - 20 mcg/mL  Estimated duration of IV antibiotic therapy:  4-8 weeks depending upon whether he has evidence of of spine infection []  []  Consult case management for probably prolonged outpatient IV antibiotic therapy    #2 Colon mass: to have biopsy  #3 LE weakness: would get MRI to evaluate cause  #4 Goals of care: would ask Palliative Care to see patient and daughter are open to this.    01/17/2016, 1:06 PM   Thank you so much for this interesting consult  [place] for [place] [phone] (pager) [phone] (office) 01/17/2016, 1:06 PM  Rhina [place] 01/17/2016, 1:06 PM

## 2016-01-17 NOTE — Progress Notes (Signed)
Utilization review completed.  

## 2016-01-17 NOTE — Progress Notes (Signed)
Patient slowly taking golytely ,  had a total of 1723ml intake   and had a large BM x4 this shift.  Will endorsed accordingly.

## 2016-01-17 NOTE — Progress Notes (Signed)
Echocardiogram 2D Echocardiogram has been performed.  [name] 01/17/2016, 11:44 AM

## 2016-01-17 NOTE — Progress Notes (Signed)
GASTROENTEROLOGY PROGRESS NOTE  Problem: Radiographic evidence of sigmoid mass with probable large hepatic metastasis. Constipation.  Subjective: Patient still without abdominal pain. Took minimal Nulytely yesterday and was recorded to have one, large soft bowel movement. Denies nausea or vomiting.  Objective: Abdomen nondistended, somewhat adipose, soft. Patient generally coherent, perhaps mildly cognitively impaired.  INR is drifting down and is currently 2.07.  CEA pending.  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 Verl Blalock. Suspect hepatic metastasis.  Plan: More aggressive attempted bowel prep today. I think he needs this for 3 reasons:  (1) to address the significant fecal obstipation noted on his admission CT scan, which could be a consequence of chronic functional constipation and/or some low-grade obstruction from a sigmoid tumor; (2) in anticipation of sigmoidoscopic evaluation in the next day or 2; (3) in the event that a decision is made to proceed with colonic surgery, such as a diverting colostomy.  Discussed above with Dr. Coralyn Pear.  I anticipate that Dr. Paulita Fujita, who is picking up the service tomorrow, will want to speak with the patient's family as well as the patient, to address goals of care and specifically the possibility of doing a sigmoidoscopy to get a tissue diagnosis and assess luminal patency, to better establish prognosis.  If a decision is made to proceed with sigmoidoscopy, Wednesday would probably be the ideal day in terms of time on the schedule, and by then the patient's pro time would probably be very close to the normal range.  Cleotis Nipper, M.D. 01/17/2016 5:42 PM  Pager [phone] If no answer or after 5 PM call [phone]

## 2016-01-18 DIAGNOSIS — L89151 Pressure ulcer of sacral region, stage 1: Secondary | ICD-10-CM

## 2016-01-18 LAB — HIV ANTIBODY (ROUTINE TESTING W REFLEX): HIV SCREEN 4TH GENERATION: NONREACTIVE

## 2016-01-18 LAB — CBC
HCT: 29.5 % — ABNORMAL LOW (ref 39.0–52.0)
HEMOGLOBIN: 9.8 g/dL — AB (ref 13.0–17.0)
MCH: 29.9 pg (ref 26.0–34.0)
MCHC: 33.2 g/dL (ref 30.0–36.0)
MCV: 89.9 fL (ref 78.0–100.0)
Platelets: 158 10*3/uL (ref 150–400)
RBC: 3.28 MIL/uL — AB (ref 4.22–5.81)
RDW: 16.5 % — ABNORMAL HIGH (ref 11.5–15.5)
WBC: 6.3 10*3/uL (ref 4.0–10.5)

## 2016-01-18 LAB — URINE CULTURE

## 2016-01-18 LAB — CULTURE, BLOOD (ROUTINE X 2)

## 2016-01-18 LAB — C-REACTIVE PROTEIN: CRP: 7.2 mg/dL — AB (ref <1.0)

## 2016-01-18 LAB — PROTIME-INR
INR: 1.88 — ABNORMAL HIGH (ref 0.00–1.49)
PROTHROMBIN TIME: 21.5 s — AB (ref 11.6–15.2)

## 2016-01-18 LAB — SEDIMENTATION RATE: Sed Rate: 77 mm/hr — ABNORMAL HIGH (ref 0–16)

## 2016-01-18 LAB — CEA: CEA: 80.8 ng/mL — ABNORMAL HIGH (ref 0.0–4.7)

## 2016-01-18 MED ORDER — SODIUM CHLORIDE 0.9 % IV SOLN
INTRAVENOUS | Status: DC
  Administered 2016-01-18: 19:00:00 via INTRAVENOUS
  Administered 2016-01-19: 500 mL via INTRAVENOUS

## 2016-01-18 NOTE — Progress Notes (Signed)
Subjective: No new complaints, he is more confused today   Antibiotics:  Anti-infectives    Start     Dose/Rate Route Frequency Ordered Stop   01/17/16 0800  vancomycin (VANCOCIN) IVPB 750 mg/150 ml premix     750 mg 150 mL/hr over 60 Minutes Intravenous Every 12 hours 01/16/16 1947     01/16/16 2000  vancomycin (VANCOCIN) 1,500 mg in sodium chloride 0.9 % 500 mL IVPB     1,500 mg 250 mL/hr over 120 Minutes Intravenous  Once 01/16/16 1947 01/16/16 2250   01/16/16 1000  cefTRIAXone (ROCEPHIN) 2 g in dextrose 5 % 50 mL IVPB  Status:  Discontinued     2 g 100 mL/hr over 30 Minutes Intravenous Every 24 hours 01/16/16 0814 01/16/16 1944   01/15/16 2000  cefTRIAXone (ROCEPHIN) 1 g in dextrose 5 % 50 mL IVPB  Status:  Discontinued     1 g 100 mL/hr over 30 Minutes Intravenous Every 24 hours 01/14/16 2210 01/16/16 0814   01/14/16 1915  cefTRIAXone (ROCEPHIN) 1 g in dextrose 5 % 50 mL IVPB     1 g 100 mL/hr over 30 Minutes Intravenous  Once 01/14/16 1907 01/14/16 2012      Medications: Scheduled Meds: . sodium chloride   Intravenous Once  . amiodarone  100 mg Oral Daily  . buPROPion  75 mg Oral BID  . Chlorhexidine Gluconate Cloth  6 each Topical Q0600  . mirabegron ER  25 mg Oral Daily  . mupirocin ointment  1 application Nasal BID  . pantoprazole  40 mg Oral BID AC  . polyethylene glycol-electrolytes  4,000 mL Oral Once  . pravastatin  80 mg Oral Daily  . sodium chloride flush  3 mL Intravenous Q12H  . traZODone  50 mg Oral QHS  . vancomycin  750 mg Intravenous Q12H   Continuous Infusions: . sodium chloride     PRN Meds:.acetaminophen **OR** acetaminophen, HYDROcodone-acetaminophen, ondansetron **OR** ondansetron (ZOFRAN) IV, zolpidem    Objective: Weight change: -9.6 oz (-0.272 kg)  Intake/Output Summary (Last 24 hours) at 01/18/16 1714 Last data filed at 01/18/16 1300  Gross per 24 hour  Intake    940 ml  Output    600 ml  Net    340 ml   Blood  pressure 138/64, pulse 54, temperature 98.1 F (36.7 C), temperature source Oral, resp. rate 24, height 6' (1.829 m), weight 169 lb 6.4 oz (76.839 kg), SpO2 98 %. Temp:  [98.1 F (36.7 C)-98.6 F (37 C)] 98.1 F (36.7 C) (05/30 1323) Pulse Rate:  [54-64] 54 (05/30 1323) Resp:  [18-24] 24 (05/30 1323) BP: (138-148)/(64-91) 138/64 mmHg (05/30 1323) SpO2:  [96 %-98 %] 98 % (05/30 1323) Weight:  [169 lb 6.4 oz (76.839 kg)] 169 lb 6.4 oz (76.839 kg) (05/30 [id number])  Physical Exam: General: sleepy and confused HEENT: anicteric sclera, pupils reactive to light and accommodation, EOMI Cardiovascular:regular rate, normal r, no murmur rubs or gallops Pulmonary: clear to auscultation bilaterally, no wheezing, rales or rhonchi Gastrointestinal: soft nontender, nondistended, normal bowel sounds, Musculoskeletal: contracted lower exrtemity. His right TKA is clean and not warm  Skin, ecchymoses, ulcer not examined as he had just had BM, his sacral decubitus was stage 1 and has been examined by wound care no abscess or deep infection here Neuro: He has foot drop bilaterally   CBC:  CBC Latest Ref Rng 01/18/2016 01/17/2016 01/16/2016  WBC 4.0 - 10.5 K/uL 6.3 7.7 6.1  Hemoglobin 13.0 - 17.0 g/dL 9.8(L) 9.5(L) 8.0(L)  Hematocrit 39.0 - 52.0 % 29.5(L) 29.1(L) 25.0(L)  Platelets 150 - 400 K/uL 158 172 149(L)       BMET  Recent Labs  01/16/16 0518 01/17/16 0407  NA 138 138  K 4.1 4.0  CL 110 107  CO2 23 25  GLUCOSE 93 94  BUN 22* 14  CREATININE 1.07 0.83  CALCIUM 8.2* 8.1*     Liver Panel  No results for input(s): PROT, ALBUMIN, AST, ALT, ALKPHOS, BILITOT, BILIDIR, IBILI in the last 72 hours.     Sedimentation Rate  Recent Labs  01/18/16 0431  ESRSEDRATE 77*   C-Reactive Protein  Recent Labs  01/18/16 0431  CRP 7.2*    Micro Results: Recent Results (from the past 720 hour(s))  Urine culture     Status: Abnormal   Collection Time: 01/14/16  4:05 PM  Result Value Ref  Range Status   Specimen Description URINE, CLEAN CATCH  Final   Special Requests NONE  Final   Culture (A)  Final    >=100,000 COLONIES/mL METHICILLIN RESISTANT STAPHYLOCOCCUS AUREUS   Report Status 01/18/2016 FINAL  Final   Organism ID, Bacteria METHICILLIN RESISTANT STAPHYLOCOCCUS AUREUS (A)  Final      Susceptibility   Methicillin resistant staphylococcus aureus - MIC*    CIPROFLOXACIN Value in next row Resistant      RESISTANT>=4    GENTAMICIN Value in next row Sensitive      SENSITIVE<=0.5    NITROFURANTOIN Value in next row Sensitive      SENSITIVE<=16    OXACILLIN Value in next row Resistant      RESISTANT>=4    TETRACYCLINE Value in next row Sensitive      SENSITIVE<=1    VANCOMYCIN Value in next row Sensitive      SENSITIVE<=0.5    TRIMETH/SULFA Value in next row Sensitive      SENSITIVE<=10    CLINDAMYCIN Value in next row Sensitive      SENSITIVE<=0.25    RIFAMPIN Value in next row Sensitive      SENSITIVE<=0.5    * >=100,000 COLONIES/mL METHICILLIN RESISTANT STAPHYLOCOCCUS AUREUS  Culture, blood (Routine X 2) w Reflex to ID Panel     Status: Abnormal   Collection Time: 01/14/16  5:25 PM  Result Value Ref Range Status   Specimen Description BLOOD LEFT ANTECUBITAL  Final   Special Requests BOTTLES DRAWN AEROBIC AND ANAEROBIC 5CC  Final   Culture  Setup Time   Final    GRAM POSITIVE COCCI IN CLUSTERS IN BOTH AEROBIC AND ANAEROBIC BOTTLES CRITICAL RESULT CALLED TO, READ BACK BY AND VERIFIED WITH: RN J MCNULTY AT 2103 [id number] MARTINB    Culture (A)  Final    STAPHYLOCOCCUS AUREUS SUSCEPTIBILITIES PERFORMED ON PREVIOUS CULTURE WITHIN THE LAST 5 DAYS. Performed at [facility]    Report Status 01/18/2016 FINAL  Final  Culture, blood (Routine X 2) w Reflex to ID Panel     Status: Abnormal   Collection Time: 01/14/16  9:09 PM  Result Value Ref Range Status   Specimen Description BLOOD RIGHT ARM  Final   Special Requests BOTTLES DRAWN AEROBIC AND ANAEROBIC  5CC  Final   Culture  Setup Time   Final    GRAM POSITIVE COCCI IN CLUSTERS IN BOTH AEROBIC AND ANAEROBIC BOTTLES CRITICAL RESULT CALLED TO, READ BACK BY AND VERIFIED WITH: TERRI GREEN PHARMD 01/16/16 AT 1924 BY [facility] Performed at [facility]  Culture METHICILLIN RESISTANT STAPHYLOCOCCUS AUREUS (A)  Final   Report Status 01/18/2016 FINAL  Final   Organism ID, Bacteria METHICILLIN RESISTANT STAPHYLOCOCCUS AUREUS  Final      Susceptibility   Methicillin resistant staphylococcus aureus - MIC*    CIPROFLOXACIN >=8 RESISTANT Resistant     ERYTHROMYCIN >=8 RESISTANT Resistant     GENTAMICIN <=0.5 SENSITIVE Sensitive     OXACILLIN >=4 RESISTANT Resistant     TETRACYCLINE <=1 SENSITIVE Sensitive     VANCOMYCIN <=0.5 SENSITIVE Sensitive     TRIMETH/SULFA <=10 SENSITIVE Sensitive     CLINDAMYCIN <=0.25 SENSITIVE Sensitive     RIFAMPIN <=0.5 SENSITIVE Sensitive     Inducible Clindamycin NEGATIVE Sensitive     * METHICILLIN RESISTANT STAPHYLOCOCCUS AUREUS  Blood Culture ID Panel (Reflexed)     Status: Abnormal   Collection Time: 01/14/16  9:09 PM  Result Value Ref Range Status   Enterococcus species NOT DETECTED NOT DETECTED Final   Vancomycin resistance NOT DETECTED NOT DETECTED Final   Listeria monocytogenes NOT DETECTED NOT DETECTED Final   Staphylococcus species NOT DETECTED NOT DETECTED Final   Staphylococcus aureus DETECTED (A) NOT DETECTED Final    Comment: CRITICAL RESULT CALLED TO, READ BACK BY AND VERIFIED WITH: TERRI GREEN PHARMD 01/16/16 AT 1924 BY [place]    Methicillin resistance DETECTED (A) NOT DETECTED Final    Comment: CRITICAL RESULT CALLED TO, READ BACK BY AND VERIFIED WITH: TERRI GREEN PHARMD 01/16/16 AT 1924 BY [place]    Streptococcus species NOT DETECTED NOT DETECTED Final   Streptococcus agalactiae NOT DETECTED NOT DETECTED Final   Streptococcus pneumoniae NOT DETECTED NOT DETECTED Final   Streptococcus pyogenes NOT DETECTED NOT DETECTED Final    Acinetobacter baumannii NOT DETECTED NOT DETECTED Final   Enterobacteriaceae species NOT DETECTED NOT DETECTED Final   Enterobacter cloacae complex NOT DETECTED NOT DETECTED Final   Escherichia coli NOT DETECTED NOT DETECTED Final   Klebsiella oxytoca NOT DETECTED NOT DETECTED Final   Klebsiella pneumoniae NOT DETECTED NOT DETECTED Final   Proteus species NOT DETECTED NOT DETECTED Final   Serratia marcescens NOT DETECTED NOT DETECTED Final   Carbapenem resistance NOT DETECTED NOT DETECTED Final   Haemophilus influenzae NOT DETECTED NOT DETECTED Final   Neisseria meningitidis NOT DETECTED NOT DETECTED Final   Pseudomonas aeruginosa NOT DETECTED NOT DETECTED Final   Candida albicans NOT DETECTED NOT DETECTED Final   Candida glabrata NOT DETECTED NOT DETECTED Final   Candida krusei NOT DETECTED NOT DETECTED Final   Candida parapsilosis NOT DETECTED NOT DETECTED Final   Candida tropicalis NOT DETECTED NOT DETECTED Final    Comment: Performed at [facility]  MRSA PCR Screening     Status: Abnormal   Collection Time: 01/14/16 10:42 PM  Result Value Ref Range Status   MRSA by PCR POSITIVE (A) NEGATIVE Final    Comment:        The GeneXpert MRSA Assay (FDA approved for NASAL specimens only), is one component of a comprehensive MRSA colonization surveillance program. It is not intended to diagnose MRSA infection nor to guide or monitor treatment for MRSA infections. RESULT CALLED TO, READ BACK BY AND VERIFIED WITH: A LAMB RN 0138 01/15/16 A NAVARRO   Culture, blood (Routine X 2) w Reflex to ID Panel     Status: None (Preliminary result)   Collection Time: 01/16/16  9:50 PM  Result Value Ref Range Status   Specimen Description BLOOD RIGHT ANTECUBITAL  Final   Special Requests BOTTLES DRAWN  AEROBIC AND ANAEROBIC 10CC  Final   Culture   Final    NO GROWTH 1 DAY Performed at [facility]    Report Status PENDING  Incomplete  Culture, blood (Routine X 2) w Reflex to ID  Panel     Status: None (Preliminary result)   Collection Time: 01/16/16  9:51 PM  Result Value Ref Range Status   Specimen Description BLOOD RIGHT ARM  Final   Special Requests BOTTLES DRAWN AEROBIC ONLY 5CC  Final   Culture   Final    NO GROWTH 1 DAY Performed at [facility]    Report Status PENDING  Incomplete    Studies/Results: No results found.    Assessment/Plan:  INTERVAL HISTORY:  01/18/16 Foley removed, repeat blood cultures no growth   Active Problems:   Essential hypertension   UTI (lower urinary tract infection)   CAD (coronary artery disease)   Chronic systolic CHF (congestive heart failure) (HCC)   Long term (current) use of anticoagulants   Pressure ulcer   Dehydration   Acute renal failure (ARF) (HCC)   Hematuria   Chronic atrial fibrillation (HCC)   Metastasis to liver (HCC)   Sepsis (HCC)   Acute kidney injury ([place])   Colonic mass   MRSA bacteremia   Staphylococcus aureus bacteremia with sepsis (HCC)   Weakness of both lower extremities   Bilateral foot-drop   Lewy body dementia without behavioral disturbance   Goals of care, counseling/discussion    [name] is a 82 y.o. male with  Dementia, BPH, indwelling foley catheter with colonic mass, liver lesions, FTT and MRSA bacteremia with MRSA in urine, also with longstanding LE weakness and foot drop without clear cut explanation  #1 MRSA. Given fact that cultures with [facility] did not ID this until last night and he has not been on effective antibiotics until recently this is going to end up being in the "complicated bacteremia category"  [place] [facility] Staphylococcus aureus bacteremia   Staphylococcus aureus bacteremia (SAB) is associated with a high rate of complications and mortality. Specific aspects of clinical management are critical to optimizing the outcome of patients with SAB. Therefore, the [facility]  Antimicrobial Management Team [facility]) has initiated an intervention aimed at improving the management of SAB at [facility]. To do so, Infectious Diseases physicians are providing an evidence-based consult for the management of all patients with SAB.     Yes No Comments  Perform follow-up blood cultures (even if the patient is afebrile) to ensure clearance of bacteremia [X]  [ ]  No growth so far  Remove vascular catheter and obtain follow-up blood cultures after the removal of the catheter [ ]  [ ]  DO NOT PLACE PICC UNTIL BLOOD CULTURES TAKEN TODAY ARE NEGATIVE AT 4-5 DAYS  Perform echocardiography to evaluate for endocarditis (transthoracic ECHO is 40-50% sensitive, TEE is > 90% sensitive) [ ]  [ ]  Please keep in mind, that neither test can definitively EXCLUDE endocarditis, and that should clinical suspicion remain high for endocarditis the patient should then still be treated with an "endocarditis" duration of therapy = 6 weeks  TTE is clean. I would not pursue TEE at this point since we are already looking at 4 weeks of IV abx  Consult electrophysiologist to evaluate implanted cardiac device (pacemaker, ICD) [ ]  [ ]  NA  Ensure source control [ ]  [ ]  Have all abscesses been drained effectively? Have deep seeded infections (septic joints or osteomyelitis) had appropriate surgical debridement?  REMOVED OLD FOLEY CATHETER   Investigate for "metastatic" sites of infection [ ]  [ ]  Does the patient have ANY symptom or physical exam finding that would suggest a deeper infection (back or neck pain that may be suggestive of vertebral osteomyelitis or epidural abscess, muscle pain that could be a symptom of pyomyositis)?  Keep in mind that for deep seeded infections MRI imaging with contrast is preferred rather than other often insensitive tests such as plain x-rays, especially early in a patient's presentation.  WHEN HE IS NOT HAVING BM frrom prep would get MRI of L spine WITH  CONTRAST  Change antibiotic therapy to vancomycin [ ]  [ ]  Beta-lactam antibiotics are preferred for MSSA due to higher cure rates.  If on Vancomycin, goal trough should be 15 - 20 mcg/mL  Estimated duration of IV antibiotic therapy: 4-8 weeks depending upon whether he has evidence of of spine infection [ ]  [ ]  Consult case management for probably prolonged outpatient IV antibiotic therapy    #2 Colon mass: to have biopsy  #3 LE weakness: would get MRI to evaluate cause  #4 Goals of care: would ask Palliative Care to see patient and daughter are open to this.         LOS: 4 days   Alcide Evener 01/18/2016, 5:14 PM

## 2016-01-18 NOTE — Progress Notes (Signed)
PROGRESS NOTE    [name]  [id number] DOB: 1925/02/05 DOA: 01/14/2016 PCP: No primary care provider on file.  Brief Narrative: [name] is an 82 year old gentleman currently resides in the community with family members, having history of dyslipidemia, hypertension, atrial fibrillation, coronary artery disease, admitted to the medicine service on 01/14/2016 with complaints of hematuria. He is currently anticoagulated with warfarin for history of atrial fibrillation. Workup in the emergency department included a CT scan of abdomen and pelvis without contrast that revealed 4.3 x 2.7 cm mass in the sigmoid colon concerning for malignancy. Radiology also pointed out hepatic lesions could be related to metastatic disease. [facility] consulted. Patient was seen and evaluated by Dr.Buccini and Dr Oletta Lamas, plan for sigmoidoscopy/colonoscopy on Wednesday, 01/19/2016. INR has trending down. Other issues addressed during this hospitalization include methicillin-resistant Staphylococcus aureus bacteremia. Same organism grew in urine making I think that source of infection may have been MRSA urinary tract infection. Dr. Lucianne Lei dam of infectious disease was consulted. He remains on IV vancomycin. Coming in with bilateral extremity weakness Dr. Lucianne Lei dam recommended imaging is back once he got through colon prep and colonoscopy, considering the possibility of metastatic disease.  Palliative Medicine was consulted to facilitate discussions regarding his medical goals of care.    Assessment & Plan:   Active Problems:   Essential hypertension   UTI (lower urinary tract infection)   CAD (coronary artery disease)   Chronic systolic CHF (congestive heart failure) (HCC)   Long term (current) use of anticoagulants   Pressure ulcer   Dehydration   Acute renal failure (ARF) (HCC)   Hematuria   Chronic atrial fibrillation (HCC)   Metastasis to liver (HCC)   Sepsis ([place])   Acute kidney injury ([place])   Colonic mass   MRSA bacteremia   Staphylococcus aureus bacteremia with sepsis (HCC)   Weakness of both lower extremities   Bilateral foot-drop   Lewy body dementia without behavioral disturbance   Goals of care, counseling/discussion  1.  Suspected metastatic colon cancer. -[name] is an 82 year old gentleman having his last colonoscopy in 2007 at which time had removal of malignant polyp. Surveillance exam later that ear unremarkable. -CT scan of abdomen and pelvis performed in the emergency room showed a 4.3 x 2.7 cm mass involving sigmoid colon along with liver lesions that could represent metastatic disease. -He received prep with nulytely, anticipate sigmoidoscopy/colonoscopy on Wednesday, 01/19/2016  2.  Positive Blood cultures -Microbiology reporting blood cultures obtained on 01/14/2016 growing methicillin-resistant Staphylococcus aureus 2 -Pharmacy was consulted overnight and started on IV vancomycin. Repeat blood cultures have been ordered. -I checked him from head to toe and did not find a source of infection, however, it appears that his urine is growing Staphylococcus aureus and I am suspecting this to be his source of infection. -Transthoracic echocardiogram performed on 01/17/2016 showing ejection fraction of [id number], grade 2 diastolic dysfunction, without evidence of vegetations. -Continue IV vancomycin. Had been on IV ceftriaxone which was discontinued. -Repeat blood cultures on 01/08/2016 show no growth 2 sets. -Dr. Lucianne Lei dam of infectious disease consulted. He feels patient may need one month of IV antibiotic therapy  3.  Hematuria. -Initially presented with complaints of hematuria. He is anticoagulated with warfarin presented with INR of 2.76 and PTT of 28.8. -CT showed left renal pelvic stone without obstructive changes. -Urine analysis consistent with urinary tract infection. -Initially he was treated with ceftriaxone however urine cultures growing methicillin-resistant  Staphylococcus aureus, antibiotic regimen changed to IV  vancomycin. Urine clearing up.  3.  History of atrial fibrillation -CHADSVASC score 5 -Has been anticoagulated with warfarin. Warfarin held since CT scan revealed sigmoid mass and will require sigmoidoscopy with biopsy -Continue amiodarone 100 mg by mouth daily  4.  Dyslipidemia. -Continue pravastatin 80 mg by mouth daily  5.  Acute kidney injury. -Resolved with IV fluid resuscitation  6.  Acute blood loss anemia. -Likely secondary to hematuria and possibly colon cancer. Lab work as revealed a downward trend in his hemoglobin to 8.0 from 9.62 days ago. -He was transfuse 1 unit of packed red blood cells on 01/16/2016 -Hemoglobin stable at 9.8 on 01/18/2016  7.  Urinary tract infection. -Urinalysis revealed the presence of many bacteria with leukocytes. -Urine cultures pending -Urine cultures now growing Staphylococcus aureus as blood cultures reported by microbiology to be growing methicillin-resistant Staphylococcus aureus -Antibiotic coverage changed to vancomycin  8.  Sepsis -Present on admission, evidenced by a white count of 29,800, blood cultures now returning positive for methicillin-resistant Staphylococcus aureus, acute kidney injury. -The source of infection likely urinary tract given urine cultures growing Staphylococcus aureus as well. -White count now trending down, he was started on IV vancomycin. Repeat blood cultures negative.   DVT prophylaxis: He is fully anticoagulated also has SCDs Code Status: Full code Family Communication: I have been updating his daughter on a daily basis Disposition Plan: Will treat urinary tract infection, provide IV fluids, GI consulted for sigmoidoscopy on 01/19/2016   Consultants:   GI   Procedures:   Antimicrobials:   Ceftriaxone 1 g IV every 24 hours discontinued on 01/17/2016  Vancomycin started on 01/17/2016  Subjective: [name] is awake and alert, no acute  distress, has no complaints for me  Objective: Filed Vitals:   01/17/16 0559 01/17/16 2107 01/18/16 0613 01/18/16 1323  BP: 139/63 138/67 148/91 138/64  Pulse: 62 64 59 54  Temp: 98.5 F (36.9 C) 98.2 F (36.8 C) 98.6 F (37 C) 98.1 F (36.7 C)  TempSrc: Oral Oral Oral Oral  Resp: 18 18 20 24   Height:      Weight: 77.111 kg (170 lb)  76.839 kg (169 lb 6.4 oz)   SpO2: 95% 98% 96% 98%    Intake/Output Summary (Last 24 hours) at 01/18/16 1341 Last data filed at 01/18/16 1300  Gross per 24 hour  Intake    940 ml  Output    600 ml  Net    340 ml   Filed Weights   01/16/16 0436 01/17/16 0559 01/18/16 [id number]  Weight: 77.8 kg (171 lb 8.3 oz) 77.111 kg (170 lb) 76.839 kg (169 lb 6.4 oz)    Examination:  General exam: Elderly gentleman, no acute distress Respiratory system: Clear to auscultation. Respiratory effort normal. Cardiovascular system: S1 & S2 heard, RRR. No JVD, murmurs, rubs, gallops or clicks. No pedal edema. Gastrointestinal system: Abdomen is nondistended, soft and nontender. No organomegaly or masses felt. Normal bowel sounds heard. Central nervous system: Alert and oriented. No focal neurological deficits. Extremities: Symmetric 5 x 5 power. Skin: No rashes, lesions or ulcers Psychiatry: He may be mildly confused    Data Reviewed: I have personally reviewed following labs and imaging studies  CBC:  Recent Labs Lab 01/14/16 2247 01/15/16 0520 01/15/16 1038 01/16/16 0518 01/17/16 0407 01/18/16 0431  WBC 19.7* 13.0* 10.1 6.1 7.7 6.3  NEUTROABS 17.7*  --   --   --   --   --   HGB 9.6* 9.3* 8.9* 8.0* 9.5* 9.8*  HCT 29.7* 29.4* 27.8* 25.0* 29.1* 29.5*  MCV 89.5 91.6 91.7 92.3 90.4 89.9  PLT 240 207 178 149* 172 0000000   Basic Metabolic Panel:  Recent Labs Lab 01/14/16 1600 01/15/16 0520 01/16/16 0518 01/17/16 0407  NA 134* 138 138 138  K 4.5 4.3 4.1 4.0  CL 99* 105 110 107  CO2 27 25 23 25   GLUCOSE 127* 102* 93 94  BUN 41* 40* 22* 14    CREATININE 1.81* 1.64* 1.07 0.83  CALCIUM 8.8* 8.7* 8.2* 8.1*  MG  --  2.1  --   --   PHOS  --  4.0  --   --    GFR: Estimated Creatinine Clearance: 65.5 mL/min (by C-G formula based on Cr of 0.83). Liver Function Tests:  Recent Labs Lab 01/14/16 1600 01/15/16 0520  AST 33 28  ALT 37 30  ALKPHOS 141* 119  BILITOT 0.7 0.6  PROT 7.0 6.2*  ALBUMIN 3.1* 2.8*    Recent Labs Lab 01/14/16 1600  LIPASE 20   No results for input(s): AMMONIA in the last 168 hours. Coagulation Profile:  Recent Labs Lab 01/14/16 1600 01/15/16 0520 01/16/16 0518 01/17/16 0407 01/18/16 0431  INR 2.40* 2.76* 2.40* 2.07* 1.88*   Cardiac Enzymes: No results for input(s): CKTOTAL, CKMB, CKMBINDEX, TROPONINI in the last 168 hours. BNP (last 3 results) No results for input(s): PROBNP in the last 8760 hours. HbA1C: No results for input(s): HGBA1C in the last 72 hours. CBG: No results for input(s): GLUCAP in the last 168 hours. Lipid Profile: No results for input(s): CHOL, HDL, LDLCALC, TRIG, CHOLHDL, LDLDIRECT in the last 72 hours. Thyroid Function Tests: No results for input(s): TSH, T4TOTAL, FREET4, T3FREE, THYROIDAB in the last 72 hours. Anemia Panel: No results for input(s): VITAMINB12, FOLATE, FERRITIN, TIBC, IRON, RETICCTPCT in the last 72 hours. Sepsis Labs:  Recent Labs Lab 01/14/16 1737  LATICACIDVEN 2.24*    Recent Results (from the past 240 hour(s))  Urine culture     Status: Abnormal   Collection Time: 01/14/16  4:05 PM  Result Value Ref Range Status   Specimen Description URINE, CLEAN CATCH  Final   Special Requests NONE  Final   Culture (A)  Final    >=100,000 COLONIES/mL METHICILLIN RESISTANT STAPHYLOCOCCUS AUREUS   Report Status 01/18/2016 FINAL  Final   Organism ID, Bacteria METHICILLIN RESISTANT STAPHYLOCOCCUS AUREUS (A)  Final      Susceptibility   Methicillin resistant staphylococcus aureus - MIC*    CIPROFLOXACIN Value in next row Resistant      RESISTANT>=4     GENTAMICIN Value in next row Sensitive      SENSITIVE<=0.5    NITROFURANTOIN Value in next row Sensitive      SENSITIVE<=16    OXACILLIN Value in next row Resistant      RESISTANT>=4    TETRACYCLINE Value in next row Sensitive      SENSITIVE<=1    VANCOMYCIN Value in next row Sensitive      SENSITIVE<=0.5    TRIMETH/SULFA Value in next row Sensitive      SENSITIVE<=10    CLINDAMYCIN Value in next row Sensitive      SENSITIVE<=0.25    RIFAMPIN Value in next row Sensitive      SENSITIVE<=0.5    * >=100,000 COLONIES/mL METHICILLIN RESISTANT STAPHYLOCOCCUS AUREUS  Culture, blood (Routine X 2) w Reflex to ID Panel     Status: Abnormal   Collection Time: 01/14/16  5:25 PM  Result Value Ref Range  Status   Specimen Description BLOOD LEFT ANTECUBITAL  Final   Special Requests BOTTLES DRAWN AEROBIC AND ANAEROBIC 5CC  Final   Culture  Setup Time   Final    GRAM POSITIVE COCCI IN CLUSTERS IN BOTH AEROBIC AND ANAEROBIC BOTTLES CRITICAL RESULT CALLED TO, READ BACK BY AND VERIFIED WITH: RN J MCNULTY AT 2103 [id number] MARTINB    Culture (A)  Final    STAPHYLOCOCCUS AUREUS SUSCEPTIBILITIES PERFORMED ON PREVIOUS CULTURE WITHIN THE LAST 5 DAYS. Performed at [facility]    Report Status 01/18/2016 FINAL  Final  Culture, blood (Routine X 2) w Reflex to ID Panel     Status: Abnormal   Collection Time: 01/14/16  9:09 PM  Result Value Ref Range Status   Specimen Description BLOOD RIGHT ARM  Final   Special Requests BOTTLES DRAWN AEROBIC AND ANAEROBIC 5CC  Final   Culture  Setup Time   Final    GRAM POSITIVE COCCI IN CLUSTERS IN BOTH AEROBIC AND ANAEROBIC BOTTLES CRITICAL RESULT CALLED TO, READ BACK BY AND VERIFIED WITH: TERRI GREEN PHARMD 01/16/16 AT 1924 BY [place] Performed at [place] (A)  Final   Report Status 01/18/2016 FINAL  Final   Organism ID, Bacteria METHICILLIN RESISTANT STAPHYLOCOCCUS AUREUS  Final       Susceptibility   Methicillin resistant staphylococcus aureus - MIC*    CIPROFLOXACIN >=8 RESISTANT Resistant     ERYTHROMYCIN >=8 RESISTANT Resistant     GENTAMICIN <=0.5 SENSITIVE Sensitive     OXACILLIN >=4 RESISTANT Resistant     TETRACYCLINE <=1 SENSITIVE Sensitive     VANCOMYCIN <=0.5 SENSITIVE Sensitive     TRIMETH/SULFA <=10 SENSITIVE Sensitive     CLINDAMYCIN <=0.25 SENSITIVE Sensitive     RIFAMPIN <=0.5 SENSITIVE Sensitive     Inducible Clindamycin NEGATIVE Sensitive     * METHICILLIN RESISTANT STAPHYLOCOCCUS AUREUS  Blood Culture ID Panel (Reflexed)     Status: Abnormal   Collection Time: 01/14/16  9:09 PM  Result Value Ref Range Status   Enterococcus species NOT DETECTED NOT DETECTED Final   Vancomycin resistance NOT DETECTED NOT DETECTED Final   Listeria monocytogenes NOT DETECTED NOT DETECTED Final   Staphylococcus species NOT DETECTED NOT DETECTED Final   Staphylococcus aureus DETECTED (A) NOT DETECTED Final    Comment: CRITICAL RESULT CALLED TO, READ BACK BY AND VERIFIED WITH: TERRI GREEN PHARMD 01/16/16 AT 1924 BY [place]    Methicillin resistance DETECTED (A) NOT DETECTED Final    Comment: CRITICAL RESULT CALLED TO, READ BACK BY AND VERIFIED WITH: TERRI GREEN PHARMD 01/16/16 AT 1924 BY [place]    Streptococcus species NOT DETECTED NOT DETECTED Final   Streptococcus agalactiae NOT DETECTED NOT DETECTED Final   Streptococcus pneumoniae NOT DETECTED NOT DETECTED Final   Streptococcus pyogenes NOT DETECTED NOT DETECTED Final   Acinetobacter baumannii NOT DETECTED NOT DETECTED Final   Enterobacteriaceae species NOT DETECTED NOT DETECTED Final   Enterobacter cloacae complex NOT DETECTED NOT DETECTED Final   Escherichia coli NOT DETECTED NOT DETECTED Final   Klebsiella oxytoca NOT DETECTED NOT DETECTED Final   Klebsiella pneumoniae NOT DETECTED NOT DETECTED Final   Proteus species NOT DETECTED NOT DETECTED Final   Serratia marcescens NOT DETECTED NOT DETECTED Final     Carbapenem resistance NOT DETECTED NOT DETECTED Final   Haemophilus influenzae NOT DETECTED NOT DETECTED Final   Neisseria meningitidis NOT DETECTED NOT DETECTED Final   Pseudomonas aeruginosa NOT DETECTED NOT  DETECTED Final   Candida albicans NOT DETECTED NOT DETECTED Final   Candida glabrata NOT DETECTED NOT DETECTED Final   Candida krusei NOT DETECTED NOT DETECTED Final   Candida parapsilosis NOT DETECTED NOT DETECTED Final   Candida tropicalis NOT DETECTED NOT DETECTED Final    Comment: Performed at [facility]  MRSA PCR Screening     Status: Abnormal   Collection Time: 01/14/16 10:42 PM  Result Value Ref Range Status   MRSA by PCR POSITIVE (A) NEGATIVE Final    Comment:        The GeneXpert MRSA Assay (FDA approved for NASAL specimens only), is one component of a comprehensive MRSA colonization surveillance program. It is not intended to diagnose MRSA infection nor to guide or monitor treatment for MRSA infections. RESULT CALLED TO, READ BACK BY AND VERIFIED WITH: A LAMB RN 0138 01/15/16 A NAVARRO   Culture, blood (Routine X 2) w Reflex to ID Panel     Status: None (Preliminary result)   Collection Time: 01/16/16  9:50 PM  Result Value Ref Range Status   Specimen Description BLOOD RIGHT ANTECUBITAL  Final   Special Requests BOTTLES DRAWN AEROBIC AND ANAEROBIC 10CC  Final   Culture   Final    NO GROWTH 1 DAY Performed at [facility]    Report Status PENDING  Incomplete  Culture, blood (Routine X 2) w Reflex to ID Panel     Status: None (Preliminary result)   Collection Time: 01/16/16  9:51 PM  Result Value Ref Range Status   Specimen Description BLOOD RIGHT ARM  Final   Special Requests BOTTLES DRAWN AEROBIC ONLY 5CC  Final   Culture   Final    NO GROWTH 1 DAY Performed at [facility]    Report Status PENDING  Incomplete         Radiology Studies: No results found.      Scheduled Meds: . sodium chloride   Intravenous Once  .  amiodarone  100 mg Oral Daily  . buPROPion  75 mg Oral BID  . Chlorhexidine Gluconate Cloth  6 each Topical Q0600  . mirabegron ER  25 mg Oral Daily  . mupirocin ointment  1 application Nasal BID  . pantoprazole  40 mg Oral BID AC  . polyethylene glycol-electrolytes  4,000 mL Oral Once  . pravastatin  80 mg Oral Daily  . sodium chloride flush  3 mL Intravenous Q12H  . traZODone  50 mg Oral QHS  . vancomycin  750 mg Intravenous Q12H   Continuous Infusions:     LOS: 4 days    Time spent: 35 min    Kelvin Cellar, MD [facility] Pager [phone]  If 7PM-7AM, please contact night-coverage [email or web address] Password [email or web address] 01/18/2016, 1:41 PM

## 2016-01-18 NOTE — Progress Notes (Signed)
Note that pt's CEA is elevated at 81.  Cleotis Nipper, M.D. Pager [phone] If no answer or after 5 PM call [phone]

## 2016-01-18 NOTE — Progress Notes (Signed)
Palliative consult request received Chart reviewed Patient asleep this am, confused upon waking up, daughter not at bedside early this am.  Note plans for sigmoidoscopy, elevated CEA. Will contact daughter in am and introduce palliative care, offer to follow along for supportive care, monitor disease trajectory in this hospitalization.   Thank you for consult.  Full note, assessment and recommendations to follow.   Loistine Chance MD [phone] office [phone] pager

## 2016-01-18 NOTE — Progress Notes (Signed)
[facility] PROGRESS NOTE Subjective Pt tolerating nulytely some stool  Objective: Vital signs in last 24 hours: Temp:  [98.2 F (36.8 C)-98.6 F (37 C)] 98.6 F (37 C) (05/30 [id number]) Pulse Rate:  [59-64] 59 (05/30 0613) Resp:  [18-20] 20 (05/30 0613) BP: (138-148)/(67-91) 148/91 mmHg (05/30 0613) SpO2:  [96 %-98 %] 96 % (05/30 [id number]) Weight:  [76.839 kg (169 lb 6.4 oz)] 76.839 kg (169 lb 6.4 oz) (05/30 0613) Last BM Date: 01/17/16  Intake/Output from previous day: 05/29 0701 - 05/30 0700 In: 1400 [P.O.:1400] Out: 1325 [Urine:1325] Intake/Output this shift:    PE: General--NAD  Abdomen--nondistended nontender Lab Results:  Recent Labs  01/16/16 0518 01/17/16 0407 01/18/16 0431  WBC 6.1 7.7 6.3  HGB 8.0* 9.5* 9.8*  HCT 25.0* 29.1* 29.5*  PLT 149* 172 158   BMET  Recent Labs  01/16/16 0518 01/17/16 0407  NA 138 138  K 4.1 4.0  CL 110 107  CO2 23 25  CREATININE 1.07 0.83   LFT No results for input(s): PROT, AST, ALT, ALKPHOS, BILITOT, BILIDIR, IBILI in the last 72 hours. PT/INR  Recent Labs  01/16/16 0518 01/17/16 0407 01/18/16 0431  LABPROT 25.9* 23.2* 21.5*  INR 2.40* 2.07* 1.88*   PANCREAS No results for input(s): LIPASE in the last 72 hours.       Studies/Results: No results found.  Medications: I have reviewed the patient's current medications.  Assessment/Plan: 1. Sigmoid Mass with liver mets. PT better, will give AM enema and schedule for sigmoid with Dr Amedeo Plenty in the AM Discussed with pt and daugher.    Jakobe Blau JR,Velencia Lenart L 01/18/2016, 11:01 AM  This note was created using voice recognition software. Minor errors may Have occurred unintentionally.  Pager: [phone] If no answer or after hours call [phone]

## 2016-01-19 ENCOUNTER — Inpatient Hospital Stay (HOSPITAL_COMMUNITY): Payer: Medicare Other

## 2016-01-19 ENCOUNTER — Encounter (HOSPITAL_COMMUNITY): Payer: Self-pay | Admitting: *Deleted

## 2016-01-19 ENCOUNTER — Encounter (HOSPITAL_COMMUNITY)
Admission: EM | Disposition: A | Discharge status: Home Health | Payer: Self-pay | Source: Home / Self Care | Attending: Internal Medicine

## 2016-01-19 DIAGNOSIS — Z515 Encounter for palliative care: Secondary | ICD-10-CM | POA: Insufficient documentation

## 2016-01-19 DIAGNOSIS — Z7189 Other specified counseling: Secondary | ICD-10-CM

## 2016-01-19 DIAGNOSIS — R7881 Bacteremia: Secondary | ICD-10-CM | POA: Insufficient documentation

## 2016-01-19 HISTORY — PX: FLEXIBLE SIGMOIDOSCOPY: SHX5431

## 2016-01-19 LAB — BASIC METABOLIC PANEL
ANION GAP: 6 (ref 5–15)
BUN: 6 mg/dL (ref 6–20)
CALCIUM: 8.4 mg/dL — AB (ref 8.9–10.3)
CO2: 26 mmol/L (ref 22–32)
CREATININE: 0.82 mg/dL (ref 0.61–1.24)
Chloride: 109 mmol/L (ref 101–111)
GFR calc non Af Amer: >60 mL/min (ref >60)
GLUCOSE: 93 mg/dL (ref 65–99)
POTASSIUM: 3.8 mmol/L (ref 3.5–5.1)
SODIUM: 141 mmol/L (ref 135–145)

## 2016-01-19 LAB — CBC
HCT: 29.5 % — ABNORMAL LOW (ref 39.0–52.0)
HEMOGLOBIN: 9.7 g/dL — AB (ref 13.0–17.0)
MCH: 29.9 pg (ref 26.0–34.0)
MCHC: 32.9 g/dL (ref 30.0–36.0)
MCV: 91 fL (ref 78.0–100.0)
PLATELETS: 161 10*3/uL (ref 150–400)
RBC: 3.24 MIL/uL — ABNORMAL LOW (ref 4.22–5.81)
RDW: 16.4 % — ABNORMAL HIGH (ref 11.5–15.5)
WBC: 7.7 10*3/uL (ref 4.0–10.5)

## 2016-01-19 LAB — HEPATITIS C ANTIBODY (REFLEX)

## 2016-01-19 LAB — HCV COMMENT:

## 2016-01-19 LAB — VANCOMYCIN, TROUGH: VANCOMYCIN TR: 19 ug/mL (ref 10.0–20.0)

## 2016-01-19 LAB — RPR: RPR: NONREACTIVE

## 2016-01-19 SURGERY — SIGMOIDOSCOPY, FLEXIBLE
Anesthesia: Moderate Sedation

## 2016-01-19 SURGERY — SIGMOIDOSCOPY, FLEXIBLE
Anesthesia: Moderate Sedation | Laterality: Left

## 2016-01-19 MED ORDER — MIDAZOLAM HCL 10 MG/2ML IJ SOLN
INTRAMUSCULAR | Status: DC | PRN
  Administered 2016-01-19 (×3): 1 mg via INTRAVENOUS

## 2016-01-19 MED ORDER — FENTANYL CITRATE (PF) 100 MCG/2ML IJ SOLN
INTRAMUSCULAR | Status: AC
  Filled 2016-01-19: qty 2

## 2016-01-19 MED ORDER — SODIUM CHLORIDE 0.9 % IV SOLN
INTRAVENOUS | Status: DC
Start: 2016-01-19 — End: 2016-01-25

## 2016-01-19 MED ORDER — VANCOMYCIN HCL 500 MG IV SOLR
500.0000 mg | Freq: Two times a day (BID) | INTRAVENOUS | Status: DC
  Administered 2016-01-19 – 2016-01-21 (×5): 500 mg via INTRAVENOUS
  Filled 2016-01-19 (×7): qty 500

## 2016-01-19 MED ORDER — MIDAZOLAM HCL 5 MG/ML IJ SOLN
INTRAMUSCULAR | Status: AC
  Filled 2016-01-19: qty 2

## 2016-01-19 MED ORDER — FENTANYL CITRATE (PF) 100 MCG/2ML IJ SOLN
INTRAMUSCULAR | Status: DC | PRN
  Administered 2016-01-19: 12.5 ug via INTRAVENOUS

## 2016-01-19 MED ORDER — HEPARIN (PORCINE) IN NACL 100-0.45 UNIT/ML-% IJ SOLN
1150.0000 [IU]/h | INTRAMUSCULAR | Status: DC
  Administered 2016-01-19 – 2016-01-20 (×2): 1150 [IU]/h via INTRAVENOUS
  Filled 2016-01-19 (×2): qty 250

## 2016-01-19 NOTE — Op Note (Signed)
[facility] Patient Name: [name] Procedure Date: 01/19/2016 MRN: [id number] Attending MD: Missy Sabins , MD Date of Birth: 1925-01-19 CSN: [id number] Age: 82 Admit Type: Inpatient Procedure:                Flexible Sigmoidoscopy Indications:              Abnormal CT of the GI tract Providers:                Elyse Jarvis. Amedeo Plenty, MD, Laverta Baltimore, RN, Alfonso Patten, Technician Referring MD:              Medicines:                Midazolam 3 mg IV, Fentanyl 10 micrograms IV Complications:            No immediate complications. Estimated Blood Loss:     Estimated blood loss: none. Procedure:                Pre-Anesthesia Assessment:                           - Prior to the procedure, a History and Physical                            was performed, and patient medications and                            allergies were reviewed. The patient's tolerance of                            previous anesthesia was also reviewed. The risks                            and benefits of the procedure and the sedation                            options and risks were discussed with the patient.                            All questions were answered, and informed consent                            was obtained. Prior Anticoagulants: The patient has                            taken Coumadin (warfarin), last dose was 4 days                            prior to procedure. ASA Grade Assessment: III - A                            patient with severe systemic disease. After  reviewing the risks and benefits, the patient was                            deemed in satisfactory condition to undergo the                            procedure.                           After obtaining informed consent, the scope was                            passed under direct vision. The [id number]                            scope was introduced through the  anus and advanced                            to the the descending colon. The flexible                            sigmoidoscopy was somewhat difficult due to                            multiple diverticula in the colon and a partially                            obstructing mass. The quality of the bowel                            preparation was adequate to identify polyps. Scope In: Scope Out: Findings:      Multiple diverticula were found in the sigmoid colon and descending       colon.      An infiltrative partially obstructing large mass was found in the       proximal sigmoid colon. The mass was partially circumferential       (involving two-thirds of the lumen circumference). Biopsies were taken       with a cold forceps for histology.      The exam was otherwise without abnormality. Impression:               - Diverticulosis in the sigmoid colon and in the                            descending colon.                           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 Biopsied.                           - The examination was otherwise normal. Moderate Sedation:      Moderate (conscious) sedation was administered by the  endoscopy nurse       and supervised by the endoscopist. The following parameters were       monitored: oxygen saturation, heart rate, blood pressure, respiratory       rate, EKG, adequacy of pulmonary ventilation, and response to care.       [Procedure Duration Time]. Recommendation:           - Await pathology results. Procedure Code(s):        --- Professional ---                           [phone], Sigmoidoscopy, flexible; with biopsy, single                            or multiple Diagnosis Code(s):        --- Professional ---                           D49.0, Neoplasm of unspecified behavior of                            digestive system                           K57.30, Diverticulosis of large  intestine without                            perforation or abscess without bleeding                           R93.3, Abnormal findings on diagnostic imaging of                            other parts of digestive tract CPT copyright 2016 [employer]. All rights reserved. The codes documented in this report are preliminary and upon coder review may  be revised to meet current compliance requirements. Missy Sabins, MD 01/19/2016 1:20:03 PM This report has been signed electronically. Number of Addenda: 0

## 2016-01-19 NOTE — Consult Note (Signed)
Consultation Note Date: 01/19/2016   Patient Name: [name]  DOB: 11-03-1924  MRN: [id number]  Age / Sex: 82 y.o., male  PCP: No primary care provider on file. Referring Physician: Albertine Patricia, MD  Reason for Consultation: Establishing goals of care  HPI/Patient Profile: 82 y.o. male  with past medical history of  HTN, A fib, HLD,  admitted on 01/14/2016 with hematuria, acute renal failure, abdominal imaging showing sigmoid mass and liver mass.   Clinical Assessment and Goals of Care:  Mr. Duecker is an 82 year old gentleman currently resides in the community with family members, having history of dyslipidemia, hypertension, atrial fibrillation, coronary artery disease, admitted to the medicine service on 01/14/2016 with complaints of hematuria. He is currently anticoagulated with warfarin for history of atrial fibrillation. Workup in the emergency department included a CT scan of abdomen and pelvis without contrast that revealed 4.3 x 2.7 cm mass in the sigmoid colon concerning for malignancy. Radiology also pointed out hepatic lesions could be related to metastatic disease. [facility] consulted. Patient was seen and evaluated by Dr.Buccini and Dr Oletta Lamas, plan for sigmoidoscopy/colonoscopy on Wednesday, 01/19/2016. INR has been trending down. Other issues addressed during this hospitalization include methicillin-resistant Staphylococcus aureus bacteremia. Same organism grew in urine, ? may have been MRSA urinary tract infection. Dr. Lucianne Lei dam of infectious disease was consulted. He remains on IV vancomycin. Coming in with bilateral extremity weakness Dr. Lucianne Lei dam recommended imaging his back once he got through colon prep and colonoscopy, considering the possibility of metastatic disease.   Palliative Medicine was consulted to facilitate discussions regarding his medical goals of care.   Patient is an elderly  gentleman resting in bed, he is NPO in anticipation of his sigmoidoscopy later today. He is hard of hearing. He is in no distress. Awakens easily, is able to answer questions appropriately. Patient to receive an enema soon.   Call placed and discussed with daughter [name]. I introduced palliative care as follows:  Palliative medicine is specialized medical care for people living with serious illness. It focuses on providing relief from the symptoms and stress of a serious illness. The goal is to improve quality of life for both the patient and the family.  Discussed about the patient's multiple underlying co morbidities, now compounded by his possible acute illness of malignancy, bacteremia. Brief goals of care discussions undertaken. Daughter wishes to focus on how the patient will tolerate his endoscopy today and the results from this procedure. We will continue to follow along, manage symptoms and help guide appropriate decision making. Thank you for the consult.   HCPOA  Daughter Jahmani Shia at [phone]   SUMMARY OF RECOMMENDATIONS: FULL CODE Sigmoidoscopy today Palliative care will continue to monitor disease trajectory and follow along.  No acute unmanaged symptoms currently, continue to monitor.    Code Status/Advance Care Planning:  Full code    Symptom Management:    continue current treatment.   Palliative Prophylaxis:   Bowel Regimen To receive Enema today.  Additional  Recommendations (Limitations, Scope, Preferences):  Full Scope Treatment  Psycho-social/Spiritual:   Desire for further Chaplaincy support:no  Additional Recommendations: Caregiving  Support/Resources  Prognosis:   Unable to determine  Discharge Planning: To Be Determined      Primary Diagnoses: Present on Admission:  . Dehydration . CAD (coronary artery disease) . Chronic systolic CHF (congestive heart failure) ([place]) . UTI (lower urinary tract infection) . Acute renal failure (ARF)  ([place]) . Hematuria . Essential hypertension . Chronic atrial fibrillation ([place]) . Pressure ulcer . Metastasis to liver [facility])  I have reviewed the medical record, interviewed the patient and family, and examined the patient. The following aspects are pertinent.  Past Medical History  Diagnosis Date  . Anemia     nos  . History of colonic polyps   . Hyperlipidemia   . Hypertension   . Cancer ([place]) [id number]    hx of basal cell  . BPH (benign prostatic hyperplasia)   . Anxiety attack     panic   . Pre-syncope     echo 03/2010,with EF 60%,mild RV dilation,no significant abnormalities...event monitor for 3wks/episodes os sinus bradycardia with heart rate to low 40's occasionally at night(suspect while sleeping)  . Atrial fibrillation ([place])     a. Dx 11/2012 - TEE with possible LAA thrombus, started on amio/coumadin.  . Systolic CHF ([place])     a. EF 25-30% 11/2012 ?tachy-mediated. b. Not on ACEI at dc due to AKI, can consider as OP.  [name] CAD (coronary artery disease)     a. Mod CAD by cath 12/10/12 - 60% long proximal LAD, 60% prox PDA.  [name] AKI (acute kidney injury) ([place])     a. Peak Cr 4.17 11/2012 - felt to be post-obstructive.  . Urinary retention     a. Post obstructive, dc'd with foley 12/2012.  [name] Renal cyst     a. By renal US 11/2012, not candidate for further eval.  . Thrombus of left atrial appendage     a. Possible LAA thrombus by TEE 11/2012.   Social History   Social History  . Marital Status: Married    Spouse Name: N/A  . Number of Children: N/A  . Years of Education: N/A   Social History Main Topics  . Smoking status: Former Smoker    Quit date: 08/22/1983  . Smokeless tobacco: None  . Alcohol Use: No  . Drug Use: No  . Sexual Activity: Not Asked   Other Topics Concern  . None   Social History Narrative   MARRIED   REGULAR EXERCISE--YES WALKS 15 MINS 4-5X WK   Family History  Problem Relation Age of Onset  . Heart attack Father   . Heart failure Father 39     S/P Turp  . Heart disease Father 58    MI.Marland KitchenMarland KitchenCHF S/P TURP @ 32  . Stroke Brother 77  . Coronary artery disease Brother   . Lung cancer Brother    Scheduled Meds: . sodium chloride   Intravenous Once  . amiodarone  100 mg Oral Daily  . buPROPion  75 mg Oral BID  . Chlorhexidine Gluconate Cloth  6 each Topical Q0600  . mirabegron ER  25 mg Oral Daily  . mupirocin ointment  1 application Nasal BID  . pantoprazole  40 mg Oral BID AC  . polyethylene glycol-electrolytes  4,000 mL Oral Once  . pravastatin  80 mg Oral Daily  . sodium chloride flush  3 mL Intravenous Q12H  . traZODone  50  mg Oral QHS  . vancomycin  750 mg Intravenous Q12H   Continuous Infusions: . sodium chloride 20 mL/hr at 01/18/16 1858   PRN Meds:.acetaminophen **OR** acetaminophen, HYDROcodone-acetaminophen, ondansetron **OR** ondansetron (ZOFRAN) IV, zolpidem Medications Prior to Admission:  Prior to Admission medications   Medication Sig Start Date End Date Taking? Authorizing Provider  acetaminophen (TYLENOL) 325 MG tablet Take 650 mg by mouth every 6 (six) hours as needed for moderate pain (pain).    Yes Historical Provider, MD  amiodarone (PACERONE) 200 MG tablet Take 0.5 tablets (100 mg total) by mouth daily. 04/19/15  Yes Larey Dresser, MD  buPROPion (WELLBUTRIN) 75 MG tablet Take 1 tablet (75 mg total) by mouth 2 (two) times daily. 06/10/15  Yes Hendricks Limes, MD  ferrous sulfate 325 (65 FE) MG tablet Take 325 mg by mouth at bedtime.   Yes Historical Provider, MD  finasteride (PROSCAR) 5 MG tablet Take 1 tablet (5 mg total) by mouth daily. 12/20/12  Yes Dayna N Dunn, PA-C  furosemide (LASIX) 40 MG tablet TAKE 1 TABLET (40 MG TOTAL) BY MOUTH EVERY OTHER DAY. 09/25/14  Yes Larey Dresser, MD  l-methylfolate-B6-B12 (METANX) 3-35-2 MG TABS tablet TAKE 1 TABLET BY MOUTH 2 (TWO) TIMES DAILY. 10/20/15  Yes Tamala Fothergill Regal, DPM  lisinopril (PRINIVIL,ZESTRIL) 5 MG tablet TAKE ONE TABLET BY MOUTH DAILY 12/30/15  Yes Larey Dresser, MD  Meth-Hyo-M Bl-Na Phos-Ph Sal (URIBEL PO) Take 1 capsule by mouth daily as needed (bladder spasm).    Yes Historical Provider, MD  mirabegron ER (MYRBETRIQ) 25 MG TB24 tablet Take 25 mg by mouth daily.   Yes Historical Provider, MD  Multiple Vitamin (MULTIVITAMIN) tablet Take 1 tablet by mouth daily.   Yes Historical Provider, MD  pantoprazole (PROTONIX) 40 MG tablet Take 1 tablet (40 mg total) by mouth 2 (two) times daily before a meal. After one month take 1 tablet daily. Patient taking differently: Take 40 mg by mouth daily. After one month take 1 tablet daily. 10/28/15  Yes Costin Karlyne Greenspan, MD  pravastatin (PRAVACHOL) 80 MG tablet TAKE 1 TABLET (80 MG TOTAL) BY MOUTH DAILY. 07/13/15  Yes Larey Dresser, MD  Probiotic Product (ALIGN) 4 MG CAPS Take 1 capsule by mouth daily.    Yes Historical Provider, MD  traZODone (DESYREL) 50 MG tablet Take 50 mg by mouth at bedtime. 01/11/16  Yes Historical Provider, MD  warfarin (COUMADIN) 2.5 MG tablet TAKE AS DIRECTED BY ANTICOAGULATION CLINIC 02/17/15  Yes Larey Dresser, MD  zolpidem (AMBIEN) 5 MG tablet Take 2.5 mg by mouth at bedtime as needed for sleep.  12/14/15  Yes Historical Provider, MD  levofloxacin (LEVAQUIN) 500 MG tablet Take 1 tablet (500 mg total) by mouth daily. Patient not taking: Reported on 01/14/2016 10/28/15   Caren Griffins, MD   Allergies  Allergen Reactions  . Darifenacin Hydrobromide     REACTION: constipation  . Fluoxetine Hcl     REACTION: Very Depressed while taking   Review of Systems Hard of hearing.   Physical Exam eldelrly gentleman, hard of hearing S1 S2 Clear anteriorly Complains of pain in his toes No edema Non focal Abdomen soft non tender  Vital Signs: BP 139/74 mmHg  Pulse 64  Temp(Src) 98.7 F (37.1 C) (Oral)  Resp 20  Ht 6' (1.829 m)  Wt 82.9 kg (182 lb 12.2 oz)  BMI 24.78 kg/m2  SpO2 96% Pain Assessment: No/denies pain POSS *See Group Information*: S-Acceptable,Sleep, easy to  arouse Pain Score: 0-No pain   SpO2: SpO2: 96 % O2 Device:SpO2: 96 % O2 Flow Rate: .   IO: Intake/output summary:  Intake/Output Summary (Last 24 hours) at 01/19/16 [id number] Last data filed at 01/19/16 [id number]  Gross per 24 hour  Intake   1328 ml  Output   1100 ml  Net    228 ml    LBM: Last BM Date: 01/18/16 Baseline Weight: Weight: 79.379 kg (175 lb) Most recent weight: Weight: 82.9 kg (182 lb 12.2 oz)     Palliative Assessment/Data:   Flowsheet Rows        Most Recent Value   Intake Tab    Referral Department  Hospitalist   Unit at Time of Referral  Oncology Unit   Palliative Care Primary Diagnosis  Cancer   Palliative Care Type  New Palliative care   Reason for referral  Clarify Goals of Care   Date first seen by Palliative Care  01/18/16   Clinical Assessment    Palliative Performance Scale Score  40%   Pain Max last 24 hours  5   Pain Min Last 24 hours  4   Dyspnea Max Last 24 Hours  4   Dyspnea Min Last 24 hours  3   Psychosocial & Spiritual Assessment    Palliative Care Outcomes    Patient/Family meeting held?  Yes   Who was at the meeting?  daughter [name] over the phone.       Time In: 8 Time Out: 9 Time Total: 60 min  Greater than 50%  of this time was spent counseling and coordinating care related to the above assessment and plan.  Signed by: Loistine Chance, MD  [phone]  Please contact Palliative Medicine Team phone at [phone] for questions and concerns.  For individual provider: See Shea Evans

## 2016-01-19 NOTE — Progress Notes (Signed)
Patient refused to have Lumbar Spine MRI completed. Stated, "I don't see any point of having a test done on my back when there's nothing wrong with it." Pt reminded that ID recommended MRI- continued to refuse. MD paged to be made aware.

## 2016-01-19 NOTE — Progress Notes (Signed)
PROGRESS NOTE    [name]  [id number] DOB: March 28, 1925 DOA: 01/14/2016 PCP: No primary care provider on file.  Brief Narrative: [name] is an 82 year old gentleman currently resides in the community with family members, having history of dyslipidemia, hypertension, atrial fibrillation, coronary artery disease, admitted to the medicine service on 01/14/2016 with complaints of hematuria. He is currently anticoagulated with warfarin for history of atrial fibrillation. Workup in the emergency department included a CT scan of abdomen and pelvis without contrast that revealed 4.3 x 2.7 cm mass in the sigmoid colon concerning for malignancy. Radiology also pointed out hepatic lesions could be related to metastatic disease. [facility] consulted. Patient was seen and evaluated by Dr.Buccini and Dr Oletta Lamas, plan for sigmoidoscopy/colonoscopy on Wednesday, 01/19/2016. INR has trending down. Other issues addressed during this hospitalization include methicillin-resistant Staphylococcus aureus bacteremia. Same organism grew in urine making I think that source of infection may have been MRSA urinary tract infection. Dr. Lucianne Lei dam of infectious disease was consulted. He remains on IV vancomycin. Coming in with bilateral extremity weakness Dr. Lucianne Lei dam recommended imaging is back once he got through colon prep and colonoscopy, considering the possibility of metastatic disease.  Palliative Medicine was consulted to facilitate discussions regarding his medical goals of care.    Assessment & Plan:   Active Problems:   UTI (lower urinary tract infection)   Essential hypertension   CAD (coronary artery disease)   Chronic systolic CHF (congestive heart failure) (HCC)   Long term (current) use of anticoagulants   Pressure ulcer   Dehydration   Acute renal failure (ARF) (HCC)   Hematuria   Chronic atrial fibrillation (HCC)   Metastasis to liver (HCC)   Sepsis ([place])   Acute kidney injury ([place])   Colonic mass   MRSA bacteremia   Staphylococcus aureus bacteremia with sepsis (HCC)   Weakness of both lower extremities   Bilateral foot-drop   Lewy body dementia without behavioral disturbance   Goals of care, counseling/discussion   Encounter for palliative care  Suspected metastatic colon cancer. - last colonoscopy in 2007 at which time had removal of malignant polyp. Surveillance exam later that year unremarkable. -CT scan of abdomen and pelvis performed in the emergency room showed a 4.3 x 2.7 cm mass involving sigmoid colon along with liver lesions that could represent metastatic disease. -Plan for sigmoidoscopy/colonoscopy today.  MRSA bacteremia - Cultures on 5/26 is growing MRSA - urine is growing Staphylococcus aureus and I am suspecting this to be his source of infection. -Transthoracic echocardiogram performed on 01/17/2016 showing ejection fraction of [id number], grade 2 diastolic dysfunction, without evidence of vegetations. -Continue IV vancomycin. Had been on IV ceftriaxone which was discontinued. -Repeat blood cultures on 01/16/2016 : no growth 2 sets. -Dr. Lucianne Lei dam of infectious disease consulted.  - will check MRI lumbar spine per ID recommendation .  Hematuria. -Initially presented with complaints of hematuria. He is anticoagulated with warfarin presented with INR of 2.76 and PTT of 28.8. -CT showed left renal pelvic stone without obstructive changes. -Urine analysis consistent with urinary tract infection. -Initially he was treated with ceftriaxone however urine cultures growing methicillin-resistant Staphylococcus aureus, antibiotic regimen changed to IV vancomycin. Urine clearing up.  History of atrial fibrillation -CHADSVASC score 5 -Has been anticoagulated with warfarin. Warfarin held since CT scan revealed sigmoid mass and will require sigmoidoscopy with biopsy -Continue amiodarone 100 mg by mouth daily   Dyslipidemia. -Continue pravastatin 80 mg by mouth daily  Acute  kidney injury. -Resolved  with IV fluid resuscitation  Acute blood loss anemia. -Likely secondary to hematuria and possibly colon cancer. Lab work as revealed a downward trend in his hemoglobin to 8.0 from 9.62 days ago. -He was transfuse 1 unit of packed red blood cells on 01/16/2016  Urinary tract infection. -Urinalysis revealed the presence of many bacteria with leukocytes. -Urine cultures pending -Urine cultures now growing Staphylococcus aureus as blood cultures reported by microbiology to be growing methicillin-resistant Staphylococcus aureus -Antibiotic coverage changed to vancomycin  Sepsis -Present on admission, evidenced by a white count of 29,800, blood cultures now returning positive for methicillin-resistant Staphylococcus aureus, acute kidney injury. -The source of infection likely urinary tract given urine cultures growing Staphylococcus aureus as well. -White count now trending down, he was started on IV vancomycin. Repeat blood cultures negative.   DVT prophylaxis: He is fully anticoagulated also has SCDs Code Status: Full code Family Communication: None at bedside Disposition Plan: Will treat urinary tract infection, provide IV fluids, GI consulted for sigmoidoscopy on 01/19/2016   Consultants:   GI  ID  Palliative medicine   Procedures:   Antimicrobials:   Ceftriaxone 1 g IV every 24 hours discontinued on 01/17/2016  Vancomycin started on 01/17/2016  Subjective: [name] is awake and alert, no acute distress, has no complaints for me  Objective: Filed Vitals:   01/18/16 1323 01/18/16 2136 01/19/16 0646 01/19/16 0648  BP: 138/64 142/66 139/74   Pulse: 54 57 64   Temp: 98.1 F (36.7 C) 98.1 F (36.7 C) 98.7 F (37.1 C)   TempSrc: Oral Oral Oral   Resp: 24 20 20    Height:      Weight:    82.9 kg (182 lb 12.2 oz)  SpO2: 98% 97% 96%     Intake/Output Summary (Last 24 hours) at 01/19/16 1111 Last data filed at 01/19/16 [id number]  Gross per 24 hour   Intake    728 ml  Output   1100 ml  Net   -372 ml   Filed Weights   01/17/16 0559 01/18/16 0613 01/19/16 0648  Weight: 77.111 kg (170 lb) 76.839 kg (169 lb 6.4 oz) 82.9 kg (182 lb 12.2 oz)    Examination:  General exam: Elderly gentleman, no acute distress Respiratory system: Clear to auscultation. Respiratory effort normal. Cardiovascular system: S1 & S2 heard, RRR. No JVD, murmurs, rubs, gallops or clicks. No pedal edema. Gastrointestinal system: Abdomen is nondistended, soft and nontender. No organomegaly or masses felt. Normal bowel sounds heard. Central nervous system: Alert and oriented. No focal neurological deficits. Extremities: Symmetric 5 x 5 power. Skin: No rashes, lesions or ulcers Psychiatry: He may be mildly confused    Data Reviewed: I have personally reviewed following labs and imaging studies  CBC:  Recent Labs Lab 01/14/16 2247  01/15/16 1038 01/16/16 0518 01/17/16 0407 01/18/16 0431 01/19/16 0452  WBC 19.7*  < > 10.1 6.1 7.7 6.3 7.7  NEUTROABS 17.7*  --   --   --   --   --   --   HGB 9.6*  < > 8.9* 8.0* 9.5* 9.8* 9.7*  HCT 29.7*  < > 27.8* 25.0* 29.1* 29.5* 29.5*  MCV 89.5  < > 91.7 92.3 90.4 89.9 91.0  PLT 240  < > 178 149* 172 158 161  < > = values in this interval not displayed. Basic Metabolic Panel:  Recent Labs Lab 01/14/16 1600 01/15/16 0520 01/16/16 0518 01/17/16 0407 01/19/16 0452  NA 134* 138 138 138 141  K 4.5  4.3 4.1 4.0 3.8  CL 99* 105 110 107 109  CO2 27 25 23 25 26   GLUCOSE 127* 102* 93 94 93  BUN 41* 40* 22* 14 6  CREATININE 1.81* 1.64* 1.07 0.83 0.82  CALCIUM 8.8* 8.7* 8.2* 8.1* 8.4*  MG  --  2.1  --   --   --   PHOS  --  4.0  --   --   --    GFR: Estimated Creatinine Clearance: 67 mL/min (by C-G formula based on Cr of 0.82). Liver Function Tests:  Recent Labs Lab 01/14/16 1600 01/15/16 0520  AST 33 28  ALT 37 30  ALKPHOS 141* 119  BILITOT 0.7 0.6  PROT 7.0 6.2*  ALBUMIN 3.1* 2.8*    Recent Labs Lab  01/14/16 1600  LIPASE 20   No results for input(s): AMMONIA in the last 168 hours. Coagulation Profile:  Recent Labs Lab 01/14/16 1600 01/15/16 0520 01/16/16 0518 01/17/16 0407 01/18/16 0431  INR 2.40* 2.76* 2.40* 2.07* 1.88*   Cardiac Enzymes: No results for input(s): CKTOTAL, CKMB, CKMBINDEX, TROPONINI in the last 168 hours. BNP (last 3 results) No results for input(s): PROBNP in the last 8760 hours. HbA1C: No results for input(s): HGBA1C in the last 72 hours. CBG: No results for input(s): GLUCAP in the last 168 hours. Lipid Profile: No results for input(s): CHOL, HDL, LDLCALC, TRIG, CHOLHDL, LDLDIRECT in the last 72 hours. Thyroid Function Tests: No results for input(s): TSH, T4TOTAL, FREET4, T3FREE, THYROIDAB in the last 72 hours. Anemia Panel: No results for input(s): VITAMINB12, FOLATE, FERRITIN, TIBC, IRON, RETICCTPCT in the last 72 hours. Sepsis Labs:  Recent Labs Lab 01/14/16 1737  LATICACIDVEN 2.24*    Recent Results (from the past 240 hour(s))  Urine culture     Status: Abnormal   Collection Time: 01/14/16  4:05 PM  Result Value Ref Range Status   Specimen Description URINE, CLEAN CATCH  Final   Special Requests NONE  Final   Culture (A)  Final    >=100,000 COLONIES/mL METHICILLIN RESISTANT STAPHYLOCOCCUS AUREUS   Report Status 01/18/2016 FINAL  Final   Organism ID, Bacteria METHICILLIN RESISTANT STAPHYLOCOCCUS AUREUS (A)  Final      Susceptibility   Methicillin resistant staphylococcus aureus - MIC*    CIPROFLOXACIN Value in next row Resistant      RESISTANT>=4    GENTAMICIN Value in next row Sensitive      SENSITIVE<=0.5    NITROFURANTOIN Value in next row Sensitive      SENSITIVE<=16    OXACILLIN Value in next row Resistant      RESISTANT>=4    TETRACYCLINE Value in next row Sensitive      SENSITIVE<=1    VANCOMYCIN Value in next row Sensitive      SENSITIVE<=0.5    TRIMETH/SULFA Value in next row Sensitive      SENSITIVE<=10     CLINDAMYCIN Value in next row Sensitive      SENSITIVE<=0.25    RIFAMPIN Value in next row Sensitive      SENSITIVE<=0.5    * >=100,000 COLONIES/mL METHICILLIN RESISTANT STAPHYLOCOCCUS AUREUS  Culture, blood (Routine X 2) w Reflex to ID Panel     Status: Abnormal   Collection Time: 01/14/16  5:25 PM  Result Value Ref Range Status   Specimen Description BLOOD LEFT ANTECUBITAL  Final   Special Requests BOTTLES DRAWN AEROBIC AND ANAEROBIC 5CC  Final   Culture  Setup Time   Final    GRAM POSITIVE COCCI  IN CLUSTERS IN BOTH AEROBIC AND ANAEROBIC BOTTLES CRITICAL RESULT CALLED TO, READ BACK BY AND VERIFIED WITH: RN J MCNULTY AT 2103 [id number] MARTINB    Culture (A)  Final    STAPHYLOCOCCUS AUREUS SUSCEPTIBILITIES PERFORMED ON PREVIOUS CULTURE WITHIN THE LAST 5 DAYS. Performed at [facility]    Report Status 01/18/2016 FINAL  Final  Culture, blood (Routine X 2) w Reflex to ID Panel     Status: Abnormal   Collection Time: 01/14/16  9:09 PM  Result Value Ref Range Status   Specimen Description BLOOD RIGHT ARM  Final   Special Requests BOTTLES DRAWN AEROBIC AND ANAEROBIC 5CC  Final   Culture  Setup Time   Final    GRAM POSITIVE COCCI IN CLUSTERS IN BOTH AEROBIC AND ANAEROBIC BOTTLES CRITICAL RESULT CALLED TO, READ BACK BY AND VERIFIED WITH: TERRI GREEN PHARMD 01/16/16 AT 1924 BY [place] Performed at [place] (A)  Final   Report Status 01/18/2016 FINAL  Final   Organism ID, Bacteria METHICILLIN RESISTANT STAPHYLOCOCCUS AUREUS  Final      Susceptibility   Methicillin resistant staphylococcus aureus - MIC*    CIPROFLOXACIN >=8 RESISTANT Resistant     ERYTHROMYCIN >=8 RESISTANT Resistant     GENTAMICIN <=0.5 SENSITIVE Sensitive     OXACILLIN >=4 RESISTANT Resistant     TETRACYCLINE <=1 SENSITIVE Sensitive     VANCOMYCIN <=0.5 SENSITIVE Sensitive     TRIMETH/SULFA <=10 SENSITIVE Sensitive     CLINDAMYCIN <=0.25  SENSITIVE Sensitive     RIFAMPIN <=0.5 SENSITIVE Sensitive     Inducible Clindamycin NEGATIVE Sensitive     * METHICILLIN RESISTANT STAPHYLOCOCCUS AUREUS  Blood Culture ID Panel (Reflexed)     Status: Abnormal   Collection Time: 01/14/16  9:09 PM  Result Value Ref Range Status   Enterococcus species NOT DETECTED NOT DETECTED Final   Vancomycin resistance NOT DETECTED NOT DETECTED Final   Listeria monocytogenes NOT DETECTED NOT DETECTED Final   Staphylococcus species NOT DETECTED NOT DETECTED Final   Staphylococcus aureus DETECTED (A) NOT DETECTED Final    Comment: CRITICAL RESULT CALLED TO, READ BACK BY AND VERIFIED WITH: TERRI GREEN PHARMD 01/16/16 AT 1924 BY [place]    Methicillin resistance DETECTED (A) NOT DETECTED Final    Comment: CRITICAL RESULT CALLED TO, READ BACK BY AND VERIFIED WITH: TERRI GREEN PHARMD 01/16/16 AT 1924 BY Fernando Salinas    Streptococcus species NOT DETECTED NOT DETECTED Final   Streptococcus agalactiae NOT DETECTED NOT DETECTED Final   Streptococcus pneumoniae NOT DETECTED NOT DETECTED Final   Streptococcus pyogenes NOT DETECTED NOT DETECTED Final   Acinetobacter baumannii NOT DETECTED NOT DETECTED Final   Enterobacteriaceae species NOT DETECTED NOT DETECTED Final   Enterobacter cloacae complex NOT DETECTED NOT DETECTED Final   Escherichia coli NOT DETECTED NOT DETECTED Final   Klebsiella oxytoca NOT DETECTED NOT DETECTED Final   Klebsiella pneumoniae NOT DETECTED NOT DETECTED Final   Proteus species NOT DETECTED NOT DETECTED Final   Serratia marcescens NOT DETECTED NOT DETECTED Final   Carbapenem resistance NOT DETECTED NOT DETECTED Final   Haemophilus influenzae NOT DETECTED NOT DETECTED Final   Neisseria meningitidis NOT DETECTED NOT DETECTED Final   Pseudomonas aeruginosa NOT DETECTED NOT DETECTED Final   Candida albicans NOT DETECTED NOT DETECTED Final   Candida glabrata NOT DETECTED NOT DETECTED Final   Candida krusei NOT DETECTED NOT DETECTED Final     Candida parapsilosis NOT DETECTED NOT DETECTED  Final   Candida tropicalis NOT DETECTED NOT DETECTED Final    Comment: Performed at [facility]  MRSA PCR Screening     Status: Abnormal   Collection Time: 01/14/16 10:42 PM  Result Value Ref Range Status   MRSA by PCR POSITIVE (A) NEGATIVE Final    Comment:        The GeneXpert MRSA Assay (FDA approved for NASAL specimens only), is one component of a comprehensive MRSA colonization surveillance program. It is not intended to diagnose MRSA infection nor to guide or monitor treatment for MRSA infections. RESULT CALLED TO, READ BACK BY AND VERIFIED WITH: A LAMB RN 0138 01/15/16 A NAVARRO   Culture, blood (Routine X 2) w Reflex to ID Panel     Status: None (Preliminary result)   Collection Time: 01/16/16  9:50 PM  Result Value Ref Range Status   Specimen Description BLOOD RIGHT ANTECUBITAL  Final   Special Requests BOTTLES DRAWN AEROBIC AND ANAEROBIC 10CC  Final   Culture   Final    NO GROWTH 1 DAY Performed at [facility]    Report Status PENDING  Incomplete  Culture, blood (Routine X 2) w Reflex to ID Panel     Status: None (Preliminary result)   Collection Time: 01/16/16  9:51 PM  Result Value Ref Range Status   Specimen Description BLOOD RIGHT ARM  Final   Special Requests BOTTLES DRAWN AEROBIC ONLY 5CC  Final   Culture   Final    NO GROWTH 1 DAY Performed at [facility]    Report Status PENDING  Incomplete         Radiology Studies: No results found.      Scheduled Meds: . sodium chloride   Intravenous Once  . amiodarone  100 mg Oral Daily  . buPROPion  75 mg Oral BID  . Chlorhexidine Gluconate Cloth  6 each Topical Q0600  . mirabegron ER  25 mg Oral Daily  . pantoprazole  40 mg Oral BID AC  . polyethylene glycol-electrolytes  4,000 mL Oral Once  . pravastatin  80 mg Oral Daily  . sodium chloride flush  3 mL Intravenous Q12H  . traZODone  50 mg Oral QHS  . vancomycin  500 mg  Intravenous Q12H   Continuous Infusions: . sodium chloride 20 mL/hr at 01/18/16 1858     LOS: 5 days    Time spent: 25 min    Agastya Meister, MD [facility] Pager [phone]  If 7PM-7AM, please contact night-coverage [email or web address] Password [email or web address] 01/19/2016, 11:11 AM

## 2016-01-19 NOTE — Progress Notes (Signed)
ANTICOAGULATION CONSULT NOTE - Initial Consult  Pharmacy Consult for Heparin Indication: atrial fibrillation  Allergies  Allergen Reactions  . Darifenacin Hydrobromide     REACTION: constipation  . Fluoxetine Hcl     REACTION: Very Depressed while taking    Patient Measurements: Height: 6' (182.9 cm) Weight: 182 lb 12.2 oz (82.9 kg) IBW/kg (Calculated) : 77.6 Heparin Dosing Weight: 82.9 kg  Vital Signs: Temp: 98.1 F (36.7 C) (05/31 1407) Temp Source: Oral (05/31 1407) BP: 138/61 mmHg (05/31 1407) Pulse Rate: 54 (05/31 1407)  Labs:  Recent Labs  01/17/16 0407 01/18/16 0431 01/19/16 0452  HGB 9.5* 9.8* 9.7*  HCT 29.1* 29.5* 29.5*  PLT 172 158 161  LABPROT 23.2* 21.5*  --   INR 2.07* 1.88*  --   CREATININE 0.83  --  0.82    Estimated Creatinine Clearance: 67 mL/min (by C-G formula based on Cr of 0.82).   Medical History: Past Medical History  Diagnosis Date  . Anemia     nos  . History of colonic polyps   . Hyperlipidemia   . Hypertension   . Cancer ([place]) [id number]    hx of basal cell  . BPH (benign prostatic hyperplasia)   . Anxiety attack     panic   . Pre-syncope     echo 03/2010,with EF 60%,mild RV dilation,no significant abnormalities...event monitor for 3wks/episodes os sinus bradycardia with heart rate to low 40's occasionally at night(suspect while sleeping)  . Atrial fibrillation ([place])     a. Dx 11/2012 - TEE with possible LAA thrombus, started on amio/coumadin.  . Systolic CHF ([place])     a. EF 25-30% 11/2012 ?tachy-mediated. b. Not on ACEI at dc due to AKI, can consider as OP.  [name] CAD (coronary artery disease)     a. Mod CAD by cath 12/10/12 - 60% long proximal LAD, 60% prox PDA.  [name] AKI (acute kidney injury) ([place])     a. Peak Cr 4.17 11/2012 - felt to be post-obstructive.  . Urinary retention     a. Post obstructive, dc'd with foley 12/2012.  [name] Renal cyst     a. By renal US 11/2012, not candidate for further eval.  . Thrombus of left atrial  appendage     a. Possible LAA thrombus by TEE 11/2012.    Medications:  Prescriptions prior to admission  Medication Sig Dispense Refill Last Dose  . acetaminophen (TYLENOL) 325 MG tablet Take 650 mg by mouth every 6 (six) hours as needed for moderate pain (pain).    Past Week at Unknown time  . amiodarone (PACERONE) 200 MG tablet Take 0.5 tablets (100 mg total) by mouth daily. 30 tablet 2 01/13/2016 at Unknown time  . buPROPion (WELLBUTRIN) 75 MG tablet Take 1 tablet (75 mg total) by mouth 2 (two) times daily. 60 tablet 0 01/13/2016 at Unknown time  . ferrous sulfate 325 (65 FE) MG tablet Take 325 mg by mouth at bedtime.   01/13/2016 at Unknown time  . finasteride (PROSCAR) 5 MG tablet Take 1 tablet (5 mg total) by mouth daily. 30 tablet 0 01/13/2016 at Unknown time  . furosemide (LASIX) 40 MG tablet TAKE 1 TABLET (40 MG TOTAL) BY MOUTH EVERY OTHER DAY. 15 tablet 3 01/13/2016 at Unknown time  . l-methylfolate-B6-B12 (METANX) 3-35-2 MG TABS tablet TAKE 1 TABLET BY MOUTH 2 (TWO) TIMES DAILY. 60 tablet 6 01/13/2016 at Unknown time  . lisinopril (PRINIVIL,ZESTRIL) 5 MG tablet TAKE ONE TABLET BY MOUTH DAILY 30 tablet 2  01/13/2016 at Unknown time  . Meth-Hyo-M Bl-Na Phos-Ph Sal (URIBEL PO) Take 1 capsule by mouth daily as needed (bladder spasm).    01/13/2016 at Unknown time  . mirabegron ER (MYRBETRIQ) 25 MG TB24 tablet Take 25 mg by mouth daily.   01/13/2016 at Unknown time  . Multiple Vitamin (MULTIVITAMIN) tablet Take 1 tablet by mouth daily.   01/13/2016 at Unknown time  . pantoprazole (PROTONIX) 40 MG tablet Take 1 tablet (40 mg total) by mouth 2 (two) times daily before a meal. After one month take 1 tablet daily. (Patient taking differently: Take 40 mg by mouth daily. After one month take 1 tablet daily.) 60 tablet 1 01/13/2016 at Unknown time  . pravastatin (PRAVACHOL) 80 MG tablet TAKE 1 TABLET (80 MG TOTAL) BY MOUTH DAILY. 30 tablet 3 01/13/2016 at Unknown time  . Probiotic Product (ALIGN) 4 MG CAPS  Take 1 capsule by mouth daily.    01/13/2016 at Unknown time  . traZODone (DESYREL) 50 MG tablet Take 50 mg by mouth at bedtime.   01/13/2016 at Unknown time  . warfarin (COUMADIN) 2.5 MG tablet TAKE AS DIRECTED BY ANTICOAGULATION CLINIC 120 tablet 1 01/13/2016 at 2130  . zolpidem (AMBIEN) 5 MG tablet Take 2.5 mg by mouth at bedtime as needed for sleep.   5 01/13/2016 at Unknown time  . levofloxacin (LEVAQUIN) 500 MG tablet Take 1 tablet (500 mg total) by mouth daily. (Patient not taking: Reported on 01/14/2016) 2 tablet 0 Completed Course at Unknown time   Scheduled:  . sodium chloride   Intravenous Once  . amiodarone  100 mg Oral Daily  . buPROPion  75 mg Oral BID  . Chlorhexidine Gluconate Cloth  6 each Topical Q0600  . mirabegron ER  25 mg Oral Daily  . pantoprazole  40 mg Oral BID AC  . polyethylene glycol-electrolytes  4,000 mL Oral Once  . pravastatin  80 mg Oral Daily  . sodium chloride flush  3 mL Intravenous Q12H  . traZODone  50 mg Oral QHS  . vancomycin  500 mg Intravenous Q12H   Infusions:  . sodium chloride 500 mL (01/19/16 1237)  . sodium chloride      Assessment: 76 yoM admitted on 5/26 with hematuria.  PMH includes chair/bed bound due to lower extremity weakness with foot drop, BPH and obstructive uropathy with chronic foley, on chronic warfarin for hx Afib, and FTT. He was found to have large colon mass and liver lesions, and MRSA bacteremia and UTI.  Warfarin was held on admission for hematuria and planned biopsy completed on 5/31.  Post biopsy, MD requests pharmacy dose Heparin drip while warfarin remains on hold for further possible procedures.  Significant Events: 5/25 PTA Warfarin 2.5 mg daily.  Last dose on 5/25 5/29 Transfused PRBC  Today, 01/19/2016: INR 1.88 (last on 5/30) CBC:  Hgb 9.7, Plt 161  Goal of Therapy:  Heparin level 0.3-0.7 units/ml Monitor platelets by anticoagulation protocol: Yes   Plan:   Per MD, start heparin drip 8 hours after 2pm  biopsy.  No heparin bolus  Start heparin IV infusion at 1150 units/hr at 2200  Heparin level 8 hours after starting  Daily heparin level, INR, and CBC.  Follow up plan for resuming oral anticoagulation.  Gretta Arab PharmD, BCPS Pager [phone] 01/19/2016 4:17 PM

## 2016-01-19 NOTE — Progress Notes (Addendum)
Pharmacy Antibiotic Follow-up Note  [name] is a 82 y.o. year-old male admitted on 01/14/2016 with MRSA UTI and bacteremia.  Pharmacy dosing Vancomycin.   5/31: Repeat blood cultures NGTD.  Renal function improved since admission. Vancomycin trough checked today = 19, therapeutic (goal 15-20).  Noted plans for at least 4 weeks of therapy. With advanced age, concern for accumulation therefore will empirically reduce Vancomycin dose.   Plan: Reduce to Vancomycin 500mg  IV q12h.  Follow renal function and cultures.  Recheck VT at new Css or as warranted with change in renal function.     Temp (24hrs), Avg:98.3 F (36.8 C), Min:98.1 F (36.7 C), Max:98.7 F (37.1 C)   Recent Labs Lab 01/15/16 1038 01/16/16 0518 01/17/16 0407 01/18/16 0431 01/19/16 0452  WBC 10.1 6.1 7.7 6.3 7.7     Recent Labs Lab 01/14/16 1600 01/15/16 0520 01/16/16 0518 01/17/16 0407 01/19/16 0452  CREATININE 1.81* 1.64* 1.07 0.83 0.82   Estimated Creatinine Clearance: 67 mL/min (by C-G formula based on Cr of 0.82).    Allergies  Allergen Reactions  . Darifenacin Hydrobromide     REACTION: constipation  . Fluoxetine Hcl     REACTION: Very Depressed while taking   Antimicrobials this admission: 5/27 CTX>> 5/28 5/28 Vancomycin >>  Levels/dose changes this admission:  Microbiology results: 5/26 bcx x2:  BCID MRSA 5/26 ucx: >100K MRSA 5/26 MRSA PCR (+)--> chlx, bactroban 5/28 BCx2: NGTD  Thank you for allowing pharmacy to be a part of this patient's care.  Ralene Bathe, PharmD, BCPS 01/19/2016, 10:10 AM  Pager: [phone]

## 2016-01-20 DIAGNOSIS — N390 Urinary tract infection, site not specified: Secondary | ICD-10-CM

## 2016-01-20 DIAGNOSIS — M25562 Pain in left knee: Secondary | ICD-10-CM

## 2016-01-20 DIAGNOSIS — B9562 Methicillin resistant Staphylococcus aureus infection as the cause of diseases classified elsewhere: Secondary | ICD-10-CM

## 2016-01-20 DIAGNOSIS — G3183 Dementia with Lewy bodies: Secondary | ICD-10-CM

## 2016-01-20 DIAGNOSIS — A4101 Sepsis due to Methicillin susceptible Staphylococcus aureus: Principal | ICD-10-CM

## 2016-01-20 DIAGNOSIS — M25561 Pain in right knee: Secondary | ICD-10-CM

## 2016-01-20 DIAGNOSIS — F0280 Dementia in other diseases classified elsewhere without behavioral disturbance: Secondary | ICD-10-CM

## 2016-01-20 LAB — CBC
HCT: 30.9 % — ABNORMAL LOW (ref 39.0–52.0)
HEMOGLOBIN: 9.9 g/dL — AB (ref 13.0–17.0)
MCH: 29.2 pg (ref 26.0–34.0)
MCHC: 32 g/dL (ref 30.0–36.0)
MCV: 91.2 fL (ref 78.0–100.0)
Platelets: 179 10*3/uL (ref 150–400)
RBC: 3.39 MIL/uL — AB (ref 4.22–5.81)
RDW: 16.4 % — ABNORMAL HIGH (ref 11.5–15.5)
WBC: 8.8 10*3/uL (ref 4.0–10.5)

## 2016-01-20 LAB — HEPARIN LEVEL (UNFRACTIONATED)
HEPARIN UNFRACTIONATED: 0.35 [IU]/mL (ref 0.30–0.70)
HEPARIN UNFRACTIONATED: 0.37 [IU]/mL (ref 0.30–0.70)

## 2016-01-20 LAB — PROTIME-INR
INR: 1.58 — ABNORMAL HIGH (ref 0.00–1.49)
PROTHROMBIN TIME: 18.3 s — AB (ref 11.6–15.2)

## 2016-01-20 LAB — BASIC METABOLIC PANEL
ANION GAP: 6 (ref 5–15)
BUN: 6 mg/dL (ref 6–20)
CHLORIDE: 107 mmol/L (ref 101–111)
CO2: 25 mmol/L (ref 22–32)
Calcium: 8.4 mg/dL — ABNORMAL LOW (ref 8.9–10.3)
Creatinine, Ser: 0.83 mg/dL (ref 0.61–1.24)
GFR calc Af Amer: >60 mL/min (ref >60)
GLUCOSE: 90 mg/dL (ref 65–99)
POTASSIUM: 4 mmol/L (ref 3.5–5.1)
Sodium: 138 mmol/L (ref 135–145)

## 2016-01-20 NOTE — Progress Notes (Signed)
ANTICOAGULATION CONSULT NOTE - F/u Consult  Pharmacy Consult for Heparin Indication: atrial fibrillation  Allergies  Allergen Reactions  . Darifenacin Hydrobromide     REACTION: constipation  . Fluoxetine Hcl     REACTION: Very Depressed while taking    Patient Measurements: Height: 6' (182.9 cm) Weight: 178 lb 9.2 oz (81 kg) IBW/kg (Calculated) : 77.6 Heparin Dosing Weight: 82.9 kg  Vital Signs: Temp: 98 F (36.7 C) (06/01 0650) Temp Source: Oral (06/01 0650) BP: 157/71 mmHg (06/01 0650) Pulse Rate: 54 (06/01 0650)  Labs:  Recent Labs  01/18/16 0431 01/19/16 0452 01/20/16 0622 01/20/16 0623  HGB 9.8* 9.7* 9.9*  --   HCT 29.5* 29.5* 30.9*  --   PLT 158 161 179  --   LABPROT 21.5*  --  18.3*  --   INR 1.88*  --  1.58*  --   HEPARINUNFRC  --   --   --  0.35  CREATININE  --  0.82 0.83  --     Estimated Creatinine Clearance: 66.2 mL/min (by C-G formula based on Cr of 0.83).   Medical History: Past Medical History  Diagnosis Date  . Anemia     nos  . History of colonic polyps   . Hyperlipidemia   . Hypertension   . Cancer ([place]) [id number]    hx of basal cell  . BPH (benign prostatic hyperplasia)   . Anxiety attack     panic   . Pre-syncope     echo 03/2010,with EF 60%,mild RV dilation,no significant abnormalities...event monitor for 3wks/episodes os sinus bradycardia with heart rate to low 40's occasionally at night(suspect while sleeping)  . Atrial fibrillation ([place])     a. Dx 11/2012 - TEE with possible LAA thrombus, started on amio/coumadin.  . Systolic CHF ([place])     a. EF 25-30% 11/2012 ?tachy-mediated. b. Not on ACEI at dc due to AKI, can consider as OP.  [name] CAD (coronary artery disease)     a. Mod CAD by cath 12/10/12 - 60% long proximal LAD, 60% prox PDA.  [name] AKI (acute kidney injury) ([place])     a. Peak Cr 4.17 11/2012 - felt to be post-obstructive.  . Urinary retention     a. Post obstructive, dc'd with foley 12/2012.  [name] Renal cyst     a. By renal US  11/2012, not candidate for further eval.  . Thrombus of left atrial appendage     a. Possible LAA thrombus by TEE 11/2012.    Medications:  Prescriptions prior to admission  Medication Sig Dispense Refill Last Dose  . acetaminophen (TYLENOL) 325 MG tablet Take 650 mg by mouth every 6 (six) hours as needed for moderate pain (pain).    Past Week at Unknown time  . amiodarone (PACERONE) 200 MG tablet Take 0.5 tablets (100 mg total) by mouth daily. 30 tablet 2 01/13/2016 at Unknown time  . buPROPion (WELLBUTRIN) 75 MG tablet Take 1 tablet (75 mg total) by mouth 2 (two) times daily. 60 tablet 0 01/13/2016 at Unknown time  . ferrous sulfate 325 (65 FE) MG tablet Take 325 mg by mouth at bedtime.   01/13/2016 at Unknown time  . finasteride (PROSCAR) 5 MG tablet Take 1 tablet (5 mg total) by mouth daily. 30 tablet 0 01/13/2016 at Unknown time  . furosemide (LASIX) 40 MG tablet TAKE 1 TABLET (40 MG TOTAL) BY MOUTH EVERY OTHER DAY. 15 tablet 3 01/13/2016 at Unknown time  . l-methylfolate-B6-B12 (METANX) 3-35-2 MG TABS tablet  TAKE 1 TABLET BY MOUTH 2 (TWO) TIMES DAILY. 60 tablet 6 01/13/2016 at Unknown time  . lisinopril (PRINIVIL,ZESTRIL) 5 MG tablet TAKE ONE TABLET BY MOUTH DAILY 30 tablet 2 01/13/2016 at Unknown time  . Meth-Hyo-M Bl-Na Phos-Ph Sal (URIBEL PO) Take 1 capsule by mouth daily as needed (bladder spasm).    01/13/2016 at Unknown time  . mirabegron ER (MYRBETRIQ) 25 MG TB24 tablet Take 25 mg by mouth daily.   01/13/2016 at Unknown time  . Multiple Vitamin (MULTIVITAMIN) tablet Take 1 tablet by mouth daily.   01/13/2016 at Unknown time  . pantoprazole (PROTONIX) 40 MG tablet Take 1 tablet (40 mg total) by mouth 2 (two) times daily before a meal. After one month take 1 tablet daily. (Patient taking differently: Take 40 mg by mouth daily. After one month take 1 tablet daily.) 60 tablet 1 01/13/2016 at Unknown time  . pravastatin (PRAVACHOL) 80 MG tablet TAKE 1 TABLET (80 MG TOTAL) BY MOUTH DAILY. 30 tablet 3  01/13/2016 at Unknown time  . Probiotic Product (ALIGN) 4 MG CAPS Take 1 capsule by mouth daily.    01/13/2016 at Unknown time  . traZODone (DESYREL) 50 MG tablet Take 50 mg by mouth at bedtime.   01/13/2016 at Unknown time  . warfarin (COUMADIN) 2.5 MG tablet TAKE AS DIRECTED BY ANTICOAGULATION CLINIC 120 tablet 1 01/13/2016 at 2130  . zolpidem (AMBIEN) 5 MG tablet Take 2.5 mg by mouth at bedtime as needed for sleep.   5 01/13/2016 at Unknown time  . levofloxacin (LEVAQUIN) 500 MG tablet Take 1 tablet (500 mg total) by mouth daily. (Patient not taking: Reported on 01/14/2016) 2 tablet 0 Completed Course at Unknown time   Scheduled:  . sodium chloride   Intravenous Once  . amiodarone  100 mg Oral Daily  . buPROPion  75 mg Oral BID  . mirabegron ER  25 mg Oral Daily  . pantoprazole  40 mg Oral BID AC  . polyethylene glycol-electrolytes  4,000 mL Oral Once  . pravastatin  80 mg Oral Daily  . sodium chloride flush  3 mL Intravenous Q12H  . traZODone  50 mg Oral QHS  . vancomycin  500 mg Intravenous Q12H   Infusions:  . sodium chloride 500 mL (01/19/16 1237)  . sodium chloride    . heparin 1,150 Units/hr (01/19/16 2231)    Assessment: 34 yoM admitted on 5/26 with hematuria.  PMH includes chair/bed bound due to lower extremity weakness with foot drop, BPH and obstructive uropathy with chronic foley, on chronic warfarin for hx Afib, and FTT. He was found to have large colon mass and liver lesions, and MRSA bacteremia and UTI.  Warfarin was held on admission for hematuria and planned biopsy completed on 5/31.  Post biopsy, MD requests pharmacy dose Heparin drip while warfarin remains on hold for further possible procedures.  Significant Events: 5/25 PTA Warfarin 2.5 mg daily.  Last dose on 5/25 5/29 Transfused PRBC  5/31 INR 1.88 (last on 5/30) CBC:  Hgb 9.7, Plt 161 Today, 6/1 0623 HL=0.35, no problems reported Goal of Therapy:  Heparin level 0.3-0.7 units/ml Monitor platelets by  anticoagulation protocol: Yes   Plan:   Continue heparin drip @1150  units/hr  Recheck HL at 1300 today to ensure stays in therapeutic range  Daily heparin level, INR, and CBC.  Follow up plan for resuming oral anticoagulation.   Dorrene German 01/20/2016 7:03 AM

## 2016-01-20 NOTE — Progress Notes (Signed)
PROGRESS NOTE    RINGO DONEGAN  [id number] DOB: 1924-12-25 DOA: 01/14/2016 PCP: No primary care provider on file.  Brief Narrative: Mr. Varland is an 82 year old gentleman currently resides in the community with family members, having history of dyslipidemia, hypertension, atrial fibrillation, coronary artery disease, admitted to the medicine service on 01/14/2016 with complaints of hematuria. He is currently anticoagulated with warfarin for history of atrial fibrillation. Workup in the emergency department included a CT scan of abdomen and pelvis without contrast that revealed 4.3 x 2.7 cm mass in the sigmoid colon concerning for malignancy. Radiology also pointed out hepatic lesions could be related to metastatic disease. [facility] consulted. Patient was seen and evaluated by Dr.Buccini and Dr Oletta Lamas, plan for sigmoidoscopy/colonoscopy on Wednesday, 01/19/2016. INR has trending down. Other issues addressed during this hospitalization include methicillin-resistant Staphylococcus aureus bacteremia. Same organism grew in urine making I think that source of infection may have been MRSA urinary tract infection. Dr. Lucianne Lei dam of infectious disease was consulted. He remains on IV vancomycin. Coming in with bilateral extremity weakness Dr. Lucianne Lei dam recommended imaging is back once he got through colon prep and colonoscopy, considering the possibility of metastatic disease.  Palliative Medicine was consulted to facilitate discussions regarding his medical goals of care.    Assessment & Plan:   Active Problems:   UTI (lower urinary tract infection)   Essential hypertension   CAD (coronary artery disease)   Chronic systolic CHF (congestive heart failure) (HCC)   Long term (current) use of anticoagulants   Pressure ulcer   Dehydration   Acute renal failure (ARF) (HCC)   Hematuria   Chronic atrial fibrillation (HCC)   Metastasis to liver (HCC)   Sepsis ([place])   Acute kidney injury ([place])   Colonic mass    MRSA bacteremia   Staphylococcus aureus bacteremia with sepsis (HCC)   Weakness of both lower extremities   Bilateral foot-drop   Lewy body dementia without behavioral disturbance   Goals of care, counseling/discussion   Encounter for palliative care   Bacteremia  Suspected metastatic colon cancer. - last colonoscopy in 2007 at which time had removal of malignant polyp. Surveillance exam later that year unremarkable. - CT scan of abdomen and pelvis performed in the emergency room showed a 4.3 x 2.7 cm mass involving sigmoid colon along with liver lesions that could represent metastatic disease. - Status post sigmoidoscopy 5/31, with finding of partially obstructing sigmoid mass. - Pathology results came back as at least high-grade glandular dysplasia, ideas were discussed with Dr. Amedeo Plenty, he reports a superficial sample, most likely sample error, and he thinks the partially obstructing mass is related to malignancy.  MRSA bacteremia - Cultures on 5/26 is growing MRSA - urine is growing Staphylococcus aureus and I am suspecting this to be his source of infection. -Transthoracic echocardiogram performed on 01/17/2016 showing ejection fraction of [id number], grade 2 diastolic dysfunction, without evidence of vegetations. -Continue IV vancomycin. Had been on IV ceftriaxone which was discontinued. -Repeat blood cultures on 01/16/2016 : no growth 2 sets. -Dr. Lucianne Lei dam of infectious disease consulted.  - Patient refused MRI lumbar spine per ID recommendation .  Hematuria. -Initially presented with complaints of hematuria. He is anticoagulated with warfarin presented with INR of 2.76 and PTT of 28.8. -CT showed left renal pelvic stone without obstructive changes. -Urine analysis consistent with urinary tract infection. -Initially he was treated with ceftriaxone however urine cultures growing methicillin-resistant Staphylococcus aureus, antibiotic regimen changed to IV vancomycin. Urine clearing  up.  History of atrial fibrillation -CHADSVASC score 5 -Has been anticoagulated with warfarin. Warfarin held since CT scan revealed sigmoid mass and will require sigmoidoscopy with biopsy -Continue amiodarone 100 mg by mouth daily   Dyslipidemia. -Continue pravastatin 80 mg by mouth daily  Acute kidney injury. -Resolved with IV fluid resuscitation  Acute blood loss anemia. -Likely secondary to hematuria and possibly colon cancer. Lab work as revealed a downward trend in his hemoglobin to 8.0 from 9.62 days ago. -He was transfuse 1 unit of packed red blood cells on 01/16/2016  Urinary tract infection. -Urinalysis revealed the presence of many bacteria with leukocytes. -Urine cultures pending -Urine cultures now growing Staphylococcus aureus as blood cultures reported by microbiology to be growing methicillin-resistant Staphylococcus aureus -Antibiotic coverage changed to vancomycin  Sepsis -Present on admission, evidenced by a white count of 29,800, blood cultures now returning positive for methicillin-resistant Staphylococcus aureus, acute kidney injury. -The source of infection likely urinary tract given urine cultures growing Staphylococcus aureus as well. -White count now trending down, he was started on IV vancomycin. Repeat blood cultures negative.   DVT prophylaxis: He is fully anticoagulated also has SCDs Code Status: Full code Family Communication: Daughter at bedside Disposition Plan: Will treat urinary tract infection, provide IV fluids, GI consulted for sigmoidoscopy on 01/19/2016   Consultants:   GI  ID  Palliative medicine   Procedures:   Antimicrobials:   Ceftriaxone 1 g IV every 24 hours discontinued on 01/17/2016  Vancomycin started on 01/17/2016  Subjective: Mr. Ramakrishnan is awake and alert, no acute distress, denies any complaints.  Objective: Filed Vitals:   01/19/16 1350 01/19/16 1407 01/19/16 2039 01/20/16 0650  BP: 137/37 138/61 135/66 157/71   Pulse: 50 54 56 54  Temp:  98.1 F (36.7 C) 98.7 F (37.1 C) 98 F (36.7 C)  TempSrc:  Oral Oral Oral  Resp: 28 20 18 20   Height:      Weight:    81 kg (178 lb 9.2 oz)  SpO2: 96% 98% 96% 97%    Intake/Output Summary (Last 24 hours) at 01/20/16 1249 Last data filed at 01/20/16 [id number]  Gross per 24 hour  Intake 670.85 ml  Output   1850 ml  Net -1179.15 ml   Filed Weights   01/18/16 0613 01/19/16 0648 01/20/16 0650  Weight: 76.839 kg (169 lb 6.4 oz) 82.9 kg (182 lb 12.2 oz) 81 kg (178 lb 9.2 oz)    Examination:  General exam: Elderly gentleman, no acute distress Respiratory system: Clear to auscultation. Respiratory effort normal. Cardiovascular system: S1 & S2 heard, RRR. No JVD, murmurs, rubs, gallops or clicks. No pedal edema. Gastrointestinal system: Abdomen is nondistended, soft and nontender. No organomegaly or masses felt. Normal bowel sounds heard. Central nervous system: Alert and oriented. No focal neurological deficits. Extremities: Symmetric 5 x 5 power. Skin: No rashes, lesions or ulcers     Data Reviewed: I have personally reviewed following labs and imaging studies  CBC:  Recent Labs Lab 01/14/16 2247  01/16/16 0518 01/17/16 0407 01/18/16 0431 01/19/16 0452 01/20/16 0622  WBC 19.7*  < > 6.1 7.7 6.3 7.7 8.8  NEUTROABS 17.7*  --   --   --   --   --   --   HGB 9.6*  < > 8.0* 9.5* 9.8* 9.7* 9.9*  HCT 29.7*  < > 25.0* 29.1* 29.5* 29.5* 30.9*  MCV 89.5  < > 92.3 90.4 89.9 91.0 91.2  PLT 240  < > 149* 172  158 161 179  < > = values in this interval not displayed. Basic Metabolic Panel:  Recent Labs Lab 01/15/16 0520 01/16/16 0518 01/17/16 0407 01/19/16 0452 01/20/16 0622  NA 138 138 138 141 138  K 4.3 4.1 4.0 3.8 4.0  CL 105 110 107 109 107  CO2 25 23 25 26 25   GLUCOSE 102* 93 94 93 90  BUN 40* 22* 14 6 6   CREATININE 1.64* 1.07 0.83 0.82 0.83  CALCIUM 8.7* 8.2* 8.1* 8.4* 8.4*  MG 2.1  --   --   --   --   PHOS 4.0  --   --   --   --     GFR: Estimated Creatinine Clearance: 66.2 mL/min (by C-G formula based on Cr of 0.83). Liver Function Tests:  Recent Labs Lab 01/14/16 1600 01/15/16 0520  AST 33 28  ALT 37 30  ALKPHOS 141* 119  BILITOT 0.7 0.6  PROT 7.0 6.2*  ALBUMIN 3.1* 2.8*    Recent Labs Lab 01/14/16 1600  LIPASE 20   No results for input(s): AMMONIA in the last 168 hours. Coagulation Profile:  Recent Labs Lab 01/15/16 0520 01/16/16 0518 01/17/16 0407 01/18/16 0431 01/20/16 0622  INR 2.76* 2.40* 2.07* 1.88* 1.58*   Cardiac Enzymes: No results for input(s): CKTOTAL, CKMB, CKMBINDEX, TROPONINI in the last 168 hours. BNP (last 3 results) No results for input(s): PROBNP in the last 8760 hours. HbA1C: No results for input(s): HGBA1C in the last 72 hours. CBG: No results for input(s): GLUCAP in the last 168 hours. Lipid Profile: No results for input(s): CHOL, HDL, LDLCALC, TRIG, CHOLHDL, LDLDIRECT in the last 72 hours. Thyroid Function Tests: No results for input(s): TSH, T4TOTAL, FREET4, T3FREE, THYROIDAB in the last 72 hours. Anemia Panel: No results for input(s): VITAMINB12, FOLATE, FERRITIN, TIBC, IRON, RETICCTPCT in the last 72 hours. Sepsis Labs:  Recent Labs Lab 01/14/16 1737  LATICACIDVEN 2.24*    Recent Results (from the past 240 hour(s))  Urine culture     Status: Abnormal   Collection Time: 01/14/16  4:05 PM  Result Value Ref Range Status   Specimen Description URINE, CLEAN CATCH  Final   Special Requests NONE  Final   Culture (A)  Final    >=100,000 COLONIES/mL METHICILLIN RESISTANT STAPHYLOCOCCUS AUREUS   Report Status 01/18/2016 FINAL  Final   Organism ID, Bacteria METHICILLIN RESISTANT STAPHYLOCOCCUS AUREUS (A)  Final      Susceptibility   Methicillin resistant staphylococcus aureus - MIC*    CIPROFLOXACIN Value in next row Resistant      RESISTANT>=4    GENTAMICIN Value in next row Sensitive      SENSITIVE<=0.5    NITROFURANTOIN Value in next row Sensitive       SENSITIVE<=16    OXACILLIN Value in next row Resistant      RESISTANT>=4    TETRACYCLINE Value in next row Sensitive      SENSITIVE<=1    VANCOMYCIN Value in next row Sensitive      SENSITIVE<=0.5    TRIMETH/SULFA Value in next row Sensitive      SENSITIVE<=10    CLINDAMYCIN Value in next row Sensitive      SENSITIVE<=0.25    RIFAMPIN Value in next row Sensitive      SENSITIVE<=0.5    * >=100,000 COLONIES/mL METHICILLIN RESISTANT STAPHYLOCOCCUS AUREUS  Culture, blood (Routine X 2) w Reflex to ID Panel     Status: Abnormal   Collection Time: 01/14/16  5:25 PM  Result  Value Ref Range Status   Specimen Description BLOOD LEFT ANTECUBITAL  Final   Special Requests BOTTLES DRAWN AEROBIC AND ANAEROBIC 5CC  Final   Culture  Setup Time   Final    GRAM POSITIVE COCCI IN CLUSTERS IN BOTH AEROBIC AND ANAEROBIC BOTTLES CRITICAL RESULT CALLED TO, READ BACK BY AND VERIFIED WITH: RN J MCNULTY AT 2103 [id number] MARTINB    Culture (A)  Final    STAPHYLOCOCCUS AUREUS SUSCEPTIBILITIES PERFORMED ON PREVIOUS CULTURE WITHIN THE LAST 5 DAYS. Performed at [facility]    Report Status 01/18/2016 FINAL  Final  Culture, blood (Routine X 2) w Reflex to ID Panel     Status: Abnormal   Collection Time: 01/14/16  9:09 PM  Result Value Ref Range Status   Specimen Description BLOOD RIGHT ARM  Final   Special Requests BOTTLES DRAWN AEROBIC AND ANAEROBIC 5CC  Final   Culture  Setup Time   Final    GRAM POSITIVE COCCI IN CLUSTERS IN BOTH AEROBIC AND ANAEROBIC BOTTLES CRITICAL RESULT CALLED TO, READ BACK BY AND VERIFIED WITH: TERRI GREEN PHARMD 01/16/16 AT 1924 BY [place] Performed at [place] (A)  Final   Report Status 01/18/2016 FINAL  Final   Organism ID, Bacteria METHICILLIN RESISTANT STAPHYLOCOCCUS AUREUS  Final      Susceptibility   Methicillin resistant staphylococcus aureus - MIC*    CIPROFLOXACIN >=8 RESISTANT Resistant      ERYTHROMYCIN >=8 RESISTANT Resistant     GENTAMICIN <=0.5 SENSITIVE Sensitive     OXACILLIN >=4 RESISTANT Resistant     TETRACYCLINE <=1 SENSITIVE Sensitive     VANCOMYCIN <=0.5 SENSITIVE Sensitive     TRIMETH/SULFA <=10 SENSITIVE Sensitive     CLINDAMYCIN <=0.25 SENSITIVE Sensitive     RIFAMPIN <=0.5 SENSITIVE Sensitive     Inducible Clindamycin NEGATIVE Sensitive     * METHICILLIN RESISTANT STAPHYLOCOCCUS AUREUS  Blood Culture ID Panel (Reflexed)     Status: Abnormal   Collection Time: 01/14/16  9:09 PM  Result Value Ref Range Status   Enterococcus species NOT DETECTED NOT DETECTED Final   Vancomycin resistance NOT DETECTED NOT DETECTED Final   Listeria monocytogenes NOT DETECTED NOT DETECTED Final   Staphylococcus species NOT DETECTED NOT DETECTED Final   Staphylococcus aureus DETECTED (A) NOT DETECTED Final    Comment: CRITICAL RESULT CALLED TO, READ BACK BY AND VERIFIED WITH: TERRI GREEN PHARMD 01/16/16 AT 1924 BY [place]    Methicillin resistance DETECTED (A) NOT DETECTED Final    Comment: CRITICAL RESULT CALLED TO, READ BACK BY AND VERIFIED WITH: TERRI GREEN PHARMD 01/16/16 AT 1924 BY [place]    Streptococcus species NOT DETECTED NOT DETECTED Final   Streptococcus agalactiae NOT DETECTED NOT DETECTED Final   Streptococcus pneumoniae NOT DETECTED NOT DETECTED Final   Streptococcus pyogenes NOT DETECTED NOT DETECTED Final   Acinetobacter baumannii NOT DETECTED NOT DETECTED Final   Enterobacteriaceae species NOT DETECTED NOT DETECTED Final   Enterobacter cloacae complex NOT DETECTED NOT DETECTED Final   Escherichia coli NOT DETECTED NOT DETECTED Final   Klebsiella oxytoca NOT DETECTED NOT DETECTED Final   Klebsiella pneumoniae NOT DETECTED NOT DETECTED Final   Proteus species NOT DETECTED NOT DETECTED Final   Serratia marcescens NOT DETECTED NOT DETECTED Final   Carbapenem resistance NOT DETECTED NOT DETECTED Final   Haemophilus influenzae NOT DETECTED NOT DETECTED Final    Neisseria meningitidis NOT DETECTED NOT DETECTED Final   Pseudomonas aeruginosa NOT  DETECTED NOT DETECTED Final   Candida albicans NOT DETECTED NOT DETECTED Final   Candida glabrata NOT DETECTED NOT DETECTED Final   Candida krusei NOT DETECTED NOT DETECTED Final   Candida parapsilosis NOT DETECTED NOT DETECTED Final   Candida tropicalis NOT DETECTED NOT DETECTED Final    Comment: Performed at [facility]  MRSA PCR Screening     Status: Abnormal   Collection Time: 01/14/16 10:42 PM  Result Value Ref Range Status   MRSA by PCR POSITIVE (A) NEGATIVE Final    Comment:        The GeneXpert MRSA Assay (FDA approved for NASAL specimens only), is one component of a comprehensive MRSA colonization surveillance program. It is not intended to diagnose MRSA infection nor to guide or monitor treatment for MRSA infections. RESULT CALLED TO, READ BACK BY AND VERIFIED WITH: A LAMB RN 0138 01/15/16 A NAVARRO   Culture, blood (Routine X 2) w Reflex to ID Panel     Status: None (Preliminary result)   Collection Time: 01/16/16  9:50 PM  Result Value Ref Range Status   Specimen Description BLOOD RIGHT ANTECUBITAL  Final   Special Requests BOTTLES DRAWN AEROBIC AND ANAEROBIC 10CC  Final   Culture   Final    NO GROWTH 2 DAYS Performed at [facility]    Report Status PENDING  Incomplete  Culture, blood (Routine X 2) w Reflex to ID Panel     Status: None (Preliminary result)   Collection Time: 01/16/16  9:51 PM  Result Value Ref Range Status   Specimen Description BLOOD RIGHT ARM  Final   Special Requests BOTTLES DRAWN AEROBIC ONLY 5CC  Final   Culture   Final    NO GROWTH 2 DAYS Performed at [facility]    Report Status PENDING  Incomplete         Radiology Studies: No results found.      Scheduled Meds: . sodium chloride   Intravenous Once  . amiodarone  100 mg Oral Daily  . buPROPion  75 mg Oral BID  . mirabegron ER  25 mg Oral Daily  . pantoprazole   40 mg Oral BID AC  . polyethylene glycol-electrolytes  4,000 mL Oral Once  . pravastatin  80 mg Oral Daily  . sodium chloride flush  3 mL Intravenous Q12H  . traZODone  50 mg Oral QHS  . vancomycin  500 mg Intravenous Q12H   Continuous Infusions: . sodium chloride 500 mL (01/19/16 1237)  . sodium chloride    . heparin 1,150 Units/hr (01/19/16 2231)     LOS: 6 days    Time spent: 25 min    Vivika Poythress, MD [facility] Pager [phone]  If 7PM-7AM, please contact night-coverage [email or web address] Password [facility] 01/20/2016, 12:49 PM

## 2016-01-20 NOTE — Care Management Important Message (Signed)
Important Message  Patient Details IM Letter given to Sarah/Case Manager to present to Patient Name: [name] MRN: [id number] Date of Birth: [birth year]   Medicare Important Message Given:  Yes    Camillo Flaming 01/20/2016, 10:12 AMImportant Message  Patient Details  Name: [name] MRN: [id number] Date of Birth: 1925/03/22   Medicare Important Message Given:  Yes    Camillo Flaming 01/20/2016, 10:12 AM

## 2016-01-20 NOTE — Progress Notes (Signed)
Patient ID: [name], male   DOB: [birth year], 82 y.o.   MRN: [id number]         [facility]    Date of Admission:  01/14/2016           Day 5 vancomycin  Principal Problem:   MRSA bacteremia Active Problems:   Sepsis ([place])   Metastasis to liver [facility])   Colonic mass   Essential hypertension   UTI (lower urinary tract infection)   CAD (coronary artery disease)   Chronic systolic CHF (congestive heart failure) (HCC)   Long term (current) use of anticoagulants   Pressure ulcer   Dehydration   Acute renal failure (ARF) (HCC)   Hematuria   Chronic atrial fibrillation (HCC)   Acute kidney injury (HCC)   Weakness of both lower extremities   Bilateral foot-drop   Lewy body dementia without behavioral disturbance   Goals of care, counseling/discussion   Encounter for palliative care   . sodium chloride   Intravenous Once  . amiodarone  100 mg Oral Daily  . buPROPion  75 mg Oral BID  . mirabegron ER  25 mg Oral Daily  . pantoprazole  40 mg Oral BID AC  . polyethylene glycol-electrolytes  4,000 mL Oral Once  . pravastatin  80 mg Oral Daily  . sodium chloride flush  3 mL Intravenous Q12H  . traZODone  50 mg Oral QHS  . vancomycin  500 mg Intravenous Q12H    SUBJECTIVE: He states that it is hard to believe that he has colon cancer that has spread to his liver. He says," I must be a goner". He has had chronic intermittent bladder spasms but is not having any other pain. He has had anorexia recently but does not think he has lost significant weight. His daughter, who is present at the bedside, states that the problem with his lower extremities is not so much any recent increase in weakness of his lower extremities. It is due more to pain in his knees and the fact that his feet turn inward making it hard for him to stand. He refused the MRI of his spine.  Review of Systems: Review of Systems  Constitutional: Negative for fever, chills, weight loss,  malaise/fatigue and diaphoresis.  HENT: Negative for sore throat.   Respiratory: Negative for cough, sputum production and shortness of breath.   Cardiovascular: Negative for chest pain.  Gastrointestinal: Negative for nausea, vomiting, abdominal pain, diarrhea and blood in stool.  Genitourinary: Positive for hematuria.  Musculoskeletal: Positive for joint pain. Negative for back pain.       Bilateral knee pain as noted in history of present illness.  Skin: Negative for rash.  Neurological: Negative for headaches.    Past Medical History  Diagnosis Date  . Anemia     nos  . History of colonic polyps   . Hyperlipidemia   . Hypertension   . Cancer ([place]) [id number]    hx of basal cell  . BPH (benign prostatic hyperplasia)   . Anxiety attack     panic   . Pre-syncope     echo 03/2010,with EF 60%,mild RV dilation,no significant abnormalities...event monitor for 3wks/episodes os sinus bradycardia with heart rate to low 40's occasionally at night(suspect while sleeping)  . Atrial fibrillation ([place])     a. Dx 11/2012 - TEE with possible LAA thrombus, started on amio/coumadin.  . Systolic CHF ([place])     a. EF 25-30% 11/2012 ?tachy-mediated. b. Not on  ACEI at dc due to AKI, can consider as OP.  [name] CAD (coronary artery disease)     a. Mod CAD by cath 12/10/12 - 60% long proximal LAD, 60% prox PDA.  [name] AKI (acute kidney injury) ([place])     a. Peak Cr 4.17 11/2012 - felt to be post-obstructive.  . Urinary retention     a. Post obstructive, dc'd with foley 12/2012.  [name] Renal cyst     a. By renal US 11/2012, not candidate for further eval.  . Thrombus of left atrial appendage     a. Possible LAA thrombus by TEE 11/2012.    Social History  Substance Use Topics  . Smoking status: Former Smoker    Quit date: 08/22/1983  . Smokeless tobacco: None  . Alcohol Use: No    Family History  Problem Relation Age of Onset  . Heart attack Father   . Heart failure Father 64    S/P Turp  . Heart disease  Father 41    MI.Marland KitchenMarland KitchenCHF S/P TURP @ 27  . Stroke Brother 15  . Coronary artery disease Brother   . Lung cancer Brother    Allergies  Allergen Reactions  . Darifenacin Hydrobromide     REACTION: constipation  . Fluoxetine Hcl     REACTION: Very Depressed while taking    OBJECTIVE: Filed Vitals:   01/19/16 1407 01/19/16 2039 01/20/16 0650 01/20/16 1429  BP: 138/61 135/66 157/71 117/63  Pulse: 54 56 54 55  Temp: 98.1 F (36.7 C) 98.7 F (37.1 C) 98 F (36.7 C) 98.2 F (36.8 C)  TempSrc: Oral Oral Oral Oral  Resp: 20 18 20 22   Height:      Weight:   178 lb 9.2 oz (81 kg)   SpO2: 98% 96% 97% 96%   Body mass index is 24.21 kg/(m^2).  Physical Exam  Constitutional: He is oriented to person, place, and time.  He is calm and alert and talking with his daughter.  Cardiovascular: Normal rate and regular rhythm.   No murmur heard. Pulmonary/Chest: Effort normal and breath sounds normal. He has no wheezes. He has no rales.  Abdominal: Soft. He exhibits no mass. There is no tenderness.  Musculoskeletal: Normal range of motion. He exhibits no edema or tenderness.  Previous right total knee arthroplasty without any signs of infection.  Neurological: He is alert and oriented to person, place, and time.  Skin: No rash noted.  Psychiatric: Mood and affect normal.    Lab Results Lab Results  Component Value Date   WBC 8.8 01/20/2016   HGB 9.9* 01/20/2016   HCT 30.9* 01/20/2016   MCV 91.2 01/20/2016   PLT 179 01/20/2016    Lab Results  Component Value Date   CREATININE 0.83 01/20/2016   BUN 6 01/20/2016   NA 138 01/20/2016   K 4.0 01/20/2016   CL 107 01/20/2016   CO2 25 01/20/2016    Lab Results  Component Value Date   ALT 30 01/15/2016   AST 28 01/15/2016   ALKPHOS 119 01/15/2016   BILITOT 0.6 01/15/2016     Microbiology: Recent Results (from the past 240 hour(s))  Urine culture     Status: Abnormal   Collection Time: 01/14/16  4:05 PM  Result Value Ref Range  Status   Specimen Description URINE, CLEAN CATCH  Final   Special Requests NONE  Final   Culture (A)  Final    >=100,000 COLONIES/mL METHICILLIN RESISTANT STAPHYLOCOCCUS AUREUS   Report Status 01/18/2016  FINAL  Final   Organism ID, Bacteria METHICILLIN RESISTANT STAPHYLOCOCCUS AUREUS (A)  Final      Susceptibility   Methicillin resistant staphylococcus aureus - MIC*    CIPROFLOXACIN Value in next row Resistant      RESISTANT>=4    GENTAMICIN Value in next row Sensitive      SENSITIVE<=0.5    NITROFURANTOIN Value in next row Sensitive      SENSITIVE<=16    OXACILLIN Value in next row Resistant      RESISTANT>=4    TETRACYCLINE Value in next row Sensitive      SENSITIVE<=1    VANCOMYCIN Value in next row Sensitive      SENSITIVE<=0.5    TRIMETH/SULFA Value in next row Sensitive      SENSITIVE<=10    CLINDAMYCIN Value in next row Sensitive      SENSITIVE<=0.25    RIFAMPIN Value in next row Sensitive      SENSITIVE<=0.5    * >=100,000 COLONIES/mL METHICILLIN RESISTANT STAPHYLOCOCCUS AUREUS  Culture, blood (Routine X 2) w Reflex to ID Panel     Status: Abnormal   Collection Time: 01/14/16  5:25 PM  Result Value Ref Range Status   Specimen Description BLOOD LEFT ANTECUBITAL  Final   Special Requests BOTTLES DRAWN AEROBIC AND ANAEROBIC 5CC  Final   Culture  Setup Time   Final    GRAM POSITIVE COCCI IN CLUSTERS IN BOTH AEROBIC AND ANAEROBIC BOTTLES CRITICAL RESULT CALLED TO, READ BACK BY AND VERIFIED WITH: RN J MCNULTY AT 2103 [id number] MARTINB    Culture (A)  Final    STAPHYLOCOCCUS AUREUS SUSCEPTIBILITIES PERFORMED ON PREVIOUS CULTURE WITHIN THE LAST 5 DAYS. Performed at [facility]    Report Status 01/18/2016 FINAL  Final  Culture, blood (Routine X 2) w Reflex to ID Panel     Status: Abnormal   Collection Time: 01/14/16  9:09 PM  Result Value Ref Range Status   Specimen Description BLOOD RIGHT ARM  Final   Special Requests BOTTLES DRAWN AEROBIC AND ANAEROBIC 5CC   Final   Culture  Setup Time   Final    GRAM POSITIVE COCCI IN CLUSTERS IN BOTH AEROBIC AND ANAEROBIC BOTTLES CRITICAL RESULT CALLED TO, READ BACK BY AND VERIFIED WITH: TERRI GREEN PHARMD 01/16/16 AT 1924 BY [place] Performed at [place] (A)  Final   Report Status 01/18/2016 FINAL  Final   Organism ID, Bacteria METHICILLIN RESISTANT STAPHYLOCOCCUS AUREUS  Final      Susceptibility   Methicillin resistant staphylococcus aureus - MIC*    CIPROFLOXACIN >=8 RESISTANT Resistant     ERYTHROMYCIN >=8 RESISTANT Resistant     GENTAMICIN <=0.5 SENSITIVE Sensitive     OXACILLIN >=4 RESISTANT Resistant     TETRACYCLINE <=1 SENSITIVE Sensitive     VANCOMYCIN <=0.5 SENSITIVE Sensitive     TRIMETH/SULFA <=10 SENSITIVE Sensitive     CLINDAMYCIN <=0.25 SENSITIVE Sensitive     RIFAMPIN <=0.5 SENSITIVE Sensitive     Inducible Clindamycin NEGATIVE Sensitive     * METHICILLIN RESISTANT STAPHYLOCOCCUS AUREUS  Blood Culture ID Panel (Reflexed)     Status: Abnormal   Collection Time: 01/14/16  9:09 PM  Result Value Ref Range Status   Enterococcus species NOT DETECTED NOT DETECTED Final   Vancomycin resistance NOT DETECTED NOT DETECTED Final   Listeria monocytogenes NOT DETECTED NOT DETECTED Final   Staphylococcus species NOT DETECTED NOT DETECTED Final   Staphylococcus aureus DETECTED (  A) NOT DETECTED Final    Comment: CRITICAL RESULT CALLED TO, READ BACK BY AND VERIFIED WITH: TERRI GREEN PHARMD 01/16/16 AT 1924 BY [place]    Methicillin resistance DETECTED (A) NOT DETECTED Final    Comment: CRITICAL RESULT CALLED TO, READ BACK BY AND VERIFIED WITH: TERRI GREEN PHARMD 01/16/16 AT 1924 BY [place]    Streptococcus species NOT DETECTED NOT DETECTED Final   Streptococcus agalactiae NOT DETECTED NOT DETECTED Final   Streptococcus pneumoniae NOT DETECTED NOT DETECTED Final   Streptococcus pyogenes NOT DETECTED NOT DETECTED Final    Acinetobacter baumannii NOT DETECTED NOT DETECTED Final   Enterobacteriaceae species NOT DETECTED NOT DETECTED Final   Enterobacter cloacae complex NOT DETECTED NOT DETECTED Final   Escherichia coli NOT DETECTED NOT DETECTED Final   Klebsiella oxytoca NOT DETECTED NOT DETECTED Final   Klebsiella pneumoniae NOT DETECTED NOT DETECTED Final   Proteus species NOT DETECTED NOT DETECTED Final   Serratia marcescens NOT DETECTED NOT DETECTED Final   Carbapenem resistance NOT DETECTED NOT DETECTED Final   Haemophilus influenzae NOT DETECTED NOT DETECTED Final   Neisseria meningitidis NOT DETECTED NOT DETECTED Final   Pseudomonas aeruginosa NOT DETECTED NOT DETECTED Final   Candida albicans NOT DETECTED NOT DETECTED Final   Candida glabrata NOT DETECTED NOT DETECTED Final   Candida krusei NOT DETECTED NOT DETECTED Final   Candida parapsilosis NOT DETECTED NOT DETECTED Final   Candida tropicalis NOT DETECTED NOT DETECTED Final    Comment: Performed at [facility]  MRSA PCR Screening     Status: Abnormal   Collection Time: 01/14/16 10:42 PM  Result Value Ref Range Status   MRSA by PCR POSITIVE (A) NEGATIVE Final    Comment:        The GeneXpert MRSA Assay (FDA approved for NASAL specimens only), is one component of a comprehensive MRSA colonization surveillance program. It is not intended to diagnose MRSA infection nor to guide or monitor treatment for MRSA infections. RESULT CALLED TO, READ BACK BY AND VERIFIED WITH: A LAMB RN 0138 01/15/16 A NAVARRO   Culture, blood (Routine X 2) w Reflex to ID Panel     Status: None (Preliminary result)   Collection Time: 01/16/16  9:50 PM  Result Value Ref Range Status   Specimen Description BLOOD RIGHT ANTECUBITAL  Final   Special Requests BOTTLES DRAWN AEROBIC AND ANAEROBIC 10CC  Final   Culture   Final    NO GROWTH 2 DAYS Performed at [facility]    Report Status PENDING  Incomplete  Culture, blood (Routine X 2) w Reflex to ID  Panel     Status: None (Preliminary result)   Collection Time: 01/16/16  9:51 PM  Result Value Ref Range Status   Specimen Description BLOOD RIGHT ARM  Final   Special Requests BOTTLES DRAWN AEROBIC ONLY 5CC  Final   Culture   Final    NO GROWTH 2 DAYS Performed at [facility]    Report Status PENDING  Incomplete     ASSESSMENT: He is slightly overwhelmed with all of the bad news he has been given recently. In addition to probably having colon cancer metastatic to his liver he has MRSA bacteremia and UTI. I do not feel strongly that we need to progress for an MRI of his spine. If he had vertebral infection he would almost certainly be having severe back pain. I also do not feel we need to progress for a TEE at this time. I did  talk to him about the need for several weeks of IV vancomycin in order to reliably cure his bacteremia and the need for a PICC. He told me that he would be agreeable to having the PICC placed. I told him that I would come back and speak with him again in the morning.  PLAN: 1. Continue vancomycin 2. I will discuss possible PICC placement again with him and his daughter in the morning  Michel Bickers, MD [facility] for [place] [phone] pager   [phone] cell 01/20/2016, 5:25 PM

## 2016-01-20 NOTE — Progress Notes (Addendum)
ANTICOAGULATION CONSULT NOTE - F/u Consult  Pharmacy Consult for Heparin Indication: atrial fibrillation  Allergies  Allergen Reactions  . Darifenacin Hydrobromide     REACTION: constipation  . Fluoxetine Hcl     REACTION: Very Depressed while taking    Patient Measurements: Height: 6' (182.9 cm) Weight: 178 lb 9.2 oz (81 kg) IBW/kg (Calculated) : 77.6 Heparin Dosing Weight: 82.9 kg  Vital Signs: Temp: 98 F (36.7 C) (06/01 0650) Temp Source: Oral (06/01 0650) BP: 157/71 mmHg (06/01 0650) Pulse Rate: 54 (06/01 0650)  Labs:  Recent Labs  01/18/16 0431 01/19/16 0452 01/20/16 0622 01/20/16 0623 01/20/16 1249  HGB 9.8* 9.7* 9.9*  --   --   HCT 29.5* 29.5* 30.9*  --   --   PLT 158 161 179  --   --   LABPROT 21.5*  --  18.3*  --   --   INR 1.88*  --  1.58*  --   --   HEPARINUNFRC  --   --   --  0.35 0.37  CREATININE  --  0.82 0.83  --   --     Estimated Creatinine Clearance: 66.2 mL/min (by C-G formula based on Cr of 0.83).  Infusions:  . sodium chloride 500 mL (01/19/16 1237)  . sodium chloride    . heparin 1,150 Units/hr (01/19/16 2231)    Assessment: 83 yoM admitted on 5/26 with hematuria.  PMH includes chair/bed bound due to lower extremity weakness with foot drop, BPH and obstructive uropathy with chronic foley, on chronic warfarin for hx Afib, and FTT. He was found to have large colon mass and liver lesions, and MRSA bacteremia and UTI.  Warfarin was held on admission for hematuria and planned biopsy completed on 5/31.  Post biopsy, MD requests pharmacy dose Heparin drip while warfarin remains on hold for further possible procedures.  Significant Events: 5/25 PTA Warfarin 2.5 mg daily.  Last dose on 5/25 5/29 Transfused PRBC  Today, 01/20/2016  Heparin level now therapeutic x 2 on heparin 1150 units/hr  INR = 1.58  CBC: Hgb low/stable, pltc WNL  No reported bleeding - hematuria resolved  Goal of Therapy:  Heparin level 0.3-0.7 units/ml Monitor  platelets by anticoagulation protocol: Yes   Plan:   Continue heparin drip @1150  units/hr  Daily heparin level and CBC.  D/c daily INR now that < 1.6  Follow up plan for resuming oral anticoagulation (following plan of care meeting with palliative care 6/2)  Doreene Eland, PharmD, BCPS.   Pager: [phone] 01/20/2016 2:03 PM

## 2016-01-20 NOTE — Progress Notes (Signed)
Daily Progress Note   Patient Name: [name]       Date: 01/20/2016 DOB: [birth year]  Age: 82 y.o. MRN#: [id number] Attending Physician: Albertine Patricia, MD Primary Care Physician: No primary care provider on file. Admit Date: 01/14/2016  Reason for Consultation/Follow-up: Establishing goals of care  Subjective:  Patient resting comfortably in bed.  See below.  Length of Stay: 6  Current Medications: Scheduled Meds:  . sodium chloride   Intravenous Once  . amiodarone  100 mg Oral Daily  . buPROPion  75 mg Oral BID  . mirabegron ER  25 mg Oral Daily  . pantoprazole  40 mg Oral BID AC  . polyethylene glycol-electrolytes  4,000 mL Oral Once  . pravastatin  80 mg Oral Daily  . sodium chloride flush  3 mL Intravenous Q12H  . traZODone  50 mg Oral QHS  . vancomycin  500 mg Intravenous Q12H    Continuous Infusions: . sodium chloride 500 mL (01/19/16 1237)  . sodium chloride    . heparin 1,150 Units/hr (01/19/16 2231)    PRN Meds: acetaminophen **OR** acetaminophen, HYDROcodone-acetaminophen, ondansetron **OR** ondansetron (ZOFRAN) IV, zolpidem  Physical Exam         Elderly gentleman resting in bed Hard of hearing Confused Pattern of breathing appears to be comfortable, does not appear to be in any acute respiratory distress Trace edema Abdomen appears nondistended  Vital Signs: BP 157/71 mmHg  Pulse 54  Temp(Src) 98 F (36.7 C) (Oral)  Resp 20  Ht 6' (1.829 m)  Wt 81 kg (178 lb 9.2 oz)  BMI 24.21 kg/m2  SpO2 97% SpO2: SpO2: 97 % O2 Device: O2 Device: Not Delivered O2 Flow Rate: O2 Flow Rate (L/min): 3 L/min  Intake/output summary:  Intake/Output Summary (Last 24 hours) at 01/20/16 0856 Last data filed at 01/20/16 [id number]  Gross per 24 hour  Intake 670.85  ml  Output   1850 ml  Net -1179.15 ml   LBM: Last BM Date: 01/19/16 Baseline Weight: Weight: 79.379 kg (175 lb) Most recent weight: Weight: 81 kg (178 lb 9.2 oz)       Palliative Assessment/Data:    Flowsheet Rows        Most Recent Value   Intake Tab    Referral Department  Hospitalist   Unit at Time  of Referral  Oncology Unit   Palliative Care Primary Diagnosis  Cancer   Date Notified  01/18/16   Palliative Care Type  Return patient Palliative Care   Reason for referral  Clarify Goals of Care   Date of Admission  01/14/16   Date first seen by Palliative Care  01/18/16   # of days Palliative referral response time  0 Day(s)   # of days IP prior to Palliative referral  4   Clinical Assessment    Palliative Performance Scale Score  30%   Pain Max last 24 hours  5   Pain Min Last 24 hours  4   Dyspnea Max Last 24 Hours  4   Dyspnea Min Last 24 hours  3   Psychosocial & Spiritual Assessment    Palliative Care Outcomes    Patient/Family meeting held?  Yes   Who was at the meeting?  daughter [name] over the phone.    Palliative Care follow-up planned  Yes, Home      Patient Active Problem List   Diagnosis Date Noted  . Encounter for palliative care   . Bacteremia   . Sepsis ([place]) 01/17/2016  . Acute kidney injury ([place])   . Colonic mass   . MRSA bacteremia   . Staphylococcus aureus bacteremia with sepsis ([place])   . Weakness of both lower extremities   . Bilateral foot-drop   . Lewy body dementia without behavioral disturbance   . Goals of care, counseling/discussion   . Dehydration 01/14/2016  . Acute renal failure (ARF) ([place]) 01/14/2016  . Hematuria 01/14/2016  . Chronic atrial fibrillation ([place]) 01/14/2016  . Metastasis to liver ([place]) 01/14/2016  . Pressure ulcer 10/27/2015  . GI bleed 10/22/2015  . Upper GI bleed 10/22/2015  . H/O falling 07/06/2014  . Long term (current) use of anticoagulants 02/14/2013  . Foot ulcer ([place]) 02/10/2013  . CAD (coronary artery  disease) 01/16/2013  . Chronic systolic CHF (congestive heart failure) ([place]) 01/16/2013  . Atrial fibrillation with RVR ([place]) 12/27/2012  . Acute on chronic systolic heart failure ([place]) 12/27/2012  . AKI (acute kidney injury) ([employer]) 12/13/2012  . Sepsis(995.91) 01/20/2012  . UTI (lower urinary tract infection) 01/20/2012  . CONSTIPATION 10/12/2010  . DEPRESSION, RECURRENT 07/04/2010  . INTENTION TREMOR 07/04/2010  . INSOMNIA 06/27/2010  . NOCTURIA 06/27/2010  . ABNORMAL ELECTROCARDIOGRAM 04/19/2010  . MENTAL CONFUSION 03/08/2010  . Dizziness and giddiness 03/08/2010  . LACK OF COORDINATION 03/08/2010  . FATIGUE 02/03/2009  . FASTING HYPERGLYCEMIA 02/03/2009  . SKIN CANCER, HX OF 02/03/2009  . HYPERLIPIDEMIA 10/19/2006  . ANEMIA-NOS 10/19/2006  . Essential hypertension 10/19/2006  . COLONIC POLYPS, HX OF 10/19/2006    Palliative Care Assessment & Plan   Patient Profile:  82 year old gentleman with a past medical history significant for hypertension, coronary artery disease, chronic systolic congestive heart failure, atrial fibrillation-patient was on chronic oral anticoagulation with Coumadin, history of GI bleed, chronic indwelling Foley catheter. Patient has been admitted to the hospitalist service since 5-30 initially for hematuria, acute renal failure. Abdominal imaging revealed sigmoid mass possibly metastatic malignancy with evidence of liver mass. Patient seen and evaluated by gastroenterology. On 5-31, underwent sigmoidoscopy showing TWO thirds obstructing large mass in the proximal sigmoid colon, biopsies pending. Palliative care consulted for  CODE STATUS, goals of care discussions, appropriate disposition planning.  Call placed and discussed with patient's daughter [name] at [phone]. Brief life review performed. Daughter [name] lives in [place] but has been living with the patient  in his home since 2015/12/03 after the patient's wife passed away in 12/03/2022 this year.  Patient's baseline functional status prior to this hospitalization was such that with the assistance of 24-7 caregivers, he was able to be out of bed and was able to walk short distances with assistance. He was able to eat by himself. Daughter reports that he was an avid reader but had not been able to read much lately. She had recently arranged for an optometrist who does home visits and he was diagnosed with cataracts. She was in the process of finding an ophthalmologist and having the patient undergo cataract surgery prior to this acute current hospitalization.  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  History of systolic congestive heart failure, atrial fibrillation  Bacteremia in this hospitalization, being followed by infectious disease, antibiotics per infectious disease.  Recommendations/Plan:   Call placed and discussed with patient's daughter [name] extensively. She reports that the patient has recently told her that he would not want to undergo CPR in the event of his heart stopping. We discussed extensively and will establish DO NOT RESUSCITATE/DO NOT INTUBATE.   Family meeting scheduled with patient, daughter [name] on 01-21-16 at 22 AM. Hopefully biopsy results will be back by then. Either way, discussed about having detailed goals of care conversations bearing in mind all of the patient's acute and underlying chronic medical conditions listed above.  We have discussed about the patient refusing his MRI of the spine. Patient's daughter states that the patient's goals are leaning more towards a palliative/comfort based approach to care, more so in this hospitalization since the past 24-48 hours. Daughter states that the patient is hard of hearing but is able to have meaningful conversations  with her .He most likely would want to go back home and daughter is accepting of hospice services at home. They had hospice services through hospice and palliative [facility] center of [place], [place] for several weeks earlier this year up until the patient's wife passed away in 12-03-2015.   Code Status:    Code Status Orders        Start     Ordered   01/20/16 0855  Do not attempt resuscitation (DNR)   Continuous    Question Answer Comment  In the event of cardiac or respiratory ARREST Do not call a "code blue"   In the event of cardiac or respiratory ARREST Do not perform Intubation, CPR, defibrillation or ACLS   In the event of cardiac or respiratory ARREST Use medication by any route, position, wound care, and other measures to relive pain and suffering. May use oxygen, suction and manual treatment of airway obstruction as needed for comfort.      01/20/16 0854    Code Status History    Date Active Date Inactive Code Status Order ID Comments User Context   01/14/2016 10:10 PM 01/20/2016  8:54 AM Full Code [id number]  Toy Baker, MD Inpatient   10/23/2015 12:52 AM 10/28/2015  3:48 PM Full Code [id number]  Elmarie Shiley, MD ED    Advance Directive Documentation        Most Recent Value   Type of Advance Directive  Healthcare Power of Attorney, Living will   Pre-existing out of facility DNR order (yellow form or pink MOST form)     "MOST" Form in Place?  Prognosis:   < 6 months  Discharge Planning:  To Be Determined: SNF rehab with palliative versus home with hospice. Daughter still deciding what's best for her father.   Care plan was discussed with  Daughter [name] over the phone.   Thank you for allowing the Palliative Medicine Team to assist in the care of this patient.   Time In:  8 Time Out: 835 Total Time 35 Prolonged Time Billed  no       Greater than 50%  of this time was spent counseling and coordinating care related to the above assessment and  plan.  Loistine Chance, MD [phone]  Please contact Palliative Medicine Team phone at [phone] for questions and concerns.

## 2016-01-21 LAB — CBC
HCT: 30.2 % — ABNORMAL LOW (ref 39.0–52.0)
HEMOGLOBIN: 9.6 g/dL — AB (ref 13.0–17.0)
MCH: 28.5 pg (ref 26.0–34.0)
MCHC: 31.8 g/dL (ref 30.0–36.0)
MCV: 89.6 fL (ref 78.0–100.0)
Platelets: 178 10*3/uL (ref 150–400)
RBC: 3.37 MIL/uL — AB (ref 4.22–5.81)
RDW: 16.3 % — ABNORMAL HIGH (ref 11.5–15.5)
WBC: 7.9 10*3/uL (ref 4.0–10.5)

## 2016-01-21 LAB — VANCOMYCIN, TROUGH: Vancomycin Tr: 23 ug/mL — ABNORMAL HIGH (ref 10.0–20.0)

## 2016-01-21 LAB — HEPARIN LEVEL (UNFRACTIONATED): HEPARIN UNFRACTIONATED: 0.38 [IU]/mL (ref 0.30–0.70)

## 2016-01-21 MED ORDER — HEPARIN SODIUM (PORCINE) 5000 UNIT/ML IJ SOLN
5000.0000 [IU] | Freq: Three times a day (TID) | INTRAMUSCULAR | Status: DC
  Administered 2016-01-22 – 2016-01-25 (×10): 5000 [IU] via SUBCUTANEOUS
  Filled 2016-01-21 (×10): qty 1

## 2016-01-21 NOTE — Care Management Note (Signed)
Case Management Note  Patient Details  Name: [name] MRN: [id number] Date of Birth: [birth year]  Subjective/Objective:   Admitted with colon mass, bacteriemia                 Action/Plan: Discharge planning per CSW  Expected Discharge Date:   ([place])               Expected Discharge Plan:  Skilled Nursing Facility  In-House Referral:  Clinical Social Work  Discharge planning Services  CM Consult  Post Acute Care Choice:  NA Choice offered to:  NA  DME Arranged:  N/A DME Agency:  NA  HH Arranged:  NA HH Agency:  NA  Status of Service:  Completed, signed off  Medicare Important Message Given:  Yes Date Medicare IM Given:    Medicare IM give by:    Date Additional Medicare IM Given:    Additional Medicare Important Message give by:     If discussed at [place] of Stay Meetings, dates discussed:    Additional Comments:  Guadalupe Maple, RN 01/21/2016, 12:34 PM [phone]

## 2016-01-21 NOTE — NC FL2 (Signed)
[place] LEVEL OF CARE SCREENING TOOL     IDENTIFICATION  Patient Name: [name] Birthdate: Dec 22, 1924 Sex: male Admission Date (Current Location): 01/14/2016  [facility] and [place] Number:  Herbalist and Address:  [facility],  [place] [address], [place]      Provider Number: [id number]  Attending Physician Name and Address:  Albertine Patricia, MD  Relative Name and Phone Number:       Current Level of Care: Hospital Recommended Level of Care: [place] Prior Approval Number:    Date Approved/Denied:   PASRR Number:   [id number]   Discharge Plan: SNF    Current Diagnoses: Patient Active Problem List   Diagnosis Date Noted  . Encounter for palliative care   . Sepsis ([place]) 01/17/2016  . Acute kidney injury ([place])   . Colonic mass   . MRSA bacteremia   . Weakness of both lower extremities   . Bilateral foot-drop   . Lewy body dementia without behavioral disturbance   . Goals of care, counseling/discussion   . Dehydration 01/14/2016  . Acute renal failure (ARF) ([place]) 01/14/2016  . Hematuria 01/14/2016  . Chronic atrial fibrillation ([place]) 01/14/2016  . Metastasis to liver ([place]) 01/14/2016  . Pressure ulcer 10/27/2015  . GI bleed 10/22/2015  . Upper GI bleed 10/22/2015  . H/O falling 07/06/2014  . Long term (current) use of anticoagulants 02/14/2013  . Foot ulcer ([place]) 02/10/2013  . CAD (coronary artery disease) 01/16/2013  . Chronic systolic CHF (congestive heart failure) ([place]) 01/16/2013  . Atrial fibrillation with RVR ([place]) 12/27/2012  . Acute on chronic systolic heart failure ([place]) 12/27/2012  . AKI (acute kidney injury) ([place]) 12/13/2012  . UTI (lower urinary tract infection) 01/20/2012  . CONSTIPATION 10/12/2010  . DEPRESSION, RECURRENT 07/04/2010  . INTENTION TREMOR 07/04/2010  . INSOMNIA 06/27/2010  . NOCTURIA 06/27/2010  . ABNORMAL ELECTROCARDIOGRAM 04/19/2010  . MENTAL CONFUSION  03/08/2010  . Dizziness and giddiness 03/08/2010  . LACK OF COORDINATION 03/08/2010  . FATIGUE 02/03/2009  . FASTING HYPERGLYCEMIA 02/03/2009  . SKIN CANCER, HX OF 02/03/2009  . HYPERLIPIDEMIA 10/19/2006  . ANEMIA-NOS 10/19/2006  . Essential hypertension 10/19/2006  . COLONIC POLYPS, HX OF 10/19/2006    Orientation RESPIRATION BLADDER Height & Weight     Self, Situation, Place  Normal Incontinent Weight: 171 lb 15.3 oz (78 kg) Height:  6' (182.9 cm)  BEHAVIORAL SYMPTOMS/MOOD NEUROLOGICAL BOWEL NUTRITION STATUS      Incontinent Diet (regular (advancing))  AMBULATORY STATUS COMMUNICATION OF NEEDS Skin   Extensive Assist Verbally PU Stage and Appropriate Care PU Stage 1 Dressing: No Dressing (Stage I -  Intact skin with non-blanchable redness of a localized area usually over a bony prominence.) PU Stage 2 Dressing: Daily (Stage II -  Partial thickness loss of dermis presenting as a shallow open ulcer with a red, pink wound bed without slough.)                   Personal Care Assistance Level of Assistance  Bathing, Feeding, Dressing Bathing Assistance: Limited assistance Feeding assistance: Limited assistance Dressing Assistance: Limited assistance     Functional Limitations Info  Sight, Hearing, Speech Sight Info: Adequate Hearing Info: Impaired Speech Info: Adequate    SPECIAL CARE FACTORS FREQUENCY  PT (By licensed PT), OT (By licensed OT)                     Contractures Contractures Info:  Not present    Additional Factors Info  Code Status, Allergies, Psychotropic, Isolation Precautions Code Status Info: DNR Allergies Info: Darifenacin Hydrobromide, Fluoxetine Hcl Psychotropic Info: Wellbutrin   Isolation Precautions Info: Yes on contact, MRSA     Current Medications (01/21/2016):  This is the current hospital active medication list Current Facility-Administered Medications  Medication Dose Route Frequency Provider Last Rate Last Dose  . 0.9 %  sodium  chloride infusion   Intravenous Once Kelvin Cellar, MD      . 0.9 %  sodium chloride infusion   Intravenous Continuous Laurence Spates, MD 20 mL/hr at 01/19/16 1237 500 mL at 01/19/16 1237  . 0.9 %  sodium chloride infusion   Intravenous Continuous Teena Irani, MD      . acetaminophen (TYLENOL) tablet 650 mg  650 mg Oral Q6H PRN Toy Baker, MD       Or  . acetaminophen (TYLENOL) suppository 650 mg  650 mg Rectal Q6H PRN Toy Baker, MD      . amiodarone (PACERONE) tablet 100 mg  100 mg Oral Daily Toy Baker, MD   100 mg at 01/21/16 1100  . buPROPion [facility]) tablet 75 mg  75 mg Oral BID Toy Baker, MD   75 mg at 01/21/16 1100  . heparin ADULT infusion 100 units/mL (25000 units/234mL sodium chloride 0.45%)  1,150 Units/hr Intravenous Continuous Randa Spike, RPH 11.5 mL/hr at 01/20/16 1849 1,150 Units/hr at 01/20/16 1849  . HYDROcodone-acetaminophen (NORCO/VICODIN) 5-325 MG per tablet 1-2 tablet  1-2 tablet Oral Q4H PRN Toy Baker, MD   1 tablet at 01/14/16 2313  . mirabegron ER (MYRBETRIQ) tablet 25 mg  25 mg Oral Daily Toy Baker, MD   25 mg at 01/21/16 1100  . ondansetron (ZOFRAN) tablet 4 mg  4 mg Oral Q6H PRN Toy Baker, MD       Or  . ondansetron (ZOFRAN) injection 4 mg  4 mg Intravenous Q6H PRN Toy Baker, MD      . pantoprazole (PROTONIX) EC tablet 40 mg  40 mg Oral BID AC Toy Baker, MD   40 mg at 01/21/16 0834  . polyethylene glycol-electrolytes (NuLYTELY/GoLYTELY) solution 4,000 mL  4,000 mL Oral Once Ronald Lobo, MD   4,000 mL at 01/17/16 0930  . pravastatin (PRAVACHOL) tablet 80 mg  80 mg Oral Daily Toy Baker, MD   80 mg at 01/21/16 1100  . sodium chloride flush (NS) 0.9 % injection 3 mL  3 mL Intravenous Q12H Toy Baker, MD   3 mL at 01/20/16 2200  . traZODone (DESYREL) tablet 50 mg  50 mg Oral QHS Toy Baker, MD   50 mg at 01/20/16 2045  . vancomycin (VANCOCIN) 500 mg in sodium  chloride 0.9 % 100 mL IVPB  500 mg Intravenous Q12H Silver Huguenin Elgergawy, MD   500 mg at 01/21/16 1100  . zolpidem (AMBIEN) tablet 2.5 mg  2.5 mg Oral QHS PRN Toy Baker, MD         Discharge Medications: Please see discharge summary for a list of discharge medications.  Relevant Imaging Results:  Relevant Lab Results:   Additional Information on IV Antibiotics has PICC Line placed  (need for next 3 weeks thru 02/05/16)    SSN:[id number]  Palliative Care at Facility for continued care.     Lilly C[place], LCSW

## 2016-01-21 NOTE — Progress Notes (Signed)
Brief Pharmacy Note Re: Vancomycin  Briefly, 82 yo M on IV Vancomycin for MRSA bacteremia.  Planning at least 4 weeks of therapy.  See more detailed pharmacy note by C. Summe, PharmD from earlier today.    O:  Vancomycin trough= 54mcg/ml on 500mg  IV q12h (goal 15-18mcg/ml)      Renal function has been stable  A/P:  RN hung Vancomycin dose at 2103- she was unaware VT due & uncertain if this was hung before or after VT.  Based on collect time for Vanc trough will assume this was hung prior to level being drawn.   1) Cont Vanc 500mg  IV q12h for now 2) Re-check Vanc trough with am dose  Netta Cedars, PharmD, BCPS Pager: [phone] 01/21/2016@10 :18 PM

## 2016-01-21 NOTE — Clinical Social Work Note (Signed)
Clinical Social Work Assessment  Patient Details  Name: [name] MRN: [id number] Date of Birth: 09/29/1924  Date of referral:  01/21/16               Reason for consult:  Facility Placement                Permission sought to share information with:  Case Manager, Customer service manager, Family Supports Permission granted to share information::  Yes, Verbal Permission Granted  Name::        Agency::     Relationship::  Daughter: [name] manager Information:     Housing/Transportation Living arrangements for the past 2 months:  Single Family Home Source of Information:  Medical Team, Adult Children, Case Manager Patient Interpreter Needed:  None Criminal Activity/Legal Involvement Pertinent to Current Situation/Hospitalization:  No - Comment as needed Significant Relationships:  Adult Children, Warehouse manager, Other Family Members Lives with:  Self Do you feel safe going back to the place where you live?  Yes Need for family participation in patient care:  Yes (Comment)  Care giving concerns:  LCSW met with patient daughter and case manager at length to discuss options for patient at discharge. Prior to admission, patient living home alone with private caregivers on a daily basis. Daughter reports she recently lost her job after her mother passed away in 2022-12-21 of this year and she has moved back home. She pays private sitters to stay with patient at home. She reports baseline for him is not walking and he was able to bear some weight to pivot to the bathroom or down a small hall. Her concerns remain to be the ability of her caregivers to give IV antibiotics and his stool issues as he is on a clear diet.  She has [facility] in the home currently.   Social Worker assessment / plan:  As stated above, LCSW met with daughter and CM to discuss plan. Educated daughter on role and reason for consult. Was able to explain SW process and involvement with disposition.   Disucssed need for back up plan in case home was not an option, but also benefits of SNF vs benefits of home with home health or hospice.  Daughter agreeable to all information. She is working to reach her caregivers and assess if they can manage him at home. Will have weekend SW follow up with dc plan regarding SNF vs Home. FL2 updated Full SNF work up completed. Awaiting bed offers and will have weekend SW follow up with disposition.  Employment status:  Retired Nurse, adult PT Recommendations:  Not assessed at this time Information / Referral to community resources:  [place]  Patient/Family's Response to care:  Open to home or SNF  Patient/Family's Understanding of and Emotional Response to Diagnosis, Current Treatment, and Prognosis:  Daughter voices frustration in process and feeling overwhelmed with care giving options. She takes notes diligently throughout conversation and agreeable to education given. She is being proactive in patient care and putting his wishes first when helping make decisions.   Emotional Assessment Appearance:  Appears stated age Attitude/Demeanor/Rapport:  Other (cooperative, calm) Affect (typically observed):  Accepting, Adaptable Orientation:    Alcohol / Substance use:  Not Applicable Psych involvement (Current and /or in the community):  No (Comment)  Discharge Needs  Concerns to be addressed:  No discharge needs identified Readmission within the last 30 days:  No Current discharge risk:  None Barriers to Discharge:  Ship broker, Continued Medical Work up   Marshell Garfinkel 01/21/2016, 3:26 PM

## 2016-01-21 NOTE — Progress Notes (Signed)
Daily Progress Note   Patient Name: [name]       Date: 01/21/2016 DOB: 1925-02-05  Age: 82 y.o. MRN#: [id number] Attending Physician: Albertine Patricia, MD Primary Care Physician: No primary care provider on file. Admit Date: 01/14/2016  Reason for Consultation/Follow-up: Establishing goals of care  Subjective:  Patient resting comfortably in bed. Hard of hearing but was able to fully participate in discussions.  See below.  Length of Stay: 7  Current Medications: Scheduled Meds:  . sodium chloride   Intravenous Once  . amiodarone  100 mg Oral Daily  . buPROPion  75 mg Oral BID  . mirabegron ER  25 mg Oral Daily  . pantoprazole  40 mg Oral BID AC  . polyethylene glycol-electrolytes  4,000 mL Oral Once  . pravastatin  80 mg Oral Daily  . sodium chloride flush  3 mL Intravenous Q12H  . traZODone  50 mg Oral QHS  . vancomycin  500 mg Intravenous Q12H    Continuous Infusions: . sodium chloride 500 mL (01/19/16 1237)  . sodium chloride    . heparin 1,150 Units/hr (01/20/16 1849)    PRN Meds: acetaminophen **OR** acetaminophen, HYDROcodone-acetaminophen, ondansetron **OR** ondansetron (ZOFRAN) IV, zolpidem  Physical Exam         Elderly gentleman resting in bed Hard of hearing Confused Pattern of breathing appears to be comfortable, does not appear to be in any acute respiratory distress Trace edema Abdomen appears nondistended  Vital Signs: BP 150/65 mmHg  Pulse 59  Temp(Src) 98.3 F (36.8 C) (Oral)  Resp 20  Ht 6' (1.829 m)  Wt 78 kg (171 lb 15.3 oz)  BMI 23.32 kg/m2  SpO2 100% SpO2: SpO2: 100 % O2 Device: O2 Device: Not Delivered O2 Flow Rate: O2 Flow Rate (L/min): 3 L/min  Intake/output summary:   Intake/Output Summary (Last 24 hours) at 01/21/16  1208 Last data filed at 01/21/16 0658  Gross per 24 hour  Intake 1044.71 ml  Output   1650 ml  Net -605.29 ml   LBM: Last BM Date: 01/19/16 Baseline Weight: Weight: 79.379 kg (175 lb) Most recent weight: Weight: 78 kg (171 lb 15.3 oz)       Palliative Assessment/Data:    Flowsheet Rows        Most Recent Value   Intake Tab  Referral Department  Hospitalist   Unit at Time of Referral  Oncology Unit   Palliative Care Primary Diagnosis  Cancer   Date Notified  01/18/16   Palliative Care Type  Return patient Palliative Care   Reason for referral  Clarify Goals of Care   Date of Admission  01/14/16   Date first seen by Palliative Care  01/18/16   # of days Palliative referral response time  0 Day(s)   # of days IP prior to Palliative referral  4   Clinical Assessment    Palliative Performance Scale Score  30%   Pain Max last 24 hours  5   Pain Min Last 24 hours  4   Dyspnea Max Last 24 Hours  4   Dyspnea Min Last 24 hours  3   Psychosocial & Spiritual Assessment    Palliative Care Outcomes    Patient/Family meeting held?  Yes   Who was at the meeting?  daughter [name] over the phone.    Palliative Care follow-up planned  Yes, Home      Patient Active Problem List   Diagnosis Date Noted  . Encounter for palliative care   . Sepsis ([place]) 01/17/2016  . Acute kidney injury ([place])   . Colonic mass   . MRSA bacteremia   . Weakness of both lower extremities   . Bilateral foot-drop   . Lewy body dementia without behavioral disturbance   . Goals of care, counseling/discussion   . Dehydration 01/14/2016  . Acute renal failure (ARF) ([place]) 01/14/2016  . Hematuria 01/14/2016  . Chronic atrial fibrillation ([place]) 01/14/2016  . Metastasis to liver ([place]) 01/14/2016  . Pressure ulcer 10/27/2015  . GI bleed 10/22/2015  . Upper GI bleed 10/22/2015  . H/O falling 07/06/2014  . Long term (current) use of anticoagulants 02/14/2013  . Foot ulcer ([place]) 02/10/2013  . CAD (coronary  artery disease) 01/16/2013  . Chronic systolic CHF (congestive heart failure) ([place]) 01/16/2013  . Atrial fibrillation with RVR ([place]) 12/27/2012  . Acute on chronic systolic heart failure ([place]) 12/27/2012  . AKI (acute kidney injury) ([place]) 12/13/2012  . UTI (lower urinary tract infection) 01/20/2012  . CONSTIPATION 10/12/2010  . DEPRESSION, RECURRENT 07/04/2010  . INTENTION TREMOR 07/04/2010  . INSOMNIA 06/27/2010  . NOCTURIA 06/27/2010  . ABNORMAL ELECTROCARDIOGRAM 04/19/2010  . MENTAL CONFUSION 03/08/2010  . Dizziness and giddiness 03/08/2010  . LACK OF COORDINATION 03/08/2010  . FATIGUE 02/03/2009  . FASTING HYPERGLYCEMIA 02/03/2009  . SKIN CANCER, HX OF 02/03/2009  . HYPERLIPIDEMIA 10/19/2006  . ANEMIA-NOS 10/19/2006  . Essential hypertension 10/19/2006  . COLONIC POLYPS, HX OF 10/19/2006    Palliative Care Assessment & Plan   Patient Profile:  82 year old gentleman with a past medical history significant for hypertension, coronary artery disease, chronic systolic congestive heart failure, atrial fibrillation-patient was on chronic oral anticoagulation with Coumadin, history of GI bleed, chronic indwelling Foley catheter. Patient has been admitted to the hospitalist service since 5-30 initially for hematuria, acute renal failure. Abdominal imaging revealed sigmoid mass possibly metastatic malignancy with evidence of liver mass. Patient seen and evaluated by gastroenterology. On 5-31, underwent sigmoidoscopy showing TWO thirds obstructing large mass in the proximal sigmoid colon, biopsies pending. Palliative care consulted for  CODE STATUS, goals of care discussions, appropriate disposition planning.  6-1: Call placed and discussed with patient's daughter [name] at [phone]. Brief life review performed. Daughter [name] lives in [place] but has been living with the patient in his home since April 2017 after  the patient's wife passed away in 23-Nov-2022 this year. Patient's baseline  functional status prior to this hospitalization was such that with the assistance of 24-7 caregivers, he was able to be out of bed and was able to walk short distances with assistance. He was able to eat by himself. Daughter reports that he was an avid reader but had not been able to read much lately. She had recently arranged for an optometrist who does home visits and he was diagnosed with cataracts. She was in the process of finding an ophthalmologist and having the patient undergo cataract surgery prior to this acute current hospitalization.  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  History of systolic congestive heart failure, atrial fibrillation  Bacteremia in this hospitalization, being followed by infectious disease, antibiotics per infectious disease.  Recommendations/Plan:    Family meeting with the patient, patient's daughter [name], patient's friend who is an Therapist, sports, family caregiver all present at the bedside. Brief description of his serious medical conditions discussed. 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 He has bacteremia and is being followed by infectious disease, underwent PICC line placement. Discussed about appropriate disposition options. Discussed about patient's disease trajectory.  Plan: 1. Physical therapy consultation 2. Patient and family would like to consider skilled nursing facility to attend rehabilitation and to continue to receive IV antibiotics, treat the treatable immediately post discharge from hospitalization this time. 3. Continue to monitor disease trajectory over the weekend. Patient wants to try some ensure.  Code Status:    Code Status Orders        Start     Ordered   01/20/16 0855  Do not  attempt resuscitation (DNR)   Continuous    Question Answer Comment  In the event of cardiac or respiratory ARREST Do not call a "code blue"   In the event of cardiac or respiratory ARREST Do not perform Intubation, CPR, defibrillation or ACLS   In the event of cardiac or respiratory ARREST Use medication by any route, position, wound care, and other measures to relive pain and suffering. May use oxygen, suction and manual treatment of airway obstruction as needed for comfort.      01/20/16 0854    Code Status History    Date Active Date Inactive Code Status Order ID Comments User Context   01/14/2016 10:10 PM 01/20/2016  8:54 AM Full Code [id number]  Toy Baker, MD Inpatient   10/23/2015 12:52 AM 10/28/2015  3:48 PM Full Code [id number]  Elmarie Shiley, MD ED    Advance Directive Documentation        Most Recent Value   Type of Advance Directive  Healthcare Power of Attorney, Living will   Pre-existing out of facility DNR order (yellow form or pink MOST form)     "MOST" Form in Place?         Prognosis:   < 6 months  Discharge Planning:  To Be Determined: SNF rehab with palliative and then consider home with hospice based on trajectory. Daughter still deciding what's best for her father.   Care plan was discussed with  Daughter [name] and patient, also discussed with SW Claiborne Billings.   Thank you for allowing the Palliative Medicine Team to assist in the care of this patient.   Time In:  11 Time Out: 1135 Total Time  35 Prolonged Time Billed  no       Greater than 50%  of this time was spent counseling and coordinating care related to the above assessment and plan.  Loistine Chance, MD [phone]  Please contact Palliative Medicine Team phone at [phone] for questions and concerns.

## 2016-01-21 NOTE — Plan of Care (Signed)
Problem: Education: Goal: Knowledge of [facility] Education information/materials will improve Outcome: Completed/Met Date Met:  01/21/16 Education provided regarding current diagnosis and continued plan of care.

## 2016-01-21 NOTE — Progress Notes (Signed)
ANTICOAGULATION CONSULT NOTE - F/u Consult  Pharmacy Consult for Heparin Indication: atrial fibrillation  Allergies  Allergen Reactions  . Darifenacin Hydrobromide     REACTION: constipation  . Fluoxetine Hcl     REACTION: Very Depressed while taking    Patient Measurements: Height: 6' (182.9 cm) Weight: 171 lb 15.3 oz (78 kg) IBW/kg (Calculated) : 77.6 Heparin Dosing Weight: 82.9 kg  Vital Signs: Temp: 98.3 F (36.8 C) (06/02 0652) Temp Source: Oral (06/02 0652) BP: 150/65 mmHg (06/02 0652) Pulse Rate: 59 (06/02 0652)  Labs:  Recent Labs  01/19/16 0452 01/20/16 0622 01/20/16 0623 01/20/16 1249 01/21/16 0433  HGB 9.7* 9.9*  --   --  9.6*  HCT 29.5* 30.9*  --   --  30.2*  PLT 161 179  --   --  178  LABPROT  --  18.3*  --   --   --   INR  --  1.58*  --   --   --   HEPARINUNFRC  --   --  0.35 0.37 0.38  CREATININE 0.82 0.83  --   --   --     Estimated Creatinine Clearance: 66.2 mL/min (by C-G formula based on Cr of 0.83).  Infusions:  . sodium chloride 500 mL (01/19/16 1237)  . sodium chloride    . heparin 1,150 Units/hr (01/20/16 1849)    Assessment: 22 yoM admitted on 5/26 with hematuria.  PMH includes chair/bed bound due to lower extremity weakness with foot drop, BPH and obstructive uropathy with chronic foley, on chronic warfarin for hx Afib, and FTT. He was found to have large colon mass and liver lesions, and MRSA bacteremia and UTI.  Warfarin was held on admission for hematuria and planned biopsy completed on 5/31.  Post biopsy, MD requests pharmacy dose Heparin drip while warfarin remains on hold for further possible procedures.  Significant Events: 5/25 PTA Warfarin 2.5 mg daily.  Last dose on 5/25 5/29 Transfused 2 unit PRBC  Today, 01/21/2016  Heparin level remains therapeutic on heparin 1150 units/hr  CBC: Hgb low/stable, pltc WNL  No reported bleeding - hematuria resolved  SCDs on  Goal of Therapy:  Heparin level 0.3-0.7  units/ml Monitor platelets by anticoagulation protocol: Yes   Plan:   Continue heparin drip @1150  units/hr  Daily heparin level and CBC.  Follow up plan for resuming oral anticoagulation (following plan of care meeting with palliative care 6/2)  Ralene Bathe, PharmD, BCPS 01/21/2016, 8:03 AM  Pager: [phone]

## 2016-01-21 NOTE — Progress Notes (Signed)
GI: Inconclusive pathology noted. Patient has a space-occupying descending or sigmoid colon mass by both CT and colonoscopy. This is clearly malignant by imaging. Await family consultation with palliative care to determine goals of therapy. A colonic stent could possibly be attempted for palliation, however due to relatively proximal location, multiple diverticuli and difficulty in traversing the lesion, would likely be technically difficult. Please call if we can be of further assistance.

## 2016-01-21 NOTE — Progress Notes (Signed)
PROGRESS NOTE    [name]  [id number] DOB: April 27, 1925 DOA: 01/14/2016 PCP: No primary care provider on file.  Brief Narrative: [name] is an 82 year old with history of dyslipidemia, hypertension, atrial fibrillation, coronary artery disease, medical hematuria hematuria. He is on  warfarin for atrial fibrillation. Workup  CT scan of abdomen and pelvis significant for sigmoid mass and liver metastasis. [facility] for flexible sigmoidoscopy  on Wednesday, 01/19/2016, difficult for partially obstructed sigmoid mass, biopsy showing at least high-grade glandular dysplasia, thought to be by GI superficial/sampling error, finding most likely related to metastatic colon cancer, in by palliative medicine, no plan for him with therapy or further workup, hospice when appropriate. As well workup significant for MRSA bacteremia secondary to UTI, seen by ID, will need total of 3 weeks treatment tell 02/05/2016 on IV vancomycin, PICC line to be inserted.  Assessment & Plan:   Principal Problem:   MRSA bacteremia Active Problems:   UTI (lower urinary tract infection)   Essential hypertension   CAD (coronary artery disease)   Chronic systolic CHF (congestive heart failure) (HCC)   Long term (current) use of anticoagulants   Pressure ulcer   Dehydration   Acute renal failure (ARF) (HCC)   Hematuria   Chronic atrial fibrillation (HCC)   Metastasis to liver (HCC)   Sepsis ([place])   Acute kidney injury ([place])   Colonic mass   Weakness of both lower extremities   Bilateral foot-drop   Lewy body dementia without behavioral disturbance   Goals of care, counseling/discussion   Encounter for palliative care  Suspected metastatic colon cancer. - last colonoscopy in 2007 at which time had removal of malignant polyp. Surveillance exam later that year unremarkable. - CT scan of abdomen and pelvis performed in the emergency room showed a 4.3 x 2.7 cm mass involving sigmoid colon along with liver lesions  that could represent metastatic disease. - Status post sigmoidoscopy 5/31, with finding of partially obstructing sigmoid mass. - Pathology results came back as at least high-grade glandular dysplasia, ideas were discussed with Dr. Amedeo Plenty, he reports a superficial sample, most likely sample error, and he thinks the partially obstructing mass is related to malignancy.  MRSA bacteremia - Cultures on 5/26 is growing MRSA - urine is growing Staphylococcus aureus and I am suspecting this to be his source of infection. -Transthoracic echocardiogram performed on 01/17/2016 showing ejection fraction of [id number], grade 2 diastolic dysfunction, without evidence of vegetations. -Continue IV vancomycin. Had been on IV ceftriaxone which was discontinued. -Repeat blood cultures on 01/16/2016 : no growth 2 sets. -We will plan to continue with IV vancomycin until 02/05/2016   Hematuria. -Initially presented with complaints of hematuria. He is anticoagulated with warfarin presented with INR of 2.76 and PTT of 28.8. -CT showed left renal pelvic stone without obstructive changes. -Urine analysis consistent with urinary tract infection. -Initially he was treated with ceftriaxone however urine cultures growing methicillin-resistant Staphylococcus aureus, antibiotic regimen changed to IV vancomycin. Urine clearing up.  History of atrial fibrillation -CHADSVASC score 5 -Continue amiodarone 100 mg by mouth daily - Has been anticoagulated with warfarin, discussed with daughter, giving for poor prognosis prognosis, diagnosis of stage IV cancer no further workup and this point, will discontinue anticoagulation to minimize discomfort for patient and labs checked.   Dyslipidemia. -Continue pravastatin 80 mg by mouth daily  Acute kidney injury. -Resolved with IV fluid resuscitation  Acute blood loss anemia. -Likely secondary to hematuria and possibly colon cancer. Lab work as revealed a  downward trend in his hemoglobin  to 8.0 from 9.62 days ago. -He was transfuse 1 unit of packed red blood cells on 01/16/2016  Urinary tract infection. -Urinalysis revealed the presence of many bacteria with leukocytes. -Urine cultures pending -Urine cultures now growing Staphylococcus aureus as blood cultures reported by microbiology to be growing methicillin-resistant Staphylococcus aureus -Antibiotic coverage changed to vancomycin  Sepsis -Present on admission, evidenced by a white count of 29,800, blood cultures now returning positive for methicillin-resistant Staphylococcus aureus, acute kidney injury. -The source of infection likely urinary tract given urine cultures growing Staphylococcus aureus as well. -White count now trending down, he was started on IV vancomycin. Repeat blood cultures negative.  Goals of care: Recent would like to continue with IV antibiotics, treat his bacteremia, but they do not wish for any further workup or treatment for his cancer.  DVT prophylaxis: We'll discontinue heparin GTT, will start on subcutaneous heparin Code Status: DNR Family Communication: Daughter at bedside Disposition Plan: Need SNF placement, will observe over the weekend, will need PICC line, hopefully SNF discharge in early next week.   Consultants:   GI  ID  Palliative medicine   Procedures:   Antimicrobials:   Ceftriaxone 1 g IV every 24 hours discontinued on 01/17/2016  Vancomycin started on 01/17/2016  Subjective: [name] is awake and alert, no acute distress, denies any complaints.  Objective: Filed Vitals:   01/20/16 0650 01/20/16 1429 01/20/16 2222 01/21/16 0652  BP: 157/71 117/63 139/66 150/65  Pulse: 54 55 60 59  Temp: 98 F (36.7 C) 98.2 F (36.8 C) 98.3 F (36.8 C) 98.3 F (36.8 C)  TempSrc: Oral Oral Oral Oral  Resp: 20 22 20 20   Height:      Weight: 81 kg (178 lb 9.2 oz)   78 kg (171 lb 15.3 oz)  SpO2: 97% 96% 97% 100%    Intake/Output Summary (Last 24 hours) at 01/21/16  1429 Last data filed at 01/21/16 0658  Gross per 24 hour  Intake 564.71 ml  Output   1650 ml  Net -1085.29 ml   Filed Weights   01/19/16 0648 01/20/16 0650 01/21/16 0652  Weight: 82.9 kg (182 lb 12.2 oz) 81 kg (178 lb 9.2 oz) 78 kg (171 lb 15.3 oz)    Examination:  General exam: Elderly gentleman, no acute distress Respiratory system: Clear to auscultation. Respiratory effort normal. Cardiovascular system: S1 & S2 heard, RRR. No JVD, murmurs, rubs, gallops or clicks. No pedal edema. Gastrointestinal system: Abdomen is nondistended, soft and nontender. No organomegaly or masses felt. Normal bowel sounds heard. Central nervous system: Alert and oriented. No focal neurological deficits. Extremities: Symmetric 5 x 5 power. Skin: No rashes, lesions or ulcers     Data Reviewed: I have personally reviewed following labs and imaging studies  CBC:  Recent Labs Lab 01/14/16 2247  01/17/16 0407 01/18/16 0431 01/19/16 0452 01/20/16 0622 01/21/16 0433  WBC 19.7*  < > 7.7 6.3 7.7 8.8 7.9  NEUTROABS 17.7*  --   --   --   --   --   --   HGB 9.6*  < > 9.5* 9.8* 9.7* 9.9* 9.6*  HCT 29.7*  < > 29.1* 29.5* 29.5* 30.9* 30.2*  MCV 89.5  < > 90.4 89.9 91.0 91.2 89.6  PLT 240  < > 172 158 161 179 178  < > = values in this interval not displayed. Basic Metabolic Panel:  Recent Labs Lab 01/15/16 0520 01/16/16 0518 01/17/16 0407 01/19/16 0452 01/20/16  0622  NA 138 138 138 141 138  K 4.3 4.1 4.0 3.8 4.0  CL 105 110 107 109 107  CO2 25 23 25 26 25   GLUCOSE 102* 93 94 93 90  BUN 40* 22* 14 6 6   CREATININE 1.64* 1.07 0.83 0.82 0.83  CALCIUM 8.7* 8.2* 8.1* 8.4* 8.4*  MG 2.1  --   --   --   --   PHOS 4.0  --   --   --   --    GFR: Estimated Creatinine Clearance: 66.2 mL/min (by C-G formula based on Cr of 0.83). Liver Function Tests:  Recent Labs Lab 01/14/16 1600 01/15/16 0520  AST 33 28  ALT 37 30  ALKPHOS 141* 119  BILITOT 0.7 0.6  PROT 7.0 6.2*  ALBUMIN 3.1* 2.8*     Recent Labs Lab 01/14/16 1600  LIPASE 20   No results for input(s): AMMONIA in the last 168 hours. Coagulation Profile:  Recent Labs Lab 01/15/16 0520 01/16/16 0518 01/17/16 0407 01/18/16 0431 01/20/16 0622  INR 2.76* 2.40* 2.07* 1.88* 1.58*   Cardiac Enzymes: No results for input(s): CKTOTAL, CKMB, CKMBINDEX, TROPONINI in the last 168 hours. BNP (last 3 results) No results for input(s): PROBNP in the last 8760 hours. HbA1C: No results for input(s): HGBA1C in the last 72 hours. CBG: No results for input(s): GLUCAP in the last 168 hours. Lipid Profile: No results for input(s): CHOL, HDL, LDLCALC, TRIG, CHOLHDL, LDLDIRECT in the last 72 hours. Thyroid Function Tests: No results for input(s): TSH, T4TOTAL, FREET4, T3FREE, THYROIDAB in the last 72 hours. Anemia Panel: No results for input(s): VITAMINB12, FOLATE, FERRITIN, TIBC, IRON, RETICCTPCT in the last 72 hours. Sepsis Labs:  Recent Labs Lab 01/14/16 1737  LATICACIDVEN 2.24*    Recent Results (from the past 240 hour(s))  Urine culture     Status: Abnormal   Collection Time: 01/14/16  4:05 PM  Result Value Ref Range Status   Specimen Description URINE, CLEAN CATCH  Final   Special Requests NONE  Final   Culture (A)  Final    >=100,000 COLONIES/mL METHICILLIN RESISTANT STAPHYLOCOCCUS AUREUS   Report Status 01/18/2016 FINAL  Final   Organism ID, Bacteria METHICILLIN RESISTANT STAPHYLOCOCCUS AUREUS (A)  Final      Susceptibility   Methicillin resistant staphylococcus aureus - MIC*    CIPROFLOXACIN Value in next row Resistant      RESISTANT>=4    GENTAMICIN Value in next row Sensitive      SENSITIVE<=0.5    NITROFURANTOIN Value in next row Sensitive      SENSITIVE<=16    OXACILLIN Value in next row Resistant      RESISTANT>=4    TETRACYCLINE Value in next row Sensitive      SENSITIVE<=1    VANCOMYCIN Value in next row Sensitive      SENSITIVE<=0.5    TRIMETH/SULFA Value in next row Sensitive       SENSITIVE<=10    CLINDAMYCIN Value in next row Sensitive      SENSITIVE<=0.25    RIFAMPIN Value in next row Sensitive      SENSITIVE<=0.5    * >=100,000 COLONIES/mL METHICILLIN RESISTANT STAPHYLOCOCCUS AUREUS  Culture, blood (Routine X 2) w Reflex to ID Panel     Status: Abnormal   Collection Time: 01/14/16  5:25 PM  Result Value Ref Range Status   Specimen Description BLOOD LEFT ANTECUBITAL  Final   Special Requests BOTTLES DRAWN AEROBIC AND ANAEROBIC 5CC  Final   Culture  Setup Time   Final    GRAM POSITIVE COCCI IN CLUSTERS IN BOTH AEROBIC AND ANAEROBIC BOTTLES CRITICAL RESULT CALLED TO, READ BACK BY AND VERIFIED WITH: RN J MCNULTY AT 2103 [id number] MARTINB    Culture (A)  Final    STAPHYLOCOCCUS AUREUS SUSCEPTIBILITIES PERFORMED ON PREVIOUS CULTURE WITHIN THE LAST 5 DAYS. Performed at [facility]    Report Status 01/18/2016 FINAL  Final  Culture, blood (Routine X 2) w Reflex to ID Panel     Status: Abnormal   Collection Time: 01/14/16  9:09 PM  Result Value Ref Range Status   Specimen Description BLOOD RIGHT ARM  Final   Special Requests BOTTLES DRAWN AEROBIC AND ANAEROBIC 5CC  Final   Culture  Setup Time   Final    GRAM POSITIVE COCCI IN CLUSTERS IN BOTH AEROBIC AND ANAEROBIC BOTTLES CRITICAL RESULT CALLED TO, READ BACK BY AND VERIFIED WITH: TERRI GREEN PHARMD 01/16/16 AT 1924 BY [place] Performed at [address] (A)  Final   Report Status 01/18/2016 FINAL  Final   Organism ID, Bacteria METHICILLIN RESISTANT STAPHYLOCOCCUS AUREUS  Final      Susceptibility   Methicillin resistant staphylococcus aureus - MIC*    CIPROFLOXACIN >=8 RESISTANT Resistant     ERYTHROMYCIN >=8 RESISTANT Resistant     GENTAMICIN <=0.5 SENSITIVE Sensitive     OXACILLIN >=4 RESISTANT Resistant     TETRACYCLINE <=1 SENSITIVE Sensitive     VANCOMYCIN <=0.5 SENSITIVE Sensitive     TRIMETH/SULFA <=10 SENSITIVE Sensitive      CLINDAMYCIN <=0.25 SENSITIVE Sensitive     RIFAMPIN <=0.5 SENSITIVE Sensitive     Inducible Clindamycin NEGATIVE Sensitive     * METHICILLIN RESISTANT STAPHYLOCOCCUS AUREUS  Blood Culture ID Panel (Reflexed)     Status: Abnormal   Collection Time: 01/14/16  9:09 PM  Result Value Ref Range Status   Enterococcus species NOT DETECTED NOT DETECTED Final   Vancomycin resistance NOT DETECTED NOT DETECTED Final   Listeria monocytogenes NOT DETECTED NOT DETECTED Final   Staphylococcus species NOT DETECTED NOT DETECTED Final   Staphylococcus aureus DETECTED (A) NOT DETECTED Final    Comment: CRITICAL RESULT CALLED TO, READ BACK BY AND VERIFIED WITH: TERRI GREEN PHARMD 01/16/16 AT 1924 BY [place]    Methicillin resistance DETECTED (A) NOT DETECTED Final    Comment: CRITICAL RESULT CALLED TO, READ BACK BY AND VERIFIED WITH: TERRI GREEN PHARMD 01/16/16 AT 1924 BY [place]    Streptococcus species NOT DETECTED NOT DETECTED Final   Streptococcus agalactiae NOT DETECTED NOT DETECTED Final   Streptococcus pneumoniae NOT DETECTED NOT DETECTED Final   Streptococcus pyogenes NOT DETECTED NOT DETECTED Final   Acinetobacter baumannii NOT DETECTED NOT DETECTED Final   Enterobacteriaceae species NOT DETECTED NOT DETECTED Final   Enterobacter cloacae complex NOT DETECTED NOT DETECTED Final   Escherichia coli NOT DETECTED NOT DETECTED Final   Klebsiella oxytoca NOT DETECTED NOT DETECTED Final   Klebsiella pneumoniae NOT DETECTED NOT DETECTED Final   Proteus species NOT DETECTED NOT DETECTED Final   Serratia marcescens NOT DETECTED NOT DETECTED Final   Carbapenem resistance NOT DETECTED NOT DETECTED Final   Haemophilus influenzae NOT DETECTED NOT DETECTED Final   Neisseria meningitidis NOT DETECTED NOT DETECTED Final   Pseudomonas aeruginosa NOT DETECTED NOT DETECTED Final   Candida albicans NOT DETECTED NOT DETECTED Final   Candida glabrata NOT DETECTED NOT DETECTED Final   Candida krusei NOT DETECTED  NOT  DETECTED Final   Candida parapsilosis NOT DETECTED NOT DETECTED Final   Candida tropicalis NOT DETECTED NOT DETECTED Final    Comment: Performed at [facility]  MRSA PCR Screening     Status: Abnormal   Collection Time: 01/14/16 10:42 PM  Result Value Ref Range Status   MRSA by PCR POSITIVE (A) NEGATIVE Final    Comment:        The GeneXpert MRSA Assay (FDA approved for NASAL specimens only), is one component of a comprehensive MRSA colonization surveillance program. It is not intended to diagnose MRSA infection nor to guide or monitor treatment for MRSA infections. RESULT CALLED TO, READ BACK BY AND VERIFIED WITH: A LAMB RN 0138 01/15/16 A NAVARRO   Culture, blood (Routine X 2) w Reflex to ID Panel     Status: None (Preliminary result)   Collection Time: 01/16/16  9:50 PM  Result Value Ref Range Status   Specimen Description BLOOD RIGHT ANTECUBITAL  Final   Special Requests BOTTLES DRAWN AEROBIC AND ANAEROBIC 10CC  Final   Culture   Final    NO GROWTH 3 DAYS Performed at [facility]    Report Status PENDING  Incomplete  Culture, blood (Routine X 2) w Reflex to ID Panel     Status: None (Preliminary result)   Collection Time: 01/16/16  9:51 PM  Result Value Ref Range Status   Specimen Description BLOOD RIGHT ARM  Final   Special Requests BOTTLES DRAWN AEROBIC ONLY 5CC  Final   Culture   Final    NO GROWTH 3 DAYS Performed at [facility]    Report Status PENDING  Incomplete         Radiology Studies: No results found.      Scheduled Meds: . sodium chloride   Intravenous Once  . amiodarone  100 mg Oral Daily  . buPROPion  75 mg Oral BID  . [START ON 01/22/2016] heparin subcutaneous  5,000 Units Subcutaneous Q8H  . mirabegron ER  25 mg Oral Daily  . pantoprazole  40 mg Oral BID AC  . polyethylene glycol-electrolytes  4,000 mL Oral Once  . pravastatin  80 mg Oral Daily  . sodium chloride flush  3 mL Intravenous Q12H  . traZODone   50 mg Oral QHS  . vancomycin  500 mg Intravenous Q12H   Continuous Infusions: . sodium chloride 500 mL (01/19/16 1237)  . sodium chloride       LOS: 7 days    Time spent: 25 min    Jodye Scali, MD [facility] Pager [phone]  If 7PM-7AM, please contact night-coverage [email or web address] Password [facility] 01/21/2016, 2:29 PM

## 2016-01-21 NOTE — Progress Notes (Signed)
Pharmacy Antibiotic Follow-up Note  [name] is a 82 y.o. year-old male admitted on 01/14/2016 with MRSA UTI and bacteremia.  Pharmacy dosing Vancomycin.   6/2: Repeat blood cultures from 5/28 remain NGTD.  Renal function improved since admission. Vancomycin trough 5/31 therapeutic but trending towards upper limit of goal therefore dose empirically reduced due to concern for accumulation with 4 weeks of therapy.  Afebrile and WBC WNL.  Plan for PICC. ID following.   Plan: Continue Vancomycin 500mg  IV q12h.   Check VT tonight prior to 5th dose.  Follow renal function and cultures.  Suggest monitoring SCr q48h due to advanced age    Temp (24hrs), Avg:98.3 F (36.8 C), Min:98.2 F (36.8 C), Max:98.3 F (36.8 C)   Recent Labs Lab 01/17/16 0407 01/18/16 0431 01/19/16 0452 01/20/16 0622 01/21/16 0433  WBC 7.7 6.3 7.7 8.8 7.9     Recent Labs Lab 01/15/16 0520 01/16/16 0518 01/17/16 0407 01/19/16 0452 01/20/16 0622  CREATININE 1.64* 1.07 0.83 0.82 0.83   Estimated Creatinine Clearance: 66.2 mL/min (by C-G formula based on Cr of 0.83).    Allergies  Allergen Reactions  . Darifenacin Hydrobromide     REACTION: constipation  . Fluoxetine Hcl     REACTION: Very Depressed while taking   Antimicrobials this admission: 5/27 CTX>> 5/28 5/28 Vancomycin >>  Levels/dose changes this admission: 5/31 Vanc trough (prior to 5th dose) = 19, empirically reduce to 500mg  q12h for concern for accumulation with 4 weeks therapy 6/2 VT (prior to 5th dose) = _______  Microbiology results: 5/26 bcx x2:  BCID MRSA 5/26 ucx: >100K MRSA 5/26 MRSA PCR (+)--> chlx, bactroban 5/28 BCx2: NGTD  Thank you for allowing pharmacy to be a part of this patient's care.  Ralene Bathe, PharmD, BCPS 01/21/2016, 8:07 AM  Pager: [phone]

## 2016-01-21 NOTE — Progress Notes (Signed)
Long discussion with patient's daughter about discharge plan. She is not sure about home vs SNF, hospice questions as well. CSW and myself spent ~45 min discussing what each option would look like. Daughter agrees to having CSW fax out for SNF, she will discuss with caregivers if they are comfortable with IV abx and care of patient at this point. Patient has hospital bed, bedside commode. Home Hospice list provided for choice. Patient was active with [facility] prior to admission. Patient had 2 caregivers hired by family prior to admission. Daughter will discuss with family and let us know her final decision.

## 2016-01-21 NOTE — Progress Notes (Signed)
Patient ID: [name], male   DOB: [birth year], 82 y.o.   MRN: [id number]         [facility]    Date of Admission:  01/14/2016   Day 6 vancomycin  He is feeling better today. He is agreeable with PICC placement to facilitate completion of his IV vancomycin therapy for MRSA bacteremia and UTI. I plan to give him a total of 3 weeks of vancomycin through 02/05/2016. Please call Dr. Tommy Medal [phone]) for any infectious disease questions this weekend.         Michel Bickers, MD [facility] [phone] pager   [phone] cell 08/24/2015, 1:32 PM

## 2016-01-21 NOTE — Clinical Social Work Placement (Signed)
   CLINICAL SOCIAL WORK PLACEMENT  NOTE  Date:  01/21/2016  Patient Details  Name: [name] MRN: [id number] Date of Birth: 09/12/1924  Clinical Social Work is seeking post-discharge placement for this patient at the [place] level of care (*CSW will initial, date and re-position this form in  chart as items are completed):  Yes   Patient/family provided with [facility] Work Department's list of facilities offering this level of care within the geographic area requested by the patient (or if unable, by the patient's family).  Yes   Patient/family informed of their freedom to choose among providers that offer the needed level of care, that participate in Medicare, Medicaid or managed care program needed by the patient, have an available bed and are willing to accept the patient.  Yes   Patient/family informed of [facility]'s ownership interest in [facility] and [facility], as well as of the fact that they are under no obligation to receive care at these facilities.  PASRR submitted to EDS on       PASRR number received on       Existing PASRR number confirmed on 01/21/16     FL2 transmitted to all facilities in geographic area requested by pt/family on 01/21/16     FL2 transmitted to all facilities within larger geographic area on       Patient informed that his/her managed care company has contracts with or will negotiate with certain facilities, including the following:            Patient/family informed of bed offers received.  Patient chooses bed at       Physician recommends and patient chooses bed at      Patient to be transferred to   on  .  Patient to be transferred to facility by       Patient family notified on   of transfer.  Name of family member notified:        PHYSICIAN Please sign FL2, Please prepare prescriptions, Please sign DNR     Additional Comment:     _______________________________________________ Lilly [place], LCSW 01/21/2016, 3:23 PM

## 2016-01-22 DIAGNOSIS — I10 Essential (primary) hypertension: Secondary | ICD-10-CM

## 2016-01-22 DIAGNOSIS — C787 Secondary malignant neoplasm of liver and intrahepatic bile duct: Secondary | ICD-10-CM

## 2016-01-22 DIAGNOSIS — R7881 Bacteremia: Secondary | ICD-10-CM | POA: Insufficient documentation

## 2016-01-22 DIAGNOSIS — R319 Hematuria, unspecified: Secondary | ICD-10-CM

## 2016-01-22 DIAGNOSIS — I482 Chronic atrial fibrillation: Secondary | ICD-10-CM

## 2016-01-22 LAB — CBC
HCT: 30.5 % — ABNORMAL LOW (ref 39.0–52.0)
Hemoglobin: 9.7 g/dL — ABNORMAL LOW (ref 13.0–17.0)
MCH: 29.4 pg (ref 26.0–34.0)
MCHC: 31.8 g/dL (ref 30.0–36.0)
MCV: 92.4 fL (ref 78.0–100.0)
PLATELETS: 207 10*3/uL (ref 150–400)
RBC: 3.3 MIL/uL — ABNORMAL LOW (ref 4.22–5.81)
RDW: 16.6 % — AB (ref 11.5–15.5)
WBC: 8.9 10*3/uL (ref 4.0–10.5)

## 2016-01-22 LAB — CULTURE, BLOOD (ROUTINE X 2)
CULTURE: NO GROWTH
CULTURE: NO GROWTH

## 2016-01-22 LAB — VANCOMYCIN, TROUGH: VANCOMYCIN TR: 18 ug/mL (ref 10.0–20.0)

## 2016-01-22 LAB — BASIC METABOLIC PANEL
ANION GAP: 12 (ref 5–15)
BUN: 6 mg/dL (ref 6–20)
CALCIUM: 8.8 mg/dL — AB (ref 8.9–10.3)
CO2: 27 mmol/L (ref 22–32)
CREATININE: 0.84 mg/dL (ref 0.61–1.24)
Chloride: 100 mmol/L — ABNORMAL LOW (ref 101–111)
GFR calc Af Amer: >60 mL/min (ref >60)
GLUCOSE: 90 mg/dL (ref 65–99)
Potassium: 3.9 mmol/L (ref 3.5–5.1)
Sodium: 139 mmol/L (ref 135–145)

## 2016-01-22 MED ORDER — VANCOMYCIN HCL IN DEXTROSE 1-5 GM/200ML-% IV SOLN
1000.0000 mg | INTRAVENOUS | Status: DC
  Administered 2016-01-22 – 2016-01-25 (×4): 1000 mg via INTRAVENOUS
  Filled 2016-01-22 (×3): qty 200

## 2016-01-22 NOTE — Progress Notes (Signed)
Spoke with daughter Keaston Hillenbrand and obtain consent for PICC insertion.  She expressed concerns of patient being fearful of procedure and requested PICC be done tomorrow when she may be present and there are two PICC nurses available.  Notify floor RN of request.  PICC needed for home antibiotic; presently has PIV.

## 2016-01-22 NOTE — Progress Notes (Signed)
UPDATE: LCSWA  followed up with patient daughter and gave bed offers. Patient daughter reported the co-pay and  additional cost of SNF would be too much for her to pay at this time. She spoke with representative with [facility] Patient expressed concern with insurance and not wanting to go down the same road, as done before with another SNF. Patient reported she would like for patient to receive twenty four hour care at home. LCSWA explained and educated patient that this might not be possible through [facility] unless privately paid through a company that offered it, if any. Patient expressed she would speak with case manager about options. She expressed patient is already set up with [facility] at this time.    Kathrin Greathouse, Latanya Presser, MSW Clinical Social Worker 5E and Psychiatric Service Line [phone] 01/22/2016  2:07 PM

## 2016-01-22 NOTE — Progress Notes (Addendum)
Pharmacy Antibiotic Follow-up Note  GEORDON SOLIN is a 82 y.o. year-old male admitted on 01/14/2016 with MRSA UTI and bacteremia.  Pharmacy dosing Vancomycin.   6/3: Repeat blood cultures from 5/28 remain NGTD.  Renal function improved since admission. -Vancomycin trough drawn this morning is therapeutic even with 30% decrease in dose from 6/1, still concerned about accumulation.  Will change to q24h dosing to hopefully avoid vancomycin accumulation with planned duration of 4 weeks therapy.   -Plan for PICC soon. ID following.   Plan: Change to Vancomycin 1g q24h Recheck VT at [facility].  Follow renal function and cultures.  Suggest monitoring SCr q48h due to advanced age    Temp (24hrs), Avg:98.5 F (36.9 C), Min:98.2 F (36.8 C), Max:98.7 F (37.1 C)   Recent Labs Lab 01/18/16 0431 01/19/16 0452 01/20/16 0622 01/21/16 0433 01/22/16 0441  WBC 6.3 7.7 8.8 7.9 8.9     Recent Labs Lab 01/16/16 0518 01/17/16 0407 01/19/16 0452 01/20/16 0622 01/22/16 0441  CREATININE 1.07 0.83 0.82 0.83 0.84   Estimated Creatinine Clearance: 65.2 mL/min (by C-G formula based on Cr of 0.84).    Allergies  Allergen Reactions  . Darifenacin Hydrobromide     REACTION: constipation  . Fluoxetine Hcl     REACTION: Very Depressed while taking   Antimicrobials this admission: 5/27 CTX>> 5/28 5/28 Vancomycin >>  Levels/dose changes this admission: 5/31 Vanc trough (prior to 5th dose) = 19, empirically reduce to 500mg  q12h for concern for accumulation with 4 weeks therapy 6/2 VT (prior to 5th dose) = 18 mcg/ml, reduce to 1g q24h  Microbiology results: 5/26 bcx x2:  BCID MRSA 5/26 ucx: >100K MRSA 5/26 MRSA PCR (+)--> chlx, bactroban 5/28 BCx2: NGTD  Thank you for allowing pharmacy to be a part of this patient's care.  Ralene Bathe, PharmD, BCPS 01/22/2016, 10:55 AM  Pager: [phone]

## 2016-01-22 NOTE — Evaluation (Signed)
Physical Therapy Evaluation Patient Details Name: [name] MRN: [id number] DOB: Dec 25, 1924 Today's Date: 01/22/2016   History of Present Illness  [name] is an 82 year old with history of dyslipidemia, hypertension, atrial fibrillation, coronary artery disease, medical hematuria hematuria.  CT scan of abdomen and pelvis significant for sigmoid mass and liver metastasis  Clinical Impression  The patient is  Much more deconditioned than PTA Where he was ambulating short distances. Today , requiring total assist for bed mobility, poor ability to sttand at the walker. The daughter is undecided about disposition. Pt admitted with above diagnosis. Pt currently with functional limitations due to the deficits listed below (see PT Problem List). Pt will benefit from skilled PT to increase their independence and safety with mobility to allow discharge to the venue listed below.       Follow Up Recommendations Home health PT;SNF;Supervision/Assistance - 24 hour (daughter is undecided.)    Equipment Recommendations   (hoyer lift.)    Recommendations for Other Services       Precautions / Restrictions Precautions Precautions: Fall Precaution Comments: equinovarus deformities of the ankles, wear shoes      Mobility  Bed Mobility Overal bed mobility: Needs Assistance Bed Mobility: Supine to Sit;Sit to Supine     Supine to sit: Max assist;+2 for physical assistance;+2 for safety/equipment;HOB elevated Sit to supine: Total assist;+2 for physical assistance;+2 for safety/equipment   General bed mobility comments: patient moved legs, assist with trunk to upright, bed pad to slide to edge. total assist back to bed.  Transfers Overall transfer level: Needs assistance Equipment used: Rolling walker (2 wheeled) Transfers: Sit to/from Stand Sit to Stand: Total assist;+2 physical assistance;+2 safety/equipment;From elevated surface         General transfer comment: attempted x  3 to stand  at the edge with bed raised, significant  retropulsion , legs  pushing back against the bed.  Ankles inverting.  Ambulation/Gait                Stairs            Wheelchair Mobility    Modified Rankin (Stroke Patients Only)       Balance Overall balance assessment: Needs assistance Sitting-balance support: Bilateral upper extremity supported;Feet supported Sitting balance-Leahy Scale: Poor   Postural control: Posterior lean Standing balance support: During functional activity;Bilateral upper extremity supported Standing balance-Leahy Scale: Zero                               Pertinent Vitals/Pain Pain Assessment: Faces Faces Pain Scale: Hurts little more Pain Location: knees Pain Descriptors / Indicators: Aching;Tightness Pain Intervention(s): Limited activity within patient's tolerance;Monitored during session    Home Living Family/patient expects to be discharged to:: Private residence Living Arrangements: Children Available Help at Discharge: Personal care attendant;Family;Available 24 hours/day Type of Home: House Home Access: Stairs to enter Entrance Stairs-Rails: None Entrance Stairs-Number of Steps: 3 Home Layout: One level Home Equipment: Walker - 2 wheels;Wheelchair - manual;Bedside commode      Prior Function Level of Independence: Needs assistance   Gait / Transfers Assistance Needed: walks very short distances with therapy and family with someone following with w/c.  ADL's / Homemaking Assistance Needed: dependent for all but feeding and grooming.   Comments: Pt has full time caregivers.  Pt spends a lot of time in bed and has caregivers to assist with toileting, dressing, bathing etc.     Hand Dominance  Extremity/Trunk Assessment   Upper Extremity Assessment: Generalized weakness           Lower Extremity Assessment: RLE deficits/detail;LLE deficits/detail RLE Deficits / Details: equino varus,   LLE  Deficits / Details: same as R, knee brace in plce  Cervical / Trunk Assessment: Kyphotic  Communication      Cognition Arousal/Alertness: Awake/alert Behavior During Therapy: WFL for tasks assessed/performed Overall Cognitive Status: History of cognitive impairments - at baseline                      General Comments      Exercises        Assessment/Plan    PT Assessment Patient needs continued PT services  PT Diagnosis Difficulty walking;Generalized weakness   PT Problem List Decreased strength;Decreased range of motion;Decreased activity tolerance;Decreased cognition;Impaired tone  PT Treatment Interventions DME instruction;Functional mobility training;Therapeutic activities;Therapeutic exercise;Patient/family education   PT Goals (Current goals can be found in the Care Plan section) Acute Rehab PT Goals Patient Stated Goal: to go home PT Goal Formulation: With patient/family Time For Goal Achievement: 02/05/16 Potential to Achieve Goals: Good    Frequency Min 3X/week   Barriers to discharge Decreased caregiver support      Co-evaluation               End of Session   Activity Tolerance: Patient tolerated treatment well;Patient limited by fatigue Patient left: in bed;with call bell/phone within reach;with nursing/sitter in room;with family/visitor present Nurse Communication: Mobility status;Need for lift equipment         Time: [id number] PT Time Calculation (min) (ACUTE ONLY): 70 min   Charges:   PT Evaluation $PT Eval Moderate Complexity: 1 Procedure PT Treatments $Therapeutic Activity: 38-52 mins $Self Care/Home Management: 8-22   PT G Codes:        [name], [name] 01/22/2016, 4:54 PM

## 2016-01-22 NOTE — Care Management Note (Signed)
Case Management Note  Patient Details  Name: [name] MRN: [id number] Date of Birth: 1925/01/04  Subjective/Objective:    MRSA bacteremia and UTI                Action/Plan: Discharge Planning:  NCM spoke to pt and Orren, [place] # [phone] at bedside. Dtr states she lives in home with pt and she has hired caregivers that come in from 10 am-9pm to assist pt in the home with ADL's. States [facility] RN comes to manage foley cath. Pt had HH PT come recently also. Dtr is concerned that because his health is declining she wanted to ensure that what care is provided is adequate. She does not want pt to go to SNF at this time. States her aides that are paid from his [employer] benefits that if pt goes to SNF he would lose the caregiver that has been working well with pt in the past year.  [place] and they cannot do the IV abx in the home. Pt has hospital bed, wheelchair, bedside commode, reclining lift chair and RW at home.    Expected Discharge Date:              Expected Discharge Plan:  [place]  In-House Referral:  Clinical Social Work  Discharge planning Services  CM Consult  Post Acute Care Choice:  Home Health, Resumption of Svcs/PTA Provider Choice offered to:  Adult Children  DME Arranged:  N/A DME Agency:  NA  HH Arranged:  RN, PT, Nurse's Aide, Social Work [employer] Agency:  [place]  Status of Service:  In process, will continue to follow  Medicare Important Message Given:  Yes Date Medicare IM Given:    Medicare IM give by:    Date Additional Medicare IM Given:    Additional Medicare Important Message give by:     If discussed at [place] of Stay Meetings, dates discussed:    Additional Comments:  Erenest Rasher, RN 01/22/2016, 5:12 PM

## 2016-01-22 NOTE — Progress Notes (Signed)
PROGRESS NOTE    [name]  [id number] DOB: 1925-05-12 DOA: 01/14/2016 PCP: No primary care provider on file.  Brief Narrative: [name] is an 82 year old with history of dyslipidemia, hypertension, atrial fibrillation, coronary artery disease, medical hematuria hematuria. He is on  warfarin for atrial fibrillation. Workup  CT scan of abdomen and pelvis significant for sigmoid mass and liver metastasis. [facility] for flexible sigmoidoscopy  on Wednesday, 01/19/2016, difficult for partially obstructed sigmoid mass, biopsy showing at least high-grade glandular dysplasia, thought to be by GI superficial/sampling error, finding most likely related to metastatic colon cancer, in by palliative medicine, no plan for him with therapy or further workup, hospice when appropriate. As well workup significant for MRSA bacteremia secondary to UTI, seen by ID, will need total of 3 weeks treatment tell 02/05/2016 on IV vancomycin, PICC line to be inserted.  Assessment & Plan:   Principal Problem:   MRSA bacteremia Active Problems:   Essential hypertension   UTI (lower urinary tract infection)   CAD (coronary artery disease)   Chronic systolic CHF (congestive heart failure) (HCC)   Long term (current) use of anticoagulants   Pressure ulcer   Dehydration   Acute renal failure (ARF) (HCC)   Hematuria   Chronic atrial fibrillation (HCC)   Metastasis to liver (HCC)   Sepsis ([place])   Acute kidney injury ([place])   Colonic mass   Weakness of both lower extremities   Bilateral foot-drop   Lewy body dementia without behavioral disturbance   Goals of care, counseling/discussion   Encounter for palliative care  Suspected metastatic colon cancer. - last colonoscopy in 2007 at which time had removal of malignant polyp. Surveillance exam later that year unremarkable. - CT scan of abdomen and pelvis performed in the emergency room showed a 4.3 x 2.7 cm mass involving sigmoid colon along with liver lesions  that could represent metastatic disease. - Status post sigmoidoscopy 5/31, with finding of partially obstructing sigmoid mass. - Pathology results came back as at least high-grade glandular dysplasia, ideas were discussed with Dr. Amedeo Plenty, he reports a superficial sample, most likely sample error, and he thinks the partially obstructing mass is related to malignancy.  MRSA bacteremia - Cultures on 5/26 is growing MRSA - urine is growing Staphylococcus aureus and I am suspecting this to be his source of infection. -Transthoracic echocardiogram performed on 01/17/2016 showing ejection fraction of [id number], grade 2 diastolic dysfunction, without evidence of vegetations. -Continue IV vancomycin. Had been on IV ceftriaxone which was discontinued. -Repeat blood cultures on 01/16/2016 with no growth to date. -Continue with IV vancomycin until 02/05/2016   Hematuria. -Initially presented with complaints of hematuria. He was anticoagulated with warfarin presented with INR of 2.76 and PTT of 28.8. -CT showed left renal pelvic stone without obstructive changes. -Urine analysis consistent with urinary tract infection. -Initially he was treated with ceftriaxone however urine cultures growing methicillin-resistant Staphylococcus aureus, antibiotic regimen changed to IV vancomycin. Urine clearing up.  History of atrial fibrillation -CHADSVASC score 5 -Continue amiodarone 100 mg by mouth daily - Has been anticoagulated with warfarin, discussed with daughter, giving for poor prognosis prognosis, diagnosis of stage IV cancer no further workup and this point, will discontinue anticoagulation to minimize discomfort for patient and labs checked.   Dyslipidemia. -Continue pravastatin 80 mg by mouth daily  Acute kidney injury. -Resolved with IV fluid resuscitation  Acute blood loss anemia. -Likely secondary to hematuria and possibly colon cancer. Lab work as revealed a downward trend in his  hemoglobin to 8.0 from  9.6 2 days ago. -He was transfuse 1 unit of packed red blood cells on 01/16/2016 Hemoglobin now at 9.7.  MRSA Urinary tract infection. -Urinalysis revealed the presence of many bacteria with leukocytes. -Urine cultures now growing methicillin-resistant Staphylococcus aureus as blood cultures reported by microbiology to be growing methicillin-resistant Staphylococcus aureus -Continue IV vancomycin.   Sepsis -Present on admission, evidenced by a white count of 29,800, blood cultures now returning positive for methicillin-resistant Staphylococcus aureus, acute kidney injury. -The source of infection likely urinary tract given urine cultures growing Staphylococcus aureus as well. -White count trending down and has normalized, he was started on IV vancomycin. Repeat blood cultures negative.  Goals of care: Recent would like to continue with IV antibiotics, treat his bacteremia, but they do not wish for any further workup or treatment for his cancer.  DVT prophylaxis: Heparin Code Status: DNR Family Communication: Updated patient. No family at bedside.  Disposition Plan: Need SNF placement, will observe over the weekend, will need PICC line, hopefully SNF discharge in early next week.   Consultants:   GI  ID  Palliative medicine   Procedures:   Antimicrobials:   Ceftriaxone 1 g IV every 24 hours discontinued on 01/17/2016  Vancomycin started on 01/17/2016  Subjective: Patient alert. Denies chest pain. No shortness of breath.  Objective: Filed Vitals:   01/21/16 1452 01/21/16 2216 01/22/16 0500 01/22/16 0511  BP: 139/72 144/63  156/69  Pulse: 53 55  57  Temp: 98.6 F (37 C) 98.7 F (37.1 C)  98.2 F (36.8 C)  TempSrc: Oral Oral  Oral  Resp: 24 20  20   Height:      Weight:   77.3 kg (170 lb 6.7 oz)   SpO2: 97% 97%  96%    Intake/Output Summary (Last 24 hours) at 01/22/16 1513 Last data filed at 01/22/16 1114  Gross per 24 hour  Intake    800 ml  Output   1750 ml   Net   -950 ml   Filed Weights   01/20/16 0650 01/21/16 0652 01/22/16 0500  Weight: 81 kg (178 lb 9.2 oz) 78 kg (171 lb 15.3 oz) 77.3 kg (170 lb 6.7 oz)    Examination:  General exam: Elderly gentleman, no acute distress Respiratory system: Clear to auscultation. Respiratory effort normal. Cardiovascular system: S1 & S2 heard, RRR. No JVD, murmurs, rubs, gallops or clicks. No pedal edema. Gastrointestinal system: Abdomen is nondistended, soft and nontender. No organomegaly or masses felt. Normal bowel sounds heard. Central nervous system: Alert and oriented. No focal neurological deficits. Extremities: Symmetric 5 x 5 power. Skin: No rashes, lesions or ulcers     Data Reviewed: I have personally reviewed following labs and imaging studies  CBC:  Recent Labs Lab 01/18/16 0431 01/19/16 0452 01/20/16 0622 01/21/16 0433 01/22/16 0441  WBC 6.3 7.7 8.8 7.9 8.9  HGB 9.8* 9.7* 9.9* 9.6* 9.7*  HCT 29.5* 29.5* 30.9* 30.2* 30.5*  MCV 89.9 91.0 91.2 89.6 92.4  PLT 158 161 179 178 [id number]   Basic Metabolic Panel:  Recent Labs Lab 01/16/16 0518 01/17/16 0407 01/19/16 0452 01/20/16 0622 01/22/16 0441  NA 138 138 141 138 139  K 4.1 4.0 3.8 4.0 3.9  CL 110 107 109 107 100*  CO2 23 25 26 25 27   GLUCOSE 93 94 93 90 90  BUN 22* 14 6 6 6   CREATININE 1.07 0.83 0.82 0.83 0.84  CALCIUM 8.2* 8.1* 8.4* 8.4* 8.8*   GFR: Estimated  Creatinine Clearance: 65.2 mL/min (by C-G formula based on Cr of 0.84). Liver Function Tests: No results for input(s): AST, ALT, ALKPHOS, BILITOT, PROT, ALBUMIN in the last 168 hours. No results for input(s): LIPASE, AMYLASE in the last 168 hours. No results for input(s): AMMONIA in the last 168 hours. Coagulation Profile:  Recent Labs Lab 01/16/16 0518 01/17/16 0407 01/18/16 0431 01/20/16 0622  INR 2.40* 2.07* 1.88* 1.58*   Cardiac Enzymes: No results for input(s): CKTOTAL, CKMB, CKMBINDEX, TROPONINI in the last 168 hours. BNP (last 3 results) No  results for input(s): PROBNP in the last 8760 hours. HbA1C: No results for input(s): HGBA1C in the last 72 hours. CBG: No results for input(s): GLUCAP in the last 168 hours. Lipid Profile: No results for input(s): CHOL, HDL, LDLCALC, TRIG, CHOLHDL, LDLDIRECT in the last 72 hours. Thyroid Function Tests: No results for input(s): TSH, T4TOTAL, FREET4, T3FREE, THYROIDAB in the last 72 hours. Anemia Panel: No results for input(s): VITAMINB12, FOLATE, FERRITIN, TIBC, IRON, RETICCTPCT in the last 72 hours. Sepsis Labs: No results for input(s): PROCALCITON, LATICACIDVEN in the last 168 hours.  Recent Results (from the past 240 hour(s))  Urine culture     Status: Abnormal   Collection Time: 01/14/16  4:05 PM  Result Value Ref Range Status   Specimen Description URINE, CLEAN CATCH  Final   Special Requests NONE  Final   Culture (A)  Final    >=100,000 COLONIES/mL METHICILLIN RESISTANT STAPHYLOCOCCUS AUREUS   Report Status 01/18/2016 FINAL  Final   Organism ID, Bacteria METHICILLIN RESISTANT STAPHYLOCOCCUS AUREUS (A)  Final      Susceptibility   Methicillin resistant staphylococcus aureus - MIC*    CIPROFLOXACIN Value in next row Resistant      RESISTANT>=4    GENTAMICIN Value in next row Sensitive      SENSITIVE<=0.5    NITROFURANTOIN Value in next row Sensitive      SENSITIVE<=16    OXACILLIN Value in next row Resistant      RESISTANT>=4    TETRACYCLINE Value in next row Sensitive      SENSITIVE<=1    VANCOMYCIN Value in next row Sensitive      SENSITIVE<=0.5    TRIMETH/SULFA Value in next row Sensitive      SENSITIVE<=10    CLINDAMYCIN Value in next row Sensitive      SENSITIVE<=0.25    RIFAMPIN Value in next row Sensitive      SENSITIVE<=0.5    * >=100,000 COLONIES/mL METHICILLIN RESISTANT STAPHYLOCOCCUS AUREUS  Culture, blood (Routine X 2) w Reflex to ID Panel     Status: Abnormal   Collection Time: 01/14/16  5:25 PM  Result Value Ref Range Status   Specimen Description  BLOOD LEFT ANTECUBITAL  Final   Special Requests BOTTLES DRAWN AEROBIC AND ANAEROBIC 5CC  Final   Culture  Setup Time   Final    GRAM POSITIVE COCCI IN CLUSTERS IN BOTH AEROBIC AND ANAEROBIC BOTTLES CRITICAL RESULT CALLED TO, READ BACK BY AND VERIFIED WITH: RN J MCNULTY AT 2103 [id number] MARTINB    Culture (A)  Final    STAPHYLOCOCCUS AUREUS SUSCEPTIBILITIES PERFORMED ON PREVIOUS CULTURE WITHIN THE LAST 5 DAYS. Performed at [facility]    Report Status 01/18/2016 FINAL  Final  Culture, blood (Routine X 2) w Reflex to ID Panel     Status: Abnormal   Collection Time: 01/14/16  9:09 PM  Result Value Ref Range Status   Specimen Description BLOOD RIGHT ARM  Final  Special Requests BOTTLES DRAWN AEROBIC AND ANAEROBIC 5CC  Final   Culture  Setup Time   Final    GRAM POSITIVE COCCI IN CLUSTERS IN BOTH AEROBIC AND ANAEROBIC BOTTLES CRITICAL RESULT CALLED TO, READ BACK BY AND VERIFIED WITH: TERRI GREEN PHARMD 01/16/16 AT 1924 BY [place] Performed at [place] (A)  Final   Report Status 01/18/2016 FINAL  Final   Organism ID, Bacteria METHICILLIN RESISTANT STAPHYLOCOCCUS AUREUS  Final      Susceptibility   Methicillin resistant staphylococcus aureus - MIC*    CIPROFLOXACIN >=8 RESISTANT Resistant     ERYTHROMYCIN >=8 RESISTANT Resistant     GENTAMICIN <=0.5 SENSITIVE Sensitive     OXACILLIN >=4 RESISTANT Resistant     TETRACYCLINE <=1 SENSITIVE Sensitive     VANCOMYCIN <=0.5 SENSITIVE Sensitive     TRIMETH/SULFA <=10 SENSITIVE Sensitive     CLINDAMYCIN <=0.25 SENSITIVE Sensitive     RIFAMPIN <=0.5 SENSITIVE Sensitive     Inducible Clindamycin NEGATIVE Sensitive     * METHICILLIN RESISTANT STAPHYLOCOCCUS AUREUS  Blood Culture ID Panel (Reflexed)     Status: Abnormal   Collection Time: 01/14/16  9:09 PM  Result Value Ref Range Status   Enterococcus species NOT DETECTED NOT DETECTED Final   Vancomycin resistance  NOT DETECTED NOT DETECTED Final   Listeria monocytogenes NOT DETECTED NOT DETECTED Final   Staphylococcus species NOT DETECTED NOT DETECTED Final   Staphylococcus aureus DETECTED (A) NOT DETECTED Final    Comment: CRITICAL RESULT CALLED TO, READ BACK BY AND VERIFIED WITH: TERRI GREEN PHARMD 01/16/16 AT 1924 BY [place]    Methicillin resistance DETECTED (A) NOT DETECTED Final    Comment: CRITICAL RESULT CALLED TO, READ BACK BY AND VERIFIED WITH: TERRI GREEN PHARMD 01/16/16 AT 1924 BY [place]    Streptococcus species NOT DETECTED NOT DETECTED Final   Streptococcus agalactiae NOT DETECTED NOT DETECTED Final   Streptococcus pneumoniae NOT DETECTED NOT DETECTED Final   Streptococcus pyogenes NOT DETECTED NOT DETECTED Final   Acinetobacter baumannii NOT DETECTED NOT DETECTED Final   Enterobacteriaceae species NOT DETECTED NOT DETECTED Final   Enterobacter cloacae complex NOT DETECTED NOT DETECTED Final   Escherichia coli NOT DETECTED NOT DETECTED Final   Klebsiella oxytoca NOT DETECTED NOT DETECTED Final   Klebsiella pneumoniae NOT DETECTED NOT DETECTED Final   Proteus species NOT DETECTED NOT DETECTED Final   Serratia marcescens NOT DETECTED NOT DETECTED Final   Carbapenem resistance NOT DETECTED NOT DETECTED Final   Haemophilus influenzae NOT DETECTED NOT DETECTED Final   Neisseria meningitidis NOT DETECTED NOT DETECTED Final   Pseudomonas aeruginosa NOT DETECTED NOT DETECTED Final   Candida albicans NOT DETECTED NOT DETECTED Final   Candida glabrata NOT DETECTED NOT DETECTED Final   Candida krusei NOT DETECTED NOT DETECTED Final   Candida parapsilosis NOT DETECTED NOT DETECTED Final   Candida tropicalis NOT DETECTED NOT DETECTED Final    Comment: Performed at [facility]  MRSA PCR Screening     Status: Abnormal   Collection Time: 01/14/16 10:42 PM  Result Value Ref Range Status   MRSA by PCR POSITIVE (A) NEGATIVE Final    Comment:        The GeneXpert MRSA Assay  (FDA approved for NASAL specimens only), is one component of a comprehensive MRSA colonization surveillance program. It is not intended to diagnose MRSA infection nor to guide or monitor treatment for MRSA infections. RESULT CALLED TO, READ BACK  BY AND VERIFIED WITH: A LAMB RN 0138 01/15/16 A NAVARRO   Culture, blood (Routine X 2) w Reflex to ID Panel     Status: None   Collection Time: 01/16/16  9:50 PM  Result Value Ref Range Status   Specimen Description BLOOD RIGHT ANTECUBITAL  Final   Special Requests BOTTLES DRAWN AEROBIC AND ANAEROBIC 10CC  Final   Culture   Final    NO GROWTH 5 DAYS Performed at [facility]    Report Status 01/22/2016 FINAL  Final  Culture, blood (Routine X 2) w Reflex to ID Panel     Status: None   Collection Time: 01/16/16  9:51 PM  Result Value Ref Range Status   Specimen Description BLOOD RIGHT ARM  Final   Special Requests BOTTLES DRAWN AEROBIC ONLY 5CC  Final   Culture   Final    NO GROWTH 5 DAYS Performed at [facility]    Report Status 01/22/2016 FINAL  Final         Radiology Studies: No results found.      Scheduled Meds: . sodium chloride   Intravenous Once  . amiodarone  100 mg Oral Daily  . buPROPion  75 mg Oral BID  . heparin subcutaneous  5,000 Units Subcutaneous Q8H  . mirabegron ER  25 mg Oral Daily  . pantoprazole  40 mg Oral BID AC  . polyethylene glycol-electrolytes  4,000 mL Oral Once  . pravastatin  80 mg Oral Daily  . sodium chloride flush  3 mL Intravenous Q12H  . traZODone  50 mg Oral QHS  . vancomycin  1,000 mg Intravenous Q24H   Continuous Infusions: . sodium chloride 500 mL (01/19/16 1237)  . sodium chloride       LOS: 8 days    Time spent: 35 min    Omayra Tulloch, MD [facility] Pager [phone]  If 7PM-7AM, please contact night-coverage [email or web address] Password [email or web address] 01/22/2016, 3:13 PM

## 2016-01-23 DIAGNOSIS — A4102 Sepsis due to Methicillin resistant Staphylococcus aureus: Secondary | ICD-10-CM

## 2016-01-23 DIAGNOSIS — E860 Dehydration: Secondary | ICD-10-CM

## 2016-01-23 LAB — BASIC METABOLIC PANEL
Anion gap: 6 (ref 5–15)
BUN: 6 mg/dL (ref 6–20)
CALCIUM: 8.5 mg/dL — AB (ref 8.9–10.3)
CO2: 26 mmol/L (ref 22–32)
CREATININE: 0.83 mg/dL (ref 0.61–1.24)
Chloride: 106 mmol/L (ref 101–111)
GFR calc non Af Amer: >60 mL/min (ref >60)
Glucose, Bld: 91 mg/dL (ref 65–99)
Potassium: 3.7 mmol/L (ref 3.5–5.1)
SODIUM: 138 mmol/L (ref 135–145)

## 2016-01-23 LAB — CBC
HCT: 30.3 % — ABNORMAL LOW (ref 39.0–52.0)
Hemoglobin: 9.7 g/dL — ABNORMAL LOW (ref 13.0–17.0)
MCH: 29.5 pg (ref 26.0–34.0)
MCHC: 32 g/dL (ref 30.0–36.0)
MCV: 92.1 fL (ref 78.0–100.0)
Platelets: 194 10*3/uL (ref 150–400)
RBC: 3.29 MIL/uL — ABNORMAL LOW (ref 4.22–5.81)
RDW: 16.5 % — AB (ref 11.5–15.5)
WBC: 7.8 10*3/uL (ref 4.0–10.5)

## 2016-01-23 MED ORDER — SODIUM CHLORIDE 0.9% FLUSH
10.0000 mL | Freq: Two times a day (BID) | INTRAVENOUS | Status: DC
  Administered 2016-01-24: 10 mL

## 2016-01-23 MED ORDER — SODIUM CHLORIDE 0.9% FLUSH
10.0000 mL | INTRAVENOUS | Status: DC | PRN
  Administered 2016-01-24: 10 mL
  Filled 2016-01-23: qty 40

## 2016-01-23 NOTE — Progress Notes (Signed)
PT Cancellation Note  Patient Details Name: [name] MRN: [id number] DOB: 1925/04/23   Cancelled Treatment:    Reason Eval/Treat Not Completed: Other (comment);Patient at procedure or test/unavailable for PICC  then RN in room for extended time. will check back tomorrow.)   Stormy, Webb 01/23/2016, 12:15 PM Tresa Endo PT [phone]

## 2016-01-23 NOTE — Progress Notes (Signed)
PROGRESS NOTE    [name]  [id number] DOB: [birth year] DOA: 01/14/2016 PCP: No primary care provider on file.  Brief Narrative: [name] is an 82 year old with history of dyslipidemia, hypertension, atrial fibrillation, coronary artery disease, medical hematuria hematuria. He is on  warfarin for atrial fibrillation. Workup  CT scan of abdomen and pelvis significant for sigmoid mass and liver metastasis. [facility] for flexible sigmoidoscopy  on Wednesday, 01/19/2016, difficult for partially obstructed sigmoid mass, biopsy showing at least high-grade glandular dysplasia, thought to be by GI superficial/sampling error, finding most likely related to metastatic colon cancer, in by palliative medicine, no plan for him with therapy or further workup, hospice when appropriate. As well workup significant for MRSA bacteremia secondary to UTI, seen by ID, will need total of 3 weeks treatment tell 02/05/2016 on IV vancomycin, PICC line to be inserted.  Assessment & Plan:   Principal Problem:   MRSA bacteremia Active Problems:   Essential hypertension   UTI (lower urinary tract infection)   CAD (coronary artery disease)   Chronic systolic CHF (congestive heart failure) (HCC)   Long term (current) use of anticoagulants   Pressure ulcer   Dehydration   Acute renal failure (ARF) (HCC)   Hematuria   Chronic atrial fibrillation (HCC)   Metastasis to liver (HCC)   Sepsis ([place])   Acute kidney injury ([place])   Colonic mass   Weakness of both lower extremities   Bilateral foot-drop   Lewy body dementia without behavioral disturbance   Goals of care, counseling/discussion   Encounter for palliative care   Bacteremia  Suspected metastatic colon cancer. - last colonoscopy in 2007 at which time had removal of malignant polyp. Surveillance exam later that year unremarkable. - CT scan of abdomen and pelvis performed in the emergency room showed a 4.3 x 2.7 cm mass involving sigmoid colon along with  liver lesions that could represent metastatic disease. - Status post sigmoidoscopy 5/31, with finding of partially obstructing sigmoid mass. - Pathology results came back as at least high-grade glandular dysplasia, ideas were discussed with Dr. Amedeo Plenty, he reports a superficial sample, most likely sample error, and he thinks the partially obstructing mass is related to malignancy.  MRSA bacteremia - Cultures on 5/26 is growing MRSA - urine is growing Staphylococcus aureus and I am suspecting this to be his source of infection. -Transthoracic echocardiogram performed on 01/17/2016 showing ejection fraction of [id number], grade 2 diastolic dysfunction, without evidence of vegetations. -Continue IV vancomycin. Had been on IV ceftriaxone which was discontinued. -Repeat blood cultures on 01/16/2016 with no growth to date. -Continue with IV vancomycin until 02/05/2016   Hematuria. -Initially presented with complaints of hematuria. He was anticoagulated with warfarin presented with INR of 2.76 and PTT of 28.8. -CT showed left renal pelvic stone without obstructive changes. -Urine analysis consistent with urinary tract infection. -Initially he was treated with ceftriaxone however urine cultures growing methicillin-resistant Staphylococcus aureus, antibiotic regimen changed to IV vancomycin. Urine clearing up.  History of atrial fibrillation -CHADSVASC score 5 -Continue amiodarone 100 mg by mouth daily - Has been anticoagulated with warfarin, discussed with daughter, giving for poor prognosis prognosis, diagnosis of stage IV cancer no further workup and this point, will discontinue anticoagulation to minimize discomfort for patient and labs checked.   Dyslipidemia. -Continue pravastatin 80 mg by mouth daily  Acute kidney injury. -Resolved with IV fluid resuscitation  Acute blood loss anemia. -Likely secondary to hematuria and possibly colon cancer. Lab work as revealed a downward  trend in his  hemoglobin to 8.0 from 9.6 3 days ago. -He was transfuse 1 unit of packed red blood cells on 01/16/2016 Hemoglobin now at 9.7.  MRSA Urinary tract infection. -Urinalysis revealed the presence of many bacteria with leukocytes. -Urine cultures now growing methicillin-resistant Staphylococcus aureus as blood cultures reported by microbiology to be growing methicillin-resistant Staphylococcus aureus -Continue IV vancomycin.   Sepsis -Present on admission, evidenced by a white count of 29,800, blood cultures now returning positive for methicillin-resistant Staphylococcus aureus, acute kidney injury. -The source of infection likely urinary tract given urine cultures growing Staphylococcus aureus as well. -White count trending down and has normalized, he was started on IV vancomycin. Repeat blood cultures negative.  Goals of care: Recent would like to continue with IV antibiotics, treat his bacteremia, but they do not wish for any further workup or treatment for his cancer.  DVT prophylaxis: Heparin Code Status: DNR Family Communication: Updated patient. No family at bedside.  Disposition Plan: Need SNF placement, will observe over the weekend, will need PICC line, hopefully SNF discharge in early next week.   Consultants:   GI  ID  Palliative medicine   Procedures:   Antimicrobials:   Ceftriaxone 1 g IV every 24 hours discontinued on 01/17/2016  Vancomycin started on 01/17/2016  Subjective: Patient alert. Denies chest pain. No shortness of breath.States family deciding on SNF vs Home with Home Health vs hospice.  Objective: Filed Vitals:   01/22/16 0511 01/22/16 1600 01/22/16 2300 01/23/16 0625  BP: 156/69 131/67 150/69 148/61  Pulse: 57 57 58 57  Temp: 98.2 F (36.8 C) 98.7 F (37.1 C) 98.1 F (36.7 C) 98.2 F (36.8 C)  TempSrc: Oral Oral Oral Oral  Resp: 20 20 20 20   Height:      Weight:    75.7 kg (166 lb 14.2 oz)  SpO2: 96% 97% 96% 97%    Intake/Output  Summary (Last 24 hours) at 01/23/16 1118 Last data filed at 01/23/16 [id number]  Gross per 24 hour  Intake    120 ml  Output    810 ml  Net   -690 ml   Filed Weights   01/21/16 0652 01/22/16 0500 01/23/16 0625  Weight: 78 kg (171 lb 15.3 oz) 77.3 kg (170 lb 6.7 oz) 75.7 kg (166 lb 14.2 oz)    Examination:  General exam: Elderly gentleman, no acute distress Respiratory system: Clear to auscultation anterior lung fields. Respiratory effort normal. Cardiovascular system: S1 & S2 heard, RRR. No JVD, murmurs, rubs, gallops or clicks. No pedal edema. Gastrointestinal system: Abdomen is nondistended, soft and nontender. No organomegaly or masses felt. Normal bowel sounds heard. Central nervous system: Alert and oriented. No focal neurological deficits. Extremities: Symmetric 5 x 5 power. Skin: No rashes, lesions or ulcers     Data Reviewed: I have personally reviewed following labs and imaging studies  CBC:  Recent Labs Lab 01/19/16 0452 01/20/16 0622 01/21/16 0433 01/22/16 0441 01/23/16 0433  WBC 7.7 8.8 7.9 8.9 7.8  HGB 9.7* 9.9* 9.6* 9.7* 9.7*  HCT 29.5* 30.9* 30.2* 30.5* 30.3*  MCV 91.0 91.2 89.6 92.4 92.1  PLT 161 179 178 207 [id number]   Basic Metabolic Panel:  Recent Labs Lab 01/17/16 0407 01/19/16 0452 01/20/16 0622 01/22/16 0441 01/23/16 0433  NA 138 141 138 139 138  K 4.0 3.8 4.0 3.9 3.7  CL 107 109 107 100* 106  CO2 25 26 25 27 26   GLUCOSE 94 93 90 90 91  BUN 14 6  6 6 6   CREATININE 0.83 0.82 0.83 0.84 0.83  CALCIUM 8.1* 8.4* 8.4* 8.8* 8.5*   GFR: Estimated Creatinine Clearance: 64.6 mL/min (by C-G formula based on Cr of 0.83). Liver Function Tests: No results for input(s): AST, ALT, ALKPHOS, BILITOT, PROT, ALBUMIN in the last 168 hours. No results for input(s): LIPASE, AMYLASE in the last 168 hours. No results for input(s): AMMONIA in the last 168 hours. Coagulation Profile:  Recent Labs Lab 01/17/16 0407 01/18/16 0431 01/20/16 0622  INR 2.07* 1.88*  1.58*   Cardiac Enzymes: No results for input(s): CKTOTAL, CKMB, CKMBINDEX, TROPONINI in the last 168 hours. BNP (last 3 results) No results for input(s): PROBNP in the last 8760 hours. HbA1C: No results for input(s): HGBA1C in the last 72 hours. CBG: No results for input(s): GLUCAP in the last 168 hours. Lipid Profile: No results for input(s): CHOL, HDL, LDLCALC, TRIG, CHOLHDL, LDLDIRECT in the last 72 hours. Thyroid Function Tests: No results for input(s): TSH, T4TOTAL, FREET4, T3FREE, THYROIDAB in the last 72 hours. Anemia Panel: No results for input(s): VITAMINB12, FOLATE, FERRITIN, TIBC, IRON, RETICCTPCT in the last 72 hours. Sepsis Labs: No results for input(s): PROCALCITON, LATICACIDVEN in the last 168 hours.  Recent Results (from the past 240 hour(s))  Urine culture     Status: Abnormal   Collection Time: 01/14/16  4:05 PM  Result Value Ref Range Status   Specimen Description URINE, CLEAN CATCH  Final   Special Requests NONE  Final   Culture (A)  Final    >=100,000 COLONIES/mL METHICILLIN RESISTANT STAPHYLOCOCCUS AUREUS   Report Status 01/18/2016 FINAL  Final   Organism ID, Bacteria METHICILLIN RESISTANT STAPHYLOCOCCUS AUREUS (A)  Final      Susceptibility   Methicillin resistant staphylococcus aureus - MIC*    CIPROFLOXACIN Value in next row Resistant      RESISTANT>=4    GENTAMICIN Value in next row Sensitive      SENSITIVE<=0.5    NITROFURANTOIN Value in next row Sensitive      SENSITIVE<=16    OXACILLIN Value in next row Resistant      RESISTANT>=4    TETRACYCLINE Value in next row Sensitive      SENSITIVE<=1    VANCOMYCIN Value in next row Sensitive      SENSITIVE<=0.5    TRIMETH/SULFA Value in next row Sensitive      SENSITIVE<=10    CLINDAMYCIN Value in next row Sensitive      SENSITIVE<=0.25    RIFAMPIN Value in next row Sensitive      SENSITIVE<=0.5    * >=100,000 COLONIES/mL METHICILLIN RESISTANT STAPHYLOCOCCUS AUREUS  Culture, blood (Routine X 2) w  Reflex to ID Panel     Status: Abnormal   Collection Time: 01/14/16  5:25 PM  Result Value Ref Range Status   Specimen Description BLOOD LEFT ANTECUBITAL  Final   Special Requests BOTTLES DRAWN AEROBIC AND ANAEROBIC 5CC  Final   Culture  Setup Time   Final    GRAM POSITIVE COCCI IN CLUSTERS IN BOTH AEROBIC AND ANAEROBIC BOTTLES CRITICAL RESULT CALLED TO, READ BACK BY AND VERIFIED WITH: RN J MCNULTY AT 2103 [id number] MARTINB    Culture (A)  Final    STAPHYLOCOCCUS AUREUS SUSCEPTIBILITIES PERFORMED ON PREVIOUS CULTURE WITHIN THE LAST 5 DAYS. Performed at [facility]    Report Status 01/18/2016 FINAL  Final  Culture, blood (Routine X 2) w Reflex to ID Panel     Status: Abnormal   Collection Time: 01/14/16  9:09 PM  Result Value Ref Range Status   Specimen Description BLOOD RIGHT ARM  Final   Special Requests BOTTLES DRAWN AEROBIC AND ANAEROBIC 5CC  Final   Culture  Setup Time   Final    GRAM POSITIVE COCCI IN CLUSTERS IN BOTH AEROBIC AND ANAEROBIC BOTTLES CRITICAL RESULT CALLED TO, READ BACK BY AND VERIFIED WITH: TERRI GREEN PHARMD 01/16/16 AT 1924 BY [place] Performed at [place] (A)  Final   Report Status 01/18/2016 FINAL  Final   Organism ID, Bacteria METHICILLIN RESISTANT STAPHYLOCOCCUS AUREUS  Final      Susceptibility   Methicillin resistant staphylococcus aureus - MIC*    CIPROFLOXACIN >=8 RESISTANT Resistant     ERYTHROMYCIN >=8 RESISTANT Resistant     GENTAMICIN <=0.5 SENSITIVE Sensitive     OXACILLIN >=4 RESISTANT Resistant     TETRACYCLINE <=1 SENSITIVE Sensitive     VANCOMYCIN <=0.5 SENSITIVE Sensitive     TRIMETH/SULFA <=10 SENSITIVE Sensitive     CLINDAMYCIN <=0.25 SENSITIVE Sensitive     RIFAMPIN <=0.5 SENSITIVE Sensitive     Inducible Clindamycin NEGATIVE Sensitive     * METHICILLIN RESISTANT STAPHYLOCOCCUS AUREUS  Blood Culture ID Panel (Reflexed)     Status: Abnormal   Collection Time:  01/14/16  9:09 PM  Result Value Ref Range Status   Enterococcus species NOT DETECTED NOT DETECTED Final   Vancomycin resistance NOT DETECTED NOT DETECTED Final   Listeria monocytogenes NOT DETECTED NOT DETECTED Final   Staphylococcus species NOT DETECTED NOT DETECTED Final   Staphylococcus aureus DETECTED (A) NOT DETECTED Final    Comment: CRITICAL RESULT CALLED TO, READ BACK BY AND VERIFIED WITH: TERRI GREEN PHARMD 01/16/16 AT 1924 BY [place]    Methicillin resistance DETECTED (A) NOT DETECTED Final    Comment: CRITICAL RESULT CALLED TO, READ BACK BY AND VERIFIED WITH: TERRI GREEN PHARMD 01/16/16 AT 1924 BY [place]    Streptococcus species NOT DETECTED NOT DETECTED Final   Streptococcus agalactiae NOT DETECTED NOT DETECTED Final   Streptococcus pneumoniae NOT DETECTED NOT DETECTED Final   Streptococcus pyogenes NOT DETECTED NOT DETECTED Final   Acinetobacter baumannii NOT DETECTED NOT DETECTED Final   Enterobacteriaceae species NOT DETECTED NOT DETECTED Final   Enterobacter cloacae complex NOT DETECTED NOT DETECTED Final   Escherichia coli NOT DETECTED NOT DETECTED Final   Klebsiella oxytoca NOT DETECTED NOT DETECTED Final   Klebsiella pneumoniae NOT DETECTED NOT DETECTED Final   Proteus species NOT DETECTED NOT DETECTED Final   Serratia marcescens NOT DETECTED NOT DETECTED Final   Carbapenem resistance NOT DETECTED NOT DETECTED Final   Haemophilus influenzae NOT DETECTED NOT DETECTED Final   Neisseria meningitidis NOT DETECTED NOT DETECTED Final   Pseudomonas aeruginosa NOT DETECTED NOT DETECTED Final   Candida albicans NOT DETECTED NOT DETECTED Final   Candida glabrata NOT DETECTED NOT DETECTED Final   Candida krusei NOT DETECTED NOT DETECTED Final   Candida parapsilosis NOT DETECTED NOT DETECTED Final   Candida tropicalis NOT DETECTED NOT DETECTED Final    Comment: Performed at [facility]  MRSA PCR Screening     Status: Abnormal   Collection Time: 01/14/16 10:42 PM   Result Value Ref Range Status   MRSA by PCR POSITIVE (A) NEGATIVE Final    Comment:        The GeneXpert MRSA Assay (FDA approved for NASAL specimens only), is one component of a comprehensive MRSA colonization surveillance program. It is not  intended to diagnose MRSA infection nor to guide or monitor treatment for MRSA infections. RESULT CALLED TO, READ BACK BY AND VERIFIED WITH: A LAMB RN 0138 01/15/16 A NAVARRO   Culture, blood (Routine X 2) w Reflex to ID Panel     Status: None   Collection Time: 01/16/16  9:50 PM  Result Value Ref Range Status   Specimen Description BLOOD RIGHT ANTECUBITAL  Final   Special Requests BOTTLES DRAWN AEROBIC AND ANAEROBIC 10CC  Final   Culture   Final    NO GROWTH 5 DAYS Performed at [facility]    Report Status 01/22/2016 FINAL  Final  Culture, blood (Routine X 2) w Reflex to ID Panel     Status: None   Collection Time: 01/16/16  9:51 PM  Result Value Ref Range Status   Specimen Description BLOOD RIGHT ARM  Final   Special Requests BOTTLES DRAWN AEROBIC ONLY 5CC  Final   Culture   Final    NO GROWTH 5 DAYS Performed at [facility]    Report Status 01/22/2016 FINAL  Final         Radiology Studies: No results found.      Scheduled Meds: . sodium chloride   Intravenous Once  . amiodarone  100 mg Oral Daily  . buPROPion  75 mg Oral BID  . heparin subcutaneous  5,000 Units Subcutaneous Q8H  . mirabegron ER  25 mg Oral Daily  . pantoprazole  40 mg Oral BID AC  . polyethylene glycol-electrolytes  4,000 mL Oral Once  . pravastatin  80 mg Oral Daily  . sodium chloride flush  10-40 mL Intracatheter Q12H  . sodium chloride flush  3 mL Intravenous Q12H  . traZODone  50 mg Oral QHS  . vancomycin  1,000 mg Intravenous Q24H   Continuous Infusions: . sodium chloride 500 mL (01/19/16 1237)  . sodium chloride       LOS: 9 days    Time spent: 35 min    THOMPSON,DANIEL, MD [facility] Pager  [phone]  If 7PM-7AM, please contact night-coverage [email or web address] Password [email or web address] 01/23/2016, 11:18 AM

## 2016-01-23 NOTE — Progress Notes (Signed)
Peripherally Inserted Central Catheter/Midline Placement  The IV Nurse has discussed with the patient and/or persons authorized to consent for the patient, the purpose of this procedure and the potential benefits and risks involved with this procedure.  The benefits include less needle sticks, lab draws from the catheter and patient may be discharged home with the catheter.  Risks include, but not limited to, infection, bleeding, blood clot (thrombus formation), and puncture of an artery; nerve damage and irregular heat beat.  Alternatives to this procedure were also discussed.  PICC/Midline Placement Documentation  PICC Single Lumen 01/23/16 PICC Right Brachial 38 cm 0 cm (Active)  Indication for Insertion or Continuance of Line Home intravenous therapies (PICC only) 01/23/2016  8:33 AM  Exposed Catheter (cm) 0 cm 01/23/2016  8:33 AM  Site Assessment Clean;Dry;Intact 01/23/2016  8:33 AM  Line Status Flushed;Saline locked;Blood return noted 01/23/2016  8:33 AM  Dressing Type Transparent 01/23/2016  8:33 AM  Dressing Status Clean;Dry;Intact 01/23/2016  8:33 AM  Dressing Intervention New dressing 01/23/2016  8:33 AM  Dressing Change Due 01/30/16 01/23/2016  8:33 AM       [place] Payment 01/23/2016, 8:35 AM

## 2016-01-24 DIAGNOSIS — I5022 Chronic systolic (congestive) heart failure: Secondary | ICD-10-CM

## 2016-01-24 LAB — CBC
HEMATOCRIT: 30.3 % — AB (ref 39.0–52.0)
HEMOGLOBIN: 9.7 g/dL — AB (ref 13.0–17.0)
MCH: 28.9 pg (ref 26.0–34.0)
MCHC: 32 g/dL (ref 30.0–36.0)
MCV: 90.2 fL (ref 78.0–100.0)
Platelets: 195 10*3/uL (ref 150–400)
RBC: 3.36 MIL/uL — AB (ref 4.22–5.81)
RDW: 16.4 % — AB (ref 11.5–15.5)
WBC: 9.1 10*3/uL (ref 4.0–10.5)

## 2016-01-24 LAB — BASIC METABOLIC PANEL
ANION GAP: 6 (ref 5–15)
BUN: 7 mg/dL (ref 6–20)
CHLORIDE: 104 mmol/L (ref 101–111)
CO2: 28 mmol/L (ref 22–32)
Calcium: 8.6 mg/dL — ABNORMAL LOW (ref 8.9–10.3)
Creatinine, Ser: 0.87 mg/dL (ref 0.61–1.24)
Glucose, Bld: 86 mg/dL (ref 65–99)
POTASSIUM: 3.7 mmol/L (ref 3.5–5.1)
SODIUM: 138 mmol/L (ref 135–145)

## 2016-01-24 NOTE — Progress Notes (Signed)
Physical Therapy Treatment Patient Details Name: ROBBIE DOU MRN: [id number] DOB: March 03, 1925 Today's Date: 02/14/16    History of Present Illness Mr. Barbosa is an 82 year old with history of dyslipidemia, hypertension, atrial fibrillation, coronary artery disease, medical hematuria hematuria.  CT scan of abdomen and pelvis significant for sigmoid mass and liver metastasis    PT Comments    Pt declines mobility at this time however was agreeable to LE exercises.    Follow Up Recommendations  Home health PT;SNF;Supervision/Assistance - 24 hour (per notes, family undecided, no family present at time of session)     Equipment Recommendations  Other (comment) (hoyer lift)    Recommendations for Other Services       Precautions / Restrictions Precautions Precautions: Fall Precaution Comments: equinovarus deformities of the ankles, wear shoes    Mobility  Bed Mobility               General bed mobility comments: pt declined (NT also states pt declining turning this morning)  Transfers                    Ambulation/Gait                 Stairs            Wheelchair Mobility    Modified Rankin (Stroke Patients Only)       Balance                                    Cognition Arousal/Alertness: Awake/alert Behavior During Therapy: WFL for tasks assessed/performed Overall Cognitive Status: History of cognitive impairments - at baseline                      Exercises General Exercises - Lower Extremity Ankle Circles/Pumps: AAROM;Both;10 reps Quad Sets: AROM;Both;10 reps Short Arc Quad: AROM;Both;10 reps Heel Slides: AAROM;Both;10 reps;Limitations Heel Slides Limitations: L knee less ROM due to pain Hip ABduction/ADduction: AAROM;10 reps;Both (also isometric adduction with pillow x10)    General Comments        Pertinent Vitals/Pain Pain Assessment: Faces Faces Pain Scale: Hurts even more Pain Location: L  knee (wearing his brace) Pain Descriptors / Indicators: Aching;Tightness Pain Intervention(s): Monitored during session;Limited activity within patient's tolerance;Repositioned    Home Living                      Prior Function            PT Goals (current goals can now be found in the care plan section) Progress towards PT goals: Progressing toward goals    Frequency  Min 3X/week    PT Plan Current plan remains appropriate    Co-evaluation             End of Session   Activity Tolerance: Patient tolerated treatment well Patient left: in bed;with call bell/phone within reach;with bed alarm set     Time: 1041-1057 PT Time Calculation (min) (ACUTE ONLY): 16 min  Charges:  $Therapeutic Exercise: 8-22 mins                    G Codes:      Deaglan Lile,KATHrine E February 14, 2016, 12:49 PM Carmelia Bake, PT, DPT 14-Feb-2016 Pager: [phone]

## 2016-01-24 NOTE — Progress Notes (Signed)
Patient ID: [name], male   DOB: 1925/06/24, 82 y.o.   MRN: [id number]         [facility]    Date of Admission:  01/14/2016   Day 9 vancomycin  Principal Problem:   MRSA bacteremia Active Problems:   Sepsis ([place])   Metastasis to liver [facility])   Colonic mass   Essential hypertension   UTI (lower urinary tract infection)   CAD (coronary artery disease)   Chronic systolic CHF (congestive heart failure) (HCC)   Long term (current) use of anticoagulants   Pressure ulcer   Dehydration   Acute renal failure (ARF) (HCC)   Hematuria   Chronic atrial fibrillation (HCC)   Acute kidney injury (HCC)   Weakness of both lower extremities   Bilateral foot-drop   Lewy body dementia without behavioral disturbance   Goals of care, counseling/discussion   Encounter for palliative care   Bacteremia   . sodium chloride   Intravenous Once  . amiodarone  100 mg Oral Daily  . buPROPion  75 mg Oral BID  . heparin subcutaneous  5,000 Units Subcutaneous Q8H  . mirabegron ER  25 mg Oral Daily  . pantoprazole  40 mg Oral BID AC  . polyethylene glycol-electrolytes  4,000 mL Oral Once  . pravastatin  80 mg Oral Daily  . sodium chloride flush  10-40 mL Intracatheter Q12H  . sodium chloride flush  3 mL Intravenous Q12H  . traZODone  50 mg Oral QHS  . vancomycin  1,000 mg Intravenous Q24H    SUBJECTIVE: He is feeling well. He had no problems with PICC placement.  Review of Systems: Review of Systems  Constitutional: Negative for fever, chills and diaphoresis.  Respiratory: Negative for cough, sputum production and shortness of breath.   Cardiovascular: Negative for chest pain.  Gastrointestinal: Negative for nausea, vomiting, abdominal pain and diarrhea.  Musculoskeletal: Positive for joint pain. Negative for back pain.       No change in chronic knee pain  Neurological: Negative for headaches.    Past Medical History  Diagnosis Date  . Anemia     nos  .  History of colonic polyps   . Hyperlipidemia   . Hypertension   . Cancer ([place]) [id number]    hx of basal cell  . BPH (benign prostatic hyperplasia)   . Anxiety attack     panic   . Pre-syncope     echo 03/2010,with EF 60%,mild RV dilation,no significant abnormalities...event monitor for 3wks/episodes os sinus bradycardia with heart rate to low 40's occasionally at night(suspect while sleeping)  . Atrial fibrillation ([place])     a. Dx 11/2012 - TEE with possible LAA thrombus, started on amio/coumadin.  . Systolic CHF ([place])     a. EF 25-30% 11/2012 ?tachy-mediated. b. Not on ACEI at dc due to AKI, can consider as OP.  [name] CAD (coronary artery disease)     a. Mod CAD by cath 12/10/12 - 60% long proximal LAD, 60% prox PDA.  [name] AKI (acute kidney injury) ([place])     a. Peak Cr 4.17 11/2012 - felt to be post-obstructive.  . Urinary retention     a. Post obstructive, dc'd with foley 12/2012.  [name] Renal cyst     a. By renal US 11/2012, not candidate for further eval.  . Thrombus of left atrial appendage     a. Possible LAA thrombus by TEE 11/2012.    Social History  Substance Use Topics  .  Smoking status: Former Smoker    Quit date: 08/22/1983  . Smokeless tobacco: None  . Alcohol Use: No    Family History  Problem Relation Age of Onset  . Heart attack Father   . Heart failure Father 66    S/P Turp  . Heart disease Father 55    MI.Marland KitchenMarland KitchenCHF S/P TURP @ 48  . Stroke Brother 21  . Coronary artery disease Brother   . Lung cancer Brother    Allergies  Allergen Reactions  . Darifenacin Hydrobromide     REACTION: constipation  . Fluoxetine Hcl     REACTION: Very Depressed while taking    OBJECTIVE: Filed Vitals:   01/23/16 0625 01/23/16 1515 01/23/16 2058 01/24/16 0528  BP: 148/61 142/70 135/58 138/62  Pulse: 57 56 63 61  Temp: 98.2 F (36.8 C) 98.7 F (37.1 C) 98.8 F (37.1 C) 98 F (36.7 C)  TempSrc: Oral Oral Oral Oral  Resp: 20 20 20 19   Height:      Weight: 166 lb 14.2 oz (75.7 kg)      SpO2: 97% 97% 98% 99%   Body mass index is 22.63 kg/(m^2).  Physical Exam  Constitutional: He is oriented to person, place, and time.  He is alert and in no distress resting quietly in bed.  HENT:  He is hard of hearing.  Cardiovascular: Normal rate and regular rhythm.   No murmur heard. Pulmonary/Chest: Breath sounds normal. He has no wheezes. He has no rales.  Abdominal: Soft. There is no tenderness.  Musculoskeletal: Normal range of motion. He exhibits no edema or tenderness.  Neurological: He is alert and oriented to person, place, and time.  Skin:  New right arm PICC.  Psychiatric: Mood and affect normal.    Lab Results Lab Results  Component Value Date   WBC 9.1 01/24/2016   HGB 9.7* 01/24/2016   HCT 30.3* 01/24/2016   MCV 90.2 01/24/2016   PLT 195 01/24/2016    Lab Results  Component Value Date   CREATININE 0.87 01/24/2016   BUN 7 01/24/2016   NA 138 01/24/2016   K 3.7 01/24/2016   CL 104 01/24/2016   CO2 28 01/24/2016    Lab Results  Component Value Date   ALT 30 01/15/2016   AST 28 01/15/2016   ALKPHOS 119 01/15/2016   BILITOT 0.6 01/15/2016     Microbiology: Recent Results (from the past 240 hour(s))  Urine culture     Status: Abnormal   Collection Time: 01/14/16  4:05 PM  Result Value Ref Range Status   Specimen Description URINE, CLEAN CATCH  Final   Special Requests NONE  Final   Culture (A)  Final    >=100,000 COLONIES/mL METHICILLIN RESISTANT STAPHYLOCOCCUS AUREUS   Report Status 01/18/2016 FINAL  Final   Organism ID, Bacteria METHICILLIN RESISTANT STAPHYLOCOCCUS AUREUS (A)  Final      Susceptibility   Methicillin resistant staphylococcus aureus - MIC*    CIPROFLOXACIN Value in next row Resistant      RESISTANT>=4    GENTAMICIN Value in next row Sensitive      SENSITIVE<=0.5    NITROFURANTOIN Value in next row Sensitive      SENSITIVE<=16    OXACILLIN Value in next row Resistant      RESISTANT>=4    TETRACYCLINE Value in next row  Sensitive      SENSITIVE<=1    VANCOMYCIN Value in next row Sensitive      SENSITIVE<=0.5    TRIMETH/SULFA Value  in next row Sensitive      SENSITIVE<=10    CLINDAMYCIN Value in next row Sensitive      SENSITIVE<=0.25    RIFAMPIN Value in next row Sensitive      SENSITIVE<=0.5    * >=100,000 COLONIES/mL METHICILLIN RESISTANT STAPHYLOCOCCUS AUREUS  Culture, blood (Routine X 2) w Reflex to ID Panel     Status: Abnormal   Collection Time: 01/14/16  5:25 PM  Result Value Ref Range Status   Specimen Description BLOOD LEFT ANTECUBITAL  Final   Special Requests BOTTLES DRAWN AEROBIC AND ANAEROBIC 5CC  Final   Culture  Setup Time   Final    GRAM POSITIVE COCCI IN CLUSTERS IN BOTH AEROBIC AND ANAEROBIC BOTTLES CRITICAL RESULT CALLED TO, READ BACK BY AND VERIFIED WITH: RN J MCNULTY AT [id number] [id number] MARTINB    Culture (A)  Final    STAPHYLOCOCCUS AUREUS SUSCEPTIBILITIES PERFORMED ON PREVIOUS CULTURE WITHIN THE LAST 5 DAYS. Performed at [facility]    Report Status 01/18/2016 FINAL  Final  Culture, blood (Routine X 2) w Reflex to ID Panel     Status: Abnormal   Collection Time: 01/14/16  9:09 PM  Result Value Ref Range Status   Specimen Description BLOOD RIGHT ARM  Final   Special Requests BOTTLES DRAWN AEROBIC AND ANAEROBIC 5CC  Final   Culture  Setup Time   Final    GRAM POSITIVE COCCI IN CLUSTERS IN BOTH AEROBIC AND ANAEROBIC BOTTLES CRITICAL RESULT CALLED TO, READ BACK BY AND VERIFIED WITH: TERRI GREEN PHARMD 01/16/16 AT 1924 BY [place] Performed at [place] (A)  Final   Report Status 01/18/2016 FINAL  Final   Organism ID, Bacteria METHICILLIN RESISTANT STAPHYLOCOCCUS AUREUS  Final      Susceptibility   Methicillin resistant staphylococcus aureus - MIC*    CIPROFLOXACIN >=8 RESISTANT Resistant     ERYTHROMYCIN >=8 RESISTANT Resistant     GENTAMICIN <=0.5 SENSITIVE Sensitive     OXACILLIN >=4 RESISTANT  Resistant     TETRACYCLINE <=1 SENSITIVE Sensitive     VANCOMYCIN <=0.5 SENSITIVE Sensitive     TRIMETH/SULFA <=10 SENSITIVE Sensitive     CLINDAMYCIN <=0.25 SENSITIVE Sensitive     RIFAMPIN <=0.5 SENSITIVE Sensitive     Inducible Clindamycin NEGATIVE Sensitive     * METHICILLIN RESISTANT STAPHYLOCOCCUS AUREUS  Blood Culture ID Panel (Reflexed)     Status: Abnormal   Collection Time: 01/14/16  9:09 PM  Result Value Ref Range Status   Enterococcus species NOT DETECTED NOT DETECTED Final   Vancomycin resistance NOT DETECTED NOT DETECTED Final   Listeria monocytogenes NOT DETECTED NOT DETECTED Final   Staphylococcus species NOT DETECTED NOT DETECTED Final   Staphylococcus aureus DETECTED (A) NOT DETECTED Final    Comment: CRITICAL RESULT CALLED TO, READ BACK BY AND VERIFIED WITH: TERRI GREEN PHARMD 01/16/16 AT 1924 BY [place]    Methicillin resistance DETECTED (A) NOT DETECTED Final    Comment: CRITICAL RESULT CALLED TO, READ BACK BY AND VERIFIED WITH: TERRI GREEN PHARMD 01/16/16 AT 1924 BY [place] Mosley    Streptococcus species NOT DETECTED NOT DETECTED Final   Streptococcus agalactiae NOT DETECTED NOT DETECTED Final   Streptococcus pneumoniae NOT DETECTED NOT DETECTED Final   Streptococcus pyogenes NOT DETECTED NOT DETECTED Final   Acinetobacter baumannii NOT DETECTED NOT DETECTED Final   Enterobacteriaceae species NOT DETECTED NOT DETECTED Final   Enterobacter cloacae complex NOT DETECTED NOT DETECTED Final  Escherichia coli NOT DETECTED NOT DETECTED Final   Klebsiella oxytoca NOT DETECTED NOT DETECTED Final   Klebsiella pneumoniae NOT DETECTED NOT DETECTED Final   Proteus species NOT DETECTED NOT DETECTED Final   Serratia marcescens NOT DETECTED NOT DETECTED Final   Carbapenem resistance NOT DETECTED NOT DETECTED Final   Haemophilus influenzae NOT DETECTED NOT DETECTED Final   Neisseria meningitidis NOT DETECTED NOT DETECTED Final   Pseudomonas aeruginosa NOT DETECTED NOT DETECTED  Final   Candida albicans NOT DETECTED NOT DETECTED Final   Candida glabrata NOT DETECTED NOT DETECTED Final   Candida krusei NOT DETECTED NOT DETECTED Final   Candida parapsilosis NOT DETECTED NOT DETECTED Final   Candida tropicalis NOT DETECTED NOT DETECTED Final    Comment: Performed at [facility]  MRSA PCR Screening     Status: Abnormal   Collection Time: 01/14/16 10:42 PM  Result Value Ref Range Status   MRSA by PCR POSITIVE (A) NEGATIVE Final    Comment:        The GeneXpert MRSA Assay (FDA approved for NASAL specimens only), is one component of a comprehensive MRSA colonization surveillance program. It is not intended to diagnose MRSA infection nor to guide or monitor treatment for MRSA infections. RESULT CALLED TO, READ BACK BY AND VERIFIED WITH: A LAMB RN [id number] 01/15/16 A NAVARRO   Culture, blood (Routine X 2) w Reflex to ID Panel     Status: None   Collection Time: 01/16/16  9:50 PM  Result Value Ref Range Status   Specimen Description BLOOD RIGHT ANTECUBITAL  Final   Special Requests BOTTLES DRAWN AEROBIC AND ANAEROBIC 10CC  Final   Culture   Final    NO GROWTH 5 DAYS Performed at [facility]    Report Status 01/22/2016 FINAL  Final  Culture, blood (Routine X 2) w Reflex to ID Panel     Status: None   Collection Time: 01/16/16  9:51 PM  Result Value Ref Range Status   Specimen Description BLOOD RIGHT ARM  Final   Special Requests BOTTLES DRAWN AEROBIC ONLY 5CC  Final   Culture   Final    NO GROWTH 5 DAYS Performed at [facility]    Report Status 01/22/2016 FINAL  Final     ASSESSMENT: He is improving on therapy for MRSA bacteremia and UTI. I plan on continuing vancomycin for a total of 3 weeks through a 02/05/2016.  PLAN: 1. Continue vancomycin through 02/05/2016 2. I will arrange followup in my clinic  Michel Bickers, MD [facility] for [place] [facility] [phone] pager   [phone] cell 01/24/2016,  10:14 AM

## 2016-01-24 NOTE — Progress Notes (Signed)
PROGRESS NOTE    [name]  [id number] DOB: 1925-03-07 DOA: 01/14/2016 PCP: No primary care provider on file.  Brief Narrative: [name] is an 82 year old with history of dyslipidemia, hypertension, atrial fibrillation, coronary artery disease, medical hematuria hematuria. He is on  warfarin for atrial fibrillation. Workup  CT scan of abdomen and pelvis significant for sigmoid mass and liver metastasis. [facility] for flexible sigmoidoscopy  on Wednesday, 01/19/2016, difficult for partially obstructed sigmoid mass, biopsy showing at least high-grade glandular dysplasia, thought to be by GI superficial/sampling error, finding most likely related to metastatic colon cancer, in by palliative medicine, no plan for him with therapy or further workup, hospice when appropriate. As well workup significant for MRSA bacteremia secondary to UTI, seen by ID, will need total of 3 weeks treatment tell 02/05/2016 on IV vancomycin, PICC line to be inserted.  Assessment & Plan:   Principal Problem:   MRSA bacteremia Active Problems:   Essential hypertension   UTI (lower urinary tract infection)   CAD (coronary artery disease)   Chronic systolic CHF (congestive heart failure) (HCC)   Long term (current) use of anticoagulants   Pressure ulcer   Dehydration   Acute renal failure (ARF) (HCC)   Hematuria   Chronic atrial fibrillation (HCC)   Metastasis to liver (HCC)   Sepsis ([place])   Acute kidney injury ([place])   Colonic mass   Weakness of both lower extremities   Bilateral foot-drop   Lewy body dementia without behavioral disturbance   Goals of care, counseling/discussion   Encounter for palliative care   Bacteremia  Suspected metastatic colon cancer. - last colonoscopy in 2007 at which time had removal of malignant polyp. Surveillance exam later that year unremarkable. - CT scan of abdomen and pelvis performed in the emergency room showed a 4.3 x 2.7 cm mass involving sigmoid colon along with  liver lesions that could represent metastatic disease. - Status post sigmoidoscopy 5/31, with finding of partially obstructing sigmoid mass. - Pathology results came back as at least high-grade glandular dysplasia, ideas were discussed with Dr. Amedeo Plenty, he reports a superficial sample, most likely sample error, and he thinks the partially obstructing mass is related to malignancy.  MRSA bacteremia - Cultures on 5/26 is growing MRSA - urine is growing Staphylococcus aureus and I am suspecting this to be his source of infection. -Transthoracic echocardiogram performed on 01/17/2016 showing ejection fraction of [id number], grade 2 diastolic dysfunction, without evidence of vegetations. -Continue IV vancomycin. Had been on IV ceftriaxone which was discontinued. -Repeat blood cultures on 01/16/2016 with no growth to date. -Continue with IV vancomycin until 02/05/2016   Hematuria. -Initially presented with complaints of hematuria. He was anticoagulated with warfarin presented with INR of 2.76 and PTT of 28.8. -CT showed left renal pelvic stone without obstructive changes. -Urine analysis consistent with urinary tract infection. -Initially he was treated with ceftriaxone however urine cultures growing methicillin-resistant Staphylococcus aureus, antibiotic regimen changed to IV vancomycin. Urine clearing up.  History of atrial fibrillation -CHADSVASC score 5 -Continue amiodarone 100 mg by mouth daily - Has been anticoagulated with warfarin, discussed with daughter, giving for poor prognosis prognosis, diagnosis of stage IV cancer no further workup and this point, will discontinue anticoagulation to minimize discomfort for patient and labs checked.   Dyslipidemia. -Continue pravastatin 80 mg by mouth daily  Acute kidney injury. -Resolved with IV fluid resuscitation  Acute blood loss anemia. -Likely secondary to hematuria and possibly colon cancer. Lab work as revealed a downward  trend in his  hemoglobin to 8.0 from 9.6 3 days ago. -He was transfuse 1 unit of packed red blood cells on 01/16/2016 Hemoglobin now at 9.7.  MRSA Urinary tract infection. -Urinalysis revealed the presence of many bacteria with leukocytes. -Urine cultures now growing methicillin-resistant Staphylococcus aureus as blood cultures reported by microbiology to be growing methicillin-resistant Staphylococcus aureus -Continue IV vancomycin.   Sepsis -Present on admission, evidenced by a white count of 29,800, blood cultures now returning positive for methicillin-resistant Staphylococcus aureus, acute kidney injury. -The source of infection likely urinary tract given urine cultures growing Staphylococcus aureus as well. -White count trending down and has normalized, he was started on IV vancomycin. Repeat blood cultures negative.  Goals of care: Recent would like to continue with IV antibiotics, treat his bacteremia, but they do not wish for any further workup or treatment for his cancer.  DVT prophylaxis: Heparin Code Status: DNR Family Communication: Updated patient. No family at bedside.  Disposition Plan: Need SNF placement vs Home with [facility] hopefully tomorrow.   Consultants:   GI  ID  Palliative medicine   Procedures:   PICC line 01/23/2016  Antimicrobials:   Ceftriaxone 1 g IV every 24 hours discontinued on 01/17/2016  Vancomycin started on 01/17/2016  Subjective: Patient alert. Denies chest pain. No shortness of breath.States family deciding on SNF vs Home with Home Health vs hospice.  Objective: Filed Vitals:   01/23/16 0625 01/23/16 1515 01/23/16 2058 01/24/16 0528  BP: 148/61 142/70 135/58 138/62  Pulse: 57 56 63 61  Temp: 98.2 F (36.8 C) 98.7 F (37.1 C) 98.8 F (37.1 C) 98 F (36.7 C)  TempSrc: Oral Oral Oral Oral  Resp: 20 20 20 19   Height:      Weight: 75.7 kg (166 lb 14.2 oz)     SpO2: 97% 97% 98% 99%    Intake/Output Summary (Last 24 hours) at 01/24/16 1050 Last  data filed at 01/24/16 1020  Gross per 24 hour  Intake    480 ml  Output   1650 ml  Net  -1170 ml   Filed Weights   01/21/16 0652 01/22/16 0500 01/23/16 0625  Weight: 78 kg (171 lb 15.3 oz) 77.3 kg (170 lb 6.7 oz) 75.7 kg (166 lb 14.2 oz)    Examination:  General exam: Elderly gentleman, no acute distress Respiratory system: Clear to auscultation anterior lung fields. Respiratory effort normal. Cardiovascular system: S1 & S2 heard, RRR. No JVD, murmurs, rubs, gallops or clicks. No pedal edema. Gastrointestinal system: Abdomen is nondistended, soft and nontender. No organomegaly or masses felt. Normal bowel sounds heard. Central nervous system: Alert and oriented. No focal neurological deficits. Extremities: Symmetric 5 x 5 power. Skin: No rashes, lesions or ulcers     Data Reviewed: I have personally reviewed following labs and imaging studies  CBC:  Recent Labs Lab 01/20/16 0622 01/21/16 0433 01/22/16 0441 01/23/16 0433 01/24/16 0500  WBC 8.8 7.9 8.9 7.8 9.1  HGB 9.9* 9.6* 9.7* 9.7* 9.7*  HCT 30.9* 30.2* 30.5* 30.3* 30.3*  MCV 91.2 89.6 92.4 92.1 90.2  PLT 179 178 207 194 0000000   Basic Metabolic Panel:  Recent Labs Lab 01/19/16 0452 01/20/16 0622 01/22/16 0441 01/23/16 0433 01/24/16 0500  NA 141 138 139 138 138  K 3.8 4.0 3.9 3.7 3.7  CL 109 107 100* 106 104  CO2 26 25 27 26 28   GLUCOSE 93 90 90 91 86  BUN 6 6 6 6 7   CREATININE 0.82 0.83 0.84  0.83 0.87  CALCIUM 8.4* 8.4* 8.8* 8.5* 8.6*   GFR: Estimated Creatinine Clearance: 61.6 mL/min (by C-G formula based on Cr of 0.87). Liver Function Tests: No results for input(s): AST, ALT, ALKPHOS, BILITOT, PROT, ALBUMIN in the last 168 hours. No results for input(s): LIPASE, AMYLASE in the last 168 hours. No results for input(s): AMMONIA in the last 168 hours. Coagulation Profile:  Recent Labs Lab 01/18/16 0431 01/20/16 0622  INR 1.88* 1.58*   Cardiac Enzymes: No results for input(s): CKTOTAL, CKMB,  CKMBINDEX, TROPONINI in the last 168 hours. BNP (last 3 results) No results for input(s): PROBNP in the last 8760 hours. HbA1C: No results for input(s): HGBA1C in the last 72 hours. CBG: No results for input(s): GLUCAP in the last 168 hours. Lipid Profile: No results for input(s): CHOL, HDL, LDLCALC, TRIG, CHOLHDL, LDLDIRECT in the last 72 hours. Thyroid Function Tests: No results for input(s): TSH, T4TOTAL, FREET4, T3FREE, THYROIDAB in the last 72 hours. Anemia Panel: No results for input(s): VITAMINB12, FOLATE, FERRITIN, TIBC, IRON, RETICCTPCT in the last 72 hours. Sepsis Labs: No results for input(s): PROCALCITON, LATICACIDVEN in the last 168 hours.  Recent Results (from the past 240 hour(s))  Urine culture     Status: Abnormal   Collection Time: 01/14/16  4:05 PM  Result Value Ref Range Status   Specimen Description URINE, CLEAN CATCH  Final   Special Requests NONE  Final   Culture (A)  Final    >=100,000 COLONIES/mL METHICILLIN RESISTANT STAPHYLOCOCCUS AUREUS   Report Status 01/18/2016 FINAL  Final   Organism ID, Bacteria METHICILLIN RESISTANT STAPHYLOCOCCUS AUREUS (A)  Final      Susceptibility   Methicillin resistant staphylococcus aureus - MIC*    CIPROFLOXACIN Value in next row Resistant      RESISTANT>=4    GENTAMICIN Value in next row Sensitive      SENSITIVE<=0.5    NITROFURANTOIN Value in next row Sensitive      SENSITIVE<=16    OXACILLIN Value in next row Resistant      RESISTANT>=4    TETRACYCLINE Value in next row Sensitive      SENSITIVE<=1    VANCOMYCIN Value in next row Sensitive      SENSITIVE<=0.5    TRIMETH/SULFA Value in next row Sensitive      SENSITIVE<=10    CLINDAMYCIN Value in next row Sensitive      SENSITIVE<=0.25    RIFAMPIN Value in next row Sensitive      SENSITIVE<=0.5    * >=100,000 COLONIES/mL METHICILLIN RESISTANT STAPHYLOCOCCUS AUREUS  Culture, blood (Routine X 2) w Reflex to ID Panel     Status: Abnormal   Collection Time:  01/14/16  5:25 PM  Result Value Ref Range Status   Specimen Description BLOOD LEFT ANTECUBITAL  Final   Special Requests BOTTLES DRAWN AEROBIC AND ANAEROBIC 5CC  Final   Culture  Setup Time   Final    GRAM POSITIVE COCCI IN CLUSTERS IN BOTH AEROBIC AND ANAEROBIC BOTTLES CRITICAL RESULT CALLED TO, READ BACK BY AND VERIFIED WITH: RN J MCNULTY AT 2103 [id number] MARTINB    Culture (A)  Final    STAPHYLOCOCCUS AUREUS SUSCEPTIBILITIES PERFORMED ON PREVIOUS CULTURE WITHIN THE LAST 5 DAYS. Performed at [facility]    Report Status 01/18/2016 FINAL  Final  Culture, blood (Routine X 2) w Reflex to ID Panel     Status: Abnormal   Collection Time: 01/14/16  9:09 PM  Result Value Ref Range Status   Specimen  Description BLOOD RIGHT ARM  Final   Special Requests BOTTLES DRAWN AEROBIC AND ANAEROBIC 5CC  Final   Culture  Setup Time   Final    GRAM POSITIVE COCCI IN CLUSTERS IN BOTH AEROBIC AND ANAEROBIC BOTTLES CRITICAL RESULT CALLED TO, READ BACK BY AND VERIFIED WITH: TERRI GREEN PHARMD 01/16/16 AT 1924 BY [place] Performed at [place] (A)  Final   Report Status 01/18/2016 FINAL  Final   Organism ID, Bacteria METHICILLIN RESISTANT STAPHYLOCOCCUS AUREUS  Final      Susceptibility   Methicillin resistant staphylococcus aureus - MIC*    CIPROFLOXACIN >=8 RESISTANT Resistant     ERYTHROMYCIN >=8 RESISTANT Resistant     GENTAMICIN <=0.5 SENSITIVE Sensitive     OXACILLIN >=4 RESISTANT Resistant     TETRACYCLINE <=1 SENSITIVE Sensitive     VANCOMYCIN <=0.5 SENSITIVE Sensitive     TRIMETH/SULFA <=10 SENSITIVE Sensitive     CLINDAMYCIN <=0.25 SENSITIVE Sensitive     RIFAMPIN <=0.5 SENSITIVE Sensitive     Inducible Clindamycin NEGATIVE Sensitive     * METHICILLIN RESISTANT STAPHYLOCOCCUS AUREUS  Blood Culture ID Panel (Reflexed)     Status: Abnormal   Collection Time: 01/14/16  9:09 PM  Result Value Ref Range Status    Enterococcus species NOT DETECTED NOT DETECTED Final   Vancomycin resistance NOT DETECTED NOT DETECTED Final   Listeria monocytogenes NOT DETECTED NOT DETECTED Final   Staphylococcus species NOT DETECTED NOT DETECTED Final   Staphylococcus aureus DETECTED (A) NOT DETECTED Final    Comment: CRITICAL RESULT CALLED TO, READ BACK BY AND VERIFIED WITH: TERRI GREEN PHARMD 01/16/16 AT 1924 BY [place]    Methicillin resistance DETECTED (A) NOT DETECTED Final    Comment: CRITICAL RESULT CALLED TO, READ BACK BY AND VERIFIED WITH: TERRI GREEN PHARMD 01/16/16 AT 1924 BY [place]    Streptococcus species NOT DETECTED NOT DETECTED Final   Streptococcus agalactiae NOT DETECTED NOT DETECTED Final   Streptococcus pneumoniae NOT DETECTED NOT DETECTED Final   Streptococcus pyogenes NOT DETECTED NOT DETECTED Final   Acinetobacter baumannii NOT DETECTED NOT DETECTED Final   Enterobacteriaceae species NOT DETECTED NOT DETECTED Final   Enterobacter cloacae complex NOT DETECTED NOT DETECTED Final   Escherichia coli NOT DETECTED NOT DETECTED Final   Klebsiella oxytoca NOT DETECTED NOT DETECTED Final   Klebsiella pneumoniae NOT DETECTED NOT DETECTED Final   Proteus species NOT DETECTED NOT DETECTED Final   Serratia marcescens NOT DETECTED NOT DETECTED Final   Carbapenem resistance NOT DETECTED NOT DETECTED Final   Haemophilus influenzae NOT DETECTED NOT DETECTED Final   Neisseria meningitidis NOT DETECTED NOT DETECTED Final   Pseudomonas aeruginosa NOT DETECTED NOT DETECTED Final   Candida albicans NOT DETECTED NOT DETECTED Final   Candida glabrata NOT DETECTED NOT DETECTED Final   Candida krusei NOT DETECTED NOT DETECTED Final   Candida parapsilosis NOT DETECTED NOT DETECTED Final   Candida tropicalis NOT DETECTED NOT DETECTED Final    Comment: Performed at [facility]  MRSA PCR Screening     Status: Abnormal   Collection Time: 01/14/16 10:42 PM  Result Value Ref Range Status   MRSA by PCR  POSITIVE (A) NEGATIVE Final    Comment:        The GeneXpert MRSA Assay (FDA approved for NASAL specimens only), is one component of a comprehensive MRSA colonization surveillance program. It is not intended to diagnose MRSA infection nor to guide or monitor treatment  for MRSA infections. RESULT CALLED TO, READ BACK BY AND VERIFIED WITH: A LAMB RN 0138 01/15/16 A NAVARRO   Culture, blood (Routine X 2) w Reflex to ID Panel     Status: None   Collection Time: 01/16/16  9:50 PM  Result Value Ref Range Status   Specimen Description BLOOD RIGHT ANTECUBITAL  Final   Special Requests BOTTLES DRAWN AEROBIC AND ANAEROBIC 10CC  Final   Culture   Final    NO GROWTH 5 DAYS Performed at [facility]    Report Status 01/22/2016 FINAL  Final  Culture, blood (Routine X 2) w Reflex to ID Panel     Status: None   Collection Time: 01/16/16  9:51 PM  Result Value Ref Range Status   Specimen Description BLOOD RIGHT ARM  Final   Special Requests BOTTLES DRAWN AEROBIC ONLY 5CC  Final   Culture   Final    NO GROWTH 5 DAYS Performed at [facility]    Report Status 01/22/2016 FINAL  Final         Radiology Studies: No results found.      Scheduled Meds: . sodium chloride   Intravenous Once  . amiodarone  100 mg Oral Daily  . buPROPion  75 mg Oral BID  . heparin subcutaneous  5,000 Units Subcutaneous Q8H  . mirabegron ER  25 mg Oral Daily  . pantoprazole  40 mg Oral BID AC  . polyethylene glycol-electrolytes  4,000 mL Oral Once  . pravastatin  80 mg Oral Daily  . sodium chloride flush  10-40 mL Intracatheter Q12H  . sodium chloride flush  3 mL Intravenous Q12H  . traZODone  50 mg Oral QHS  . vancomycin  1,000 mg Intravenous Q24H   Continuous Infusions: . sodium chloride 500 mL (01/19/16 1237)  . sodium chloride       LOS: 10 days    Time spent: 35 min    THOMPSON,DANIEL, MD [facility] Pager [phone]  If 7PM-7AM, please contact  night-coverage [email or web address] Password [email or web address] 01/24/2016, 10:50 AM

## 2016-01-24 NOTE — Care Management Important Message (Signed)
Important Message  Patient Details  Name: [name] MRN: [id number] Date of Birth: 1925/01/05   Medicare Important Message Given:  Yes    Camillo Flaming 01/24/2016, 10:29 AMImportant Message  Patient Details  Name: [name] MRN: [id number] Date of Birth: 04/18/1925   Medicare Important Message Given:  Yes    Camillo Flaming 01/24/2016, 10:29 AM

## 2016-01-25 DIAGNOSIS — R29898 Other symptoms and signs involving the musculoskeletal system: Secondary | ICD-10-CM

## 2016-01-25 LAB — BASIC METABOLIC PANEL
ANION GAP: 5 (ref 5–15)
BUN: 8 mg/dL (ref 6–20)
CALCIUM: 8.6 mg/dL — AB (ref 8.9–10.3)
CO2: 28 mmol/L (ref 22–32)
CREATININE: 0.93 mg/dL (ref 0.61–1.24)
Chloride: 104 mmol/L (ref 101–111)
Glucose, Bld: 94 mg/dL (ref 65–99)
Potassium: 3.9 mmol/L (ref 3.5–5.1)
Sodium: 137 mmol/L (ref 135–145)

## 2016-01-25 LAB — VANCOMYCIN, TROUGH: VANCOMYCIN TR: 17 ug/mL (ref 10.0–20.0)

## 2016-01-25 MED ORDER — VANCOMYCIN HCL IN DEXTROSE 1-5 GM/200ML-% IV SOLN: 1000.0000 mg | INTRAVENOUS | Status: AC

## 2016-01-25 MED ORDER — HEPARIN SOD (PORK) LOCK FLUSH 100 UNIT/ML IV SOLN
250.0000 [IU] | INTRAVENOUS | Status: AC | PRN
  Administered 2016-01-25: 250 [IU]

## 2016-01-25 MED ORDER — HYDROCODONE-ACETAMINOPHEN 5-325 MG PO TABS: 1.0000 | ORAL_TABLET | ORAL | Status: AC | PRN

## 2016-01-25 NOTE — Progress Notes (Signed)
[facility] called and will arrive at 1630 to transport pt home.

## 2016-01-25 NOTE — Discharge Summary (Signed)
Physician Discharge Summary  [name] [id number] DOB: 1925-01-17 DOA: 01/14/2016  PCP: [name], MD  Admit date: 01/14/2016 Discharge date: 01/25/2016  Time spent: 65 minutes  Recommendations for Outpatient Follow-up:  1. Follow-up with [name], MD in 1-2 weeks. Patient will likely need to follow-up with palliative care for transition to full hospice after course of IV antibiotics have been completed.   Discharge Diagnoses:  Principal Problem:   MRSA bacteremia Active Problems:   Essential hypertension   UTI (lower urinary tract infection)   CAD (coronary artery disease)   Chronic systolic CHF (congestive heart failure) (HCC)   Long term (current) use of anticoagulants   Pressure ulcer   Dehydration   Acute renal failure (ARF) (HCC)   Hematuria   Chronic atrial fibrillation (HCC)   Metastasis to liver (HCC)   Sepsis (HCC)   Acute kidney injury ([place])   Colonic mass   Weakness of both lower extremities   Bilateral foot-drop   Lewy body dementia without behavioral disturbance   Goals of care, counseling/discussion   Encounter for palliative care   Bacteremia   Discharge Condition: Stable  Diet recommendation: Soft diet  Filed Weights   01/22/16 0500 01/23/16 0625 01/25/16 0440  Weight: 77.3 kg (170 lb 6.7 oz) 75.7 kg (166 lb 14.2 oz) 74.4 kg (164 lb 0.4 oz)    History of present illness:  Per Dr. Henrietta Randolph is a 82 y.o. male with medical history significant of HTN, coronary artery disease, chronic systolic CHF, A. fib on chronic anticoagulation with Coumadin, history of GI bleed and pressure ulcer, chronic indwelling Foley catheter   Presented with hematuria, patient had catheter replacement yesterday on 25th of May No bleeding at that time but started at night. Patient also reported some abdominal discomfort, patient stated he has gas after eating [place] to last night with beans. Family noted decreased urine output. In the middle and  nonhealing most significant abdominal pain but improved after having a bowel movement but morning he continued to have abdominal discomfort that he had vomiting at night. [facility] reccommended to irrigated the catheter again it did not seem to improve. Home health saw them on the day of admission, and felt there was too much bleeding and asked tham to come to ER. Patient denied any chest pain no fever no cough no shortness of breath, family denied blood in stool, he has dark stool but he was on iron supplement.   Regarding pertinent Chronic problems: In March 2017 patient had to be admitted secondary to supratherapeutic INR and evidence of upper GI bleed with EGD showing gastritis and esophagitis his Coumadin was resumed started on twice a day PPI She has history of atrial fibrillation controlled with amiodarone DCCV to NSR in 6/14. Patient has known history of systolic heart failure in 2014 echogram showed EF of 25-30 percent TEE at that time showed possible left AA thrombus. Repeat echogram in October 2014 showed EF of 40% He has known history of CAD: LHC (4/14) with 60% pLAD stenosis. Patient developed post renal obstruction due to BPH resulting in renal failure and had suprapubic Foley placed followed by urology IN ER: MAXIMUM TEMPERATURE 100. Initial blood pressure on arrival 98/51 now improved coronary left 103/57 WBC 29.8 hemoglobin 10.4 which was 277 creatinine up from baseline to 1.81 INR 2.4 lactic acid 2.24  CT of abdomen was done suggested sigmoid neoplasm with possible metastatic disease  UA showing multiple white blood cells and red blood  cells and also many bacteria all suggestive of possible urinary tract infection urine culture were sent.  Hospitalist was called for admission for acute renal failure and dehydration and hematuria with possible UTI   Hospital Course:  Suspected metastatic colon cancer. - last colonoscopy in 2007 at which time had removal of malignant polyp.  Surveillance exam later that year unremarkable. - CT scan of abdomen and pelvis performed in the emergency room showed a 4.3 x 2.7 cm mass involving sigmoid colon along with liver lesions that could represent metastatic disease. GI was consulted. - Status post sigmoidoscopy 5/31, with finding of partially obstructing sigmoid mass. - Pathology results came back as at least high-grade glandular dysplasia, findings were discussed with Dr. Amedeo Plenty, he reported a superficial sample, most likely sample error, and he thinks the partially obstructing mass is related to malignancy. Patient was seen in consultation by palliative care and decision was made by family not to have any further workup or treatment for this cancer. Patient will complete his course of IV antibiotics and family will likely transition to hospice care after that.  MRSA bacteremia - Cultures on 5/26 grew MRSA ID was consulted.. - urine grew Staphylococcus aureus and suspected this to be his source of infection. -Transthoracic echocardiogram performed on 01/17/2016 showed ejection fraction of [id number], grade 2 diastolic dysfunction, without evidence of vegetations. - patient was placed on IV vancomycin. Had been on IV ceftriaxone which was discontinued. -Repeat blood cultures on 01/16/2016 with no growth to date. -Continue with IV vancomycin until 02/05/2016 per ID recommendations.   Hematuria. -Initially presented with complaints of hematuria. He was anticoagulated with warfarin presented with INR of 2.76 and PTT of 28.8. -CT showed left renal pelvic stone without obstructive changes. -Urine analysis consistent with urinary tract infection. -Initially he was treated with ceftriaxone however urine cultures growing methicillin-resistant Staphylococcus aureus, antibiotic regimen changed to IV vancomycin.   History of atrial fibrillation -CHADSVASC score 5 -Continued on amiodarone 100 mg by mouth daily - Had been anticoagulated with  warfarin, discussed with daughter, giving poor prognosis prognosis, diagnosis of stage IV cancer no further workup and this point, discontinued anticoagulation to minimize discomfort for patient and labs checked.  Dyslipidemia. -Continued on home regimen of pravastatin 80 mg by mouth daily  Acute kidney injury. -Resolved with IV fluid resuscitation  Acute blood loss anemia. -Likely secondary to hematuria and possibly colon cancer. Lab work as revealed a downward trend in his hemoglobin to 8.0 from 9.6. -He was transfused 1 unit of packed red blood cells on 01/16/2016 Hemoglobin stabilized at 9.7.  MRSA Urinary tract infection. -Urinalysis revealed the presence of many bacteria with leukocytes. -Urine cultures grew methicillin-resistant Staphylococcus aureus as blood cultures reported by microbiology to be growing methicillin-resistant Staphylococcus aureus -Patient was placed on IV vancomycin. ID followed the patient throughout the hospitalization. Patient will be discharged home on IV vancomycin.   Sepsis -Present on admission, evidenced by a white count of 29,800, blood cultures came back positive for methicillin-resistant Staphylococcus aureus, acute kidney injury. -The source of infection likely urinary tract given urine cultures growing Staphylococcus aureus as well. -White count trended down and has normalized, he was started on IV vancomycin. Repeat blood cultures negative. Patient will be discharged on IV vancomycin for 11 more days to complete a course of antibiotic treatment.  Goals of care: Recent would like to continue with IV antibiotics, treat his bacteremia, but they do not wish for any further workup or treatment for his cancer. Post treatment  of bacteremia patient will likely transition to hospice care.   Procedures:  PICC line 01/23/2016  Flexible sigmoidoscopy 01/19/2016  CT abdomen and pelvis 01/14/2016  Acute abdominal series  01/14/2016  Consultations:  GI  ID  Palliative medicine   Discharge Exam: Filed Vitals:   01/25/16 0440 01/25/16 1404  BP: 129/64 131/70  Pulse: 58 59  Temp: 98.3 F (36.8 C) 98.6 F (37 C)  Resp: 20 17    General: NAD Cardiovascular: RRR Respiratory: CTAB anterior lung fields.  Discharge Instructions   Discharge Instructions    Diet general    Complete by:  As directed   Soft diet.     Discharge instructions    Complete by:  As directed   Follow up with PCP, Dr Fredderick Phenix in 2 weeks. Follow up with Dr Megan Salon in 2-3 weeks, office will call with appointment time.     Increase activity slowly    Complete by:  As directed           Current Discharge Medication List    START taking these medications   Details  HYDROcodone-acetaminophen (NORCO/VICODIN) 5-325 MG tablet Take 1-2 tablets by mouth every 4 (four) hours as needed for moderate pain. Qty: 20 tablet, Refills: 0    vancomycin (VANCOCIN) 1-5 GM/200ML-% SOLN Inject 200 mLs (1,000 mg total) into the vein daily. Take for 11 days, then stop. Qty: 3000 mL, Refills: 0      CONTINUE these medications which have NOT CHANGED   Details  acetaminophen (TYLENOL) 325 MG tablet Take 650 mg by mouth every 6 (six) hours as needed for moderate pain (pain).     amiodarone (PACERONE) 200 MG tablet Take 0.5 tablets (100 mg total) by mouth daily. Qty: 30 tablet, Refills: 2    buPROPion (WELLBUTRIN) 75 MG tablet Take 1 tablet (75 mg total) by mouth 2 (two) times daily. Qty: 60 tablet, Refills: 0    ferrous sulfate 325 (65 FE) MG tablet Take 325 mg by mouth at bedtime.    finasteride (PROSCAR) 5 MG tablet Take 1 tablet (5 mg total) by mouth daily. Qty: 30 tablet, Refills: 0    furosemide (LASIX) 40 MG tablet TAKE 1 TABLET (40 MG TOTAL) BY MOUTH EVERY OTHER DAY. Qty: 15 tablet, Refills: 3    l-methylfolate-B6-B12 (METANX) 3-35-2 MG TABS tablet TAKE 1 TABLET BY MOUTH 2 (TWO) TIMES DAILY. Qty: 60 tablet, Refills: 6     Meth-Hyo-M Bl-Na Phos-Ph Sal (URIBEL PO) Take 1 capsule by mouth daily as needed (bladder spasm).     mirabegron ER (MYRBETRIQ) 25 MG TB24 tablet Take 25 mg by mouth daily.    Multiple Vitamin (MULTIVITAMIN) tablet Take 1 tablet by mouth daily.    pantoprazole (PROTONIX) 40 MG tablet Take 1 tablet (40 mg total) by mouth 2 (two) times daily before a meal. After one month take 1 tablet daily. Qty: 60 tablet, Refills: 1    pravastatin (PRAVACHOL) 80 MG tablet TAKE 1 TABLET (80 MG TOTAL) BY MOUTH DAILY. Qty: 30 tablet, Refills: 3    Probiotic Product (ALIGN) 4 MG CAPS Take 1 capsule by mouth daily.     traZODone (DESYREL) 50 MG tablet Take 50 mg by mouth at bedtime.    zolpidem (AMBIEN) 5 MG tablet Take 2.5 mg by mouth at bedtime as needed for sleep.  Refills: 5      STOP taking these medications     lisinopril (PRINIVIL,ZESTRIL) 5 MG tablet      warfarin (COUMADIN) 2.5 MG  tablet      levofloxacin (LEVAQUIN) 500 MG tablet        Allergies  Allergen Reactions  . Darifenacin Hydrobromide     REACTION: constipation  . Fluoxetine Hcl     REACTION: Very Depressed while taking   Follow-up Information    Follow up with [name], MD. Schedule an appointment as soon as possible for a visit in 1 week.   Specialty:  Family Medicine   Why:  f/u in 1-2 weeks.   Contact information:   [address] [address] [phone]       Follow up with Michel Bickers, MD. Schedule an appointment as soon as possible for a visit in 2 weeks.   Specialty:  Infectious Diseases   Why:  office will call with appointment time.   Contact information:   [address] [place] [address] [phone]        The results of significant diagnostics from this hospitalization (including imaging, microbiology, ancillary and laboratory) are listed below for reference.    Significant Diagnostic Studies: Ct Abdomen Pelvis Wo Contrast  01/14/2016   CLINICAL DATA:  Hematuria EXAM: CT ABDOMEN AND PELVIS WITHOUT CONTRAST TECHNIQUE: Multidetector CT imaging of the abdomen and pelvis was performed following the standard protocol without IV contrast. COMPARISON:  None. FINDINGS: Lung bases demonstrate some mild scarring in the lower lobes. No acute infiltrate is noted. The liver demonstrates a geographic area of decreased attenuation measuring approximately 9 x 7.4 cm in dimension. This is consistent with a neoplasm till proven otherwise and may represent either primary or secondary neoplasm. The gallbladder has been surgically removed. The spleen, adrenal glands and pancreas are within normal limits. The kidneys are well visualized bilaterally. Multiple right renal cysts are seen. The largest of these lies in the upper pole measuring approximately 7.2 cm. A renal pelvic stone is noted on the left which measures approximately 11 mm. No obstructive changes are seen. The bladder is decompressed by Foley catheter. No definitive mass lesion is seen although evaluation is limited. Some air is noted related to the instrumentation. Fecal material is noted throughout the colon. Scattered diverticular changes noted without definitive diverticulitis. The appendix is within normal limits. In the mid sigmoid colon there is a 4.3 by 2.7 cm soft tissue lesion identified. It is best visualized on image number 65 of series 3 and image number 80 of series 4. This is highly suspicious for an underlying colonic neoplasm given the changes in the liver. Diffuse aortoiliac calcifications are seen without aneurysmal dilatation. No other focal abnormality is noted. IMPRESSION: Changes suggestive of a sigmoid neoplasm with associated liver metastatic disease. Further workup is recommended. Left renal pelvic stone without obstructive change. Renal cystic change. Diverticulosis without diverticulitis. Electronically Signed   By: Inez Catalina M.D.   On: 01/14/2016 18:41   Dg Abd Acute  W/chest  01/14/2016  CLINICAL DATA:  Hematuria.  Generalized abdominal pain. EXAM: DG ABDOMEN ACUTE W/ 1V CHEST COMPARISON:  Chest x-ray 10/26/2015. FINDINGS: Heart is borderline in size. No confluent airspace opacities, effusions or edema. Nonobstructive bowel gas pattern. No free air. Prior cholecystectomy. Moderate stool burden throughout the colon. No suspicious calcification. No acute bony abnormality. IMPRESSION: Moderate stool burden in the colon.  No obstruction or free air. Prior cholecystectomy. Borderline heart size.  No active cardiopulmonary disease. Electronically Signed   By: Rolm Baptise M.D.   On: 01/14/2016 15:47    Microbiology: Recent Results (  from the past 240 hour(s))  Culture, blood (Routine X 2) w Reflex to ID Panel     Status: None   Collection Time: 01/16/16  9:50 PM  Result Value Ref Range Status   Specimen Description BLOOD RIGHT ANTECUBITAL  Final   Special Requests BOTTLES DRAWN AEROBIC AND ANAEROBIC 10CC  Final   Culture   Final    NO GROWTH 5 DAYS Performed at [facility]    Report Status 01/22/2016 FINAL  Final  Culture, blood (Routine X 2) w Reflex to ID Panel     Status: None   Collection Time: 01/16/16  9:51 PM  Result Value Ref Range Status   Specimen Description BLOOD RIGHT ARM  Final   Special Requests BOTTLES DRAWN AEROBIC ONLY 5CC  Final   Culture   Final    NO GROWTH 5 DAYS Performed at [facility]    Report Status 01/22/2016 FINAL  Final     Labs: Basic Metabolic Panel:  Recent Labs Lab 01/20/16 0622 01/22/16 0441 01/23/16 0433 01/24/16 0500 01/25/16 1030  NA 138 139 138 138 137  K 4.0 3.9 3.7 3.7 3.9  CL 107 100* 106 104 104  CO2 25 27 26 28 28   GLUCOSE 90 90 91 86 94  BUN 6 6 6 7 8   CREATININE 0.83 0.84 0.83 0.87 0.93  CALCIUM 8.4* 8.8* 8.5* 8.6* 8.6*   Liver Function Tests: No results for input(s): AST, ALT, ALKPHOS, BILITOT, PROT, ALBUMIN in the last 168 hours. No results for input(s): LIPASE, AMYLASE in  the last 168 hours. No results for input(s): AMMONIA in the last 168 hours. CBC:  Recent Labs Lab 01/20/16 0622 01/21/16 0433 01/22/16 0441 01/23/16 0433 01/24/16 0500  WBC 8.8 7.9 8.9 7.8 9.1  HGB 9.9* 9.6* 9.7* 9.7* 9.7*  HCT 30.9* 30.2* 30.5* 30.3* 30.3*  MCV 91.2 89.6 92.4 92.1 90.2  PLT 179 178 207 194 195   Cardiac Enzymes: No results for input(s): CKTOTAL, CKMB, CKMBINDEX, TROPONINI in the last 168 hours. BNP: BNP (last 3 results) No results for input(s): BNP in the last 8760 hours.  ProBNP (last 3 results) No results for input(s): PROBNP in the last 8760 hours.  CBG: No results for input(s): GLUCAP in the last 168 hours.     SignedIrine Seal MD.  [facility] 01/25/2016, 2:16 PM

## 2016-01-25 NOTE — Plan of Care (Signed)
Problem: Food- and Nutrition-Related Knowledge Deficit (NB-1.1) Goal: Nutrition education Formal process to instruct or train a patient/client in a skill or to impart knowledge to help patients/clients voluntarily manage or modify food choices and eating behavior to maintain or improve health. Outcome: Completed/Met Date Met:  01/25/16 Nutrition Education Note  RD consulted for nutrition education regarding SOFT DIET for pt with aspiration risk.  RD "Soft Nutrition Therapy" handout from [employer].  RD discussed importance of soft, moist foods, chopped into small pieces. Encouraged patient to adhere to list of recommended foods, and foods to avoid, to prevent aspiration. Encouraged patient to consume meats with gravies, well cooked vegetables, and soft breads and grains. Patient is to avoid any and all raw fruits and vegetables, small fruits or vegetables, anything with nuts and seeds, and non-tender meats.  Expect fair compliance  Body mass index is 22.24 kg/(m^2). Pt meets criteria for normal weight based on current BMI.   Current diet order is SOFT, patient is consuming approximately 75-100% of meals at this time. Labs and medications reviewed. No further nutrition interventions warranted at this time. RD contact information provided. If additional nutrition issues arise, please re-consult RD   M. , MS, RD LDN Inpatient Clinical Dietitian Pager [phone]        

## 2016-01-25 NOTE — Progress Notes (Signed)
Pharmacy Antibiotic Note  [name] is a 82 y.o. male admitted on 01/14/2016 with MRSA UTI and bacteremia.  Pharmacy has been consulted for Vancomycin dosing.  Today, 01/25/2016: Day # 10/28 vancomycin treatment PICC line placed 6/4 SCr stable (suggest monitoring q48h d/t age) VT = 17  Plan: Continue Vancomycin 1g IV q24h. Measure Vanc trough at steady state. Follow up renal fxn, culture results, and clinical course.    Height: 6' (182.9 cm) Weight: 164 lb 0.4 oz (74.4 kg) IBW/kg (Calculated) : 77.6  Temp (24hrs), Avg:98.6 F (37 C), Min:98.3 F (36.8 C), Max:99.2 F (37.3 C)   Recent Labs Lab 01/19/16 0452  01/20/16 0622 01/21/16 0433 01/21/16 2126 01/22/16 0441 01/22/16 0934 01/23/16 0433 01/24/16 0500  WBC 7.7  --  8.8 7.9  --  8.9  --  7.8 9.1  CREATININE 0.82  --  0.83  --   --  0.84  --  0.83 0.87  VANCOTROUGH  --   < >  --   --  23*  --  18  --   --   < > = values in this interval not displayed.  Estimated Creatinine Clearance: 60.6 mL/min (by C-G formula based on Cr of 0.87).    Allergies  Allergen Reactions  . Darifenacin Hydrobromide     REACTION: constipation  . Fluoxetine Hcl     REACTION: Very Depressed while taking    Antimicrobials this admission: 5/27 CTX>> 5/28 5/28 Vancomycin >>  Dose adjustments this admission: 5/31 Vanc trough (prior to 5th dose) = 19, empirically reduce to 500mg  q12h for concern for accumulation with 4 weeks therapy 6/2 VT (prior to 5th dose) = 23 on 500mg  IV q12h (RN hung dose prior to level being drawn).  Repeat in am 6/3 VT (prior to 6th dose) = 13mcg on 500 q12h, change to 1g q24h 6/6 1100 VT = 17 on 1g q24h (prior to 4th dose)  Microbiology results: 5/26 bcx x2:  MRSA 5/26 ucx: >100K MRSA 5/26 MRSA PCR (+) 5/28 repeat blood cx x2: NGF  Thank you for allowing pharmacy to be a part of this patient's care.  Gretta Arab PharmD, BCPS Pager [phone] 01/25/2016 11:01 AM

## 2016-01-25 NOTE — Progress Notes (Signed)
Daily Progress Note   Patient Name: [name]       Date: 01/25/2016 DOB: 13-Nov-1924  Age: 82 y.o. MRN#: [id number] Attending Physician: Eugenie Filler, MD Primary Care Physician: No primary care provider on file. Admit Date: 01/14/2016  Reason for Consultation/Follow-up: Establishing goals of care  Subjective:  Patient resting comfortably in bed. Hard of hearing but was able to fully participate in discussions.  See below for discussion of conversation.  Length of Stay: 11  Current Medications: Scheduled Meds:  . sodium chloride   Intravenous Once  . amiodarone  100 mg Oral Daily  . buPROPion  75 mg Oral BID  . heparin subcutaneous  5,000 Units Subcutaneous Q8H  . mirabegron ER  25 mg Oral Daily  . pantoprazole  40 mg Oral BID AC  . polyethylene glycol-electrolytes  4,000 mL Oral Once  . pravastatin  80 mg Oral Daily  . sodium chloride flush  10-40 mL Intracatheter Q12H  . sodium chloride flush  3 mL Intravenous Q12H  . traZODone  50 mg Oral QHS  . vancomycin  1,000 mg Intravenous Q24H    Continuous Infusions: . sodium chloride 500 mL (01/19/16 1237)  . sodium chloride      PRN Meds: acetaminophen **OR** acetaminophen, HYDROcodone-acetaminophen, ondansetron **OR** ondansetron (ZOFRAN) IV, sodium chloride flush, zolpidem  Physical Exam         Elderly gentleman resting in bed Hard of hearing Confused Pattern of breathing appears to be comfortable, does not appear to be in any acute respiratory distress Trace edema Abdomen appears nondistended  Vital Signs: BP 129/64 mmHg  Pulse 58  Temp(Src) 98.3 F (36.8 C) (Oral)  Resp 20  Ht 6' (1.829 m)  Wt 74.4 kg (164 lb 0.4 oz)  BMI 22.24 kg/m2  SpO2 94% SpO2: SpO2: 94 % O2 Device: O2 Device: Not Delivered O2  Flow Rate: O2 Flow Rate (L/min): 3 L/min  Intake/output summary:   Intake/Output Summary (Last 24 hours) at 01/25/16 0818 Last data filed at 01/25/16 0435  Gross per 24 hour  Intake    250 ml  Output    350 ml  Net   -100 ml   LBM: Last BM Date: 01/22/16 (smear) Baseline Weight: Weight: 79.379 kg (175 lb) Most recent weight: Weight: 74.4 kg (164 lb 0.4 oz)  Palliative Assessment/Data:    Flowsheet Rows        Most Recent Value   Intake Tab    Referral Department  Hospitalist   Unit at Time of Referral  Oncology Unit   Palliative Care Primary Diagnosis  Cancer   Date Notified  01/18/16   Palliative Care Type  Return patient Palliative Care   Reason for referral  Clarify Goals of Care   Date of Admission  01/14/16   Date first seen by Palliative Care  01/18/16   # of days Palliative referral response time  0 Day(s)   # of days IP prior to Palliative referral  4   Clinical Assessment    Palliative Performance Scale Score  30%   Pain Max last 24 hours  5   Pain Min Last 24 hours  4   Dyspnea Max Last 24 Hours  4   Dyspnea Min Last 24 hours  3   Psychosocial & Spiritual Assessment    Palliative Care Outcomes    Patient/Family meeting held?  Yes   Who was at the meeting?  daughter [name] over the phone.    Palliative Care follow-up planned  Yes, Home      Patient Active Problem List   Diagnosis Date Noted  . Bacteremia   . Encounter for palliative care   . Sepsis ([place]) 01/17/2016  . Acute kidney injury ([place])   . Colonic mass   . MRSA bacteremia   . Weakness of both lower extremities   . Bilateral foot-drop   . Lewy body dementia without behavioral disturbance   . Goals of care, counseling/discussion   . Dehydration 01/14/2016  . Acute renal failure (ARF) ([place]) 01/14/2016  . Hematuria 01/14/2016  . Chronic atrial fibrillation ([place]) 01/14/2016  . Metastasis to liver ([place]) 01/14/2016  . Pressure ulcer 10/27/2015  . GI bleed 10/22/2015  . Upper GI bleed  10/22/2015  . H/O falling 07/06/2014  . Long term (current) use of anticoagulants 02/14/2013  . Foot ulcer ([place]) 02/10/2013  . CAD (coronary artery disease) 01/16/2013  . Chronic systolic CHF (congestive heart failure) ([place]) 01/16/2013  . Atrial fibrillation with RVR ([place]) 12/27/2012  . Acute on chronic systolic heart failure ([place]) 12/27/2012  . AKI (acute kidney injury) ([place]) 12/13/2012  . UTI (lower urinary tract infection) 01/20/2012  . CONSTIPATION 10/12/2010  . DEPRESSION, RECURRENT 07/04/2010  . INTENTION TREMOR 07/04/2010  . INSOMNIA 06/27/2010  . NOCTURIA 06/27/2010  . ABNORMAL ELECTROCARDIOGRAM 04/19/2010  . MENTAL CONFUSION 03/08/2010  . Dizziness and giddiness 03/08/2010  . LACK OF COORDINATION 03/08/2010  . FATIGUE 02/03/2009  . FASTING HYPERGLYCEMIA 02/03/2009  . SKIN CANCER, HX OF 02/03/2009  . HYPERLIPIDEMIA 10/19/2006  . ANEMIA-NOS 10/19/2006  . Essential hypertension 10/19/2006  . COLONIC POLYPS, HX OF 10/19/2006    Palliative Care Assessment & Plan   Assessment: Possible proximal sigmoid colon malignancy, possible liver metastases, questionable high suspicion for skeletal metastases given patient's back discomfort, bilateral lower extremity weakness, further declined functional status.  History of systolic congestive heart failure, atrial fibrillation  Bacteremia in this hospitalization, being followed by infectious disease, antibiotics per infectious disease.  Recommendations/Plan: Family meeting with the patient and patient's daughter [name].  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 He has bacteremia and is being followed  by infectious disease, underwent PICC line placement and plan for abx for next couple of weeks.  We discussed his limited prognosis, decision not to pursue  workup or treatment of malignancy (he has very poor functional status at baseline), and his desire to remain in his home.  We discussed option for home hospice, and he reports having good experience with hospice with his wife who died in 2022-12-04.  He and his daughter have been discussing options for discharge including home with home health, SNF, or home hospice.  He wants to treat the acutely treatable (infection), but understands that long term he has limited prognosis (I suspect weeks to months) due to malignancy.    Goals is to transition home with home health, continue abx through 6/17, and discuss with his OP physician with likely plan to enroll in home hospice once he completes course of abx.   Code Status:    Code Status Orders        Start     Ordered   01/20/16 0855  Do not attempt resuscitation (DNR)   Continuous    Question Answer Comment  In the event of cardiac or respiratory ARREST Do not call a "code blue"   In the event of cardiac or respiratory ARREST Do not perform Intubation, CPR, defibrillation or ACLS   In the event of cardiac or respiratory ARREST Use medication by any route, position, wound care, and other measures to relive pain and suffering. May use oxygen, suction and manual treatment of airway obstruction as needed for comfort.      01/20/16 0854    Code Status History    Date Active Date Inactive Code Status Order ID Comments User Context   01/14/2016 10:10 PM 01/20/2016  8:54 AM Full Code [id number]  Toy Baker, MD Inpatient   10/23/2015 12:52 AM 10/28/2015  3:48 PM Full Code [id number]  Elmarie Shiley, MD ED    Advance Directive Documentation        Most Recent Value   Type of Advance Directive  Healthcare Power of Attorney, Living will   Pre-existing out of facility DNR order (yellow form or pink MOST form)     "MOST" Form in Place?         Prognosis:   < 6 months.   Discharge Planning:  Daughter reports goal is to discharge home with home  health to complete course of antibiotics.  Once abx completed, she is considering enrolling with home hospice support.  She will discuss this further with Dr. Fredderick Phenix (who makes house calls to see patient) once patient is discharged home.  Care plan was discussed with  Daughter [name] and patient.   Thank you for allowing the Palliative Medicine Team to assist in the care of this patient.   Time In: 1510 Time Out: 1600 Total Time 50 Prolonged Time Billed  no       Greater than 50%  of this time was spent counseling and coordinating care related to the above assessment and plan.  Micheline Rough, MD [phone]  Please contact Palliative Medicine Team phone at [phone] for questions and concerns.

## 2016-01-25 NOTE — Progress Notes (Signed)
Report received from Minda Meo, RN. I agree with previous RN's assessment. Will continue to monitor pt and carry out plan of care. Carnella Guadalaj[place] I

## 2016-01-25 NOTE — Progress Notes (Signed)
Patient ID: [name], male   DOB: 02-16-1925, 82 y.o.   MRN: [id number]         [facility]    Date of Admission:  01/14/2016           Day 10 vancomycin  He is resting quietly in bed and denies any current complaints. He has responded well to therapy for MRSA bacteremia and UTI. Will continue vancomycin for 11 more days through 02/05/2016. He will be transitioning to hospice care after he completes antibiotic therapy.  I discussed the option of having him follow-up with me in my clinic but his daughter agrees that this will be quite difficult to arrange. Since he will be observed closely by hospice I will not arrange a follow-up visit but will be available as needed.  I will sign off now but please call if I can be of further assistance.         Michel Bickers, MD [facility] [phone] pager   [phone] cell 08/24/2015, 1:32 PM

## 2016-01-25 NOTE — Progress Notes (Signed)
Spoke with Pt's daughter [name] for second time concerning Home with IV ABX, that [facility] will teach her or someone how to administer IV ABX. Dietitian was asked to come speak with [name] concerning pt's diet. Geo Mat was ordered and delivered to pt's room. PTAR

## 2016-01-26 ENCOUNTER — Ambulatory Visit: Payer: Self-pay | Admitting: Cardiology

## 2016-01-26 DIAGNOSIS — I4891 Unspecified atrial fibrillation: Secondary | ICD-10-CM

## 2016-01-26 DIAGNOSIS — Z7901 Long term (current) use of anticoagulants: Secondary | ICD-10-CM

## 2016-02-09 ENCOUNTER — Telehealth: Payer: Self-pay | Admitting: *Deleted

## 2016-02-09 NOTE — Telephone Encounter (Signed)
[name] at [place]. Zigmund Daniel at [place] calling to see if it is appropriate to pull the patient's PICC as he has completed his IV antibiotics. Patient still with homehealth nursing, plans for transition to hospice care once his PICC is out. Patient is unable to come to clinic. Last labs from 6/12 are available.  Please advise. Landis Gandy, RN

## 2016-02-09 NOTE — Telephone Encounter (Signed)
Yes, please give them an order to have a PICC removed.

## 2016-02-09 NOTE — Telephone Encounter (Signed)
Verbal order given to Kendrick Fries at [facility] to pull PICC per Dr. Megan Salon.

## 2016-02-16 ENCOUNTER — Encounter: Payer: Self-pay | Admitting: Internal Medicine

## 2016-07-14 ENCOUNTER — Other Ambulatory Visit (HOSPITAL_COMMUNITY)
Admission: RE | Admit: 2016-07-14 | Discharge: 2016-07-14 | Disposition: A | Discharge status: Home / Self Care | Payer: Medicare Other | Source: Other Acute Inpatient Hospital | Attending: Geriatric Medicine | Admitting: Geriatric Medicine

## 2016-07-14 DIAGNOSIS — Z79899 Other long term (current) drug therapy: Secondary | ICD-10-CM | POA: Insufficient documentation

## 2016-07-14 LAB — URINE MICROSCOPIC-ADD ON

## 2016-07-14 LAB — URINALYSIS, ROUTINE W REFLEX MICROSCOPIC
BILIRUBIN URINE: NEGATIVE
Glucose, UA: NEGATIVE mg/dL
KETONES UR: NEGATIVE mg/dL
NITRITE: NEGATIVE
PH: 7.5 (ref 5.0–8.0)
PROTEIN: 30 mg/dL — AB
Specific Gravity, Urine: 1.02 (ref 1.005–1.030)

## 2016-07-15 LAB — URINE CULTURE

## 2016-07-21 DEATH — deceased

## 2017-10-06 IMAGING — CR DG CHEST 1V PORT
1 series · 1 of 1 positions shown · non-contrast
Comparison: 10/22/2015 chest radiograph.

CLINICAL DATA: CHF.  GI bleed.

EXAM:
PORTABLE CHEST 1 VIEW

[AP]
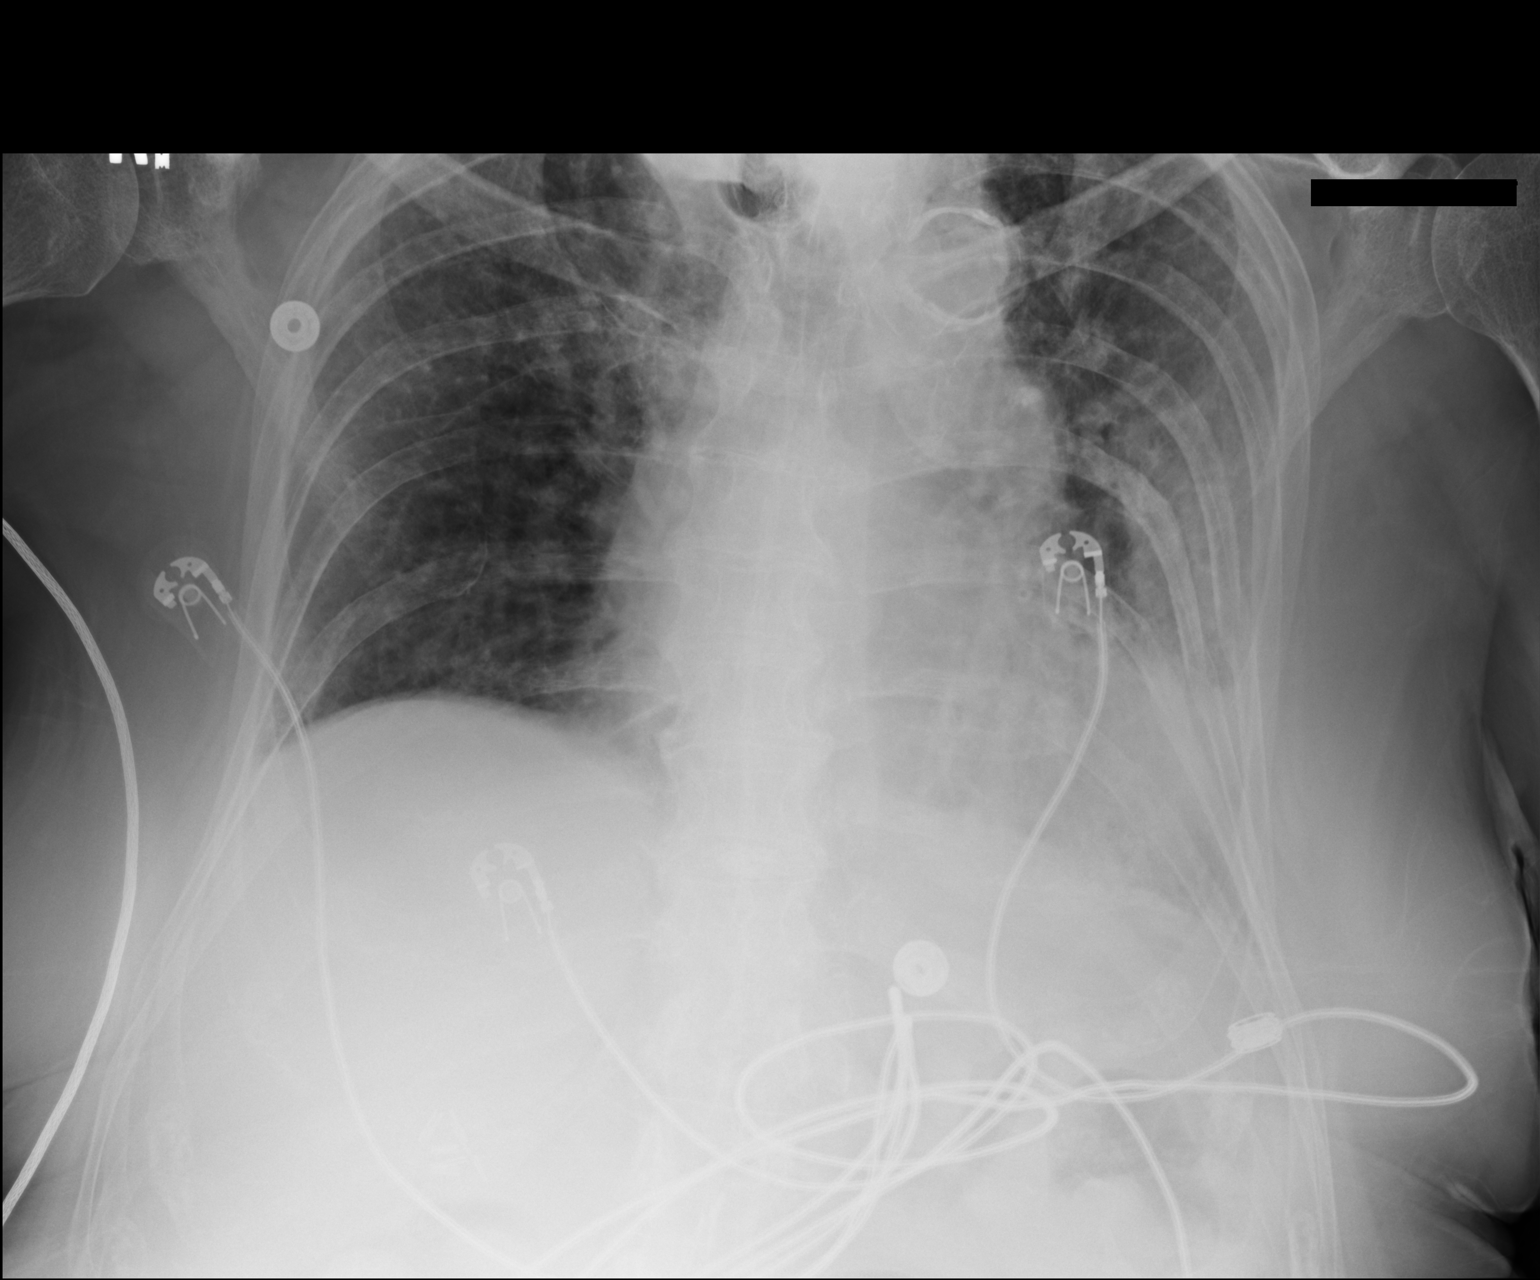

[1 of 1 positions shown; findings below may reference images not displayed]

FINDINGS: Stable cardiomediastinal silhouette with mild cardiomegaly. No
pneumothorax. No pleural effusion. Low lung volumes. Mild pulmonary
edema. Curvilinear opacities at the left lung base, favor
atelectasis.
IMPRESSION: 1. Mild cardiomegaly and mild pulmonary edema, in keeping with mild
congestive heart failure.
2. Curvilinear left basilar lung opacities, favor atelectasis.
